# Patient Record
Sex: Female | Born: 1940
Health system: Southern US, Community
[De-identification: ages and names within clinical notes are randomized; demographics above are authoritative.]

## PROBLEM LIST (undated history)

## (undated) DIAGNOSIS — I10 Essential (primary) hypertension: Secondary | ICD-10-CM

## (undated) DIAGNOSIS — D751 Secondary polycythemia: Secondary | ICD-10-CM

## (undated) DIAGNOSIS — C189 Malignant neoplasm of colon, unspecified: Secondary | ICD-10-CM

## (undated) DIAGNOSIS — E7800 Pure hypercholesterolemia, unspecified: Secondary | ICD-10-CM

## (undated) DIAGNOSIS — E041 Nontoxic single thyroid nodule: Secondary | ICD-10-CM

## (undated) DIAGNOSIS — J189 Pneumonia, unspecified organism: Secondary | ICD-10-CM

## (undated) DIAGNOSIS — C760 Malignant neoplasm of head, face and neck: Secondary | ICD-10-CM

## (undated) DIAGNOSIS — D122 Benign neoplasm of ascending colon: Secondary | ICD-10-CM

## (undated) DIAGNOSIS — Z7189 Other specified counseling: Secondary | ICD-10-CM

## (undated) DIAGNOSIS — E785 Hyperlipidemia, unspecified: Secondary | ICD-10-CM

## (undated) DIAGNOSIS — K921 Melena: Secondary | ICD-10-CM

## (undated) DIAGNOSIS — C4442 Squamous cell carcinoma of skin of scalp and neck: Secondary | ICD-10-CM

## (undated) DIAGNOSIS — E042 Nontoxic multinodular goiter: Secondary | ICD-10-CM

## (undated) HISTORY — DX: Melena: K92.1

## (undated) HISTORY — DX: Pure hypercholesterolemia, unspecified: E78.00

## (undated) HISTORY — DX: Hyperlipidemia, unspecified: E78.5

## (undated) HISTORY — DX: Other specified counseling: Z71.89

## (undated) HISTORY — DX: Malignant neoplasm of colon, unspecified: C18.9

## (undated) HISTORY — PX: ABDOMINAL HYSTERECTOMY: SHX81

## (undated) HISTORY — DX: Nontoxic multinodular goiter: E04.2

## (undated) HISTORY — DX: Essential (primary) hypertension: I10

## (undated) HISTORY — DX: Secondary polycythemia: D75.1

## (undated) HISTORY — DX: Nontoxic single thyroid nodule: E04.1

## (undated) HISTORY — DX: Pneumonia, unspecified organism: J18.9

## (undated) HISTORY — DX: Benign neoplasm of ascending colon: D12.2

---

## 2005-03-24 ENCOUNTER — Ambulatory Visit: Payer: Self-pay | Admitting: Unknown Physician Specialty

## 2006-01-13 ENCOUNTER — Ambulatory Visit: Payer: Self-pay | Admitting: Gastroenterology

## 2006-05-12 ENCOUNTER — Ambulatory Visit: Payer: Self-pay | Admitting: Unknown Physician Specialty

## 2007-05-18 ENCOUNTER — Ambulatory Visit: Payer: Self-pay | Admitting: Unknown Physician Specialty

## 2008-01-28 ENCOUNTER — Ambulatory Visit: Payer: Self-pay | Admitting: Ophthalmology

## 2008-02-01 ENCOUNTER — Ambulatory Visit: Payer: Self-pay | Admitting: Unknown Physician Specialty

## 2008-02-16 ENCOUNTER — Ambulatory Visit: Payer: Self-pay | Admitting: Unknown Physician Specialty

## 2008-04-13 ENCOUNTER — Encounter: Admission: RE | Admit: 2008-04-13 | Discharge: 2008-04-13 | Payer: Self-pay | Admitting: Surgery

## 2008-04-13 ENCOUNTER — Encounter (INDEPENDENT_AMBULATORY_CARE_PROVIDER_SITE_OTHER): Payer: Self-pay | Admitting: Interventional Radiology

## 2008-04-13 ENCOUNTER — Other Ambulatory Visit: Admission: RE | Admit: 2008-04-13 | Discharge: 2008-04-13 | Payer: Self-pay | Admitting: Interventional Radiology

## 2008-05-18 ENCOUNTER — Ambulatory Visit: Payer: Self-pay | Admitting: Unknown Physician Specialty

## 2009-06-06 ENCOUNTER — Ambulatory Visit: Payer: Self-pay | Admitting: Unknown Physician Specialty

## 2009-08-18 HISTORY — PX: WRIST FRACTURE SURGERY: SHX121

## 2009-09-05 ENCOUNTER — Ambulatory Visit: Payer: Self-pay | Admitting: Unknown Physician Specialty

## 2010-07-15 ENCOUNTER — Ambulatory Visit: Payer: Self-pay | Admitting: Unknown Physician Specialty

## 2011-04-17 ENCOUNTER — Ambulatory Visit: Payer: Self-pay | Admitting: Gastroenterology

## 2011-07-17 ENCOUNTER — Ambulatory Visit: Payer: Self-pay | Admitting: Unknown Physician Specialty

## 2011-12-15 ENCOUNTER — Telehealth (INDEPENDENT_AMBULATORY_CARE_PROVIDER_SITE_OTHER): Payer: Self-pay | Admitting: Surgery

## 2011-12-22 ENCOUNTER — Other Ambulatory Visit (INDEPENDENT_AMBULATORY_CARE_PROVIDER_SITE_OTHER): Payer: Self-pay

## 2011-12-22 DIAGNOSIS — E041 Nontoxic single thyroid nodule: Secondary | ICD-10-CM

## 2011-12-25 ENCOUNTER — Ambulatory Visit
Admission: RE | Admit: 2011-12-25 | Discharge: 2011-12-25 | Disposition: A | Discharge status: Home / Self Care | Payer: Medicare Other | Source: Ambulatory Visit | Attending: Surgery | Admitting: Surgery

## 2011-12-25 DIAGNOSIS — E041 Nontoxic single thyroid nodule: Secondary | ICD-10-CM

## 2012-01-20 ENCOUNTER — Encounter (INDEPENDENT_AMBULATORY_CARE_PROVIDER_SITE_OTHER): Payer: Self-pay | Admitting: Surgery

## 2012-01-22 ENCOUNTER — Encounter (INDEPENDENT_AMBULATORY_CARE_PROVIDER_SITE_OTHER): Payer: Self-pay | Admitting: Surgery

## 2012-01-22 ENCOUNTER — Ambulatory Visit (INDEPENDENT_AMBULATORY_CARE_PROVIDER_SITE_OTHER): Payer: Medicare Other | Admitting: Surgery

## 2012-01-22 VITALS — BP 124/87 | HR 73 | Temp 97.7°F | Ht 66.0 in | Wt 118.0 lb

## 2012-01-22 DIAGNOSIS — E042 Nontoxic multinodular goiter: Secondary | ICD-10-CM

## 2012-01-22 DIAGNOSIS — E041 Nontoxic single thyroid nodule: Secondary | ICD-10-CM

## 2012-01-22 HISTORY — DX: Nontoxic multinodular goiter: E04.2

## 2012-01-22 NOTE — Progress Notes (Signed)
Visit Diagnoses: 1. Thyroid nodule, uninodular, right     HISTORY: Patient is a 71 year old white female followed for several years with a dominant right thyroid nodule. This has remained relatively stable with only slight interval enlargement. Current ultrasound performed May 2013 shows a 1.6 cm solid inferior right thyroid nodule. Previous fine-needle aspiration biopsy showed benign cytopathology. Patient's thyroid function tests have always been normal. She has never been on thyroid medication. She returns today for followup physical examination and review of her ultrasound results.  PERTINENT REVIEW OF SYSTEMS: Denies tremor. Denies palpitations. Denies dysphagia. Denies dyspnea. Denies mass or discomfort.  EXAM: HEENT: normocephalic; pupils equal and reactive; sclerae clear; dentition good; mucous membranes moist NECK:  Palpable subtle nodule right inferior pole, nontender, mobile with swallowing; no palpable nodule on the left; symmetric on extension; no palpable anterior or posterior cervical lymphadenopathy; no supraclavicular masses; no tenderness CHEST: clear to auscultation bilaterally without rales, rhonchi, or wheezes CARDIAC: regular rate and rhythm without significant murmur; peripheral pulses are full EXT:  non-tender without edema; no deformity NEURO: no gross focal deficits; no sign of tremor   IMPRESSION: Dominant right thyroid nodule, 1.6 cm, clinically stable  PLAN: Patient I reviewed the ultrasound results. We discussed options for management. She agrees with continued observation. We will repeat her ultrasound in one year. If her ultrasound is stable at that time, we may consider going to an examination every other year.  Velora Heckler, MD, FACS General & Endocrine Surgery [facility]

## 2012-07-21 ENCOUNTER — Ambulatory Visit: Payer: Self-pay | Admitting: Unknown Physician Specialty

## 2013-01-03 ENCOUNTER — Telehealth (INDEPENDENT_AMBULATORY_CARE_PROVIDER_SITE_OTHER): Payer: Self-pay | Admitting: General Surgery

## 2013-01-03 NOTE — Telephone Encounter (Signed)
Letter mailed for u/s and June office visit for Dr Dewitt Rota

## 2013-01-06 ENCOUNTER — Telehealth (INDEPENDENT_AMBULATORY_CARE_PROVIDER_SITE_OTHER): Payer: Self-pay | Admitting: General Surgery

## 2013-01-06 NOTE — Telephone Encounter (Signed)
Spoke to patient she is aware of appt at  [address]   01/11/13 2pm

## 2013-01-06 NOTE — Telephone Encounter (Signed)
Patient calling stating she called to set up her thyroid ultrasound and they would not let her set this up. She states she can go anytime in the afternoons. Order printed and given to referral coordinator.

## 2013-01-11 ENCOUNTER — Ambulatory Visit
Admission: RE | Admit: 2013-01-11 | Discharge: 2013-01-11 | Disposition: A | Discharge status: Home / Self Care | Payer: Medicare Other | Source: Ambulatory Visit | Attending: Surgery | Admitting: Surgery

## 2013-01-11 DIAGNOSIS — E041 Nontoxic single thyroid nodule: Secondary | ICD-10-CM

## 2013-01-31 ENCOUNTER — Ambulatory Visit (INDEPENDENT_AMBULATORY_CARE_PROVIDER_SITE_OTHER): Payer: Medicare Other | Admitting: Surgery

## 2013-01-31 ENCOUNTER — Encounter (INDEPENDENT_AMBULATORY_CARE_PROVIDER_SITE_OTHER): Payer: Self-pay | Admitting: Surgery

## 2013-01-31 VITALS — BP 122/72 | HR 76 | Temp 97.0°F | Resp 16 | Ht 65.0 in | Wt 114.0 lb

## 2013-01-31 DIAGNOSIS — E041 Nontoxic single thyroid nodule: Secondary | ICD-10-CM

## 2013-01-31 NOTE — Patient Instructions (Signed)
Thyroid Diseases  Your thyroid is a butterfly-shaped gland in your neck. It is located just above your collarbone. It is one of your endocrine glands, which make hormones. The thyroid helps set your metabolism. Metabolism is how your body gets energy from the foods you eat.   Millions of people have thyroid diseases. Women experience thyroid problems more often than men. In fact, overactive thyroid problems (hyperthyroidism) occur in 1% of all women. If you have a thyroid disease, your body may use energy more slowly or quickly than it should.   Thyroid problems also include an immune disease where your body reacts against your thyroid gland (called thyroiditis). A different problem involves lumps and bumps (called nodules) that develop in the gland. The nodules are usually, but not always, noncancerous.  THE MOST COMMON THYROID PROBLEMS AND CAUSES ARE DISCUSSED BELOW  There are many causes for thyroid problems. Treatment depends upon the exact diagnosis and includes trying to reset your body's metabolism to a normal rate.  Hyperthyroidism  Too much thyroid hormone from an overactive thyroid gland is called hyperthyroidism. In hyperthyroidism, the body's metabolism speeds up. One of the most frequent forms of hyperthyroidism is known as Graves' disease. Graves' disease tends to run in families. Although Graves' is thought to be caused by a problem with the immune system, the exact nature of the genetic problem is unknown.  Hypothyroidism  Too little thyroid hormone from an underactive thyroid gland is called hypothyroidism. In hypothyroidism, the body's metabolism is slowed. Several things can cause this condition. Most causes affect the thyroid gland directly and hurt its ability to make enough hormone.   Rarely, there may be a pituitary gland tumor (located near the base of the brain). The tumor can block the pituitary from producing thyroid-stimulating hormone (TSH). Your body makes TSH to stimulate the thyroid  to work properly. If the pituitary does not make enough TSH, the thyroid fails to make enough hormones needed for good health.  Whether the problem is caused by thyroid conditions or by the pituitary gland, the result is that the thyroid is not making enough hormones. Hypothyroidism causes many physical and mental processes to become sluggish. The body consumes less oxygen and produces less body heat.  Thyroid Nodules  A thyroid nodule is a small swelling or lump in the thyroid gland. They are common. These nodules represent either a growth of thyroid tissue or a fluid-filled cyst. Both form a lump in the thyroid gland. Almost half of all people will have tiny thyroid nodules at some point in their lives. Typically, these are not noticeable until they become large and affect normal thyroid size. Larger nodules that are greater than a half inch across (about 1 centimeter) occur in about 5 percent of people.  Although most nodules are not cancerous, people who have them should seek medical care to rule out cancer. Also, some thyroid nodules may produce too much thyroid hormone or become too large. Large nodules or a large gland can interfere with breathing or swallowing or may cause neck discomfort.  Other problems  Other thyroid problems include cancer and thyroiditis. Thyroiditis is a malfunction of the body's immune system. Normally, the immune system works to defend the body against infection and other problems. When the immune system is not working properly, it may mistakenly attack normal cells, tissues, and organs. Examples of autoimmune diseases are Hashimoto's thyroiditis (which causes low thyroid function) and Graves' disease (which causes excess thyroid function).  SYMPTOMS   Symptoms   vary greatly depending upon the exact type of problem with the thyroid.  Hyperthyroidism-is when your thyroid is too active and makes more thyroid hormone than your body needs. The most common cause is Graves' Disease. Too  much thyroid hormone can cause some or all of the following symptoms:  · Anxiety.  · Irritability.  · Difficulty sleeping.  · Fatigue.  · A rapid or irregular heartbeat.  · A fine tremor of your hands or fingers.  · An increase in perspiration.  · Sensitivity to heat.  · Weight loss, despite normal food intake.  · Brittle hair.  · Enlargement of your thyroid gland (goiter).  · Light menstrual periods.  · Frequent bowel movements.  Graves' disease can specifically cause eye and skin problems. The skin problems involve reddening and swelling of the skin, often on your shins and on the top of your feet. Eye problems can include the following:  · Excess tearing and sensation of grit or sand in either or both eyes.  · Reddened or inflamed eyes.  · Widening of the space between your eyelids.  · Swelling of the lids and tissues around the eyes.  · Light sensitivity.  · Ulcers on the cornea.  · Double vision.  · Limited eye movements.  · Blurred or reduced vision.  Hypothyroidism- is when your thyroid gland is not active enough. This is more common than hyperthyroidism. Symptoms can vary a lot depending of the severity of the hormone deficiency. Symptoms may develop over a long period of time and can include several of the following:  · Fatigue.  · Sluggishness.  · Increased sensitivity to cold.  · Constipation.  · Pale, dry skin.  · A puffy face.  · Hoarse voice.  · High blood cholesterol level.  · Unexplained weight gain.  · Muscle aches, tenderness and stiffness.  · Pain, stiffness or swelling in your joints.  · Muscle weakness.  · Heavier than normal menstrual periods.  · Brittle fingernails and hair.  · Depression.  Thyroid Nodules - most do not cause signs or symptoms. Occasionally, some may become so large that you can feel or even see the swelling at the base of your neck. You may realize a lump or swelling is there when you are shaving or putting on makeup. Men might become aware of a nodule when shirt collars  suddenly feel too tight.  Some nodules produce too much thyroid hormone. This can produce the same symptoms as hyperthyroidism (see above).  Thyroid nodules are seldom cancerous. However, a nodule is more likely to be malignant (cancerous) if it:  · Grows quickly or feels hard.  · Causes you to become hoarse or to have trouble swallowing or breathing.  · Causes enlarged lymph nodes under your jaw or in your neck.  DIAGNOSIS   Because there are so many possible thyroid conditions, your caregiver may ask for a number of tests. They will do this in order to narrow down the exact diagnosis. These tests can include:  · Blood and antibody tests.  · Special thyroid scans using small, safe amounts of radioactive iodine.  · Ultrasound of the thyroid gland (particularly if there is a nodule or lump).  · Biopsy. This is usually done with a special needle. A needle biopsy is a procedure to obtain a sample of cells from the thyroid. The tissue will be tested in a lab and examined under a microscope.  TREATMENT   Treatment depends on the exact diagnosis.    Hyperthyroidism  · Beta-blockers help relieve many of the symptoms.  · Anti-thyroid medications prevent the thyroid from making excess hormones.  · Radioactive iodine treatment can destroy overactive thyroid cells. The iodine can permanently decrease the amount of hormone produced.  · Surgery to remove the thyroid gland.  · Treatments for eye problems that come from Graves' disease also include medications and special eye surgery, if felt to be appropriate.  Hypothyroidism  Thyroid replacement with levothyroxine is the mainstay of treatment. Treatment with thyroid replacement is usually lifelong and will require monitoring and adjustment from time to time.  Thyroid Nodules  · Watchful waiting. If a small nodule causes no symptoms or signs of cancer on biopsy, then no treatment may be chosen at first. Re-exam and re-checking blood tests would be the recommended  follow-up.  · Anti-thyroid medications or radioactive iodine treatment may be recommended if the nodules produce too much thyroid hormone (see Treatment for Hyperthyroidism above).  · Alcohol ablation. Injections of small amounts of ethyl alcohol (ethanol) can cause a non-cancerous nodule to shrink in size.  · Surgery (see Treatment for Hyperthyroidism above).  HOME CARE INSTRUCTIONS   · Take medications as instructed.  · Follow through on recommended testing.  SEEK MEDICAL CARE IF:   · You feel that you are developing symptoms of Hyperthyroidism or Hypothyroidism as described above.  · You develop a new lump/nodule in the neck/thyroid area that you had not noticed before.  · You feel that you are having side effects from medicines prescribed.  · You develop trouble breathing or swallowing.  SEEK IMMEDIATE MEDICAL CARE IF:   · You develop a fever of 102° F (38.9° C) or higher.  · You develop severe sweating.  · You develop palpitations and/or rapid heart beat.  · You develop shortness of breath.  · You develop nausea and vomiting.  · You develop extreme shakiness.  · You develop agitation.  · You develop lightheadedness or have a fainting episode.  Document Released: 06/01/2007 Document Revised: 10/27/2011 Document Reviewed: 06/01/2007  [employer]® Patient Information ©2014 [employer].

## 2013-01-31 NOTE — Progress Notes (Signed)
General Surgery [facility]  Visit Diagnoses: 1. Thyroid nodule, uninodular, right     HISTORY: Patient is a 72 year old female followed in my practice for many years with small thyroid goiter and a right thyroid nodule. This has been followed with sequential ultrasound scanning. Previous fine-needle aspiration had shown benign cytopathology. Thyroid function is followed by her primary care physician. She has not required thyroid hormone supplementation.  Thyroid ultrasound performed 01/11/2013 shows a slightly enlarged thyroid gland with the right lobe measuring 5.2 cm and left lobe measuring 5.6 cm. Dominant nodule in the right inferior pole measures 1.7 cm and is unchanged from prior examination. There is also a 4 mm nodule in the upper pole of the left thyroid lobe without concerning features.  PERTINENT REVIEW OF SYSTEMS: Patient denies any change in self-examination. Rare episodes of dysphagia. No new probable masses.  EXAM: HEENT: normocephalic; pupils equal and reactive; sclerae clear; dentition fair; mucous membranes moist NECK:  Thyroid gland is mildly firm, slightly nodular, without discrete or dominant mass; nodule in right lower pole is soft and difficult to appreciate on physical exam; symmetric on extension; no palpable anterior or posterior cervical lymphadenopathy; no supraclavicular masses; no tenderness CHEST: clear to auscultation bilaterally without rales, rhonchi, or wheezes CARDIAC: regular rate and rhythm without significant murmur; peripheral pulses are full EXT:  non-tender without edema; no deformity NEURO: no gross focal deficits; no sign of tremor   IMPRESSION: #1 small thyroid goiter #2 multiple thyroid nodules, clinically stable  PLAN: Patient has been followed for several years with a stable clinical examination. I would like to repeat her thyroid ultrasound in 2 years. We will make arrangements for this study and for an office visit to  follow that procedure.  Patient will continue to be followed by her primary care physician. They will monitor her TSH level.  Patient will return in 2 years.  Velora Heckler, MD, [facility] Office: [phone]

## 2013-07-22 ENCOUNTER — Ambulatory Visit: Payer: Self-pay | Admitting: Internal Medicine

## 2014-06-13 DIAGNOSIS — E785 Hyperlipidemia, unspecified: Secondary | ICD-10-CM

## 2014-06-13 DIAGNOSIS — I10 Essential (primary) hypertension: Secondary | ICD-10-CM | POA: Insufficient documentation

## 2014-06-13 HISTORY — DX: Hyperlipidemia, unspecified: E78.5

## 2014-06-13 HISTORY — DX: Essential (primary) hypertension: I10

## 2014-07-24 ENCOUNTER — Ambulatory Visit: Payer: Self-pay | Admitting: Internal Medicine

## 2014-11-22 ENCOUNTER — Other Ambulatory Visit: Payer: Self-pay | Admitting: Surgery

## 2014-11-22 DIAGNOSIS — E041 Nontoxic single thyroid nodule: Secondary | ICD-10-CM

## 2014-11-23 ENCOUNTER — Other Ambulatory Visit: Payer: Self-pay | Admitting: Surgery

## 2014-11-23 DIAGNOSIS — E041 Nontoxic single thyroid nodule: Secondary | ICD-10-CM

## 2014-12-14 DIAGNOSIS — E041 Nontoxic single thyroid nodule: Secondary | ICD-10-CM | POA: Insufficient documentation

## 2014-12-26 ENCOUNTER — Inpatient Hospital Stay: Payer: Commercial Managed Care - HMO | Attending: Internal Medicine | Admitting: Internal Medicine

## 2014-12-26 ENCOUNTER — Inpatient Hospital Stay: Payer: Commercial Managed Care - HMO | Admitting: *Deleted

## 2014-12-26 VITALS — BP 165/78 | HR 69 | Temp 96.7°F | Ht 64.0 in | Wt 118.2 lb

## 2014-12-26 DIAGNOSIS — Z79899 Other long term (current) drug therapy: Secondary | ICD-10-CM | POA: Insufficient documentation

## 2014-12-26 DIAGNOSIS — R5383 Other fatigue: Secondary | ICD-10-CM | POA: Insufficient documentation

## 2014-12-26 DIAGNOSIS — M810 Age-related osteoporosis without current pathological fracture: Secondary | ICD-10-CM | POA: Diagnosis not present

## 2014-12-26 DIAGNOSIS — E785 Hyperlipidemia, unspecified: Secondary | ICD-10-CM | POA: Diagnosis not present

## 2014-12-26 DIAGNOSIS — D751 Secondary polycythemia: Secondary | ICD-10-CM

## 2014-12-26 DIAGNOSIS — Z87891 Personal history of nicotine dependence: Secondary | ICD-10-CM | POA: Insufficient documentation

## 2014-12-26 DIAGNOSIS — R531 Weakness: Secondary | ICD-10-CM | POA: Diagnosis not present

## 2014-12-26 DIAGNOSIS — I10 Essential (primary) hypertension: Secondary | ICD-10-CM | POA: Insufficient documentation

## 2014-12-26 DIAGNOSIS — J449 Chronic obstructive pulmonary disease, unspecified: Secondary | ICD-10-CM | POA: Insufficient documentation

## 2014-12-26 HISTORY — DX: Secondary polycythemia: D75.1

## 2014-12-26 LAB — IRON AND TIBC
IRON: 63 ug/dL (ref 28–170)
Saturation Ratios: 20 % (ref 10.4–31.8)
TIBC: 311 ug/dL (ref 250–450)
UIBC: 248 ug/dL

## 2014-12-26 LAB — CBC
HEMATOCRIT: 45.5 % (ref 35.0–47.0)
HEMOGLOBIN: 14.8 g/dL (ref 12.0–16.0)
MCH: 30 pg (ref 26.0–34.0)
MCHC: 32.5 g/dL (ref 32.0–36.0)
MCV: 92.2 fL (ref 80.0–100.0)
PLATELETS: 214 10*3/uL (ref 150–440)
RBC: 4.93 MIL/uL (ref 3.80–5.20)
RDW: 12.4 % (ref 11.5–14.5)
WBC: 9.4 10*3/uL (ref 3.6–11.0)

## 2014-12-26 LAB — FERRITIN: FERRITIN: 41 ng/mL (ref 11–307)

## 2014-12-26 NOTE — Progress Notes (Signed)
Patient is referred here by Dr. Candiss Norse for elevated hematocrit. Patient states that she feels dizzy sometimes, especially if she has been standing for a long time.

## 2014-12-27 ENCOUNTER — Other Ambulatory Visit: Payer: Self-pay

## 2014-12-27 ENCOUNTER — Other Ambulatory Visit: Payer: Self-pay | Admitting: Internal Medicine

## 2014-12-27 ENCOUNTER — Other Ambulatory Visit: Payer: Self-pay | Admitting: *Deleted

## 2014-12-27 ENCOUNTER — Telehealth: Payer: Self-pay

## 2014-12-27 DIAGNOSIS — D751 Secondary polycythemia: Secondary | ICD-10-CM

## 2014-12-27 LAB — MISC LABCORP TEST (SEND OUT): Labcorp test code: 7187

## 2014-12-27 NOTE — Telephone Encounter (Signed)
Called patient and informed her that her HCT was 45.5 and that she does not need a phlebotomy. I also informed her that Dr. Ma Hillock is entering future appointments and the schedulers will schedule and call her with appointments.

## 2014-12-28 LAB — ERYTHROPOIETIN: ERYTHROPOIETIN: 9.6 m[IU]/mL (ref 2.6–18.5)

## 2014-12-29 ENCOUNTER — Encounter: Payer: Self-pay | Admitting: Internal Medicine

## 2014-12-29 NOTE — Progress Notes (Signed)
[place]  [phone] Fax:([phone]) [phone]     ID: Dana Perez OB: 01/14/1941  MR#: [id number]  ZHY#:[id number]  Patient Care Team: Lorenza Evangelist, MD as PCP - General (Unknown Physician Specialty)  CHIEF COMPLAINT/DIAGNOSIS:  Persistent Erythrocytosis  (labs on 12/14/2014 showed hematocrit 45.2%, hemoglobin 15.5, WBC 8800, 56% neutrophils, 38% lymphocytes, 5% monocytes, platelets 247. In February 2016, hemoglobin 15.5, hematocrit 46%. In November 2015, hemoglobin 16.0 and hematocrit 47.6%).  -  Patient referred here for hematology evaluation and management.   HISTORY OF PRESENT ILLNESS: Dana Perez is a 74 year old female with past medical history significant for hypertension, hyperlipidemia, thyroid nodule, elevated hematocrit, COPD (noted on CT chest June 2009, worked in a [employer], former smoker 8 cigarettes per day 10 years quit 7 years ago), osteopenia, drooping eyelid right status post repair, shingles, rotator cuff syndrome left, hysterectomy and nephrectomy, left distal radius fracture status post open reduction 2011 has been definite for persistent elevation of hemoglobin and hematocrit. As stated above, CBCs done since November 2015 had shown elevated hematocrit and persistently elevated hemoglobin and bone normal range. Otherwise WBC and platelet count has remained in the normal range. Patient states that she quit smoking about 10 years ago. She denies any known history of thromboembolic phenomena including deep venous thrombosis, pulmonary embolism, TIA, stroke or heart attack. No fevers or night sweats. No skin rash of pruritus. She does not use home oxygen. Denies any history of sleep apnea. Appetite is good, no unintentional weight loss.  REVIEW OF SYSTEMS:   Review of Systems  Constitutional: Positive for malaise/fatigue.  HENT: Positive for hearing loss. Negative for nosebleeds.   Respiratory: Positive for shortness of breath.     Cardiovascular: Negative.   Gastrointestinal: Negative.   Genitourinary: Negative.   Musculoskeletal: Positive for joint pain.  Skin: Negative.   Neurological: Negative.   Endo/Heme/Allergies: Negative.     Otherwise, a complete review of systems is negatve.  PAST MEDICAL HISTORY: Past Medical History  Diagnosis Date  . Osteoporosis     PAST SURGICAL HISTORY: Past Surgical History  Procedure Laterality Date  . Abdominal hysterectomy    . Wrist fracture surgery  08/2009    badly broken    FAMILY HISTORY Family History  Problem Relation Age of Onset  . Heart disease Mother   . Diabetes Father     Social History - quit smoking 10 years ago (8 cigarettes per day 10 years). Denies alcohol or recreational drug usage. Physically active.     No Known Allergies  Current Outpatient Prescriptions  Medication Sig Dispense Refill  . amLODipine (NORVASC) 2.5 MG tablet Take 2.5 mg by mouth daily.    . Calcium Carbonate-Vitamin D (CALCIUM-VITAMIN D) 500-200 MG-UNIT per tablet Take 2 tablets by mouth daily.    . pravastatin (PRAVACHOL) 10 MG tablet Take 10 mg by mouth daily.     No current facility-administered medications for this visit.    OBJECTIVE: Filed Vitals:   12/26/14 1419  BP: 165/78  Pulse: 69  Temp: 96.7 F (35.9 C)     Body mass index is 20.27 kg/(m^2).      General: Patient is moderately built thin individual, alert and oriented, in no acute distress. Eyes: Pink conjunctiva, anicteric sclera. HEENT: Normocephalic, moist mucous membranes, clear oropharnyx. Lungs: Bilateral diminished breath sounds overall, no crepitations are rhonchi Heart: Regular rate and rhythm. No murmurs. Abdomen: Soft, nontender. No organomegaly noted, normoactive bowel sounds. Musculoskeletal: No pedal edema or cyanosis Neuro:  Grossly nonfocal. Cranial nerves grossly intact. Skin: No rashes or petechiae noted. Psych: Normal affect. Lymphatics: No axillary or inguinal LAD.   LAB  RESULTS: 12/14/2014 - hematocrit 45.2%, hemoglobin 15.5, WBC 8800, 56% neutrophils, 38% lymphocytes, 5% monocytes, platelets 247. In February 2016, hemoglobin 15.5, hematocrit 46%. In November 2015, hemoglobin 16.0 and hematocrit 47.6%)  ASSESSMENT/PLAN:   Persistent Erythrocytosis  (labs on 12/14/2014 showed hematocrit 45.2%, hemoglobin 15.5, WBC 8800, 56% neutrophils, 38% lymphocytes, 5% monocytes, platelets 247. In February 2016, hemoglobin 15.5, hematocrit 46%. In November 2015, hemoglobin 16.0 and hematocrit 47.6%) -  Patient referred here for hematology evaluation and management. Have reviewed records sent by referring physician, also reviewed prior labs done and explained to patient that hemoglobin and hematocrit values are abnormally high. Most likely explanation would be history of chronic obstructive pulmonary disease (COPD), otherwise she has not smoked cigarettes for 10 years now. Plan is to get labs today to evaluate for etiology of erythrocytosis, accordingly will get CBC with differential, iron study, serum erythropoietin level, blood Carboxyhemoglobin level, [id number] mutation with reflex to exon 12 analysis to look for evidence of polycythemia vera. Have explained to patient that high hemoglobin/hematocrit can increase the risk of thromboembolic phenomenon, and we will need to pursue phlebotomy to try and maintain it within the normal range. If hematocrit is 46 or higher, we will pursue phlebotomy 350 mL. We will repeat hematocrit in 1 week and pursue phlebotomy as indicated. We will see her back in 2 weeks with hematocrit and make further plan of management.  In between visits, patient advised to call or come to ER in case of any new symptoms or acute sickness. She is agreeable to this plan.  Leia Alf, MD   12/29/2014 3:42 PM

## 2015-01-02 ENCOUNTER — Inpatient Hospital Stay: Payer: Commercial Managed Care - HMO

## 2015-01-02 ENCOUNTER — Ambulatory Visit: Payer: Commercial Managed Care - HMO | Admitting: Internal Medicine

## 2015-01-02 ENCOUNTER — Ambulatory Visit: Payer: Commercial Managed Care - HMO

## 2015-01-03 ENCOUNTER — Ambulatory Visit
Admission: RE | Admit: 2015-01-03 | Discharge: 2015-01-03 | Disposition: A | Discharge status: Home / Self Care | Payer: Commercial Managed Care - HMO | Source: Ambulatory Visit | Attending: Surgery | Admitting: Surgery

## 2015-01-04 LAB — MISC LABCORP TEST (SEND OUT): LABCORP TEST CODE: 489395

## 2015-01-18 ENCOUNTER — Other Ambulatory Visit: Payer: Self-pay

## 2015-01-19 ENCOUNTER — Inpatient Hospital Stay: Payer: Commercial Managed Care - HMO | Attending: Internal Medicine

## 2015-01-19 ENCOUNTER — Inpatient Hospital Stay: Payer: Commercial Managed Care - HMO

## 2015-01-19 ENCOUNTER — Inpatient Hospital Stay (HOSPITAL_BASED_OUTPATIENT_CLINIC_OR_DEPARTMENT_OTHER): Payer: Commercial Managed Care - HMO | Admitting: Internal Medicine

## 2015-01-19 VITALS — BP 187/70 | HR 62 | Temp 96.6°F | Resp 18 | Ht 64.0 in | Wt 117.3 lb

## 2015-01-19 DIAGNOSIS — M2550 Pain in unspecified joint: Secondary | ICD-10-CM | POA: Diagnosis not present

## 2015-01-19 DIAGNOSIS — R5381 Other malaise: Secondary | ICD-10-CM | POA: Diagnosis not present

## 2015-01-19 DIAGNOSIS — J449 Chronic obstructive pulmonary disease, unspecified: Secondary | ICD-10-CM | POA: Diagnosis not present

## 2015-01-19 DIAGNOSIS — R5383 Other fatigue: Secondary | ICD-10-CM

## 2015-01-19 DIAGNOSIS — D751 Secondary polycythemia: Secondary | ICD-10-CM | POA: Diagnosis present

## 2015-01-19 DIAGNOSIS — Z79899 Other long term (current) drug therapy: Secondary | ICD-10-CM

## 2015-01-19 DIAGNOSIS — Z87891 Personal history of nicotine dependence: Secondary | ICD-10-CM | POA: Insufficient documentation

## 2015-01-19 DIAGNOSIS — M810 Age-related osteoporosis without current pathological fracture: Secondary | ICD-10-CM | POA: Insufficient documentation

## 2015-01-19 DIAGNOSIS — H9190 Unspecified hearing loss, unspecified ear: Secondary | ICD-10-CM | POA: Diagnosis not present

## 2015-01-19 DIAGNOSIS — R0602 Shortness of breath: Secondary | ICD-10-CM

## 2015-01-19 LAB — HEMATOCRIT: HEMATOCRIT: 47.2 % — AB (ref 35.0–47.0)

## 2015-02-06 NOTE — Progress Notes (Signed)
[place]  Telephone:(336) [phone] Fax:(336) [phone]     ID: Dana Perez OB: 1941-08-02  MR#: [id number]  BHA#:[id number]  Patient Care Team: Lorenza Evangelist, MD as PCP - General (Unknown Physician Specialty)  CHIEF COMPLAINT/DIAGNOSIS:  Persistent Erythrocytosis likely secondary to h/o chronic obstructive pulmonary disease (COPD), otherwise quit smoking ~ 10 years ago.  Workup on 12/26/14 -  Hb 14.8, Hct 45.5%, WBC 9.4, plts 214, serum erythropoietin level is 9.6, blood Carboxyhemoglobin level 3.5%, [id number] mutation and Exon 12 analysis both negative. (prior labs on 12/14/14 showed hematocrit 45.2%, hemoglobin 15.5, WBC 8800, 56% neutrophils, 38% lymphocytes, 5% monocytes, platelets 247. In February 2016, hemoglobin 15.5, hematocrit 46%. In November 2015, hemoglobin 16.0 and hematocrit 47.6%).  HISTORY OF PRESENT ILLNESS: Patient returns for hematology f/u.  States that she is doing well. She denies any known history of thromboembolic phenomena including deep venous thrombosis, pulmonary embolism, TIA, stroke or heart attack. No fevers or night sweats. No skin rash of pruritus. She does not use home oxygen. Denies any history of sleep apnea. Appetite is good.  REVIEW OF SYSTEMS:   Review of Systems  Constitutional: Positive for malaise/fatigue.  HENT: Positive for hearing loss. Negative for nosebleeds.   Respiratory: Positive for shortness of breath.   Cardiovascular: Negative.   Gastrointestinal: Negative.   Genitourinary: Negative.   Musculoskeletal: Positive for joint pain.  Skin: Negative.   Neurological: Negative.   Endo/Heme/Allergies: Negative.    PAST MEDICAL HISTORY: Past Medical History  Diagnosis Date  . Osteoporosis     PAST SURGICAL HISTORY: Past Surgical History  Procedure Laterality Date  . Abdominal hysterectomy    . Wrist fracture surgery  08/2009    badly broken    FAMILY HISTORY Family History  Problem Relation Age of Onset    . Heart disease Mother   . Diabetes Father     Social History - quit smoking 10 years ago (8 cigarettes per day 10 years). Denies alcohol or recreational drug usage. Physically active.     No Known Allergies  Current Outpatient Prescriptions  Medication Sig Dispense Refill  . amLODipine (NORVASC) 2.5 MG tablet Take 2.5 mg by mouth daily.    . Calcium Carbonate-Vitamin D (CALCIUM-VITAMIN D) 500-200 MG-UNIT per tablet Take 2 tablets by mouth daily.    . pravastatin (PRAVACHOL) 10 MG tablet Take 10 mg by mouth daily.     No current facility-administered medications for this visit.    PHYSICAL EXAM: Filed Vitals:   01/19/15 1146  BP:   Pulse:   Temp:   Resp: 18     Body mass index is 20.12 kg/(m^2).      General: Patient is alert and oriented, in no acute distress. HEENT: EOMs intact. No facial flushing. Lungs: Bilateral diminished breath sounds overall, no crepitations are rhonchi Heart: S1S2, regular rate and rhythm.   Abdomen: Soft, nontender.  Ext: No pedal edema or cyanosis  LAB RESULTS:  12/14/2014 - hematocrit 45.2%, hemoglobin 15.5, WBC 8800, 56% neutrophils, 38% lymphocytes, 5% monocytes, platelets 247. In February 2016, hemoglobin 15.5, hematocrit 46%. In November 2015, hemoglobin 16.0 and hematocrit 47.6%) Today - Hct 47.2%.  ASSESSMENT/PLAN:   Persistent Erythrocytosis likely secondary to h/o chronic obstructive pulmonary disease (COPD), otherwise quit smoking ~ 10 years ago. Workup on 12/26/14 - Hb 14.8, Hct 45.5%, WBC 9.4, plts 214, serum erythropoietin level is 9.6, blood Carboxyhemoglobin level 3.5%, [id number] mutation and Exon 12 analysis both negative -  reviewed recent workup done and explained  to patient in detail. Most likely explanation would be history of chronic obstructive pulmonary disease (COPD), otherwise she has not smoked cigarettes for 10 years now. COHb mildly elevated and therefore advised to avoid any cigarette smoke exposure including  second-had smoking, and to look for other possible sources of CO exposure, she is agreeable. Hct is 47.2% today, will pursue phlebotomy 350 mL to try and maintain it within the normal range. Monitor Hct q 3 weekly and pursue phlebotomy 350 mL if it is 46 or higher. Will see her back in 24 weeks with CBC and make further plan of management.  In between visits, patient advised to call or come to ER in case of any new symptoms or acute sickness. She is agreeable to this plan.  Leia Alf, MD   02/06/2015 8:29 PM

## 2015-02-08 ENCOUNTER — Other Ambulatory Visit: Payer: Self-pay

## 2015-02-08 DIAGNOSIS — D751 Secondary polycythemia: Secondary | ICD-10-CM

## 2015-02-09 ENCOUNTER — Inpatient Hospital Stay: Payer: Commercial Managed Care - HMO

## 2015-02-09 VITALS — BP 186/70 | HR 74 | Resp 20

## 2015-02-09 DIAGNOSIS — D751 Secondary polycythemia: Secondary | ICD-10-CM

## 2015-02-09 LAB — HEMATOCRIT: HCT: 46.9 % (ref 35.0–47.0)

## 2015-02-09 MED ORDER — SODIUM CHLORIDE 0.9 % IJ SOLN
10.0000 mL | INTRAMUSCULAR | Status: AC | PRN
  Filled 2015-02-09: qty 10

## 2015-02-09 MED ORDER — HEPARIN SOD (PORK) LOCK FLUSH 100 UNIT/ML IV SOLN: 500.0000 [IU] | INTRAVENOUS | Status: AC | PRN

## 2015-02-09 MED ORDER — HEPARIN SOD (PORK) LOCK FLUSH 100 UNIT/ML IV SOLN: 250.0000 [IU] | INTRAVENOUS | Status: AC | PRN

## 2015-02-09 MED ORDER — ALTEPLASE 2 MG IJ SOLR: 2.0000 mg | Freq: Once | INTRAMUSCULAR | Status: AC | PRN

## 2015-02-09 MED ORDER — ANTICOAGULANT SODIUM CITRATE 4% (200MG/5ML) IV SOLN: 5.0000 mL | Status: DC | PRN

## 2015-03-02 ENCOUNTER — Inpatient Hospital Stay: Payer: Commercial Managed Care - HMO

## 2015-03-02 ENCOUNTER — Other Ambulatory Visit: Payer: Self-pay

## 2015-03-02 ENCOUNTER — Inpatient Hospital Stay: Payer: Commercial Managed Care - HMO | Attending: Internal Medicine

## 2015-03-02 DIAGNOSIS — D751 Secondary polycythemia: Secondary | ICD-10-CM

## 2015-03-02 LAB — HEMATOCRIT: HCT: 43.6 % (ref 35.0–47.0)

## 2015-03-15 DIAGNOSIS — E7800 Pure hypercholesterolemia, unspecified: Secondary | ICD-10-CM | POA: Insufficient documentation

## 2015-03-15 HISTORY — DX: Pure hypercholesterolemia, unspecified: E78.00

## 2015-03-23 ENCOUNTER — Inpatient Hospital Stay: Payer: Commercial Managed Care - HMO | Attending: Internal Medicine

## 2015-03-23 ENCOUNTER — Inpatient Hospital Stay: Payer: Commercial Managed Care - HMO

## 2015-03-23 ENCOUNTER — Other Ambulatory Visit: Payer: Self-pay

## 2015-03-23 DIAGNOSIS — Z87891 Personal history of nicotine dependence: Secondary | ICD-10-CM | POA: Insufficient documentation

## 2015-03-23 DIAGNOSIS — J449 Chronic obstructive pulmonary disease, unspecified: Secondary | ICD-10-CM | POA: Diagnosis not present

## 2015-03-23 DIAGNOSIS — D751 Secondary polycythemia: Secondary | ICD-10-CM

## 2015-03-23 LAB — HEMATOCRIT: HCT: 44.4 % (ref 35.0–47.0)

## 2015-04-13 ENCOUNTER — Inpatient Hospital Stay: Payer: Commercial Managed Care - HMO

## 2015-04-13 ENCOUNTER — Other Ambulatory Visit: Payer: Self-pay

## 2015-04-13 DIAGNOSIS — D751 Secondary polycythemia: Secondary | ICD-10-CM

## 2015-04-13 LAB — HEMATOCRIT: HEMATOCRIT: 44 % (ref 35.0–47.0)

## 2015-05-04 ENCOUNTER — Inpatient Hospital Stay: Payer: Commercial Managed Care - HMO

## 2015-05-11 ENCOUNTER — Inpatient Hospital Stay: Payer: Commercial Managed Care - HMO

## 2015-05-11 ENCOUNTER — Inpatient Hospital Stay: Payer: Commercial Managed Care - HMO | Attending: Internal Medicine

## 2015-05-11 DIAGNOSIS — J449 Chronic obstructive pulmonary disease, unspecified: Secondary | ICD-10-CM | POA: Insufficient documentation

## 2015-05-11 DIAGNOSIS — Z87891 Personal history of nicotine dependence: Secondary | ICD-10-CM | POA: Diagnosis not present

## 2015-05-11 DIAGNOSIS — D751 Secondary polycythemia: Secondary | ICD-10-CM | POA: Diagnosis not present

## 2015-05-11 LAB — HEMATOCRIT: HCT: 44.6 % (ref 35.0–47.0)

## 2015-05-25 ENCOUNTER — Inpatient Hospital Stay: Payer: Commercial Managed Care - HMO

## 2015-06-01 ENCOUNTER — Inpatient Hospital Stay: Payer: Commercial Managed Care - HMO | Attending: Family Medicine

## 2015-06-01 ENCOUNTER — Inpatient Hospital Stay: Payer: Commercial Managed Care - HMO

## 2015-06-01 DIAGNOSIS — J449 Chronic obstructive pulmonary disease, unspecified: Secondary | ICD-10-CM | POA: Diagnosis not present

## 2015-06-01 DIAGNOSIS — D751 Secondary polycythemia: Secondary | ICD-10-CM | POA: Diagnosis present

## 2015-06-01 DIAGNOSIS — Z87891 Personal history of nicotine dependence: Secondary | ICD-10-CM | POA: Insufficient documentation

## 2015-06-01 LAB — HEMATOCRIT: HCT: 47.1 % — ABNORMAL HIGH (ref 35.0–47.0)

## 2015-06-15 ENCOUNTER — Inpatient Hospital Stay: Payer: Commercial Managed Care - HMO

## 2015-06-22 ENCOUNTER — Inpatient Hospital Stay: Payer: Commercial Managed Care - HMO | Attending: Internal Medicine

## 2015-06-22 ENCOUNTER — Inpatient Hospital Stay: Payer: Commercial Managed Care - HMO

## 2015-06-22 DIAGNOSIS — D751 Secondary polycythemia: Secondary | ICD-10-CM

## 2015-06-22 DIAGNOSIS — E785 Hyperlipidemia, unspecified: Secondary | ICD-10-CM | POA: Diagnosis not present

## 2015-06-22 DIAGNOSIS — J449 Chronic obstructive pulmonary disease, unspecified: Secondary | ICD-10-CM | POA: Insufficient documentation

## 2015-06-22 DIAGNOSIS — I10 Essential (primary) hypertension: Secondary | ICD-10-CM | POA: Diagnosis not present

## 2015-06-22 DIAGNOSIS — M810 Age-related osteoporosis without current pathological fracture: Secondary | ICD-10-CM | POA: Insufficient documentation

## 2015-06-22 DIAGNOSIS — Z79899 Other long term (current) drug therapy: Secondary | ICD-10-CM | POA: Diagnosis not present

## 2015-06-22 LAB — HEMATOCRIT: HCT: 42 % (ref 35.0–47.0)

## 2015-07-06 ENCOUNTER — Ambulatory Visit: Payer: Commercial Managed Care - HMO | Admitting: Internal Medicine

## 2015-07-06 ENCOUNTER — Other Ambulatory Visit: Payer: Commercial Managed Care - HMO

## 2015-07-13 ENCOUNTER — Inpatient Hospital Stay: Payer: Commercial Managed Care - HMO

## 2015-07-16 ENCOUNTER — Ambulatory Visit: Payer: Commercial Managed Care - HMO | Admitting: Internal Medicine

## 2015-07-16 ENCOUNTER — Other Ambulatory Visit: Payer: Commercial Managed Care - HMO

## 2015-07-16 ENCOUNTER — Ambulatory Visit: Payer: Commercial Managed Care - HMO

## 2015-07-17 ENCOUNTER — Other Ambulatory Visit: Payer: Self-pay | Admitting: Internal Medicine

## 2015-07-17 DIAGNOSIS — Z1239 Encounter for other screening for malignant neoplasm of breast: Secondary | ICD-10-CM

## 2015-07-17 DIAGNOSIS — D751 Secondary polycythemia: Secondary | ICD-10-CM | POA: Insufficient documentation

## 2015-07-18 ENCOUNTER — Inpatient Hospital Stay: Payer: Commercial Managed Care - HMO

## 2015-07-18 ENCOUNTER — Encounter: Payer: Self-pay | Admitting: Oncology

## 2015-07-18 ENCOUNTER — Inpatient Hospital Stay (HOSPITAL_BASED_OUTPATIENT_CLINIC_OR_DEPARTMENT_OTHER): Payer: Commercial Managed Care - HMO | Admitting: Oncology

## 2015-07-18 VITALS — BP 140/79 | HR 90 | Temp 97.7°F | Wt 122.4 lb

## 2015-07-18 DIAGNOSIS — J449 Chronic obstructive pulmonary disease, unspecified: Secondary | ICD-10-CM | POA: Diagnosis not present

## 2015-07-18 DIAGNOSIS — D751 Secondary polycythemia: Secondary | ICD-10-CM

## 2015-07-18 DIAGNOSIS — I10 Essential (primary) hypertension: Secondary | ICD-10-CM | POA: Diagnosis not present

## 2015-07-18 DIAGNOSIS — M810 Age-related osteoporosis without current pathological fracture: Secondary | ICD-10-CM

## 2015-07-18 DIAGNOSIS — Z79899 Other long term (current) drug therapy: Secondary | ICD-10-CM

## 2015-07-18 DIAGNOSIS — E785 Hyperlipidemia, unspecified: Secondary | ICD-10-CM | POA: Diagnosis not present

## 2015-07-18 LAB — HEMATOCRIT: HEMATOCRIT: 42.7 % (ref 35.0–47.0)

## 2015-07-26 ENCOUNTER — Other Ambulatory Visit: Payer: Self-pay | Admitting: Internal Medicine

## 2015-07-26 ENCOUNTER — Ambulatory Visit
Admission: RE | Admit: 2015-07-26 | Discharge: 2015-07-26 | Disposition: A | Discharge status: Home / Self Care | Payer: Commercial Managed Care - HMO | Source: Ambulatory Visit | Attending: Internal Medicine | Admitting: Internal Medicine

## 2015-07-26 DIAGNOSIS — Z1231 Encounter for screening mammogram for malignant neoplasm of breast: Secondary | ICD-10-CM | POA: Insufficient documentation

## 2015-07-26 DIAGNOSIS — Z1239 Encounter for other screening for malignant neoplasm of breast: Secondary | ICD-10-CM

## 2015-07-27 NOTE — Progress Notes (Signed)
[place]  Telephone:(336) [phone] Fax:(336) [phone]  ID: Dana Perez OB: March 11, 1941  MR#: [id number]  [id number]  Patient Care Team: Glendon Axe, MD as PCP - General (Internal Medicine)  CHIEF COMPLAINT:  Chief Complaint  Patient presents with  . erythrocytosis    INTERVAL HISTORY:  And consideration of additional phlebotomy. Returns to clinic today for repeat laboratory work, further evaluation, and consideration of additional phlebotomy. She currently feels well and is asymptomatic. She has no neurologic complaints. She does not complain of weakness or fatigue. She has no chest pain or shortness of breath. She denies any nausea, vomiting, constipation, or diarrhea. She has no urinary complaints. Patient feels at her baseline and offers no specific complaints today.  REVIEW OF SYSTEMS:   Review of Systems  Constitutional: Negative.  Negative for malaise/fatigue.  Respiratory: Negative.  Negative for shortness of breath.   Cardiovascular: Negative.  Negative for chest pain.  Gastrointestinal: Negative.   Musculoskeletal: Negative.   Neurological: Negative.  Negative for weakness.    As per HPI. Otherwise, a complete review of systems is negatve.  PAST MEDICAL HISTORY: Past Medical History  Diagnosis Date  . Osteoporosis   . Hypertension   . Hyperlipidemia   . Hypercholesteremia   . Thyroid nodule     PAST SURGICAL HISTORY: Past Surgical History  Procedure Laterality Date  . Abdominal hysterectomy    . Wrist fracture surgery  08/2009    badly broken    FAMILY HISTORY Family History  Problem Relation Age of Onset  . Heart disease Mother   . Diabetes Father        ADVANCED DIRECTIVES:    HEALTH MAINTENANCE: Social History  Substance Use Topics  . Smoking status: Never Smoker   . Smokeless tobacco: Never Used  . Alcohol Use: No     Colonoscopy:  PAP:  Bone density:  Lipid panel:  Allergies  Allergen Reactions  .  Other Other (See Comments) and Rash    TDAP    Current Outpatient Prescriptions  Medication Sig Dispense Refill  . amLODipine (NORVASC) 2.5 MG tablet Take 2.5 mg by mouth daily.    . Calcium Carbonate-Vitamin D (CALCIUM-VITAMIN D) 500-200 MG-UNIT per tablet Take 2 tablets by mouth daily.    . pravastatin (PRAVACHOL) 10 MG tablet Take 10 mg by mouth daily.     No current facility-administered medications for this visit.   Facility-Administered Medications Ordered in Other Visits  Medication Dose Route Frequency Provider Last Rate Last Dose  . heparin lock flush 100 unit/mL  250 Units Intracatheter PRN Leia Alf, MD      . heparin lock flush 100 unit/mL  500 Units Intracatheter PRN Leia Alf, MD      . sodium chloride 0.9 % injection 10 mL  10 mL Intracatheter PRN Leia Alf, MD      . sodium chloride 0.9 % injection 10 mL  10 mL Intracatheter PRN Leia Alf, MD      . sodium chloride 0.9 % injection 10 mL  10 mL Intracatheter PRN Leia Alf, MD      . sodium chloride 0.9 % injection 10 mL  10 mL Intracatheter PRN Leia Alf, MD        OBJECTIVE: Filed Vitals:   07/18/15 1408  BP: 140/79  Pulse: 90  Temp: 97.7 F (36.5 C)     Body mass index is 20.99 kg/(m^2).    ECOG FS:0 - Asymptomatic  General: Well-developed, well-nourished, no acute distress. Eyes: Pink  conjunctiva, anicteric sclera. Lungs: Clear to auscultation bilaterally. Heart: Regular rate and rhythm. No rubs, murmurs, or gallops. Abdomen: Soft, nontender, nondistended. No organomegaly noted, normoactive bowel sounds. Musculoskeletal: No edema, cyanosis, or clubbing. Neuro: Alert, answering all questions appropriately. Cranial nerves grossly intact. Skin: No rashes or petechiae noted. Psych: Normal affect.   LAB RESULTS:  No results found for: NA, K, CL, CO2, GLUCOSE, BUN, CREATININE, CALCIUM, PROT, ALBUMIN, AST, ALT, ALKPHOS, BILITOT, GFRNONAA, GFRAA  Lab Results  Component Value Date     WBC 9.4 12/26/2014   HGB 14.8 12/26/2014   HCT 42.7 07/18/2015   MCV 92.2 12/26/2014   PLT 214 12/26/2014     STUDIES: Mm Screening Breast Tomo Bilateral  07/26/2015  CLINICAL DATA:  Screening. EXAM: DIGITAL SCREENING BILATERAL MAMMOGRAM WITH 3D TOMO WITH CAD COMPARISON:  Previous exam(s). ACR Breast Density Category b: There are scattered areas of fibroglandular density. FINDINGS: There are no findings suspicious for malignancy. Images were processed with CAD. IMPRESSION: No mammographic evidence of malignancy. A result letter of this screening mammogram will be mailed directly to the patient. RECOMMENDATION: Screening mammogram in one year. (Code:SM-B-01Y) BI-RADS CATEGORY  1: Negative. Electronically Signed   By: Altamese Cabal M.D.   On: 07/26/2015 16:26    ASSESSMENT: Secondary polycythemia, likely due to COPD.  PLAN:    1. Polycythemia: Previously, patient's entire workup including JAK-2 mutation and exon 12 analysis, carboxyhemoglobin level, and erythropoietin level was either negative or within normal limits. Patient's goal hematocrit has been set at 46.0. She is well under her goal at 42.7 today. She does not require phlebotomy. Return to clinic in 3 months for laboratory work and then in 6 months for laboratory work and further evaluation. If patient's hematocrit continues to be within normal limits, she possibly could be discharged from clinic.  2. Hypertension: Blood pressure mildly elevated today. Continue current medications as prescribed.   Patient expressed understanding and was in agreement with this plan. She also understands that She can call clinic at any time with any questions, concerns, or complaints.     [name], MD   07/27/2015 3:34 PM

## 2015-10-09 DIAGNOSIS — I10 Essential (primary) hypertension: Secondary | ICD-10-CM | POA: Diagnosis not present

## 2015-10-09 DIAGNOSIS — E7800 Pure hypercholesterolemia, unspecified: Secondary | ICD-10-CM | POA: Diagnosis not present

## 2015-10-16 ENCOUNTER — Inpatient Hospital Stay: Payer: PPO | Attending: Oncology

## 2015-10-16 ENCOUNTER — Inpatient Hospital Stay: Payer: PPO

## 2015-10-16 DIAGNOSIS — J449 Chronic obstructive pulmonary disease, unspecified: Secondary | ICD-10-CM | POA: Diagnosis not present

## 2015-10-16 DIAGNOSIS — D751 Secondary polycythemia: Secondary | ICD-10-CM | POA: Diagnosis not present

## 2015-10-16 DIAGNOSIS — M8580 Other specified disorders of bone density and structure, unspecified site: Secondary | ICD-10-CM | POA: Diagnosis not present

## 2015-10-16 DIAGNOSIS — I10 Essential (primary) hypertension: Secondary | ICD-10-CM | POA: Diagnosis not present

## 2015-10-16 DIAGNOSIS — E7800 Pure hypercholesterolemia, unspecified: Secondary | ICD-10-CM | POA: Diagnosis not present

## 2015-10-16 LAB — CBC WITH DIFFERENTIAL/PLATELET
BASOS ABS: 0 10*3/uL (ref 0–0.1)
BASOS PCT: 1 %
EOS PCT: 1 %
Eosinophils Absolute: 0 10*3/uL (ref 0–0.7)
HEMATOCRIT: 43.6 % (ref 35.0–47.0)
Hemoglobin: 14.9 g/dL (ref 12.0–16.0)
Lymphocytes Relative: 32 %
Lymphs Abs: 2.7 10*3/uL (ref 1.0–3.6)
MCH: 30.4 pg (ref 26.0–34.0)
MCHC: 34.1 g/dL (ref 32.0–36.0)
MCV: 89 fL (ref 80.0–100.0)
MONO ABS: 0.5 10*3/uL (ref 0.2–0.9)
Monocytes Relative: 6 %
NEUTROS ABS: 5.2 10*3/uL (ref 1.4–6.5)
Neutrophils Relative %: 60 %
PLATELETS: 218 10*3/uL (ref 150–440)
RBC: 4.89 MIL/uL (ref 3.80–5.20)
RDW: 12.7 % (ref 11.5–14.5)
WBC: 8.5 10*3/uL (ref 3.6–11.0)

## 2015-10-16 NOTE — Progress Notes (Signed)
Patients Hct was 43.6.  Does not meet criteria for treamtnet

## 2015-10-30 DIAGNOSIS — M8580 Other specified disorders of bone density and structure, unspecified site: Secondary | ICD-10-CM | POA: Diagnosis not present

## 2016-01-16 DIAGNOSIS — F17200 Nicotine dependence, unspecified, uncomplicated: Secondary | ICD-10-CM | POA: Diagnosis not present

## 2016-01-16 DIAGNOSIS — Z23 Encounter for immunization: Secondary | ICD-10-CM | POA: Diagnosis not present

## 2016-01-16 DIAGNOSIS — Z0000 Encounter for general adult medical examination without abnormal findings: Secondary | ICD-10-CM | POA: Diagnosis not present

## 2016-01-17 ENCOUNTER — Inpatient Hospital Stay (HOSPITAL_BASED_OUTPATIENT_CLINIC_OR_DEPARTMENT_OTHER): Payer: PPO | Admitting: Oncology

## 2016-01-17 ENCOUNTER — Inpatient Hospital Stay: Payer: PPO | Attending: Oncology

## 2016-01-17 ENCOUNTER — Inpatient Hospital Stay: Payer: PPO

## 2016-01-17 VITALS — BP 154/74 | HR 74 | Temp 95.2°F | Resp 18 | Ht 64.0 in | Wt 120.2 lb

## 2016-01-17 DIAGNOSIS — J449 Chronic obstructive pulmonary disease, unspecified: Secondary | ICD-10-CM

## 2016-01-17 DIAGNOSIS — E785 Hyperlipidemia, unspecified: Secondary | ICD-10-CM

## 2016-01-17 DIAGNOSIS — I10 Essential (primary) hypertension: Secondary | ICD-10-CM | POA: Diagnosis not present

## 2016-01-17 DIAGNOSIS — D751 Secondary polycythemia: Secondary | ICD-10-CM

## 2016-01-17 DIAGNOSIS — Z79899 Other long term (current) drug therapy: Secondary | ICD-10-CM | POA: Diagnosis not present

## 2016-01-17 DIAGNOSIS — M810 Age-related osteoporosis without current pathological fracture: Secondary | ICD-10-CM | POA: Insufficient documentation

## 2016-01-17 LAB — CBC WITH DIFFERENTIAL/PLATELET
Basophils Absolute: 0.1 10*3/uL (ref 0–0.1)
Basophils Relative: 1 %
EOS PCT: 1 %
Eosinophils Absolute: 0.1 10*3/uL (ref 0–0.7)
HEMATOCRIT: 44.5 % (ref 35.0–47.0)
Hemoglobin: 15.3 g/dL (ref 12.0–16.0)
LYMPHS ABS: 3 10*3/uL (ref 1.0–3.6)
LYMPHS PCT: 30 %
MCH: 30.8 pg (ref 26.0–34.0)
MCHC: 34.3 g/dL (ref 32.0–36.0)
MCV: 89.6 fL (ref 80.0–100.0)
MONOS PCT: 5 %
Monocytes Absolute: 0.5 10*3/uL (ref 0.2–0.9)
Neutro Abs: 6.4 10*3/uL (ref 1.4–6.5)
Neutrophils Relative %: 63 %
Platelets: 204 10*3/uL (ref 150–440)
RBC: 4.97 MIL/uL (ref 3.80–5.20)
RDW: 12.2 % (ref 11.5–14.5)
WBC: 10.1 10*3/uL (ref 3.6–11.0)

## 2016-01-17 NOTE — Progress Notes (Signed)
No changes per pt last visit  No concerns noted

## 2016-01-17 NOTE — Progress Notes (Signed)
[place]  Telephone:(336) [phone] Fax:(336) [phone]  ID: Dana Perez OB: 10/22/1940  MR#: [id number]  [id number]  Patient Care Team: Glendon Axe, MD as PCP - General (Internal Medicine)  CHIEF COMPLAINT:  Chief Complaint  Patient presents with  . Follow-up    Polycythemia     INTERVAL HISTORY: Patient returns to clinic today for repeat laboratory work, further evaluation, and consideration of additional phlebotomy. She currently feels well and is asymptomatic. She has no neurologic complaints. She does not complain of weakness or fatigue. She has no chest pain or shortness of breath. She denies any nausea, vomiting, constipation, or diarrhea. She has no urinary complaints. Patient feels at her baseline and offers no specific complaints today.  REVIEW OF SYSTEMS:   Review of Systems  Constitutional: Negative.  Negative for malaise/fatigue.  Respiratory: Negative.  Negative for shortness of breath.   Cardiovascular: Negative.  Negative for chest pain.  Gastrointestinal: Negative.   Musculoskeletal: Negative.   Neurological: Negative.  Negative for weakness.    As per HPI. Otherwise, a complete review of systems is negatve.  PAST MEDICAL HISTORY: Past Medical History  Diagnosis Date  . Osteoporosis   . Hypertension   . Hyperlipidemia   . Hypercholesteremia   . Thyroid nodule     PAST SURGICAL HISTORY: Past Surgical History  Procedure Laterality Date  . Abdominal hysterectomy    . Wrist fracture surgery  08/2009    badly broken    FAMILY HISTORY Family History  Problem Relation Age of Onset  . Heart disease Mother   . Diabetes Father        ADVANCED DIRECTIVES:    HEALTH MAINTENANCE: Social History  Substance Use Topics  . Smoking status: Never Smoker   . Smokeless tobacco: Never Used  . Alcohol Use: No     Allergies  Allergen Reactions  . Other Other (See Comments) and Rash    TDAP    Current Outpatient  Prescriptions  Medication Sig Dispense Refill  . amLODipine (NORVASC) 2.5 MG tablet Take 2.5 mg by mouth daily.    . Calcium Carbonate-Vitamin D (CALCIUM-VITAMIN D) 500-200 MG-UNIT per tablet Take 2 tablets by mouth daily.    . pravastatin (PRAVACHOL) 10 MG tablet Take 10 mg by mouth daily.     No current facility-administered medications for this visit.   Facility-Administered Medications Ordered in Other Visits  Medication Dose Route Frequency Provider Last Rate Last Dose  . heparin lock flush 100 unit/mL  250 Units Intracatheter PRN Leia Alf, MD      . heparin lock flush 100 unit/mL  500 Units Intracatheter PRN Leia Alf, MD      . sodium chloride 0.9 % injection 10 mL  10 mL Intracatheter PRN Leia Alf, MD      . sodium chloride 0.9 % injection 10 mL  10 mL Intracatheter PRN Leia Alf, MD      . sodium chloride 0.9 % injection 10 mL  10 mL Intracatheter PRN Leia Alf, MD      . sodium chloride 0.9 % injection 10 mL  10 mL Intracatheter PRN Leia Alf, MD        OBJECTIVE: Filed Vitals:   01/17/16 1446  BP: 154/74  Pulse: 74  Temp: 95.2 F (35.1 C)  Resp: 18     Body mass index is 20.61 kg/(m^2).    ECOG FS:0 - Asymptomatic  General: Well-developed, well-nourished, no acute distress. Eyes: Pink conjunctiva, anicteric sclera. Lungs: Clear to auscultation  bilaterally. Heart: Regular rate and rhythm. No rubs, murmurs, or gallops. Abdomen: Soft, nontender, nondistended. No organomegaly noted, normoactive bowel sounds. Musculoskeletal: No edema, cyanosis, or clubbing. Neuro: Alert, answering all questions appropriately. Cranial nerves grossly intact. Skin: No rashes or petechiae noted. Psych: Normal affect.   LAB RESULTS:  No results found for: NA, K, CL, CO2, GLUCOSE, BUN, CREATININE, CALCIUM, PROT, ALBUMIN, AST, ALT, ALKPHOS, BILITOT, GFRNONAA, GFRAA  Lab Results  Component Value Date   WBC 10.1 01/17/2016   NEUTROABS 6.4 01/17/2016   HGB  15.3 01/17/2016   HCT 44.5 01/17/2016   MCV 89.6 01/17/2016   PLT 204 01/17/2016     STUDIES: No results found.  ASSESSMENT: Secondary polycythemia, likely due to COPD.  PLAN:    1. Polycythemia: Previously, patient's entire workup including JAK-2 mutation and exon 12 analysis, carboxyhemoglobin level, and erythropoietin level was either negative or within normal limits. Patient's goal hematocrit has been set at 46.0. She is well under her goal at 44.5 today. She does not require phlebotomy. Return to clinic in 3 months for laboratory work and then in 6 months for laboratory work and further evaluation. If patient's hematocrit continues to be within normal limits, she possibly could be discharged from clinic.  2. Hypertension: Blood pressure mildly elevated today. Continue current medications as prescribed.   Patient expressed understanding and was in agreement with this plan. She also understands that She can call clinic at any time with any questions, concerns, or complaints.     Mayra Reel, NP   01/17/2016 3:11 PM  Patient was seen and evaluated independently and I agree with the assessment and plan as dictated above.  [name], MD 01/21/2016 11:32 PM

## 2016-04-17 DIAGNOSIS — E7800 Pure hypercholesterolemia, unspecified: Secondary | ICD-10-CM | POA: Diagnosis not present

## 2016-04-17 DIAGNOSIS — I10 Essential (primary) hypertension: Secondary | ICD-10-CM | POA: Diagnosis not present

## 2016-04-18 ENCOUNTER — Other Ambulatory Visit: Payer: Self-pay

## 2016-04-22 ENCOUNTER — Inpatient Hospital Stay: Payer: PPO | Attending: Oncology

## 2016-04-22 ENCOUNTER — Other Ambulatory Visit: Payer: Self-pay

## 2016-04-22 DIAGNOSIS — J449 Chronic obstructive pulmonary disease, unspecified: Secondary | ICD-10-CM | POA: Insufficient documentation

## 2016-04-22 DIAGNOSIS — D751 Secondary polycythemia: Secondary | ICD-10-CM

## 2016-04-22 LAB — CBC WITH DIFFERENTIAL/PLATELET
BASOS ABS: 0 10*3/uL (ref 0–0.1)
BASOS PCT: 0 %
EOS ABS: 0 10*3/uL (ref 0–0.7)
Eosinophils Relative: 0 %
HEMATOCRIT: 46.8 % (ref 35.0–47.0)
Hemoglobin: 16.2 g/dL — ABNORMAL HIGH (ref 12.0–16.0)
Lymphocytes Relative: 30 %
Lymphs Abs: 2.9 10*3/uL (ref 1.0–3.6)
MCH: 31 pg (ref 26.0–34.0)
MCHC: 34.6 g/dL (ref 32.0–36.0)
MCV: 89.7 fL (ref 80.0–100.0)
MONO ABS: 0.4 10*3/uL (ref 0.2–0.9)
Monocytes Relative: 4 %
NEUTROS ABS: 6.4 10*3/uL (ref 1.4–6.5)
NEUTROS PCT: 66 %
Platelets: 261 10*3/uL (ref 150–440)
RBC: 5.22 MIL/uL — ABNORMAL HIGH (ref 3.80–5.20)
RDW: 12.9 % (ref 11.5–14.5)
WBC: 9.7 10*3/uL (ref 3.6–11.0)

## 2016-07-21 ENCOUNTER — Inpatient Hospital Stay: Payer: PPO | Attending: Oncology

## 2016-07-21 ENCOUNTER — Inpatient Hospital Stay (HOSPITAL_BASED_OUTPATIENT_CLINIC_OR_DEPARTMENT_OTHER): Payer: PPO | Admitting: Oncology

## 2016-07-21 ENCOUNTER — Ambulatory Visit: Payer: Self-pay | Admitting: Oncology

## 2016-07-21 ENCOUNTER — Other Ambulatory Visit: Payer: Self-pay | Admitting: *Deleted

## 2016-07-21 ENCOUNTER — Other Ambulatory Visit: Payer: Self-pay

## 2016-07-21 ENCOUNTER — Inpatient Hospital Stay: Payer: PPO

## 2016-07-21 VITALS — BP 146/69 | HR 71 | Temp 98.3°F | Resp 18 | Wt 118.4 lb

## 2016-07-21 DIAGNOSIS — D751 Secondary polycythemia: Secondary | ICD-10-CM | POA: Diagnosis not present

## 2016-07-21 DIAGNOSIS — J449 Chronic obstructive pulmonary disease, unspecified: Secondary | ICD-10-CM | POA: Diagnosis not present

## 2016-07-21 DIAGNOSIS — Z79899 Other long term (current) drug therapy: Secondary | ICD-10-CM | POA: Diagnosis not present

## 2016-07-21 DIAGNOSIS — I10 Essential (primary) hypertension: Secondary | ICD-10-CM | POA: Diagnosis not present

## 2016-07-21 DIAGNOSIS — D45 Polycythemia vera: Secondary | ICD-10-CM

## 2016-07-21 DIAGNOSIS — E7800 Pure hypercholesterolemia, unspecified: Secondary | ICD-10-CM | POA: Insufficient documentation

## 2016-07-21 DIAGNOSIS — R05 Cough: Secondary | ICD-10-CM

## 2016-07-21 LAB — CBC WITH DIFFERENTIAL/PLATELET
BASOS ABS: 0 10*3/uL (ref 0–0.1)
BASOS PCT: 0 %
EOS ABS: 0 10*3/uL (ref 0–0.7)
Eosinophils Relative: 0 %
HEMATOCRIT: 43.5 % (ref 35.0–47.0)
HEMOGLOBIN: 14.6 g/dL (ref 12.0–16.0)
Lymphocytes Relative: 26 %
Lymphs Abs: 3.1 10*3/uL (ref 1.0–3.6)
MCH: 29.5 pg (ref 26.0–34.0)
MCHC: 33.6 g/dL (ref 32.0–36.0)
MCV: 87.9 fL (ref 80.0–100.0)
MONOS PCT: 5 %
Monocytes Absolute: 0.6 10*3/uL (ref 0.2–0.9)
NEUTROS ABS: 8 10*3/uL — AB (ref 1.4–6.5)
NEUTROS PCT: 69 %
Platelets: 278 10*3/uL (ref 150–440)
RBC: 4.95 MIL/uL (ref 3.80–5.20)
RDW: 12.1 % (ref 11.5–14.5)
WBC: 11.9 10*3/uL — AB (ref 3.6–11.0)

## 2016-07-21 NOTE — Progress Notes (Signed)
[place]  Telephone:(336) [phone] Fax:(336) [phone]  ID: Dana Perez OB: 07/17/1941  MR#: [id number]  [id number]  Patient Care Team: Glendon Axe, MD as PCP - General (Internal Medicine)  CHIEF COMPLAINT: Secondary polycythemia.  INTERVAL HISTORY: Patient returns to clinic today for repeat laboratory work, further evaluation, and consideration of additional phlebotomy. She currently feels well and is asymptomatic. She has no neurologic complaints. She does not complain of weakness or fatigue. She has no chest pain or shortness of breath. She denies any nausea, vomiting, constipation, or diarrhea. She has no urinary complaints. Patient states that she is getting over a cold/cough but otherwise feels at her baseline and offers no specific additional complaints today.  REVIEW OF SYSTEMS:   Review of Systems  Constitutional: Negative.  Negative for fever, malaise/fatigue and weight loss.  Respiratory: Positive for cough. Negative for shortness of breath.   Cardiovascular: Negative.  Negative for chest pain and leg swelling.  Gastrointestinal: Negative.  Negative for abdominal pain.  Genitourinary: Negative.   Musculoskeletal: Negative.   Neurological: Negative.  Negative for weakness.  Psychiatric/Behavioral: Negative.  The patient is not nervous/anxious.     As per HPI. Otherwise, a complete review of systems is negative.  PAST MEDICAL HISTORY: Past Medical History:  Diagnosis Date  . Hypercholesteremia   . Hyperlipidemia   . Hypertension   . Osteoporosis   . Thyroid nodule     PAST SURGICAL HISTORY: Past Surgical History:  Procedure Laterality Date  . ABDOMINAL HYSTERECTOMY    . WRIST FRACTURE SURGERY  08/2009   badly broken    FAMILY HISTORY Family History  Problem Relation Age of Onset  . Heart disease Mother   . Diabetes Father        ADVANCED DIRECTIVES:    HEALTH MAINTENANCE: Social History  Substance Use Topics  .  Smoking status: Never Smoker  . Smokeless tobacco: Never Used  . Alcohol use No     Allergies  Allergen Reactions  . Other Other (See Comments) and Rash    TDAP    Current Outpatient Prescriptions  Medication Sig Dispense Refill  . amLODipine (NORVASC) 2.5 MG tablet Take 2.5 mg by mouth daily.    . Calcium Carbonate-Vitamin D (CALCIUM-VITAMIN D) 500-200 MG-UNIT per tablet Take 2 tablets by mouth daily.    . pravastatin (PRAVACHOL) 10 MG tablet Take 10 mg by mouth daily.     No current facility-administered medications for this visit.    Facility-Administered Medications Ordered in Other Visits  Medication Dose Route Frequency Provider Last Rate Last Dose  . heparin lock flush 100 unit/mL  250 Units Intracatheter PRN Dana Alf, MD      . heparin lock flush 100 unit/mL  500 Units Intracatheter PRN Dana Alf, MD      . sodium chloride 0.9 % injection 10 mL  10 mL Intracatheter PRN Dana Alf, MD      . sodium chloride 0.9 % injection 10 mL  10 mL Intracatheter PRN Dana Alf, MD      . sodium chloride 0.9 % injection 10 mL  10 mL Intracatheter PRN Dana Alf, MD      . sodium chloride 0.9 % injection 10 mL  10 mL Intracatheter PRN Dana Alf, MD        OBJECTIVE: Vitals:   07/21/16 1105  BP: (!) 146/69  Pulse: 71  Resp: 18  Temp: 98.3 F (36.8 C)     Body mass index is 20.32 kg/m.  ECOG FS:0 - Asymptomatic  General: Well-developed, well-nourished, no acute distress. Eyes: Pink conjunctiva, anicteric sclera. Lungs: Clear to auscultation bilaterally. Heart: Regular rate and rhythm. No rubs, murmurs, or gallops. Abdomen: Soft, nontender, nondistended. No organomegaly noted, normoactive bowel sounds. Musculoskeletal: No edema, cyanosis, or clubbing. Neuro: Alert, answering all questions appropriately. Cranial nerves grossly intact. Skin: No rashes or petechiae noted. Psych: Normal affect.   LAB RESULTS:  No results found for: NA, K, CL, CO2,  GLUCOSE, BUN, CREATININE, CALCIUM, PROT, ALBUMIN, AST, ALT, ALKPHOS, BILITOT, GFRNONAA, GFRAA  Lab Results  Component Value Date   WBC 11.9 (H) 07/21/2016   NEUTROABS 8.0 (H) 07/21/2016   HGB 14.6 07/21/2016   HCT 43.5 07/21/2016   MCV 87.9 07/21/2016   PLT 278 07/21/2016     STUDIES: No results found.  ASSESSMENT: Secondary polycythemia, likely due to COPD.  PLAN:    1. Secondary polycythemia: Previously, patient's entire workup including JAK-2 mutation and exon 12 analysis, carboxyhemoglobin level, and erythropoietin level was either negative or within normal limits. Patient's goal hematocrit has been set at 46.0. She is well under her goal again at 43.5 today. She does not require phlebotomy today. Discussed with patient about being discharged from clinic and being monitored by PCP. She agreed with plan and no further follow-up has been scheduled.   Patient expressed understanding and was in agreement with this plan. She also understands that She can call clinic at any time with any questions, concerns, or complaints.   Dana Hawking, NP  07/21/2016 12:20PM  Patient was seen and evaluated independently and I agree with the assessment and plan as dictated above.  Please refer patient back if her hemoglobin increases.  [name], MD 07/21/16 9:56 PM

## 2016-07-21 NOTE — Progress Notes (Signed)
Has had productive cough for appx 2 weeks. Afebrile. Complains of sides hurting from frequent coughing episodes.

## 2016-07-24 DIAGNOSIS — I10 Essential (primary) hypertension: Secondary | ICD-10-CM | POA: Diagnosis not present

## 2016-07-24 DIAGNOSIS — J208 Acute bronchitis due to other specified organisms: Secondary | ICD-10-CM | POA: Diagnosis not present

## 2016-07-24 DIAGNOSIS — E7800 Pure hypercholesterolemia, unspecified: Secondary | ICD-10-CM | POA: Diagnosis not present

## 2016-07-24 DIAGNOSIS — B9689 Other specified bacterial agents as the cause of diseases classified elsewhere: Secondary | ICD-10-CM | POA: Diagnosis not present

## 2016-07-24 DIAGNOSIS — M810 Age-related osteoporosis without current pathological fracture: Secondary | ICD-10-CM | POA: Diagnosis not present

## 2016-08-20 ENCOUNTER — Other Ambulatory Visit: Payer: Self-pay | Admitting: Internal Medicine

## 2016-08-20 DIAGNOSIS — Z1231 Encounter for screening mammogram for malignant neoplasm of breast: Secondary | ICD-10-CM

## 2016-09-17 ENCOUNTER — Ambulatory Visit
Admission: RE | Admit: 2016-09-17 | Discharge: 2016-09-17 | Disposition: A | Discharge status: Home / Self Care | Payer: PPO | Source: Ambulatory Visit | Attending: Internal Medicine | Admitting: Internal Medicine

## 2016-09-17 DIAGNOSIS — Z1231 Encounter for screening mammogram for malignant neoplasm of breast: Secondary | ICD-10-CM | POA: Diagnosis not present

## 2016-10-27 DIAGNOSIS — E7800 Pure hypercholesterolemia, unspecified: Secondary | ICD-10-CM | POA: Diagnosis not present

## 2016-10-27 DIAGNOSIS — Z0000 Encounter for general adult medical examination without abnormal findings: Secondary | ICD-10-CM | POA: Diagnosis not present

## 2016-10-27 DIAGNOSIS — Z131 Encounter for screening for diabetes mellitus: Secondary | ICD-10-CM | POA: Diagnosis not present

## 2016-10-27 DIAGNOSIS — I10 Essential (primary) hypertension: Secondary | ICD-10-CM | POA: Diagnosis not present

## 2016-10-27 DIAGNOSIS — M810 Age-related osteoporosis without current pathological fracture: Secondary | ICD-10-CM | POA: Diagnosis not present

## 2017-01-21 DIAGNOSIS — Z131 Encounter for screening for diabetes mellitus: Secondary | ICD-10-CM | POA: Diagnosis not present

## 2017-01-21 DIAGNOSIS — E7800 Pure hypercholesterolemia, unspecified: Secondary | ICD-10-CM | POA: Diagnosis not present

## 2017-01-21 DIAGNOSIS — I10 Essential (primary) hypertension: Secondary | ICD-10-CM | POA: Diagnosis not present

## 2017-01-28 DIAGNOSIS — E7800 Pure hypercholesterolemia, unspecified: Secondary | ICD-10-CM | POA: Diagnosis not present

## 2017-01-28 DIAGNOSIS — D751 Secondary polycythemia: Secondary | ICD-10-CM | POA: Diagnosis not present

## 2017-01-28 DIAGNOSIS — M810 Age-related osteoporosis without current pathological fracture: Secondary | ICD-10-CM | POA: Diagnosis not present

## 2017-01-28 DIAGNOSIS — I10 Essential (primary) hypertension: Secondary | ICD-10-CM | POA: Diagnosis not present

## 2017-04-14 DIAGNOSIS — D751 Secondary polycythemia: Secondary | ICD-10-CM | POA: Diagnosis not present

## 2017-04-14 DIAGNOSIS — I10 Essential (primary) hypertension: Secondary | ICD-10-CM | POA: Diagnosis not present

## 2017-04-21 DIAGNOSIS — I10 Essential (primary) hypertension: Secondary | ICD-10-CM | POA: Diagnosis not present

## 2017-04-21 DIAGNOSIS — N183 Chronic kidney disease, stage 3 (moderate): Secondary | ICD-10-CM | POA: Diagnosis not present

## 2017-04-21 DIAGNOSIS — F17200 Nicotine dependence, unspecified, uncomplicated: Secondary | ICD-10-CM | POA: Diagnosis not present

## 2017-04-21 DIAGNOSIS — R220 Localized swelling, mass and lump, head: Secondary | ICD-10-CM | POA: Diagnosis not present

## 2017-04-21 DIAGNOSIS — R221 Localized swelling, mass and lump, neck: Secondary | ICD-10-CM | POA: Diagnosis not present

## 2017-04-23 ENCOUNTER — Other Ambulatory Visit: Payer: Self-pay | Admitting: Internal Medicine

## 2017-04-23 DIAGNOSIS — R221 Localized swelling, mass and lump, neck: Principal | ICD-10-CM

## 2017-04-23 DIAGNOSIS — R220 Localized swelling, mass and lump, head: Secondary | ICD-10-CM

## 2017-04-26 DIAGNOSIS — N183 Chronic kidney disease, stage 3 unspecified: Secondary | ICD-10-CM | POA: Insufficient documentation

## 2017-04-29 ENCOUNTER — Ambulatory Visit
Admission: RE | Admit: 2017-04-29 | Discharge: 2017-04-29 | Disposition: A | Discharge status: Home / Self Care | Payer: PPO | Source: Ambulatory Visit | Attending: Internal Medicine | Admitting: Internal Medicine

## 2017-04-29 DIAGNOSIS — R220 Localized swelling, mass and lump, head: Secondary | ICD-10-CM | POA: Diagnosis not present

## 2017-04-29 DIAGNOSIS — R221 Localized swelling, mass and lump, neck: Secondary | ICD-10-CM | POA: Diagnosis not present

## 2017-05-04 ENCOUNTER — Other Ambulatory Visit: Payer: Self-pay | Admitting: Internal Medicine

## 2017-05-04 DIAGNOSIS — R221 Localized swelling, mass and lump, neck: Principal | ICD-10-CM

## 2017-05-04 DIAGNOSIS — R220 Localized swelling, mass and lump, head: Secondary | ICD-10-CM

## 2017-05-08 ENCOUNTER — Ambulatory Visit
Admission: RE | Admit: 2017-05-08 | Discharge: 2017-05-08 | Disposition: A | Discharge status: Home / Self Care | Payer: PPO | Source: Ambulatory Visit | Attending: Internal Medicine | Admitting: Internal Medicine

## 2017-05-08 DIAGNOSIS — J439 Emphysema, unspecified: Secondary | ICD-10-CM | POA: Diagnosis not present

## 2017-05-08 DIAGNOSIS — R221 Localized swelling, mass and lump, neck: Secondary | ICD-10-CM | POA: Insufficient documentation

## 2017-05-08 DIAGNOSIS — R220 Localized swelling, mass and lump, head: Secondary | ICD-10-CM | POA: Insufficient documentation

## 2017-05-08 MED ORDER — IOPAMIDOL (ISOVUE-300) INJECTION 61%
75.0000 mL | Freq: Once | INTRAVENOUS | Status: AC | PRN
  Administered 2017-05-08: 75 mL via INTRAVENOUS

## 2017-05-11 DIAGNOSIS — R220 Localized swelling, mass and lump, head: Secondary | ICD-10-CM | POA: Diagnosis not present

## 2017-05-11 DIAGNOSIS — F17200 Nicotine dependence, unspecified, uncomplicated: Secondary | ICD-10-CM | POA: Diagnosis not present

## 2017-05-11 DIAGNOSIS — R221 Localized swelling, mass and lump, neck: Secondary | ICD-10-CM | POA: Diagnosis not present

## 2017-05-20 DIAGNOSIS — D37032 Neoplasm of uncertain behavior of the submandibular salivary glands: Secondary | ICD-10-CM | POA: Diagnosis not present

## 2017-05-25 ENCOUNTER — Other Ambulatory Visit: Payer: Self-pay | Admitting: Otolaryngology

## 2017-05-26 ENCOUNTER — Other Ambulatory Visit: Payer: Self-pay | Admitting: Otolaryngology

## 2017-05-26 DIAGNOSIS — R221 Localized swelling, mass and lump, neck: Secondary | ICD-10-CM

## 2017-06-02 ENCOUNTER — Other Ambulatory Visit: Payer: Self-pay | Admitting: Radiology

## 2017-06-03 ENCOUNTER — Ambulatory Visit
Admission: RE | Admit: 2017-06-03 | Discharge: 2017-06-03 | Disposition: A | Discharge status: Home / Self Care | Payer: PPO | Source: Ambulatory Visit | Attending: Otolaryngology | Admitting: Otolaryngology

## 2017-06-03 DIAGNOSIS — R221 Localized swelling, mass and lump, neck: Secondary | ICD-10-CM | POA: Insufficient documentation

## 2017-06-03 DIAGNOSIS — C7989 Secondary malignant neoplasm of other specified sites: Secondary | ICD-10-CM | POA: Insufficient documentation

## 2017-06-03 DIAGNOSIS — R220 Localized swelling, mass and lump, head: Secondary | ICD-10-CM | POA: Diagnosis present

## 2017-06-03 DIAGNOSIS — C770 Secondary and unspecified malignant neoplasm of lymph nodes of head, face and neck: Secondary | ICD-10-CM | POA: Diagnosis not present

## 2017-06-03 LAB — PROTIME-INR
INR: 0.9
Prothrombin Time: 12.1 seconds (ref 11.4–15.2)

## 2017-06-03 LAB — CBC
HEMATOCRIT: 45.2 % (ref 35.0–47.0)
HEMOGLOBIN: 15.2 g/dL (ref 12.0–16.0)
MCH: 30.3 pg (ref 26.0–34.0)
MCHC: 33.7 g/dL (ref 32.0–36.0)
MCV: 90.1 fL (ref 80.0–100.0)
Platelets: 259 10*3/uL (ref 150–440)
RBC: 5.02 MIL/uL (ref 3.80–5.20)
RDW: 12.4 % (ref 11.5–14.5)
WBC: 10.2 10*3/uL (ref 3.6–11.0)

## 2017-06-03 LAB — APTT: APTT: 28 s (ref 24–36)

## 2017-06-03 MED ORDER — MIDAZOLAM HCL 5 MG/5ML IJ SOLN
INTRAMUSCULAR | Status: AC
  Filled 2017-06-03: qty 5

## 2017-06-03 MED ORDER — SODIUM CHLORIDE 0.9 % IV SOLN
INTRAVENOUS | Status: DC
  Administered 2017-06-03: 11:00:00 via INTRAVENOUS

## 2017-06-03 MED ORDER — MIDAZOLAM HCL 5 MG/5ML IJ SOLN
INTRAMUSCULAR | Status: AC | PRN
  Administered 2017-06-03: 0.5 mg via INTRAVENOUS

## 2017-06-03 MED ORDER — FENTANYL CITRATE (PF) 100 MCG/2ML IJ SOLN
INTRAMUSCULAR | Status: AC
  Filled 2017-06-03: qty 2

## 2017-06-03 MED ORDER — FENTANYL CITRATE (PF) 100 MCG/2ML IJ SOLN
INTRAMUSCULAR | Status: AC | PRN
Start: 2017-06-03 — End: 2017-06-03
  Administered 2017-06-03 (×2): 25 ug via INTRAVENOUS

## 2017-06-03 NOTE — Procedures (Signed)
Interventional Radiology Procedure Note  Procedure: US guided core bx of 2 cm right submandibular mass.   Complications: None  Estimated Blood Loss: None  Recommendations: - DC home  Signed,  Criselda Peaches, MD

## 2017-06-04 ENCOUNTER — Other Ambulatory Visit: Payer: Self-pay | Admitting: Pathology

## 2017-06-04 LAB — SURGICAL PATHOLOGY

## 2017-06-05 ENCOUNTER — Other Ambulatory Visit: Payer: Self-pay | Admitting: Otolaryngology

## 2017-06-05 DIAGNOSIS — C760 Malignant neoplasm of head, face and neck: Secondary | ICD-10-CM

## 2017-06-05 DIAGNOSIS — R490 Dysphonia: Secondary | ICD-10-CM | POA: Diagnosis not present

## 2017-06-05 DIAGNOSIS — C490 Malignant neoplasm of connective and soft tissue of head, face and neck: Secondary | ICD-10-CM | POA: Diagnosis not present

## 2017-06-11 ENCOUNTER — Ambulatory Visit: Payer: PPO

## 2017-06-22 ENCOUNTER — Encounter
Admission: RE | Admit: 2017-06-22 | Discharge: 2017-06-22 | Disposition: A | Discharge status: Home / Self Care | Payer: PPO | Source: Ambulatory Visit | Attending: Otolaryngology | Admitting: Otolaryngology

## 2017-06-22 DIAGNOSIS — C4442 Squamous cell carcinoma of skin of scalp and neck: Secondary | ICD-10-CM | POA: Diagnosis not present

## 2017-06-22 DIAGNOSIS — C760 Malignant neoplasm of head, face and neck: Secondary | ICD-10-CM | POA: Insufficient documentation

## 2017-06-22 LAB — GLUCOSE, CAPILLARY: GLUCOSE-CAPILLARY: 98 mg/dL (ref 65–99)

## 2017-06-22 MED ORDER — FLUDEOXYGLUCOSE F - 18 (FDG) INJECTION
12.0000 | Freq: Once | INTRAVENOUS | Status: AC | PRN
  Administered 2017-06-22: 12.66 via INTRAVENOUS

## 2017-06-24 ENCOUNTER — Encounter: Payer: Self-pay | Admitting: Gastroenterology

## 2017-06-24 ENCOUNTER — Other Ambulatory Visit: Payer: Self-pay

## 2017-06-24 ENCOUNTER — Ambulatory Visit: Payer: PPO | Admitting: Gastroenterology

## 2017-06-24 VITALS — BP 141/67 | Ht 65.0 in | Wt 126.0 lb

## 2017-06-24 DIAGNOSIS — R948 Abnormal results of function studies of other organs and systems: Secondary | ICD-10-CM | POA: Diagnosis not present

## 2017-06-25 ENCOUNTER — Other Ambulatory Visit: Payer: Self-pay

## 2017-06-25 DIAGNOSIS — R948 Abnormal results of function studies of other organs and systems: Secondary | ICD-10-CM

## 2017-06-25 NOTE — Progress Notes (Signed)
Gastroenterology Consultation  Referring Provider:     Carloyn Manner, MD Primary Care Physician:  Glendon Axe, MD Primary Gastroenterologist:  Dr. Allen Norris     Reason for Consultation:     Positive PET scan        HPI:   Dana Perez is a 76 y.o. y/o female referred for consultation & management of positive PET scan by Dr. Glendon Axe, MD.  This patient comes in today with a positive tissue biopsy of squamous cell carcinoma of the neck the patient had a PET scan that showed there to be a enhancement in the ascending colon. It appears that the patient has last been seen by gastrology back in 2007. There is no report of any change in bowel habits nausea vomiting fevers or chills. There is no report of any family history of colon cancer colon polyps. The patient also denies any personal history of colon cancer colon polyps. The patient denies any dysphagia or nausea vomiting fevers or chills.  Past Medical History:  Diagnosis Date  . Hypercholesteremia   . Hyperlipidemia   . Hypertension   . Osteoporosis   . Thyroid nodule     Past Surgical History:  Procedure Laterality Date  . ABDOMINAL HYSTERECTOMY    . WRIST FRACTURE SURGERY  08/2009   badly broken    Prior to Admission medications   Medication Sig Start Date End Date Taking? Authorizing Provider  amLODipine (NORVASC) 2.5 MG tablet Take 2.5 mg by mouth daily.   Yes [provider]  Calcium Carbonate-Vitamin D (CALCIUM-VITAMIN D) 500-200 MG-UNIT per tablet Take 2 tablets by mouth daily.   Yes [provider]  pravastatin (PRAVACHOL) 10 MG tablet Take 10 mg by mouth daily.   Yes [provider]    Family History  Problem Relation Age of Onset  . Heart disease Mother   . Diabetes Father      Social History   Tobacco Use  . Smoking status: Current Every Day Smoker    Packs/day: 0.25    Types: Cigarettes  . Smokeless tobacco: Never Used  . Tobacco comment: occasional smoker    Substance Use Topics  . Alcohol use: No  . Drug use: No    Allergies as of 06/24/2017 - Review Complete 06/24/2017  Allergen Reaction Noted  . Other Other (See Comments) and Rash 07/18/2015    Review of Systems:    All systems reviewed and negative except where noted in HPI.   Physical Exam:  BP (!) 141/67 (BP Location: Right Arm, Patient Position: Sitting, Cuff Size: Normal)   Ht 5\' 5"  (1.651 m)   Wt 126 lb (57.2 kg)   BMI 20.97 kg/m  No LMP recorded. Patient has had a hysterectomy. Psych:  Alert and cooperative. Normal mood and affect. General:   Alert,  Well-developed, well-nourished, pleasant and cooperative in NAD Head:  Normocephalic and atraumatic. Eyes:  Sclera clear, no icterus.   Conjunctiva pink. Ears:  Normal auditory acuity. Nose:  No deformity, discharge, or lesions. Mouth:  No deformity or lesions,oropharynx pink & moist. Neck:  Supple; no masses or thyromegaly. Lungs:  Respirations even and unlabored.  Clear throughout to auscultation.   No wheezes, crackles, or rhonchi. No acute distress. Heart:  Regular rate and rhythm; no murmurs, clicks, rubs, or gallops. Abdomen:  Normal bowel sounds.  No bruits.  Soft, non-tender and non-distended without masses, hepatosplenomegaly or hernias noted.  No guarding or rebound tenderness.  Negative Carnett sign.   Rectal:  Deferred.  Msk:  Symmetrical without gross deformities.  Good, equal movement & strength bilaterally. Pulses:  Normal pulses noted. Extremities:  No clubbing or edema.  No cyanosis. Neurologic:  Alert and oriented x3;  grossly normal neurologically. Skin:  Intact without significant lesions or rashes.  No jaundice. Lymph Nodes:  No significant cervical adenopathy. Psych:  Alert and cooperative. Normal mood and affect.  Imaging Studies: Nm Pet Image Initial (pi) Skull Base To Thigh  Result Date: 06/22/2017 CLINICAL DATA:  Initial treatment strategy for squamous cell carcinoma of the neck. EXAM: NUCLEAR  MEDICINE PET SKULL BASE TO THIGH TECHNIQUE: 12166 mCi F-18 FDG was injected intravenously. Full-ring PET imaging was performed from the skull base to thigh after the radiotracer. CT data was obtained and used for attenuation correction and anatomic localization. FASTING BLOOD GLUCOSE:  Value: 98 mg/dl COMPARISON:  CT neck 05/08/2017 FINDINGS: NECK: Right level 1B nodal mass has enlarged since the prior study. It measures approximately 3.2 x 2.7 cm on image number 36. It previously measured 19 x 16 mm. Lesion is markedly hypermetabolic with SUV max of 37.8. Slightly smaller level 1 B lymph nodes more superiorly adjacent to the lower margin of the parotid gland are again demonstrated. The 7 mm nodule on image number 28 demonstrates mild hypermetabolism with SUV max of 3.5. A discrete mucosal lesion is not identified. Prominent but symmetric tonsillar tissue with mild symmetric hypermetabolism but no discrete lesion. CHEST: No hypermetabolic mediastinal or hilar nodes. No suspicious pulmonary nodules on the CT scan. Advanced emphysematous changes and areas of pulmonary scarring. Advanced atherosclerotic calcifications involving the aorta and branch vessels including three-vessel coronary artery calcifications. Small pericardial effusion is noted. ABDOMEN/PELVIS: No abnormal hypermetabolic activity within the liver, pancreas, adrenal glands, or spleen. No hypermetabolic lymph nodes in the abdomen or pelvis. Advanced aortic and iliac artery calcifications but no focal aneurysm. Focal area of hypermetabolism involving the ascending colon corresponds to a 2 cm soft tissue lesion. SUV max is 7.8. This is worrisome for large polyp or early colon cancer. Recommend colonoscopy. SKELETON: No focal hypermetabolic activity to suggest skeletal metastasis. IMPRESSION: 1. Enlarging right level 1B nodal mass with marked hypermetabolism and consistent with known neoplasm. Small adjacent 7 mm nodule is also hypermetabolic. 2. No  discrete mucosal lesion is identified. 3. Advanced emphysematous changes involving the lungs along with advanced atherosclerotic calcifications involving the thoracic aorta and coronary arteries but no findings suspicious for metastatic disease involving the chest. 4. 2 cm hypermetabolic soft tissue lesion involving the ascending colon as described above. Recommend colonoscopy. Electronically Signed   By: Marijo Sanes M.D.   On: 06/22/2017 13:58   [place] Core Biopsy (lymph Nodes)  Result Date: 06/03/2017 INDICATION: 76 year old female with a right submandibular mass. EXAM: Ultrasound-guided core biopsy MEDICATIONS: None. ANESTHESIA/SEDATION: Moderate (conscious) sedation was employed during this procedure. A total of Versed 0.5 mg and Fentanyl 50 mcg was administered intravenously. Moderate Sedation Time: 17 minutes. The patient's level of consciousness and vital signs were monitored continuously by radiology nursing throughout the procedure under my direct supervision. FLUOROSCOPY TIME:  Fluoroscopy Time: 0 minutes 0 seconds (0 mGy). COMPLICATIONS: None immediate. PROCEDURE: Informed written consent was obtained from the patient after a thorough discussion of the procedural risks, benefits and alternatives. All questions were addressed. A timeout was performed prior to the initiation of the procedure. The right submandibular region was interrogated with ultrasound. There is a heterogeneous 1.9 x 1.4 x 1.9 cm mass within the right submandibular gland. A  suitable skin entry site was selected and marked. The region was sterilely prepped and draped in standard fashion using chlorhexidine skin prep. Local anesthesia was attained by infiltration with 1% lidocaine. A small dermatotomy was made. Under real-time sonographic guidance, multiple 18 gauge core biopsies were obtained using a [employer] Mission automated biopsy device. Real-time ultrasound was used to confirm needle placement within the mass on all needle passes.  Biopsy specimens were placed on a moist Telfa pad and delivered to pathology for further evaluation. There was no complication. The patient tolerated the procedure well. IMPRESSION: Ultrasound-guided core biopsy of right submandibular mass. Electronically Signed   By: Jacqulynn Cadet M.D.   On: 06/03/2017 11:42    Assessment and Plan:   Dana Perez is a 76 y.o. y/o female the patient comes in today with a positive PET CT scan with a enhancement in the ascending colon. The patient has known squamous cell carcinoma. The patient will be set up for a colonoscopy. I have discussed risks & benefits which include, but are not limited to, bleeding, infection, perforation & drug reaction.  The patient agrees with this plan & written consent will be obtained.     Lucilla Lame, MD. [facility]   Note: This dictation was prepared with Dragon dictation along with smaller phrase technology. Any transcriptional errors that result from this process are unintentional.

## 2017-06-25 NOTE — H&P (View-Only) (Signed)
Gastroenterology Consultation  Referring Provider:     Carloyn Manner, MD Primary Care Physician:  Glendon Axe, MD Primary Gastroenterologist:  Dr. Allen Norris     Reason for Consultation:     Positive PET scan        HPI:   Dana Perez is a 76 y.o. y/o Perez referred for consultation & management of positive PET scan by Dr. Glendon Axe, MD.  This patient comes in today with a positive tissue biopsy of squamous cell carcinoma of the neck the patient had a PET scan that showed there to be a enhancement in the ascending colon. It appears that the patient has last been seen by gastrology back in 2007. There is no report of any change in bowel habits nausea vomiting fevers or chills. There is no report of any family history of colon cancer colon polyps. The patient also denies any personal history of colon cancer colon polyps. The patient denies any dysphagia or nausea vomiting fevers or chills.  Past Medical History:  Diagnosis Date  . Hypercholesteremia   . Hyperlipidemia   . Hypertension   . Osteoporosis   . Thyroid nodule     Past Surgical History:  Procedure Laterality Date  . ABDOMINAL HYSTERECTOMY    . WRIST FRACTURE SURGERY  08/2009   badly broken    Prior to Admission medications   Medication Sig Start Date End Date Taking? Authorizing Provider  amLODipine (NORVASC) 2.5 MG tablet Take 2.5 mg by mouth daily.   Yes [provider]  Calcium Carbonate-Vitamin D (CALCIUM-VITAMIN D) 500-200 MG-UNIT per tablet Take 2 tablets by mouth daily.   Yes [provider]  pravastatin (PRAVACHOL) 10 MG tablet Take 10 mg by mouth daily.   Yes [provider]    Family History  Problem Relation Age of Onset  . Heart disease Mother   . Diabetes Father      Social History   Tobacco Use  . Smoking status: Current Every Day Smoker    Packs/day: 0.25    Types: Cigarettes  . Smokeless tobacco: Never Used  . Tobacco comment: occasional smoker    Substance Use Topics  . Alcohol use: No  . Drug use: No    Allergies as of 06/24/2017 - Review Complete 06/24/2017  Allergen Reaction Noted  . Other Other (See Comments) and Rash 07/18/2015    Review of Systems:    All systems reviewed and negative except where noted in HPI.   Physical Exam:  BP (!) 141/67 (BP Location: Right Arm, Patient Position: Sitting, Cuff Size: Normal)   Ht 5\' 5"  (1.651 m)   Wt 126 lb (57.2 kg)   BMI 20.97 kg/m  No LMP recorded. Patient has had a hysterectomy. Psych:  Alert and cooperative. Normal mood and affect. General:   Alert,  Well-developed, well-nourished, pleasant and cooperative in NAD Head:  Normocephalic and atraumatic. Eyes:  Sclera clear, no icterus.   Conjunctiva pink. Ears:  Normal auditory acuity. Nose:  No deformity, discharge, or lesions. Mouth:  No deformity or lesions,oropharynx pink & moist. Neck:  Supple; no masses or thyromegaly. Lungs:  Respirations even and unlabored.  Clear throughout to auscultation.   No wheezes, crackles, or rhonchi. No acute distress. Heart:  Regular rate and rhythm; no murmurs, clicks, rubs, or gallops. Abdomen:  Normal bowel sounds.  No bruits.  Soft, non-tender and non-distended without masses, hepatosplenomegaly or hernias noted.  No guarding or rebound tenderness.  Negative Carnett sign.   Rectal:  Deferred.  Msk:  Symmetrical without gross deformities.  Good, equal movement & strength bilaterally. Pulses:  Normal pulses noted. Extremities:  No clubbing or edema.  No cyanosis. Neurologic:  Alert and oriented x3;  grossly normal neurologically. Skin:  Intact without significant lesions or rashes.  No jaundice. Lymph Nodes:  No significant cervical adenopathy. Psych:  Alert and cooperative. Normal mood and affect.  Imaging Studies: Nm Pet Image Initial (pi) Skull Base To Thigh  Result Date: 06/22/2017 CLINICAL DATA:  Initial treatment strategy for squamous cell carcinoma of the neck. EXAM: NUCLEAR  MEDICINE PET SKULL BASE TO THIGH TECHNIQUE: 12166 mCi F-18 FDG was injected intravenously. Full-ring PET imaging was performed from the skull base to thigh after the radiotracer. CT data was obtained and used for attenuation correction and anatomic localization. FASTING BLOOD GLUCOSE:  Value: 98 mg/dl COMPARISON:  CT neck 05/08/2017 FINDINGS: NECK: Right level 1B nodal mass has enlarged since the prior study. It measures approximately 3.2 x 2.7 cm on image number 36. It previously measured 19 x 16 mm. Lesion is markedly hypermetabolic with SUV max of 38.1. Slightly smaller level 1 B lymph nodes more superiorly adjacent to the lower margin of the parotid gland are again demonstrated. The 7 mm nodule on image number 28 demonstrates mild hypermetabolism with SUV max of 3.5. A discrete mucosal lesion is not identified. Prominent but symmetric tonsillar tissue with mild symmetric hypermetabolism but no discrete lesion. CHEST: No hypermetabolic mediastinal or hilar nodes. No suspicious pulmonary nodules on the CT scan. Advanced emphysematous changes and areas of pulmonary scarring. Advanced atherosclerotic calcifications involving the aorta and branch vessels including three-vessel coronary artery calcifications. Small pericardial effusion is noted. ABDOMEN/PELVIS: No abnormal hypermetabolic activity within the liver, pancreas, adrenal glands, or spleen. No hypermetabolic lymph nodes in the abdomen or pelvis. Advanced aortic and iliac artery calcifications but no focal aneurysm. Focal area of hypermetabolism involving the ascending colon corresponds to a 2 cm soft tissue lesion. SUV max is 7.8. This is worrisome for large polyp or early colon cancer. Recommend colonoscopy. SKELETON: No focal hypermetabolic activity to suggest skeletal metastasis. IMPRESSION: 1. Enlarging right level 1B nodal mass with marked hypermetabolism and consistent with known neoplasm. Small adjacent 7 mm nodule is also hypermetabolic. 2. No  discrete mucosal lesion is identified. 3. Advanced emphysematous changes involving the lungs along with advanced atherosclerotic calcifications involving the thoracic aorta and coronary arteries but no findings suspicious for metastatic disease involving the chest. 4. 2 cm hypermetabolic soft tissue lesion involving the ascending colon as described above. Recommend colonoscopy. Electronically Signed   By: Marijo Sanes M.D.   On: 06/22/2017 13:58   [place] Core Biopsy (lymph Nodes)  Result Date: 06/03/2017 INDICATION: 76 year old Perez with a right submandibular mass. EXAM: Ultrasound-guided core biopsy MEDICATIONS: None. ANESTHESIA/SEDATION: Moderate (conscious) sedation was employed during this procedure. A total of Versed 0.5 mg and Fentanyl 50 mcg was administered intravenously. Moderate Sedation Time: 17 minutes. The patient's level of consciousness and vital signs were monitored continuously by radiology nursing throughout the procedure under my direct supervision. FLUOROSCOPY TIME:  Fluoroscopy Time: 0 minutes 0 seconds (0 mGy). COMPLICATIONS: None immediate. PROCEDURE: Informed written consent was obtained from the patient after a thorough discussion of the procedural risks, benefits and alternatives. All questions were addressed. A timeout was performed prior to the initiation of the procedure. The right submandibular region was interrogated with ultrasound. There is a heterogeneous 1.9 x 1.4 x 1.9 cm mass within the right submandibular gland. A  suitable skin entry site was selected and marked. The region was sterilely prepped and draped in standard fashion using chlorhexidine skin prep. Local anesthesia was attained by infiltration with 1% lidocaine. A small dermatotomy was made. Under real-time sonographic guidance, multiple 18 gauge core biopsies were obtained using a [employer] automated biopsy device. Real-time ultrasound was used to confirm needle placement within the mass on all needle passes.  Biopsy specimens were placed on a moist Telfa pad and delivered to pathology for further evaluation. There was no complication. The patient tolerated the procedure well. IMPRESSION: Ultrasound-guided core biopsy of right submandibular mass. Electronically Signed   By: Jacqulynn Cadet M.D.   On: 06/03/2017 11:42    Assessment and Plan:   Dana Perez the patient comes in today with a positive PET CT scan with a enhancement in the ascending colon. The patient has known squamous cell carcinoma. The patient will be set up for a colonoscopy. I have discussed risks & benefits which include, but are not limited to, bleeding, infection, perforation & drug reaction.  The patient agrees with this plan & written consent will be obtained.     Lucilla Lame, MD. [facility]   Note: This dictation was prepared with Dragon dictation along with smaller phrase technology. Any transcriptional errors that result from this process are unintentional.

## 2017-06-29 DIAGNOSIS — C490 Malignant neoplasm of connective and soft tissue of head, face and neck: Secondary | ICD-10-CM | POA: Diagnosis not present

## 2017-06-30 NOTE — Discharge Instructions (Signed)
General Anesthesia, Adult, Care After °These instructions provide you with information about caring for yourself after your procedure. Your health care provider may also give you more specific instructions. Your treatment has been planned according to current medical practices, but problems sometimes occur. Call your health care provider if you have any problems or questions after your procedure. °What can I expect after the procedure? °After the procedure, it is common to have: °· Vomiting. °· A sore throat. °· Mental slowness. ° °It is common to feel: °· Nauseous. °· Cold or shivery. °· Sleepy. °· Tired. °· Sore or achy, even in parts of your body where you did not have surgery. ° °Follow these instructions at home: °For at least 24 hours after the procedure: °· Do not: °? Participate in activities where you could fall or become injured. °? Drive. °? Use heavy machinery. °? Drink alcohol. °? Take sleeping pills or medicines that cause drowsiness. °? Make important decisions or sign legal documents. °? Take care of children on your own. °· Rest. °Eating and drinking °· If you vomit, drink water, juice, or soup when you can drink without vomiting. °· Drink enough fluid to keep your urine clear or pale yellow. °· Make sure you have little or no nausea before eating solid foods. °· Follow the diet recommended by your health care provider. °General instructions °· Have a responsible adult stay with you until you are awake and alert. °· Return to your normal activities as told by your health care provider. Ask your health care provider what activities are safe for you. °· Take over-the-counter and prescription medicines only as told by your health care provider. °· If you smoke, do not smoke without supervision. °· Keep all follow-up visits as told by your health care provider. This is important. °Contact a health care provider if: °· You continue to have nausea or vomiting at home, and medicines are not helpful. °· You  cannot drink fluids or start eating again. °· You cannot urinate after 8-12 hours. °· You develop a skin rash. °· You have fever. °· You have increasing redness at the site of your procedure. °Get help right away if: °· You have difficulty breathing. °· You have chest pain. °· You have unexpected bleeding. °· You feel that you are having a life-threatening or urgent problem. °This information is not intended to replace advice given to you by your health care provider. Make sure you discuss any questions you have with your health care provider. °Document Released: 11/10/2000 Document Revised: 01/07/2016 Document Reviewed: 07/19/2015 °[employer] Interactive Patient Education © 2018 [employer] ° °

## 2017-07-01 ENCOUNTER — Inpatient Hospital Stay: Payer: PPO | Attending: Oncology | Admitting: Oncology

## 2017-07-01 ENCOUNTER — Other Ambulatory Visit: Payer: Self-pay

## 2017-07-01 ENCOUNTER — Encounter: Payer: Self-pay | Admitting: Oncology

## 2017-07-01 VITALS — BP 126/77 | HR 97 | Temp 98.3°F | Ht 64.0 in | Wt 124.9 lb

## 2017-07-01 DIAGNOSIS — E079 Disorder of thyroid, unspecified: Secondary | ICD-10-CM

## 2017-07-01 DIAGNOSIS — F17210 Nicotine dependence, cigarettes, uncomplicated: Secondary | ICD-10-CM | POA: Insufficient documentation

## 2017-07-01 DIAGNOSIS — M810 Age-related osteoporosis without current pathological fracture: Secondary | ICD-10-CM | POA: Insufficient documentation

## 2017-07-01 DIAGNOSIS — K639 Disease of intestine, unspecified: Secondary | ICD-10-CM | POA: Diagnosis not present

## 2017-07-01 DIAGNOSIS — I10 Essential (primary) hypertension: Secondary | ICD-10-CM | POA: Diagnosis not present

## 2017-07-01 DIAGNOSIS — Z79899 Other long term (current) drug therapy: Secondary | ICD-10-CM | POA: Diagnosis not present

## 2017-07-01 DIAGNOSIS — I313 Pericardial effusion (noninflammatory): Secondary | ICD-10-CM

## 2017-07-01 DIAGNOSIS — E785 Hyperlipidemia, unspecified: Secondary | ICD-10-CM | POA: Insufficient documentation

## 2017-07-01 DIAGNOSIS — C189 Malignant neoplasm of colon, unspecified: Secondary | ICD-10-CM | POA: Diagnosis not present

## 2017-07-01 DIAGNOSIS — I700 Atherosclerosis of aorta: Secondary | ICD-10-CM

## 2017-07-01 DIAGNOSIS — Z5111 Encounter for antineoplastic chemotherapy: Secondary | ICD-10-CM | POA: Insufficient documentation

## 2017-07-01 DIAGNOSIS — C760 Malignant neoplasm of head, face and neck: Secondary | ICD-10-CM | POA: Insufficient documentation

## 2017-07-01 MED ORDER — PROCHLORPERAZINE MALEATE 10 MG PO TABS: 10.0000 mg | ORAL_TABLET | Freq: Four times a day (QID) | ORAL | 2 refills | Status: DC | PRN

## 2017-07-01 MED ORDER — ONDANSETRON HCL 8 MG PO TABS: 8.0000 mg | ORAL_TABLET | Freq: Two times a day (BID) | ORAL | 2 refills | Status: DC | PRN

## 2017-07-01 NOTE — Progress Notes (Signed)
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  Patient noted an enlarging lymph node on her right neck.  Subsequent biopsy and PET scan revealed metastatic squamous cell carcinoma.  She currently feels well and is asymptomatic. She has no neurologic complaints. She does not complain of weakness or fatigue. She has no chest pain or shortness of breath. She denies any nausea, vomiting, constipation, or diarrhea. She has no urinary complaints. Patient offers no specific complaints today.  REVIEW OF SYSTEMS:   Review of Systems  Constitutional: Negative.  Negative for fever, malaise/fatigue and weight loss.  HENT: Negative.  Negative for sore throat.   Respiratory: Negative.  Negative for cough and shortness of breath.   Cardiovascular: Negative.  Negative for chest pain and leg swelling.  Gastrointestinal: Negative.  Negative for abdominal pain.  Genitourinary: Negative.   Musculoskeletal: Negative.   Skin: Negative for rash.  Neurological: Negative.  Negative for weakness.  Psychiatric/Behavioral: Negative.  The patient is not nervous/anxious.     As per HPI. Otherwise, a complete review of systems is negative.  PAST MEDICAL HISTORY: Past Medical History:  Diagnosis Date  . Hypercholesteremia   . Hyperlipidemia   . Hypertension   . Osteoporosis   . Thyroid nodule     PAST SURGICAL HISTORY: Past Surgical History:  Procedure Laterality Date  . ABDOMINAL HYSTERECTOMY    . WRIST FRACTURE SURGERY  08/2009   badly broken    FAMILY HISTORY Family History  Problem Relation Age of Onset  .  Heart disease Mother   . Diabetes Father        ADVANCED DIRECTIVES:    HEALTH MAINTENANCE: Social History   Tobacco Use  . Smoking status: Current Every Day Smoker    Packs/day: 0.25    Types: Cigarettes  . Smokeless tobacco: Never Used  . Tobacco comment: occasional smoker  Substance Use Topics  . Alcohol use: No  . Drug use: No     Allergies  Allergen Reactions  . Other Other (See Comments) and Rash    TDAP    Current Outpatient Medications  Medication Sig Dispense Refill  . amLODipine (NORVASC) 2.5 MG tablet Take 2.5 mg by mouth daily.    . Calcium Carbonate-Vitamin D (CALCIUM-VITAMIN D) 500-200 MG-UNIT per tablet Take 2 tablets by mouth daily.    . pravastatin (PRAVACHOL) 10 MG tablet Take 10 mg by mouth daily.    . ondansetron (ZOFRAN) 8 MG tablet Take 1 tablet (8 mg total) 2 (two) times daily as needed by mouth. 30 tablet 2  . prochlorperazine (COMPAZINE) 10 MG tablet Take 1 tablet (10 mg total) every 6 (six) hours as needed by mouth (Nausea or vomiting). 60 tablet 2   No current facility-administered medications for this visit.    Facility-Administered Medications Ordered in Other Visits  Medication Dose Route Frequency Provider Last Rate Last Dose  . heparin lock flush 100 unit/mL  250 Units Intracatheter PRN Leia Alf, MD      . heparin lock flush 100 unit/mL  500 Units Intracatheter PRN Leia Alf, MD      . sodium chloride 0.9 % injection 10 mL  10 mL  Intracatheter PRN Leia Alf, MD      . sodium chloride 0.9 % injection 10 mL  10 mL Intracatheter PRN Leia Alf, MD      . sodium chloride 0.9 % injection 10 mL  10 mL Intracatheter PRN Ma Hillock, Sandeep, MD      . sodium chloride 0.9 % injection 10 mL  10 mL Intracatheter PRN Leia Alf, MD        OBJECTIVE: Vitals:   07/01/17 1041  BP: 126/77  Pulse: 97  Temp: 98.3 F (36.8 C)     Body mass index is 21.44 kg/m.    ECOG FS:0 - Asymptomatic  General: Well-developed,  well-nourished, no acute distress. Eyes: Pink conjunctiva, anicteric sclera. HEENT: Easily palpable lymph node in right cervical chain.   Lungs: Clear to auscultation bilaterally. Heart: Regular rate and rhythm. No rubs, murmurs, or gallops. Abdomen: Soft, nontender, nondistended. No organomegaly noted, normoactive bowel sounds. Musculoskeletal: No edema, cyanosis, or clubbing. Neuro: Alert, answering all questions appropriately. Cranial nerves grossly intact. Skin: No rashes or petechiae noted. Psych: Normal affect.   LAB RESULTS:  No results found for: NA, K, CL, CO2, GLUCOSE, BUN, CREATININE, CALCIUM, PROT, ALBUMIN, AST, ALT, ALKPHOS, BILITOT, GFRNONAA, GFRAA  Lab Results  Component Value Date   WBC 10.2 06/03/2017   NEUTROABS 8.0 (H) 07/21/2016   HGB 15.2 06/03/2017   HCT 45.2 06/03/2017   MCV 90.1 06/03/2017   PLT 259 06/03/2017     STUDIES: Nm Pet Image Initial (pi) Skull Base To Thigh  Result Date: 06/22/2017 CLINICAL DATA:  Initial treatment strategy for squamous cell carcinoma of the neck. EXAM: NUCLEAR MEDICINE PET SKULL BASE TO THIGH TECHNIQUE: 12166 mCi F-18 FDG was injected intravenously. Full-ring PET imaging was performed from the skull base to thigh after the radiotracer. CT data was obtained and used for attenuation correction and anatomic localization. FASTING BLOOD GLUCOSE:  Value: 98 mg/dl COMPARISON:  CT neck 05/08/2017 FINDINGS: NECK: Right level 1B nodal mass has enlarged since the prior study. It measures approximately 3.2 x 2.7 cm on image number 36. It previously measured 19 x 16 mm. Lesion is markedly hypermetabolic with SUV max of 46.2. Slightly smaller level 1 B lymph nodes more superiorly adjacent to the lower margin of the parotid gland are again demonstrated. The 7 mm nodule on image number 28 demonstrates mild hypermetabolism with SUV max of 3.5. A discrete mucosal lesion is not identified. Prominent but symmetric tonsillar tissue with mild symmetric  hypermetabolism but no discrete lesion. CHEST: No hypermetabolic mediastinal or hilar nodes. No suspicious pulmonary nodules on the CT scan. Advanced emphysematous changes and areas of pulmonary scarring. Advanced atherosclerotic calcifications involving the aorta and branch vessels including three-vessel coronary artery calcifications. Small pericardial effusion is noted. ABDOMEN/PELVIS: No abnormal hypermetabolic activity within the liver, pancreas, adrenal glands, or spleen. No hypermetabolic lymph nodes in the abdomen or pelvis. Advanced aortic and iliac artery calcifications but no focal aneurysm. Focal area of hypermetabolism involving the ascending colon corresponds to a 2 cm soft tissue lesion. SUV max is 7.8. This is worrisome for large polyp or early colon cancer. Recommend colonoscopy. SKELETON: No focal hypermetabolic activity to suggest skeletal metastasis. IMPRESSION: 1. Enlarging right level 1B nodal mass with marked hypermetabolism and consistent with known neoplasm. Small adjacent 7 mm nodule is also hypermetabolic. 2. No discrete mucosal lesion is identified. 3. Advanced emphysematous changes involving the lungs along with advanced atherosclerotic calcifications involving the thoracic aorta and coronary arteries but no findings suspicious for  metastatic disease involving the chest. 4. 2 cm hypermetabolic soft tissue lesion involving the ascending colon as described above. Recommend colonoscopy. Electronically Signed   By: Marijo Sanes M.D.   On: 06/22/2017 13:58   [place] Core Biopsy (lymph Nodes)  Result Date: 06/03/2017 INDICATION: 76 year old female with a right submandibular mass. EXAM: Ultrasound-guided core biopsy MEDICATIONS: None. ANESTHESIA/SEDATION: Moderate (conscious) sedation was employed during this procedure. A total of Versed 0.5 mg and Fentanyl 50 mcg was administered intravenously. Moderate Sedation Time: 17 minutes. The patient's level of consciousness and vital signs were  monitored continuously by radiology nursing throughout the procedure under my direct supervision. FLUOROSCOPY TIME:  Fluoroscopy Time: 0 minutes 0 seconds (0 mGy). COMPLICATIONS: None immediate. PROCEDURE: Informed written consent was obtained from the patient after a thorough discussion of the procedural risks, benefits and alternatives. All questions were addressed. A timeout was performed prior to the initiation of the procedure. The right submandibular region was interrogated with ultrasound. There is a heterogeneous 1.9 x 1.4 x 1.9 cm mass within the right submandibular gland. A suitable skin entry site was selected and marked. The region was sterilely prepped and draped in standard fashion using chlorhexidine skin prep. Local anesthesia was attained by infiltration with 1% lidocaine. A small dermatotomy was made. Under real-time sonographic guidance, multiple 18 gauge core biopsies were obtained using a [employer] automated biopsy device. Real-time ultrasound was used to confirm needle placement within the mass on all needle passes. Biopsy specimens were placed on a moist Telfa pad and delivered to pathology for further evaluation. There was no complication. The patient tolerated the procedure well. IMPRESSION: Ultrasound-guided core biopsy of right submandibular mass. Electronically Signed   By: Jacqulynn Cadet M.D.   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  Patient was also discussed with ENT as well as at cancer conference.  She will benefit from concurrent chemotherapy using weekly cisplatin along with daily XRT.  Patient was given a referral to radiation oncology today.  She would like to delay treatment until after the Thanksgiving holiday, therefore she will return to clinic on July 14, 2017 to initiate cycle 1 of weekly  cisplatin.  Plan to do 5-8 cycles and then reimage 3 months after the completion of treatment with PET scan. 2.  PET positive colon lesion: Patient has colonoscopy scheduled for tomorrow.  Approximately 60 minutes was spent in discussion of which greater than 50% was consultation.   Patient was seen and evaluated independently and I agree with the assessment and plan as dictated above.  Please refer patient back if her hemoglobin increases.  [name], MD 07/01/17 3:20 PM

## 2017-07-01 NOTE — Progress Notes (Signed)
START ON PATHWAY REGIMEN - Head and Neck     Administer weekly:     Cisplatin   **Always confirm dose/schedule in your pharmacy ordering system**    Patient Characteristics: Occult Primary, HPV Negative/Unknown, Unresectable Disease Classification: Occult Primary HPV Status: Awaiting Test Results Current Disease Status: No Distant Metastases and No Recurrent Disease AJCC T Category: T0 AJCC N Category: cN2 AJCC M Category: M0 AJCC Stage Grouping: IVA Intent of Therapy: Curative Intent, Discussed with Patient

## 2017-07-01 NOTE — Progress Notes (Signed)
Patient here for follow up. She states that her jaw has been throbbing intermittently.She states her tumor has grown since her last visit.

## 2017-07-02 ENCOUNTER — Encounter
Admission: RE | Disposition: A | Discharge status: Home / Self Care | Payer: Self-pay | Source: Ambulatory Visit | Attending: Gastroenterology

## 2017-07-02 ENCOUNTER — Ambulatory Visit: Payer: PPO | Admitting: Student in an Organized Health Care Education/Training Program

## 2017-07-02 ENCOUNTER — Ambulatory Visit
Admission: RE | Admit: 2017-07-02 | Discharge: 2017-07-02 | Disposition: A | Discharge status: Home / Self Care | Payer: PPO | Source: Ambulatory Visit | Attending: Gastroenterology | Admitting: Gastroenterology

## 2017-07-02 DIAGNOSIS — I700 Atherosclerosis of aorta: Secondary | ICD-10-CM | POA: Diagnosis not present

## 2017-07-02 DIAGNOSIS — Z79899 Other long term (current) drug therapy: Secondary | ICD-10-CM | POA: Insufficient documentation

## 2017-07-02 DIAGNOSIS — F17210 Nicotine dependence, cigarettes, uncomplicated: Secondary | ICD-10-CM | POA: Diagnosis not present

## 2017-07-02 DIAGNOSIS — D122 Benign neoplasm of ascending colon: Secondary | ICD-10-CM | POA: Diagnosis not present

## 2017-07-02 DIAGNOSIS — K635 Polyp of colon: Secondary | ICD-10-CM | POA: Diagnosis not present

## 2017-07-02 DIAGNOSIS — C182 Malignant neoplasm of ascending colon: Secondary | ICD-10-CM | POA: Diagnosis not present

## 2017-07-02 DIAGNOSIS — R948 Abnormal results of function studies of other organs and systems: Secondary | ICD-10-CM | POA: Diagnosis not present

## 2017-07-02 DIAGNOSIS — I313 Pericardial effusion (noninflammatory): Secondary | ICD-10-CM | POA: Insufficient documentation

## 2017-07-02 DIAGNOSIS — I10 Essential (primary) hypertension: Secondary | ICD-10-CM | POA: Insufficient documentation

## 2017-07-02 DIAGNOSIS — E7800 Pure hypercholesterolemia, unspecified: Secondary | ICD-10-CM | POA: Diagnosis not present

## 2017-07-02 DIAGNOSIS — C760 Malignant neoplasm of head, face and neck: Secondary | ICD-10-CM | POA: Diagnosis not present

## 2017-07-02 DIAGNOSIS — C189 Malignant neoplasm of colon, unspecified: Secondary | ICD-10-CM

## 2017-07-02 DIAGNOSIS — R933 Abnormal findings on diagnostic imaging of other parts of digestive tract: Secondary | ICD-10-CM | POA: Diagnosis not present

## 2017-07-02 HISTORY — PX: POLYPECTOMY: SHX5525

## 2017-07-02 HISTORY — PX: COLONOSCOPY WITH PROPOFOL: SHX5780

## 2017-07-02 SURGERY — COLONOSCOPY WITH PROPOFOL
Anesthesia: General

## 2017-07-02 MED ORDER — PROPOFOL 10 MG/ML IV BOLUS
INTRAVENOUS | Status: DC | PRN
  Administered 2017-07-02 (×2): 20 mg via INTRAVENOUS
  Administered 2017-07-02: 70 mg via INTRAVENOUS
  Administered 2017-07-02: 30 mg via INTRAVENOUS
  Administered 2017-07-02 (×2): 20 mg via INTRAVENOUS
  Administered 2017-07-02: 30 mg via INTRAVENOUS
  Administered 2017-07-02: 20 mg via INTRAVENOUS
  Administered 2017-07-02: 30 mg via INTRAVENOUS
  Administered 2017-07-02: 10 mg via INTRAVENOUS
  Administered 2017-07-02: 20 mg via INTRAVENOUS
  Administered 2017-07-02: 30 mg via INTRAVENOUS
  Administered 2017-07-02 (×5): 20 mg via INTRAVENOUS

## 2017-07-02 MED ORDER — SODIUM CHLORIDE 0.9 % IJ SOLN
INTRAMUSCULAR | Status: DC | PRN
  Administered 2017-07-02: 27 mL

## 2017-07-02 MED ORDER — STERILE WATER FOR IRRIGATION IR SOLN
Status: DC | PRN
  Administered 2017-07-02: 08:00:00

## 2017-07-02 MED ORDER — LIDOCAINE HCL (CARDIAC) 20 MG/ML IV SOLN
INTRAVENOUS | Status: DC | PRN
  Administered 2017-07-02: 50 mg via INTRAVENOUS

## 2017-07-02 MED ORDER — LACTATED RINGERS IV SOLN
INTRAVENOUS | Status: DC | PRN
  Administered 2017-07-02: 08:00:00 via INTRAVENOUS

## 2017-07-02 SURGICAL SUPPLY — 24 items
CANISTER SUCT 1200ML W/VALVE (MISCELLANEOUS) ×3 IMPLANT
CLIP HMST 235XBRD CATH ROT (MISCELLANEOUS) ×3 IMPLANT
CLIP RESOLUTION 360 11X235 (MISCELLANEOUS) ×6
FCP ESCP3.2XJMB 240X2.8X (MISCELLANEOUS)
FORCEPS BIOP RAD 4 LRG CAP 4 (CUTTING FORCEPS) IMPLANT
FORCEPS BIOP RJ4 240 W/NDL (MISCELLANEOUS)
FORCEPS ESCP3.2XJMB 240X2.8X (MISCELLANEOUS) IMPLANT
GOWN CVR UNV OPN BCK APRN NK (MISCELLANEOUS) ×2 IMPLANT
GOWN ISOL THUMB LOOP REG UNIV (MISCELLANEOUS) ×4
INJECTOR VARIJECT VIN23 (MISCELLANEOUS) ×1 IMPLANT
KIT DEFENDO VALVE AND CONN (KITS) IMPLANT
KIT ENDO PROCEDURE OLY (KITS) ×3 IMPLANT
MARKER SPOT ENDO TATTOO 5ML (MISCELLANEOUS) IMPLANT
PAD GROUND ADULT SPLIT (MISCELLANEOUS) ×3 IMPLANT
PROBE APC STR FIRE (PROBE) IMPLANT
RETRIEVER NET ROTH 2.5X230 LF (MISCELLANEOUS) ×3 IMPLANT
SNARE SHORT THROW 13M SML OVAL (MISCELLANEOUS) ×3 IMPLANT
SNARE SHORT THROW 30M LRG OVAL (MISCELLANEOUS) IMPLANT
SNARE SNG USE RND 15MM (INSTRUMENTS) IMPLANT
SNARE SPIRAL (MISCELLANEOUS) ×3 IMPLANT
SPOT EX ENDOSCOPIC TATTOO (MISCELLANEOUS)
TRAP ETRAP POLY (MISCELLANEOUS) ×3 IMPLANT
VARIJECT INJECTOR VIN23 (MISCELLANEOUS) ×3
WATER STERILE IRR 250ML POUR (IV SOLUTION) ×3 IMPLANT

## 2017-07-02 NOTE — Anesthesia Postprocedure Evaluation (Signed)
Anesthesia Post Note  Patient: Ashiya Kinkead [facility]  Procedure(s) Performed: COLONOSCOPY WITH PROPOFOL (N/A ) POLYPECTOMY (N/A )  Patient location during evaluation: PACU Anesthesia Type: General Level of consciousness: awake Pain management: pain level controlled Vital Signs Assessment: post-procedure vital signs reviewed and stable Respiratory status: spontaneous breathing Cardiovascular status: blood pressure returned to baseline Postop Assessment: no headache Anesthetic complications: no    Lavonna Monarch

## 2017-07-02 NOTE — Interval H&P Note (Signed)
History and Physical Interval Note:  07/02/2017 7:15 AM  Dana Perez  has presented today for surgery, with the diagnosis of Abnormal PET scan of the colon R94.8  The various methods of treatment have been discussed with the patient and family. After consideration of risks, benefits and other options for treatment, the patient has consented to  Procedure(s): COLONOSCOPY WITH PROPOFOL (N/A) as a surgical intervention .  The patient's history has been reviewed, patient examined, no change in status, stable for surgery.  I have reviewed the patient's chart and labs.  Questions were answered to the patient's satisfaction.     Cheyna Retana [employer]

## 2017-07-02 NOTE — Anesthesia Procedure Notes (Signed)
Performed by: Yicel Shannon, CRNA Pre-anesthesia Checklist: Patient identified, Emergency Drugs available, Suction available, Timeout performed and Patient being monitored Patient Re-evaluated:Patient Re-evaluated prior to induction Oxygen Delivery Method: Nasal cannula Placement Confirmation: positive ETCO2       

## 2017-07-02 NOTE — Anesthesia Preprocedure Evaluation (Addendum)
Anesthesia Evaluation  Patient identified by MRN, date of birth, ID band Patient awake  General Assessment Comment:HEENT malignancy  Reviewed: Allergy & Precautions, NPO status , Patient's Chart, lab work & pertinent test results, reviewed documented beta blocker date and time   Airway Mallampati: III  TM Distance: >3 FB Neck ROM: Full  Mouth opening: Limited Mouth Opening  Dental no notable dental hx.    Pulmonary Current Smoker,    Pulmonary exam normal breath sounds clear to auscultation       Cardiovascular hypertension, Normal cardiovascular exam Rhythm:Regular Rate:Normal     Neuro/Psych negative neurological ROS  negative psych ROS   GI/Hepatic Neg liver ROS, Colon lesion on PET   Endo/Other  negative endocrine ROS  Renal/GU negative Renal ROS  negative genitourinary   Musculoskeletal negative musculoskeletal ROS (+)   Abdominal Normal abdominal exam  (+)  Abdomen: soft.    Peds  Hematology negative hematology ROS (+)   Anesthesia Other Findings   Reproductive/Obstetrics                            Anesthesia Physical Anesthesia Plan  ASA: III  Anesthesia Plan: General   Post-op Pain Management:    Induction: Intravenous  PONV Risk Score and Plan:   Airway Management Planned: Nasal Cannula and Natural Airway  Additional Equipment: None  Intra-op Plan:   Post-operative Plan:   Informed Consent: I have reviewed the patients History and Physical, chart, labs and discussed the procedure including the risks, benefits and alternatives for the proposed anesthesia with the patient or authorized representative who has indicated his/her understanding and acceptance.     Plan Discussed with: CRNA, Anesthesiologist and Surgeon  Anesthesia Plan Comments:         Anesthesia Quick Evaluation

## 2017-07-02 NOTE — Op Note (Signed)
[facility] Gastroenterology Patient Name: Dana Perez Procedure Date: 07/02/2017 7:57 AM MRN: [id number] Account #: [id number] Date of Birth: 1941/03/16 Admit Type: Outpatient Age: 76 Room: [facility] OR ROOM 01 Gender: Female Note Status: Finalized Procedure:            Colonoscopy Indications:          Abnormal PET scan of the GI tract Providers:            Lucilla Lame MD, MD Referring MD:         Glendon Axe (Referring MD) Medicines:            Propofol per Anesthesia Complications:        No immediate complications. Procedure:            Pre-Anesthesia Assessment:                       - Prior to the procedure, a History and Physical was                        performed, and patient medications and allergies were                        reviewed. The patient's tolerance of previous                        anesthesia was also reviewed. The risks and benefits of                        the procedure and the sedation options and risks were                        discussed with the patient. All questions were                        answered, and informed consent was obtained. Prior                        Anticoagulants: The patient has taken no previous                        anticoagulant or antiplatelet agents. ASA Grade                        Assessment: II - A patient with mild systemic disease.                        After reviewing the risks and benefits, the patient was                        deemed in satisfactory condition to undergo the                        procedure.                       After obtaining informed consent, the colonoscope was                        passed under direct vision. Throughout the procedure,  the patient's blood pressure, pulse, and oxygen                        saturations were monitored continuously. The Olympus                        PCF H180AL Colonoscope (S#: [id number]) was introduced         through the anus and advanced to the the cecum,                        identified by appendiceal orifice and ileocecal valve.                        The colonoscopy was performed without difficulty. The                        patient tolerated the procedure well. The quality of                        the bowel preparation was excellent. Findings:      The perianal and digital rectal examinations were normal.      A 20 mm polyp was found in the ascending colon. The polyp was sessile.       Area was successfully injected with 20 mL saline for a lift polypectomy.       The polyp was removed with a hot snare. Resection and retrieval were       complete. To prevent bleeding post-intervention, three hemostatic clips       were successfully placed (MR conditional). There was no bleeding at the       end of the procedure. Impression:           - One 20 mm polyp in the ascending colon, removed with                        a hot snare. Resected and retrieved. Injected. Clips                        (MR conditional) were placed. Recommendation:       - Await pathology results.                       - Repeat colonoscopy in 3 months if lesion not cancer.                        If cancer then will need surgical resection. Procedure Code(s):    --- Professional ---                       [phone], Colonoscopy, flexible; with removal of tumor(s),                        polyp(s), or other lesion(s) by snare technique                       [id number], Colonoscopy, flexible; with directed submucosal                        injection(s), any substance Diagnosis Code(s):    --- Professional ---  R93.3, Abnormal findings on diagnostic imaging of other                        parts of digestive tract                       D12.2, Benign neoplasm of ascending colon CPT copyright 2016 American Medical Association. All rights reserved. The codes documented in this report are preliminary and upon  coder review may  be revised to meet current compliance requirements. Lucilla Lame MD, MD 07/02/2017 8:40:22 AM This report has been signed electronically. Number of Addenda: 0 Note Initiated On: 07/02/2017 7:57 AM Scope Withdrawal Time: 0 hours 28 minutes 34 seconds  Total Procedure Duration: 0 hours 32 minutes 57 seconds       [facility]

## 2017-07-02 NOTE — Transfer of Care (Signed)
Immediate Anesthesia Transfer of Care Note  Patient: Dana Perez [facility]  Procedure(s) Performed: COLONOSCOPY WITH PROPOFOL (N/A ) POLYPECTOMY (N/A )  Patient Location: PACU  Anesthesia Type: General  Level of Consciousness: awake, alert  and patient cooperative  Airway and Oxygen Therapy: Patient Spontanous Breathing and Patient connected to supplemental oxygen  Post-op Assessment: Post-op Vital signs reviewed, Patient's Cardiovascular Status Stable, Respiratory Function Stable, Patent Airway and No signs of Nausea or vomiting  Post-op Vital Signs: Reviewed and stable  Complications: No apparent anesthesia complications

## 2017-07-03 ENCOUNTER — Encounter: Payer: Self-pay | Admitting: Gastroenterology

## 2017-07-03 NOTE — Patient Instructions (Signed)
Cisplatin injection  What is this medicine?  CISPLATIN (SIS pla tin) is a chemotherapy drug. It targets fast dividing cells, like cancer cells, and causes these cells to die. This medicine is used to treat many types of cancer like bladder, ovarian, and testicular cancers.  This medicine may be used for other purposes; ask your health care provider or pharmacist if you have questions.  COMMON BRAND NAME(S): Platinol, Platinol -AQ  What should I tell my health care provider before I take this medicine?  They need to know if you have any of these conditions:  -blood disorders  -hearing problems  -kidney disease  -recent or ongoing radiation therapy  -an unusual or allergic reaction to cisplatin, carboplatin, other chemotherapy, other medicines, foods, dyes, or preservatives  -pregnant or trying to get pregnant  -breast-feeding  How should I use this medicine?  This drug is given as an infusion into a vein. It is administered in a hospital or clinic by a specially trained health care professional.  Talk to your pediatrician regarding the use of this medicine in children. Special care may be needed.  Overdosage: If you think you have taken too much of this medicine contact a poison control center or emergency room at once.  NOTE: This medicine is only for you. Do not share this medicine with others.  What if I miss a dose?  It is important not to miss a dose. Call your doctor or health care professional if you are unable to keep an appointment.  What may interact with this medicine?  -dofetilide  -foscarnet  -medicines for seizures  -medicines to increase blood counts like filgrastim, pegfilgrastim, sargramostim  -probenecid  -pyridoxine used with altretamine  -rituximab  -some antibiotics like amikacin, gentamicin, neomycin, polymyxin B, streptomycin, tobramycin  -sulfinpyrazone  -vaccines  -zalcitabine  Talk to your doctor or health care professional before taking any of these  medicines:  -acetaminophen  -aspirin  -ibuprofen  -ketoprofen  -naproxen  This list may not describe all possible interactions. Give your health care provider a list of all the medicines, herbs, non-prescription drugs, or dietary supplements you use. Also tell them if you smoke, drink alcohol, or use illegal drugs. Some items may interact with your medicine.  What should I watch for while using this medicine?  Your condition will be monitored carefully while you are receiving this medicine. You will need important blood work done while you are taking this medicine.  This drug may make you feel generally unwell. This is not uncommon, as chemotherapy can affect healthy cells as well as cancer cells. Report any side effects. Continue your course of treatment even though you feel ill unless your doctor tells you to stop.  In some cases, you may be given additional medicines to help with side effects. Follow all directions for their use.  Call your doctor or health care professional for advice if you get a fever, chills or sore throat, or other symptoms of a cold or flu. Do not treat yourself. This drug decreases your body's ability to fight infections. Try to avoid being around people who are sick.  This medicine may increase your risk to bruise or bleed. Call your doctor or health care professional if you notice any unusual bleeding.  Be careful brushing and flossing your teeth or using a toothpick because you may get an infection or bleed more easily. If you have any dental work done, tell your dentist you are receiving this medicine.  Avoid taking products   that contain aspirin, acetaminophen, ibuprofen, naproxen, or ketoprofen unless instructed by your doctor. These medicines may hide a fever.  Do not become pregnant while taking this medicine. Women should inform their doctor if they wish to become pregnant or think they might be pregnant. There is a potential for serious side effects to an unborn child. Talk to  your health care professional or pharmacist for more information. Do not breast-feed an infant while taking this medicine.  Drink fluids as directed while you are taking this medicine. This will help protect your kidneys.  Call your doctor or health care professional if you get diarrhea. Do not treat yourself.  What side effects may I notice from receiving this medicine?  Side effects that you should report to your doctor or health care professional as soon as possible:  -allergic reactions like skin rash, itching or hives, swelling of the face, lips, or tongue  -signs of infection - fever or chills, cough, sore throat, pain or difficulty passing urine  -signs of decreased platelets or bleeding - bruising, pinpoint red spots on the skin, black, tarry stools, nosebleeds  -signs of decreased red blood cells - unusually weak or tired, fainting spells, lightheadedness  -breathing problems  -changes in hearing  -gout pain  -low blood counts - This drug may decrease the number of white blood cells, red blood cells and platelets. You may be at increased risk for infections and bleeding.  -nausea and vomiting  -pain, swelling, redness or irritation at the injection site  -pain, tingling, numbness in the hands or feet  -problems with balance, movement  -trouble passing urine or change in the amount of urine  Side effects that usually do not require medical attention (report to your doctor or health care professional if they continue or are bothersome):  -changes in vision  -loss of appetite  -metallic taste in the mouth or changes in taste  This list may not describe all possible side effects. Call your doctor for medical advice about side effects. You may report side effects to FDA at 1-800-FDA-1088.  Where should I keep my medicine?  This drug is given in a hospital or clinic and will not be stored at home.  NOTE: This sheet is a summary. It may not cover all possible information. If you have questions about this medicine,  talk to your doctor, pharmacist, or health care provider.   2018 Elsevier/Gold Standard (2007-11-09 14:40:54)

## 2017-07-06 ENCOUNTER — Ambulatory Visit: Payer: Self-pay | Admitting: Oncology

## 2017-07-06 ENCOUNTER — Encounter: Payer: Self-pay | Admitting: Radiation Oncology

## 2017-07-06 ENCOUNTER — Other Ambulatory Visit: Payer: Self-pay | Admitting: Pathology

## 2017-07-06 ENCOUNTER — Other Ambulatory Visit: Payer: Self-pay

## 2017-07-06 ENCOUNTER — Ambulatory Visit
Admission: RE | Admit: 2017-07-06 | Discharge: 2017-07-06 | Disposition: A | Discharge status: Home / Self Care | Payer: PPO | Source: Ambulatory Visit | Attending: Radiation Oncology | Admitting: Radiation Oncology

## 2017-07-06 VITALS — BP 156/79 | HR 91 | Temp 98.7°F | Resp 18 | Wt 124.4 lb

## 2017-07-06 DIAGNOSIS — F17210 Nicotine dependence, cigarettes, uncomplicated: Secondary | ICD-10-CM | POA: Insufficient documentation

## 2017-07-06 DIAGNOSIS — Z79899 Other long term (current) drug therapy: Secondary | ICD-10-CM | POA: Insufficient documentation

## 2017-07-06 DIAGNOSIS — E785 Hyperlipidemia, unspecified: Secondary | ICD-10-CM | POA: Insufficient documentation

## 2017-07-06 DIAGNOSIS — M810 Age-related osteoporosis without current pathological fracture: Secondary | ICD-10-CM | POA: Insufficient documentation

## 2017-07-06 DIAGNOSIS — C760 Malignant neoplasm of head, face and neck: Secondary | ICD-10-CM

## 2017-07-06 DIAGNOSIS — E7800 Pure hypercholesterolemia, unspecified: Secondary | ICD-10-CM | POA: Insufficient documentation

## 2017-07-06 DIAGNOSIS — E041 Nontoxic single thyroid nodule: Secondary | ICD-10-CM | POA: Diagnosis not present

## 2017-07-06 DIAGNOSIS — I10 Essential (primary) hypertension: Secondary | ICD-10-CM | POA: Insufficient documentation

## 2017-07-06 DIAGNOSIS — Z510 Encounter for antineoplastic radiation therapy: Secondary | ICD-10-CM | POA: Diagnosis not present

## 2017-07-06 DIAGNOSIS — G893 Neoplasm related pain (acute) (chronic): Secondary | ICD-10-CM | POA: Diagnosis not present

## 2017-07-06 LAB — SURGICAL PATHOLOGY

## 2017-07-06 NOTE — Consult Note (Signed)
NEW PATIENT EVALUATION  Name: Dana Perez  MRN: [id number]  Date:   07/06/2017     DOB: 08/17/1941   This 76 y.o. female patient presents to the clinic for initial evaluation of squamous cell carcinoma of unknown primary presenting as right submandibular mass.  REFERRING PHYSICIAN: Glendon Axe, MD  CHIEF COMPLAINT:  Chief Complaint  Patient presents with  . Cancer    Pt is here for initial consultation of H&N cancer.      DIAGNOSIS: The encounter diagnosis was Squamous cell carcinoma of head and neck ([place] [place]).   PREVIOUS INVESTIGATIONS:  Pathology report reviewed PET CT scan reviewed Medical notes reviewed  HPI: Patient is a 76 year old female who presented over the past several months with an increasing mass in her right submandibular region. She eventually saw ENT who performed the biopsy which was positive for keratinizing squamous cell carcinoma. PET CT scan demonstrated hypermetabolic activity in right level Ib nodal metastasis consistent with known malignancy. There is also an adjacent 7 mm nodule also hypermetabolic. Interestingly there was some mild hypermetabolic activity in the tonsillar region although no specific mucosal abnormality in the base of tongue nasopharynx or tonsil region was demonstrated. Case was presented at our weekly tumor conference and decision has been made to go ahead with chemoradiation ending curative fashion. Patient is seen today and doing fairly well she states the mass is tender and causes occasional intermittent pain. She is swallowing well.  PLANNED TREATMENT REGIMEN: Concurrent chemoradiation  PAST MEDICAL HISTORY:  has a past medical history of Hypercholesteremia, Hyperlipidemia, Hypertension, Osteoporosis, and Thyroid nodule.    PAST SURGICAL HISTORY:  Past Surgical History:  Procedure Laterality Date  . ABDOMINAL HYSTERECTOMY    . COLONOSCOPY WITH PROPOFOL N/A 07/02/2017   Performed by Lucilla Lame, MD at [place]  .  POLYPECTOMY N/A 07/02/2017   Performed by Lucilla Lame, MD at [place]  . WRIST FRACTURE SURGERY  08/2009   badly broken    FAMILY HISTORY: family history includes Diabetes in her father; Heart disease in her mother.  SOCIAL HISTORY:  reports that she has been smoking cigarettes.  She has been smoking about 0.25 packs per day. she has never used smokeless tobacco. She reports that she does not drink alcohol or use drugs.  ALLERGIES: Other  MEDICATIONS:  Current Outpatient Medications  Medication Sig Dispense Refill  . amLODipine (NORVASC) 2.5 MG tablet Take 2.5 mg by mouth daily.    . Calcium Carbonate-Vitamin D (CALCIUM-VITAMIN D) 500-200 MG-UNIT per tablet Take 2 tablets by mouth daily.    . ondansetron (ZOFRAN) 8 MG tablet Take 1 tablet (8 mg total) 2 (two) times daily as needed by mouth. 30 tablet 2  . pravastatin (PRAVACHOL) 10 MG tablet Take 10 mg by mouth daily.    . prochlorperazine (COMPAZINE) 10 MG tablet Take 1 tablet (10 mg total) every 6 (six) hours as needed by mouth (Nausea or vomiting). 60 tablet 2   No current facility-administered medications for this encounter.    Facility-Administered Medications Ordered in Other Encounters  Medication Dose Route Frequency Provider Last Rate Last Dose  . heparin lock flush 100 unit/mL  250 Units Intracatheter PRN Leia Alf, MD      . heparin lock flush 100 unit/mL  500 Units Intracatheter PRN Leia Alf, MD      . sodium chloride 0.9 % injection 10 mL  10 mL Intracatheter PRN Leia Alf, MD      . sodium chloride 0.9 %  injection 10 mL  10 mL Intracatheter PRN Leia Alf, MD      . sodium chloride 0.9 % injection 10 mL  10 mL Intracatheter PRN Ma Hillock, Sandeep, MD      . sodium chloride 0.9 % injection 10 mL  10 mL Intracatheter PRN Leia Alf, MD        ECOG PERFORMANCE STATUS:  1 - Symptomatic but completely ambulatory  REVIEW OF SYSTEMS:  Patient denies any weight loss, fatigue, weakness,  fever, chills or night sweats. Patient denies any loss of vision, blurred vision. Patient denies any ringing  of the ears or hearing loss. No irregular heartbeat. Patient denies heart murmur or history of fainting. Patient denies any chest pain or pain radiating to her upper extremities. Patient denies any shortness of breath, difficulty breathing at night, cough or hemoptysis. Patient denies any swelling in the lower legs. Patient denies any nausea vomiting, vomiting of blood, or coffee ground material in the vomitus. Patient denies any stomach pain. Patient states has had normal bowel movements no significant constipation or diarrhea. Patient denies any dysuria, hematuria or significant nocturia. Patient denies any problems walking, swelling in the joints or loss of balance. Patient denies any skin changes, loss of hair or loss of weight. Patient denies any excessive worrying or anxiety or significant depression. Patient denies any problems with insomnia. Patient denies excessive thirst, polyuria, polydipsia. Patient denies any swollen glands, patient denies easy bruising or easy bleeding. Patient denies any recent infections, allergies or URI. Patient "s visual fields have not changed significantly in recent time.    PHYSICAL EXAM: BP (!) 156/79   Pulse 91   Temp 98.7 F (37.1 C)   Resp 18   Wt 124 lb 7.2 oz (56.4 kg)   BMI 21.36 kg/m  Oral cavity is clear no oral mucosal lesions are identified tonsillar region base of tongue within normal limits indirect mirror examination shows upper airway clear vallecula and base of tongue within normal limits. Neck shows a fixed mass in the right submandibular region consistent with known primary. No other evidence of cervical supraclavicular adenopathy is identified. Well-developed well-nourished patient in NAD. HEENT reveals PERLA, EOMI, discs not visualized.  Oral cavity is clear. No oral mucosal lesions are identified. Neck is clear without evidence of  cervical or supraclavicular adenopathy. Lungs are clear to A&P. Cardiac examination is essentially unremarkable with regular rate and rhythm without murmur rub or thrill. Abdomen is benign with no organomegaly or masses noted. Motor sensory and DTR levels are equal and symmetric in the upper and lower extremities. Cranial nerves II through XII are grossly intact. Proprioception is intact. No peripheral adenopathy or edema is identified. No motor or sensory levels are noted. Crude visual fields are within normal range.  LABORATORY DATA: Pathology reports reviewed    RADIOLOGY RESULTS: PET CT scan is reviewed   IMPRESSION: Squamous cell carcinoma the right cervical chain of unknown primary head and neck cancer  PLAN: At this time like to go ahead with concurrent chemoradiation. Would plan on delivering 7000 cGy to the area of the cervical lesion. I would also use dose painting I MRT technique to cover base of tongue and tonsil region as well as other lymph nodes in her head and neck region. I would choose I MRT to spare critical structures such as a salivary glands spinal cord. Risks and benefits of treatment including increased xerostomia alteration of taste alteration of blood counts skin reaction fatigue possible oral mucositis all were discussed  in detail with the patient.There will be extra effort by both professional staff as well as technical staff to coordinate and manage concurrent chemoradiation and ensuing side effects during her treatments. I personally set up and ordered CT simulation after the holiday weekend. I will coordinate chemotherapy with medical oncology. Patient may need salvage surgery of the right neck should be residual disease after completion of concurrent chemoradiation. All of this was explained in detail to the patient and her husband. They both seem to comprehend my treatment plan well.  I would like to take this opportunity to thank you for allowing me to participate in the  care of your patient.Armstead Peaks., MD

## 2017-07-07 ENCOUNTER — Inpatient Hospital Stay: Payer: PPO

## 2017-07-08 ENCOUNTER — Telehealth: Payer: Self-pay

## 2017-07-08 NOTE — Telephone Encounter (Signed)
Dr. Allen Norris to discuss with Dr. Grayland Ormond about results. Pt has appts on Monday 26th and Tuesday 27th.

## 2017-07-08 NOTE — Telephone Encounter (Signed)
-----   Message from Lucilla Lame, MD sent at 07/07/2017  9:10 AM EST ----- Please have the patient come in for a follow up asap. The biopsies were cancerous.

## 2017-07-12 NOTE — Progress Notes (Signed)
Colon  Telephone:([phone]) [phone] Fax:(336) [phone]  ID: Dana Perez OB: [birth year]  MR#: [id number]  FXT#:[id number]  Patient Care Team: Glendon Axe, MD as PCP - General (Internal Medicine)  CHIEF COMPLAINT: Stage IVa head and neck squamous cell carcinoma, adenocarcinoma of the colon.  INTERVAL HISTORY: Patient returns to clinic today to initiate cycle 1 of weekly cisplatin as well as discussion of her colonoscopy results. She currently feels well and is asymptomatic. She has no neurologic complaints. She does not complain of weakness or fatigue.  She denies any dysphagia.  She has no chest pain or shortness of breath. She denies any nausea, vomiting, constipation, or diarrhea.  She has no melena or hematochezia.  She has no urinary complaints. Patient offers no specific complaints today.  REVIEW OF SYSTEMS:   Review of Systems  Constitutional: Negative.  Negative for fever, malaise/fatigue and weight loss.  HENT: Negative.  Negative for sore throat.   Respiratory: Negative.  Negative for cough and shortness of breath.   Cardiovascular: Negative.  Negative for chest pain and leg swelling.  Gastrointestinal: Negative.  Negative for abdominal pain.  Genitourinary: Negative.   Musculoskeletal: Negative.   Skin: Negative for rash.  Neurological: Negative.  Negative for weakness.  Psychiatric/Behavioral: Negative.  The patient is not nervous/anxious.     As per HPI. Otherwise, a complete review of systems is negative.  PAST MEDICAL HISTORY: Past Medical History:  Diagnosis Date  . Hypercholesteremia   . Hyperlipidemia   . Hypertension   . Osteoporosis   . Thyroid nodule     PAST SURGICAL HISTORY: Past Surgical History:  Procedure Laterality Date  . ABDOMINAL HYSTERECTOMY    . COLONOSCOPY WITH PROPOFOL N/A 07/02/2017   Procedure: COLONOSCOPY WITH PROPOFOL;  Surgeon: Lucilla Lame, MD;  Location: [facility];  Service: Endoscopy;   Laterality: N/A;  . POLYPECTOMY N/A 07/02/2017   Procedure: POLYPECTOMY;  Surgeon: Lucilla Lame, MD;  Location: [facility];  Service: Endoscopy;  Laterality: N/A;  . WRIST FRACTURE SURGERY  08/2009   badly broken    FAMILY HISTORY Family History  Problem Relation Age of Onset  . Heart disease Mother   . Diabetes Father        ADVANCED DIRECTIVES:    HEALTH MAINTENANCE: Social History   Tobacco Use  . Smoking status: Current Every Day Smoker    Packs/day: 0.25    Types: Cigarettes  . Smokeless tobacco: Never Used  . Tobacco comment: occasional smoker  Substance Use Topics  . Alcohol use: No  . Drug use: No     Allergies  Allergen Reactions  . Other Other (See Comments) and Rash    TDAP    Current Outpatient Medications  Medication Sig Dispense Refill  . amLODipine (NORVASC) 2.5 MG tablet Take 2.5 mg by mouth daily.    . Calcium Carbonate-Vitamin D (CALCIUM-VITAMIN D) 500-200 MG-UNIT per tablet Take 2 tablets by mouth daily.    . pravastatin (PRAVACHOL) 10 MG tablet Take 10 mg by mouth daily.    . ondansetron (ZOFRAN) 8 MG tablet Take 1 tablet (8 mg total) 2 (two) times daily as needed by mouth. (Patient not taking: Reported on 07/14/2017) 30 tablet 2  . prochlorperazine (COMPAZINE) 10 MG tablet Take 1 tablet (10 mg total) every 6 (six) hours as needed by mouth (Nausea or vomiting). (Patient not taking: Reported on 07/14/2017) 60 tablet 2   No current facility-administered medications for this visit.    Facility-Administered Medications Ordered in  Other Visits  Medication Dose Route Frequency Provider Last Rate Last Dose  . 0.9 %  sodium chloride infusion   Intravenous Once [name], MD      . CISplatin (PLATINOL) 64 mg in sodium chloride 0.9 % 250 mL chemo infusion  40 mg/m2 (Treatment Plan Recorded) Intravenous Once [name], MD      . dextrose 5 % and 0.45% NaCl 1,000 mL with potassium chloride 20 mEq, magnesium sulfate 12 mEq  infusion   Intravenous Once [name], MD      . fosaprepitant (EMEND) 150 mg, dexamethasone (DECADRON) 12 mg in sodium chloride 0.9 % 145 mL IVPB   Intravenous Once [name], MD      . heparin lock flush 100 unit/mL  250 Units Intracatheter PRN Leia Alf, MD      . heparin lock flush 100 unit/mL  500 Units Intracatheter PRN Leia Alf, MD      . palonosetron (ALOXI) injection 0.25 mg  0.25 mg Intravenous Once [name], MD      . sodium chloride 0.9 % injection 10 mL  10 mL Intracatheter PRN Leia Alf, MD      . sodium chloride 0.9 % injection 10 mL  10 mL Intracatheter PRN Leia Alf, MD      . sodium chloride 0.9 % injection 10 mL  10 mL Intracatheter PRN Ma Hillock, Sandeep, MD      . sodium chloride 0.9 % injection 10 mL  10 mL Intracatheter PRN Leia Alf, MD        OBJECTIVE: Vitals:   07/14/17 1049  BP: (!) 148/70  Pulse: 77  Resp: 18  Temp: 97.8 F (36.6 C)     Body mass index is 21.28 kg/m.    ECOG FS:0 - Asymptomatic  General: Well-developed, well-nourished, no acute distress. Eyes: Pink conjunctiva, anicteric sclera. HEENT: Easily palpable lymph node in right cervical chain.   Lungs: Clear to auscultation bilaterally. Heart: Regular rate and rhythm. No rubs, murmurs, or gallops. Abdomen: Soft, nontender, nondistended. No organomegaly noted, normoactive bowel sounds. Musculoskeletal: No edema, cyanosis, or clubbing. Neuro: Alert, answering all questions appropriately. Cranial nerves grossly intact. Skin: No rashes or petechiae noted. Psych: Normal affect.   LAB RESULTS:  Lab Results  Component Value Date   NA 138 07/14/2017   K 4.0 07/14/2017   CL 104 07/14/2017   CO2 26 07/14/2017   GLUCOSE 95 07/14/2017   BUN 13 07/14/2017   CREATININE 0.92 07/14/2017   CALCIUM 8.8 (L) 07/14/2017   GFRNONAA 60 (L) 07/14/2017   GFRAA >60 07/14/2017    Lab Results  Component Value Date   WBC 9.8 07/14/2017    NEUTROABS 7.1 (H) 07/14/2017   HGB 15.6 07/14/2017   HCT 46.4 07/14/2017   MCV 90.3 07/14/2017   PLT 294 07/14/2017     STUDIES: Nm Pet Image Initial (pi) Skull Base To Thigh  Result Date: 06/22/2017 CLINICAL DATA:  Initial treatment strategy for squamous cell carcinoma of the neck. EXAM: NUCLEAR MEDICINE PET SKULL BASE TO THIGH TECHNIQUE: 12166 mCi F-18 FDG was injected intravenously. Full-ring PET imaging was performed from the skull base to thigh after the radiotracer. CT data was obtained and used for attenuation correction and anatomic localization. FASTING BLOOD GLUCOSE:  Value: 98 mg/dl COMPARISON:  CT neck 05/08/2017 FINDINGS: NECK: Right level 1B nodal mass has enlarged since the prior study. It measures approximately 3.2 x 2.7 cm on image number 36. It previously measured 19  x 16 mm. Lesion is markedly hypermetabolic with SUV max of 96.7. Slightly smaller level 1 B lymph nodes more superiorly adjacent to the lower margin of the parotid gland are again demonstrated. The 7 mm nodule on image number 28 demonstrates mild hypermetabolism with SUV max of 3.5. A discrete mucosal lesion is not identified. Prominent but symmetric tonsillar tissue with mild symmetric hypermetabolism but no discrete lesion. CHEST: No hypermetabolic mediastinal or hilar nodes. No suspicious pulmonary nodules on the CT scan. Advanced emphysematous changes and areas of pulmonary scarring. Advanced atherosclerotic calcifications involving the aorta and branch vessels including three-vessel coronary artery calcifications. Small pericardial effusion is noted. ABDOMEN/PELVIS: No abnormal hypermetabolic activity within the liver, pancreas, adrenal glands, or spleen. No hypermetabolic lymph nodes in the abdomen or pelvis. Advanced aortic and iliac artery calcifications but no focal aneurysm. Focal area of hypermetabolism involving the ascending colon corresponds to a 2 cm soft tissue lesion. SUV max is 7.8. This is worrisome for  large polyp or early colon cancer. Recommend colonoscopy. SKELETON: No focal hypermetabolic activity to suggest skeletal metastasis. IMPRESSION: 1. Enlarging right level 1B nodal mass with marked hypermetabolism and consistent with known neoplasm. Small adjacent 7 mm nodule is also hypermetabolic. 2. No discrete mucosal lesion is identified. 3. Advanced emphysematous changes involving the lungs along with advanced atherosclerotic calcifications involving the thoracic aorta and coronary arteries but no findings suspicious for metastatic disease involving the chest. 4. 2 cm hypermetabolic soft tissue lesion involving the ascending colon as described above. Recommend colonoscopy. Electronically Signed   By: Marijo Sanes M.D.   On: 06/22/2017 13:58    ASSESSMENT: Stage IVa head and neck squamous cell carcinoma, adenocarcinoma of the colon  PLAN:    1. 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  Patient was also discussed with ENT as well as at cancer conference.  She will benefit from concurrent chemotherapy using weekly cisplatin along with daily XRT.  Patient will initiate her XRT next week.  Proceed with cycle 1 of weekly cisplatin.  Plan to do 5-8 cycles and then reimage 3 months after the completion of treatment with PET scan.  Return to clinic in 1 week for further evaluation and consideration of cycle 2. 2.  Adenocarcinoma of the colon: Patient has requested to delay surgery or treatment until after her head and neck treatments are complete.  Referral was given to surgery for further evaluation.  CEA pending at time of dictation.  Approximately 30 minutes was spent in discussion of which greater than 50% was consultation.   Patient was seen and evaluated independently and I agree with the assessment and plan as dictated above.  Please refer patient back if her hemoglobin increases.  [name], MD 07/14/17 11:12 AM

## 2017-07-13 ENCOUNTER — Ambulatory Visit
Admission: RE | Admit: 2017-07-13 | Discharge: 2017-07-13 | Disposition: A | Discharge status: Home / Self Care | Payer: PPO | Source: Ambulatory Visit | Attending: Radiation Oncology | Admitting: Radiation Oncology

## 2017-07-13 DIAGNOSIS — Z510 Encounter for antineoplastic radiation therapy: Secondary | ICD-10-CM | POA: Diagnosis not present

## 2017-07-13 DIAGNOSIS — C760 Malignant neoplasm of head, face and neck: Secondary | ICD-10-CM | POA: Diagnosis not present

## 2017-07-13 DIAGNOSIS — F17210 Nicotine dependence, cigarettes, uncomplicated: Secondary | ICD-10-CM | POA: Diagnosis not present

## 2017-07-14 ENCOUNTER — Inpatient Hospital Stay: Payer: PPO | Admitting: Oncology

## 2017-07-14 ENCOUNTER — Inpatient Hospital Stay: Payer: PPO

## 2017-07-14 ENCOUNTER — Other Ambulatory Visit: Payer: Self-pay | Admitting: *Deleted

## 2017-07-14 ENCOUNTER — Other Ambulatory Visit: Payer: Self-pay

## 2017-07-14 VITALS — BP 148/70 | HR 77 | Temp 97.8°F | Resp 18 | Wt 124.0 lb

## 2017-07-14 DIAGNOSIS — F17210 Nicotine dependence, cigarettes, uncomplicated: Secondary | ICD-10-CM

## 2017-07-14 DIAGNOSIS — E785 Hyperlipidemia, unspecified: Secondary | ICD-10-CM | POA: Diagnosis not present

## 2017-07-14 DIAGNOSIS — C189 Malignant neoplasm of colon, unspecified: Secondary | ICD-10-CM | POA: Diagnosis not present

## 2017-07-14 DIAGNOSIS — E079 Disorder of thyroid, unspecified: Secondary | ICD-10-CM

## 2017-07-14 DIAGNOSIS — Z7189 Other specified counseling: Secondary | ICD-10-CM

## 2017-07-14 DIAGNOSIS — Z79899 Other long term (current) drug therapy: Secondary | ICD-10-CM | POA: Diagnosis not present

## 2017-07-14 DIAGNOSIS — M810 Age-related osteoporosis without current pathological fracture: Secondary | ICD-10-CM | POA: Diagnosis not present

## 2017-07-14 DIAGNOSIS — C760 Malignant neoplasm of head, face and neck: Secondary | ICD-10-CM

## 2017-07-14 DIAGNOSIS — I313 Pericardial effusion (noninflammatory): Secondary | ICD-10-CM | POA: Diagnosis not present

## 2017-07-14 DIAGNOSIS — I10 Essential (primary) hypertension: Secondary | ICD-10-CM

## 2017-07-14 DIAGNOSIS — Z5111 Encounter for antineoplastic chemotherapy: Secondary | ICD-10-CM | POA: Diagnosis not present

## 2017-07-14 DIAGNOSIS — I700 Atherosclerosis of aorta: Secondary | ICD-10-CM | POA: Diagnosis not present

## 2017-07-14 DIAGNOSIS — C4442 Squamous cell carcinoma of skin of scalp and neck: Secondary | ICD-10-CM

## 2017-07-14 HISTORY — DX: Other specified counseling: Z71.89

## 2017-07-14 LAB — CBC WITH DIFFERENTIAL/PLATELET
Basophils Absolute: 0.1 10*3/uL (ref 0–0.1)
Basophils Relative: 1 %
Eosinophils Absolute: 0.1 10*3/uL (ref 0–0.7)
Eosinophils Relative: 1 %
HCT: 46.4 % (ref 35.0–47.0)
HEMOGLOBIN: 15.6 g/dL (ref 12.0–16.0)
LYMPHS ABS: 2.2 10*3/uL (ref 1.0–3.6)
LYMPHS PCT: 22 %
MCH: 30.3 pg (ref 26.0–34.0)
MCHC: 33.6 g/dL (ref 32.0–36.0)
MCV: 90.3 fL (ref 80.0–100.0)
MONOS PCT: 4 %
Monocytes Absolute: 0.4 10*3/uL (ref 0.2–0.9)
NEUTROS PCT: 72 %
Neutro Abs: 7.1 10*3/uL — ABNORMAL HIGH (ref 1.4–6.5)
Platelets: 294 10*3/uL (ref 150–440)
RBC: 5.13 MIL/uL (ref 3.80–5.20)
RDW: 12.5 % (ref 11.5–14.5)
WBC: 9.8 10*3/uL (ref 3.6–11.0)

## 2017-07-14 LAB — BASIC METABOLIC PANEL
Anion gap: 8 (ref 5–15)
BUN: 13 mg/dL (ref 6–20)
CHLORIDE: 104 mmol/L (ref 101–111)
CO2: 26 mmol/L (ref 22–32)
Calcium: 8.8 mg/dL — ABNORMAL LOW (ref 8.9–10.3)
Creatinine, Ser: 0.92 mg/dL (ref 0.44–1.00)
GFR calc non Af Amer: 60 mL/min — ABNORMAL LOW (ref >60)
Glucose, Bld: 95 mg/dL (ref 65–99)
POTASSIUM: 4 mmol/L (ref 3.5–5.1)
SODIUM: 138 mmol/L (ref 135–145)

## 2017-07-14 MED ORDER — SODIUM CHLORIDE 0.9 % IV SOLN
Freq: Once | INTRAVENOUS | Status: AC
  Administered 2017-07-14: 12:00:00 via INTRAVENOUS
  Filled 2017-07-14: qty 1000

## 2017-07-14 MED ORDER — SODIUM CHLORIDE 0.9 % IV SOLN
Freq: Once | INTRAVENOUS | Status: AC
  Administered 2017-07-14: 14:00:00 via INTRAVENOUS
  Filled 2017-07-14: qty 5

## 2017-07-14 MED ORDER — PALONOSETRON HCL INJECTION 0.25 MG/5ML
0.2500 mg | Freq: Once | INTRAVENOUS | Status: AC
  Administered 2017-07-14: 0.25 mg via INTRAVENOUS
  Filled 2017-07-14: qty 5

## 2017-07-14 MED ORDER — SODIUM CHLORIDE 0.9 % IV SOLN
40.0000 mg/m2 | Freq: Once | INTRAVENOUS | Status: AC
  Administered 2017-07-14: 64 mg via INTRAVENOUS
  Filled 2017-07-14: qty 64

## 2017-07-14 MED ORDER — POTASSIUM CHLORIDE 2 MEQ/ML IV SOLN
Freq: Once | INTRAVENOUS | Status: AC
  Administered 2017-07-14: 12:00:00 via INTRAVENOUS
  Filled 2017-07-14: qty 1000

## 2017-07-14 NOTE — Progress Notes (Signed)
Pt in today for follow up and first chemotherapy treatment. States went to chemo class.  Denies any concerns or difficulties at this time.

## 2017-07-19 NOTE — Progress Notes (Signed)
[place]  [phone] Fax:(336) [phone]  ID: Dana Perez OB: Jul 12, 1941  MR#: [id number]  YIR#:[id number]  Patient Care Team: Glendon Axe, MD as PCP - General (Internal Medicine)  CHIEF COMPLAINT: Stage IVa head and neck squamous cell carcinoma, adenocarcinoma of the colon.  INTERVAL HISTORY: Patient returns to clinic today to initiate cycle 2 of weekly cisplatin.  Patient initiated her daily XRT today.  She tolerated her first treatment well without significant side effects.  She currently feels well and is asymptomatic. She has no neurologic complaints. She does not complain of weakness or fatigue.  She denies any dysphagia.  She has no chest pain or shortness of breath. She denies any nausea, vomiting, constipation, or diarrhea.  She has no melena or hematochezia.  She has no urinary complaints. Patient offers no specific complaints today.  REVIEW OF SYSTEMS:   Review of Systems  Constitutional: Negative.  Negative for fever, malaise/fatigue and weight loss.  HENT: Negative.  Negative for sore throat.   Respiratory: Negative.  Negative for cough and shortness of breath.   Cardiovascular: Negative.  Negative for chest pain and leg swelling.  Gastrointestinal: Negative.  Negative for abdominal pain.  Genitourinary: Negative.   Musculoskeletal: Negative.   Skin: Negative for rash.  Neurological: Negative.  Negative for weakness.  Psychiatric/Behavioral: Negative.  The patient is not nervous/anxious.     As per HPI. Otherwise, a complete review of systems is negative.  PAST MEDICAL HISTORY: Past Medical History:  Diagnosis Date  . Hypercholesteremia   . Hyperlipidemia   . Hypertension   . Osteoporosis   . Thyroid nodule     PAST SURGICAL HISTORY: Past Surgical History:  Procedure Laterality Date  . ABDOMINAL HYSTERECTOMY    . COLONOSCOPY WITH PROPOFOL N/A 07/02/2017   Procedure: COLONOSCOPY WITH PROPOFOL;  Surgeon: Lucilla Lame,  MD;  Location: [facility];  Service: Endoscopy;  Laterality: N/A;  . POLYPECTOMY N/A 07/02/2017   Procedure: POLYPECTOMY;  Surgeon: Lucilla Lame, MD;  Location: [facility];  Service: Endoscopy;  Laterality: N/A;  . WRIST FRACTURE SURGERY  08/2009   badly broken    FAMILY HISTORY Family History  Problem Relation Age of Onset  . Heart disease Mother   . Diabetes Father        ADVANCED DIRECTIVES:    HEALTH MAINTENANCE: Social History   Tobacco Use  . Smoking status: Current Every Day Smoker    Packs/day: 0.25    Types: Cigarettes  . Smokeless tobacco: Never Used  . Tobacco comment: occasional smoker  Substance Use Topics  . Alcohol use: No  . Drug use: No     Allergies  Allergen Reactions  . Other Other (See Comments) and Rash    TDAP    Current Outpatient Medications  Medication Sig Dispense Refill  . amLODipine (NORVASC) 2.5 MG tablet Take 2.5 mg by mouth daily.    . Calcium Carbonate-Vitamin D (CALCIUM-VITAMIN D) 500-200 MG-UNIT per tablet Take 2 tablets by mouth daily.    . pravastatin (PRAVACHOL) 10 MG tablet Take 10 mg by mouth daily.    . ondansetron (ZOFRAN) 8 MG tablet Take 1 tablet (8 mg total) 2 (two) times daily as needed by mouth. (Patient not taking: Reported on 07/14/2017) 30 tablet 2  . prochlorperazine (COMPAZINE) 10 MG tablet Take 1 tablet (10 mg total) every 6 (six) hours as needed by mouth (Nausea or vomiting). (Patient not taking: Reported on 07/14/2017) 60 tablet 2   No current facility-administered  medications for this visit.    Facility-Administered Medications Ordered in Other Visits  Medication Dose Route Frequency Provider Last Rate Last Dose  . CISplatin (PLATINOL) 64 mg in sodium chloride 0.9 % 250 mL chemo infusion  40 mg/m2 (Treatment Plan Recorded) Intravenous Once [name], MD      . dextrose 5 % and 0.45% NaCl 1,000 mL with potassium chloride 20 mEq, magnesium sulfate 12 mEq infusion   Intravenous Once  [name], MD   Stopped at 07/21/17 1240  . fosaprepitant (EMEND) 150 mg, dexamethasone (DECADRON) 12 mg in sodium chloride 0.9 % 145 mL IVPB   Intravenous Once [name], MD      . heparin lock flush 100 unit/mL  250 Units Intracatheter PRN Leia Alf, MD      . heparin lock flush 100 unit/mL  500 Units Intracatheter PRN Leia Alf, MD      . palonosetron (ALOXI) injection 0.25 mg  0.25 mg Intravenous Once [name], MD      . sodium chloride 0.9 % injection 10 mL  10 mL Intracatheter PRN Leia Alf, MD      . sodium chloride 0.9 % injection 10 mL  10 mL Intracatheter PRN Leia Alf, MD      . sodium chloride 0.9 % injection 10 mL  10 mL Intracatheter PRN Ma Hillock, Sandeep, MD      . sodium chloride 0.9 % injection 10 mL  10 mL Intracatheter PRN Leia Alf, MD        OBJECTIVE: Vitals:   07/21/17 0918  BP: (!) 143/78  Pulse: 80  Resp: 18  Temp: 98 F (36.7 C)     Body mass index is 24.2 kg/m.    ECOG FS:0 - Asymptomatic  General: Well-developed, well-nourished, no acute distress. Eyes: Pink conjunctiva, anicteric sclera. HEENT: Easily palpable lymph node in right cervical chain.   Lungs: Clear to auscultation bilaterally. Heart: Regular rate and rhythm. No rubs, murmurs, or gallops. Abdomen: Soft, nontender, nondistended. No organomegaly noted, normoactive bowel sounds. Musculoskeletal: No edema, cyanosis, or clubbing. Neuro: Alert, answering all questions appropriately. Cranial nerves grossly intact. Skin: No rashes or petechiae noted. Psych: Normal affect.   LAB RESULTS:  Lab Results  Component Value Date   NA 140 07/21/2017   K 3.8 07/21/2017   CL 105 07/21/2017   CO2 26 07/21/2017   GLUCOSE 101 (H) 07/21/2017   BUN 23 (H) 07/21/2017   CREATININE 0.97 07/21/2017   CALCIUM 9.0 07/21/2017   GFRNONAA 56 (L) 07/21/2017   GFRAA >60 07/21/2017    Lab Results  Component Value Date   WBC 11.2 (H) 07/21/2017    NEUTROABS 7.6 (H) 07/21/2017   HGB 15.4 07/21/2017   HCT 45.3 07/21/2017   MCV 89.8 07/21/2017   PLT 277 07/21/2017     STUDIES: Nm Pet Image Initial (pi) Skull Base To Thigh  Result Date: 06/22/2017 CLINICAL DATA:  Initial treatment strategy for squamous cell carcinoma of the neck. EXAM: NUCLEAR MEDICINE PET SKULL BASE TO THIGH TECHNIQUE: 12166 mCi F-18 FDG was injected intravenously. Full-ring PET imaging was performed from the skull base to thigh after the radiotracer. CT data was obtained and used for attenuation correction and anatomic localization. FASTING BLOOD GLUCOSE:  Value: 98 mg/dl COMPARISON:  CT neck 05/08/2017 FINDINGS: NECK: Right level 1B nodal mass has enlarged since the prior study. It measures approximately 3.2 x 2.7 cm on image number 36. It previously measured 19 x 16 mm. Lesion is  markedly hypermetabolic with SUV max of 66.5. Slightly smaller level 1 B lymph nodes more superiorly adjacent to the lower margin of the parotid gland are again demonstrated. The 7 mm nodule on image number 28 demonstrates mild hypermetabolism with SUV max of 3.5. A discrete mucosal lesion is not identified. Prominent but symmetric tonsillar tissue with mild symmetric hypermetabolism but no discrete lesion. CHEST: No hypermetabolic mediastinal or hilar nodes. No suspicious pulmonary nodules on the CT scan. Advanced emphysematous changes and areas of pulmonary scarring. Advanced atherosclerotic calcifications involving the aorta and branch vessels including three-vessel coronary artery calcifications. Small pericardial effusion is noted. ABDOMEN/PELVIS: No abnormal hypermetabolic activity within the liver, pancreas, adrenal glands, or spleen. No hypermetabolic lymph nodes in the abdomen or pelvis. Advanced aortic and iliac artery calcifications but no focal aneurysm. Focal area of hypermetabolism involving the ascending colon corresponds to a 2 cm soft tissue lesion. SUV max is 7.8. This is worrisome for  large polyp or early colon cancer. Recommend colonoscopy. SKELETON: No focal hypermetabolic activity to suggest skeletal metastasis. IMPRESSION: 1. Enlarging right level 1B nodal mass with marked hypermetabolism and consistent with known neoplasm. Small adjacent 7 mm nodule is also hypermetabolic. 2. No discrete mucosal lesion is identified. 3. Advanced emphysematous changes involving the lungs along with advanced atherosclerotic calcifications involving the thoracic aorta and coronary arteries but no findings suspicious for metastatic disease involving the chest. 4. 2 cm hypermetabolic soft tissue lesion involving the ascending colon as described above. Recommend colonoscopy. Electronically Signed   By: Marijo Sanes M.D.   On: 06/22/2017 13:58    ASSESSMENT: Stage IVa head and neck squamous cell carcinoma, adenocarcinoma of the colon  PLAN:    1. 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  Patient was also discussed with ENT as well as at cancer conference.  She will benefit from concurrent chemotherapy using weekly cisplatin along with daily XRT.  Patient initiated daily XRT today.  Proceed with cycle 2 of weekly cisplatin.  Plan to do 5-8 cycles and then reimage 3 months after the completion of treatment with PET scan.  Return to clinic in 1 week for further evaluation and consideration of cycle 3. 2.  Adenocarcinoma of the colon: Patient has requested to delay surgery or treatment until after her head and neck treatments are complete.  She reports her surgical appointment is on July 27, 2017.  CEA pending at time of dictation.  Approximately 30 minutes was spent in discussion of which greater than 50% was consultation.   Patient was seen and evaluated independently and I agree with the assessment and plan as dictated above.  Please refer patient back if her hemoglobin increases.  [name], MD 07/21/17 11:03 AM

## 2017-07-20 ENCOUNTER — Ambulatory Visit
Admission: RE | Admit: 2017-07-20 | Discharge: 2017-07-20 | Disposition: A | Discharge status: Home / Self Care | Payer: PPO | Source: Ambulatory Visit | Attending: Radiation Oncology | Admitting: Radiation Oncology

## 2017-07-20 DIAGNOSIS — F17210 Nicotine dependence, cigarettes, uncomplicated: Secondary | ICD-10-CM | POA: Diagnosis not present

## 2017-07-20 DIAGNOSIS — C760 Malignant neoplasm of head, face and neck: Secondary | ICD-10-CM | POA: Diagnosis not present

## 2017-07-20 DIAGNOSIS — Z510 Encounter for antineoplastic radiation therapy: Secondary | ICD-10-CM | POA: Diagnosis not present

## 2017-07-21 ENCOUNTER — Inpatient Hospital Stay: Payer: PPO

## 2017-07-21 ENCOUNTER — Inpatient Hospital Stay: Payer: PPO | Attending: Oncology | Admitting: Oncology

## 2017-07-21 ENCOUNTER — Ambulatory Visit
Admission: RE | Admit: 2017-07-21 | Discharge: 2017-07-21 | Disposition: A | Discharge status: Home / Self Care | Payer: PPO | Source: Ambulatory Visit | Attending: Radiation Oncology | Admitting: Radiation Oncology

## 2017-07-21 VITALS — BP 143/78 | HR 80 | Temp 98.0°F | Resp 18 | Wt 141.0 lb

## 2017-07-21 DIAGNOSIS — R112 Nausea with vomiting, unspecified: Secondary | ICD-10-CM | POA: Diagnosis not present

## 2017-07-21 DIAGNOSIS — E860 Dehydration: Secondary | ICD-10-CM | POA: Insufficient documentation

## 2017-07-21 DIAGNOSIS — R1310 Dysphagia, unspecified: Secondary | ICD-10-CM | POA: Diagnosis not present

## 2017-07-21 DIAGNOSIS — Z90710 Acquired absence of both cervix and uterus: Secondary | ICD-10-CM | POA: Insufficient documentation

## 2017-07-21 DIAGNOSIS — R5381 Other malaise: Secondary | ICD-10-CM | POA: Insufficient documentation

## 2017-07-21 DIAGNOSIS — R634 Abnormal weight loss: Secondary | ICD-10-CM | POA: Diagnosis not present

## 2017-07-21 DIAGNOSIS — C760 Malignant neoplasm of head, face and neck: Secondary | ICD-10-CM

## 2017-07-21 DIAGNOSIS — R531 Weakness: Secondary | ICD-10-CM | POA: Diagnosis not present

## 2017-07-21 DIAGNOSIS — E7800 Pure hypercholesterolemia, unspecified: Secondary | ICD-10-CM | POA: Diagnosis not present

## 2017-07-21 DIAGNOSIS — R197 Diarrhea, unspecified: Secondary | ICD-10-CM | POA: Insufficient documentation

## 2017-07-21 DIAGNOSIS — Z5111 Encounter for antineoplastic chemotherapy: Secondary | ICD-10-CM | POA: Insufficient documentation

## 2017-07-21 DIAGNOSIS — R630 Anorexia: Secondary | ICD-10-CM | POA: Insufficient documentation

## 2017-07-21 DIAGNOSIS — K5900 Constipation, unspecified: Secondary | ICD-10-CM | POA: Insufficient documentation

## 2017-07-21 DIAGNOSIS — C189 Malignant neoplasm of colon, unspecified: Secondary | ICD-10-CM | POA: Insufficient documentation

## 2017-07-21 DIAGNOSIS — Z510 Encounter for antineoplastic radiation therapy: Secondary | ICD-10-CM | POA: Diagnosis not present

## 2017-07-21 DIAGNOSIS — R5383 Other fatigue: Secondary | ICD-10-CM | POA: Diagnosis not present

## 2017-07-21 DIAGNOSIS — M810 Age-related osteoporosis without current pathological fracture: Secondary | ICD-10-CM | POA: Diagnosis not present

## 2017-07-21 DIAGNOSIS — I10 Essential (primary) hypertension: Secondary | ICD-10-CM | POA: Diagnosis not present

## 2017-07-21 DIAGNOSIS — F17210 Nicotine dependence, cigarettes, uncomplicated: Secondary | ICD-10-CM | POA: Diagnosis not present

## 2017-07-21 DIAGNOSIS — Z79899 Other long term (current) drug therapy: Secondary | ICD-10-CM | POA: Diagnosis not present

## 2017-07-21 DIAGNOSIS — I700 Atherosclerosis of aorta: Secondary | ICD-10-CM | POA: Insufficient documentation

## 2017-07-21 LAB — BASIC METABOLIC PANEL
ANION GAP: 9 (ref 5–15)
BUN: 23 mg/dL — AB (ref 6–20)
CHLORIDE: 105 mmol/L (ref 101–111)
CO2: 26 mmol/L (ref 22–32)
Calcium: 9 mg/dL (ref 8.9–10.3)
Creatinine, Ser: 0.97 mg/dL (ref 0.44–1.00)
GFR calc Af Amer: >60 mL/min (ref >60)
GFR calc non Af Amer: 56 mL/min — ABNORMAL LOW (ref >60)
Glucose, Bld: 101 mg/dL — ABNORMAL HIGH (ref 65–99)
POTASSIUM: 3.8 mmol/L (ref 3.5–5.1)
Sodium: 140 mmol/L (ref 135–145)

## 2017-07-21 LAB — CBC WITH DIFFERENTIAL/PLATELET
BASOS ABS: 0.1 10*3/uL (ref 0–0.1)
Basophils Relative: 1 %
Eosinophils Absolute: 0 10*3/uL (ref 0–0.7)
Eosinophils Relative: 0 %
HEMATOCRIT: 45.3 % (ref 35.0–47.0)
Hemoglobin: 15.4 g/dL (ref 12.0–16.0)
LYMPHS ABS: 2.9 10*3/uL (ref 1.0–3.6)
LYMPHS PCT: 26 %
MCH: 30.5 pg (ref 26.0–34.0)
MCHC: 34 g/dL (ref 32.0–36.0)
MCV: 89.8 fL (ref 80.0–100.0)
MONO ABS: 0.6 10*3/uL (ref 0.2–0.9)
MONOS PCT: 6 %
NEUTROS ABS: 7.6 10*3/uL — AB (ref 1.4–6.5)
Neutrophils Relative %: 67 %
Platelets: 277 10*3/uL (ref 150–440)
RBC: 5.04 MIL/uL (ref 3.80–5.20)
RDW: 12.1 % (ref 11.5–14.5)
WBC: 11.2 10*3/uL — ABNORMAL HIGH (ref 3.6–11.0)

## 2017-07-21 MED ORDER — PALONOSETRON HCL INJECTION 0.25 MG/5ML
0.2500 mg | Freq: Once | INTRAVENOUS | Status: AC
  Administered 2017-07-21: 0.25 mg via INTRAVENOUS
  Filled 2017-07-21: qty 5

## 2017-07-21 MED ORDER — SODIUM CHLORIDE 0.9 % IV SOLN
40.0000 mg/m2 | Freq: Once | INTRAVENOUS | Status: AC
  Administered 2017-07-21: 64 mg via INTRAVENOUS
  Filled 2017-07-21: qty 64

## 2017-07-21 MED ORDER — SODIUM CHLORIDE 0.9 % IV SOLN
Freq: Once | INTRAVENOUS | Status: AC
  Administered 2017-07-21: 10:00:00 via INTRAVENOUS
  Filled 2017-07-21: qty 1000

## 2017-07-21 MED ORDER — SODIUM CHLORIDE 0.9 % IV SOLN
Freq: Once | INTRAVENOUS | Status: AC
  Administered 2017-07-21: 13:00:00 via INTRAVENOUS
  Filled 2017-07-21: qty 5

## 2017-07-21 MED ORDER — DEXTROSE-NACL 5-0.45 % IV SOLN
Freq: Once | INTRAVENOUS | Status: AC
  Administered 2017-07-21: 11:00:00 via INTRAVENOUS
  Filled 2017-07-21: qty 1000

## 2017-07-22 ENCOUNTER — Ambulatory Visit
Admission: RE | Admit: 2017-07-22 | Discharge: 2017-07-22 | Disposition: A | Discharge status: Home / Self Care | Payer: PPO | Source: Ambulatory Visit | Attending: Radiation Oncology | Admitting: Radiation Oncology

## 2017-07-22 DIAGNOSIS — Z510 Encounter for antineoplastic radiation therapy: Secondary | ICD-10-CM | POA: Diagnosis not present

## 2017-07-22 DIAGNOSIS — C760 Malignant neoplasm of head, face and neck: Secondary | ICD-10-CM | POA: Diagnosis not present

## 2017-07-22 DIAGNOSIS — F17210 Nicotine dependence, cigarettes, uncomplicated: Secondary | ICD-10-CM | POA: Diagnosis not present

## 2017-07-22 LAB — CEA: CEA1: 3.4 ng/mL (ref 0.0–4.7)

## 2017-07-23 ENCOUNTER — Ambulatory Visit
Admission: RE | Admit: 2017-07-23 | Discharge: 2017-07-23 | Disposition: A | Discharge status: Home / Self Care | Payer: PPO | Source: Ambulatory Visit | Attending: Radiation Oncology | Admitting: Radiation Oncology

## 2017-07-23 DIAGNOSIS — C760 Malignant neoplasm of head, face and neck: Secondary | ICD-10-CM | POA: Diagnosis not present

## 2017-07-23 DIAGNOSIS — F17210 Nicotine dependence, cigarettes, uncomplicated: Secondary | ICD-10-CM | POA: Diagnosis not present

## 2017-07-23 DIAGNOSIS — Z510 Encounter for antineoplastic radiation therapy: Secondary | ICD-10-CM | POA: Diagnosis not present

## 2017-07-24 ENCOUNTER — Ambulatory Visit
Admission: RE | Admit: 2017-07-24 | Discharge: 2017-07-24 | Disposition: A | Discharge status: Home / Self Care | Payer: PPO | Source: Ambulatory Visit | Attending: Radiation Oncology | Admitting: Radiation Oncology

## 2017-07-24 ENCOUNTER — Other Ambulatory Visit: Payer: Self-pay | Admitting: *Deleted

## 2017-07-24 DIAGNOSIS — F17210 Nicotine dependence, cigarettes, uncomplicated: Secondary | ICD-10-CM | POA: Diagnosis not present

## 2017-07-24 DIAGNOSIS — C760 Malignant neoplasm of head, face and neck: Secondary | ICD-10-CM | POA: Diagnosis not present

## 2017-07-24 DIAGNOSIS — Z510 Encounter for antineoplastic radiation therapy: Secondary | ICD-10-CM | POA: Diagnosis not present

## 2017-07-27 ENCOUNTER — Ambulatory Visit: Payer: PPO

## 2017-07-28 ENCOUNTER — Inpatient Hospital Stay: Payer: PPO

## 2017-07-28 ENCOUNTER — Inpatient Hospital Stay (HOSPITAL_BASED_OUTPATIENT_CLINIC_OR_DEPARTMENT_OTHER): Payer: PPO | Admitting: Oncology

## 2017-07-28 ENCOUNTER — Ambulatory Visit
Admission: RE | Admit: 2017-07-28 | Discharge: 2017-07-28 | Disposition: A | Discharge status: Home / Self Care | Payer: PPO | Source: Ambulatory Visit | Attending: Radiation Oncology | Admitting: Radiation Oncology

## 2017-07-28 VITALS — BP 116/80 | HR 84 | Temp 97.6°F | Resp 18 | Wt 115.7 lb

## 2017-07-28 DIAGNOSIS — I700 Atherosclerosis of aorta: Secondary | ICD-10-CM

## 2017-07-28 DIAGNOSIS — Z79899 Other long term (current) drug therapy: Secondary | ICD-10-CM | POA: Diagnosis not present

## 2017-07-28 DIAGNOSIS — C760 Malignant neoplasm of head, face and neck: Secondary | ICD-10-CM | POA: Diagnosis not present

## 2017-07-28 DIAGNOSIS — I10 Essential (primary) hypertension: Secondary | ICD-10-CM | POA: Diagnosis not present

## 2017-07-28 DIAGNOSIS — R112 Nausea with vomiting, unspecified: Secondary | ICD-10-CM | POA: Diagnosis not present

## 2017-07-28 DIAGNOSIS — E7800 Pure hypercholesterolemia, unspecified: Secondary | ICD-10-CM

## 2017-07-28 DIAGNOSIS — F17210 Nicotine dependence, cigarettes, uncomplicated: Secondary | ICD-10-CM | POA: Diagnosis not present

## 2017-07-28 DIAGNOSIS — R1310 Dysphagia, unspecified: Secondary | ICD-10-CM

## 2017-07-28 DIAGNOSIS — Z510 Encounter for antineoplastic radiation therapy: Secondary | ICD-10-CM | POA: Diagnosis not present

## 2017-07-28 DIAGNOSIS — R634 Abnormal weight loss: Secondary | ICD-10-CM | POA: Diagnosis not present

## 2017-07-28 DIAGNOSIS — C4442 Squamous cell carcinoma of skin of scalp and neck: Secondary | ICD-10-CM

## 2017-07-28 DIAGNOSIS — Z5111 Encounter for antineoplastic chemotherapy: Secondary | ICD-10-CM | POA: Diagnosis not present

## 2017-07-28 DIAGNOSIS — C189 Malignant neoplasm of colon, unspecified: Secondary | ICD-10-CM

## 2017-07-28 LAB — CBC WITH DIFFERENTIAL/PLATELET
Basophils Absolute: 0 10*3/uL (ref 0–0.1)
Basophils Relative: 0 %
EOS ABS: 0 10*3/uL (ref 0–0.7)
EOS PCT: 0 %
HCT: 43.3 % (ref 35.0–47.0)
Hemoglobin: 14.5 g/dL (ref 12.0–16.0)
LYMPHS ABS: 1.7 10*3/uL (ref 1.0–3.6)
Lymphocytes Relative: 17 %
MCH: 30.2 pg (ref 26.0–34.0)
MCHC: 33.5 g/dL (ref 32.0–36.0)
MCV: 90.2 fL (ref 80.0–100.0)
MONO ABS: 0.6 10*3/uL (ref 0.2–0.9)
Monocytes Relative: 6 %
NEUTROS ABS: 7.8 10*3/uL — AB (ref 1.4–6.5)
Neutrophils Relative %: 77 %
PLATELETS: 263 10*3/uL (ref 150–440)
RBC: 4.8 MIL/uL (ref 3.80–5.20)
RDW: 12.2 % (ref 11.5–14.5)
WBC: 10.1 10*3/uL (ref 3.6–11.0)

## 2017-07-28 LAB — BASIC METABOLIC PANEL
ANION GAP: 11 (ref 5–15)
BUN: 23 mg/dL — AB (ref 6–20)
CHLORIDE: 104 mmol/L (ref 101–111)
CO2: 25 mmol/L (ref 22–32)
Calcium: 8.8 mg/dL — ABNORMAL LOW (ref 8.9–10.3)
Creatinine, Ser: 1.12 mg/dL — ABNORMAL HIGH (ref 0.44–1.00)
GFR calc Af Amer: 55 mL/min — ABNORMAL LOW (ref >60)
GFR, EST NON AFRICAN AMERICAN: 47 mL/min — AB (ref >60)
GLUCOSE: 111 mg/dL — AB (ref 65–99)
POTASSIUM: 3.4 mmol/L — AB (ref 3.5–5.1)
Sodium: 140 mmol/L (ref 135–145)

## 2017-07-28 MED ORDER — POTASSIUM CHLORIDE 2 MEQ/ML IV SOLN
Freq: Once | INTRAVENOUS | Status: AC
  Administered 2017-07-28: 13:00:00 via INTRAVENOUS
  Filled 2017-07-28: qty 1000

## 2017-07-28 MED ORDER — SODIUM CHLORIDE 0.9 % IV SOLN
40.0000 mg/m2 | Freq: Once | INTRAVENOUS | Status: AC
  Administered 2017-07-28: 64 mg via INTRAVENOUS
  Filled 2017-07-28: qty 64

## 2017-07-28 MED ORDER — SODIUM CHLORIDE 0.9 % IV SOLN
Freq: Once | INTRAVENOUS | Status: AC
  Administered 2017-07-28: 15:00:00 via INTRAVENOUS
  Filled 2017-07-28: qty 5

## 2017-07-28 MED ORDER — SODIUM CHLORIDE 0.9 % IV SOLN
Freq: Once | INTRAVENOUS | Status: AC
  Administered 2017-07-28: 12:00:00 via INTRAVENOUS
  Filled 2017-07-28: qty 1000

## 2017-07-28 MED ORDER — PALONOSETRON HCL INJECTION 0.25 MG/5ML
0.2500 mg | Freq: Once | INTRAVENOUS | Status: AC
  Administered 2017-07-28: 0.25 mg via INTRAVENOUS
  Filled 2017-07-28: qty 5

## 2017-07-28 NOTE — Progress Notes (Signed)
[place]  Telephone:(336) [phone] Fax:(336) [phone]  ID: Dana Perez OB: March 14, 1941  MR#: [id number]  PXT#:[id number]  Patient Care Team: Glendon Axe, MD as PCP - General (Internal Medicine)  CHIEF COMPLAINT: Stage IVa head and neck squamous cell carcinoma, adenocarcinoma of the colon.  INTERVAL HISTORY: Patient returns to clinic today to initiate cycle 3 of weekly cisplatin.  She is having increased nausea, with a poor appetite and weight loss.  She has some mild dysphagia but otherwise tolerating her XRT. She has no neurologic complaints. She does not complain of weakness or fatigue. She has no chest pain or shortness of breath. She denies any constipation or diarrhea.  She has no melena or hematochezia.  She has no urinary complaints. Patient offers no further specific complaints today.  REVIEW OF SYSTEMS:   Review of Systems  Constitutional: Positive for weight loss. Negative for fever and malaise/fatigue.  HENT: Negative.  Negative for sore throat.   Respiratory: Negative.  Negative for cough and shortness of breath.   Cardiovascular: Negative.  Negative for chest pain and leg swelling.  Gastrointestinal: Positive for nausea and vomiting. Negative for abdominal pain.  Genitourinary: Negative.   Musculoskeletal: Negative.   Skin: Negative.  Negative for rash.  Neurological: Negative.  Negative for weakness.  Psychiatric/Behavioral: Negative.  The patient is not nervous/anxious.     As per HPI. Otherwise, a complete review of systems is negative.  PAST MEDICAL HISTORY: Past Medical History:  Diagnosis Date  . Adenocarcinoma of colon ([place])   . Benign neoplasm of ascending colon   . Essential (primary) hypertension 06/13/2014  . Essential hypertension 06/13/2014  . Goals of care, counseling/discussion 07/14/2017  . HLD (hyperlipidemia) 06/13/2014  . Hypercholesteremia   . Hyperlipidemia   . Hyperlipidemia, unspecified 06/13/2014  .  Hypertension   . Multiple thyroid nodules 01/22/2012  . Osteoporosis   . Polycythemia, secondary 12/26/2014  . Pure hypercholesterolemia 03/15/2015  . Thyroid nodule     PAST SURGICAL HISTORY: Past Surgical History:  Procedure Laterality Date  . ABDOMINAL HYSTERECTOMY    . COLONOSCOPY WITH PROPOFOL N/A 07/02/2017   Procedure: COLONOSCOPY WITH PROPOFOL;  Surgeon: Lucilla Lame, MD;  Location: [facility];  Service: Endoscopy;  Laterality: N/A;  . POLYPECTOMY N/A 07/02/2017   Procedure: POLYPECTOMY;  Surgeon: Lucilla Lame, MD;  Location: [facility];  Service: Endoscopy;  Laterality: N/A;  . WRIST FRACTURE SURGERY  08/2009   badly broken    FAMILY HISTORY Family History  Problem Relation Age of Onset  . Heart disease Mother   . Diabetes Father        ADVANCED DIRECTIVES:    HEALTH MAINTENANCE: Social History   Tobacco Use  . Smoking status: Current Every Day Smoker    Packs/day: 0.25    Types: Cigarettes  . Smokeless tobacco: Never Used  . Tobacco comment: occasional smoker  Substance Use Topics  . Alcohol use: No  . Drug use: No     Allergies  Allergen Reactions  . Other Other (See Comments) and Rash    TDAP    Current Outpatient Medications  Medication Sig Dispense Refill  . amLODipine (NORVASC) 2.5 MG tablet Take 2.5 mg by mouth daily.    . Calcium Carbonate-Vitamin D (CALCIUM-VITAMIN D) 500-200 MG-UNIT per tablet Take 2 tablets by mouth daily.    . pravastatin (PRAVACHOL) 10 MG tablet Take 10 mg by mouth daily.    . ondansetron (ZOFRAN) 8 MG tablet Take 1 tablet (8 mg total)  2 (two) times daily as needed by mouth. (Patient not taking: Reported on 07/14/2017) 30 tablet 2  . prochlorperazine (COMPAZINE) 10 MG tablet Take 1 tablet (10 mg total) every 6 (six) hours as needed by mouth (Nausea or vomiting). (Patient not taking: Reported on 07/14/2017) 60 tablet 2   No current facility-administered medications for this visit.    Facility-Administered  Medications Ordered in Other Visits  Medication Dose Route Frequency Provider Last Rate Last Dose  . heparin lock flush 100 unit/mL  250 Units Intracatheter PRN Leia Alf, MD      . heparin lock flush 100 unit/mL  500 Units Intracatheter PRN Leia Alf, MD      . sodium chloride 0.9 % injection 10 mL  10 mL Intracatheter PRN Leia Alf, MD      . sodium chloride 0.9 % injection 10 mL  10 mL Intracatheter PRN Leia Alf, MD      . sodium chloride 0.9 % injection 10 mL  10 mL Intracatheter PRN Ma Hillock, Sandeep, MD      . sodium chloride 0.9 % injection 10 mL  10 mL Intracatheter PRN Leia Alf, MD        OBJECTIVE: Vitals:   07/28/17 1134  BP: 116/80  Pulse: 84  Resp: 18  Temp: 97.6 F (36.4 C)     Body mass index is 19.86 kg/m.    ECOG FS:0 - Asymptomatic  General: Well-developed, well-nourished, no acute distress. Eyes: Pink conjunctiva, anicteric sclera. HEENT: Easily palpable lymph node in right cervical chain, slightly decreased in size.   Lungs: Clear to auscultation bilaterally. Heart: Regular rate and rhythm. No rubs, murmurs, or gallops. Abdomen: Soft, nontender, nondistended. No organomegaly noted, normoactive bowel sounds. Musculoskeletal: No edema, cyanosis, or clubbing. Neuro: Alert, answering all questions appropriately. Cranial nerves grossly intact. Skin: No rashes or petechiae noted. Psych: Normal affect.   LAB RESULTS:  Lab Results  Component Value Date   NA 140 07/21/2017   K 3.8 07/21/2017   CL 105 07/21/2017   CO2 26 07/21/2017   GLUCOSE 101 (H) 07/21/2017   BUN 23 (H) 07/21/2017   CREATININE 0.97 07/21/2017   CALCIUM 9.0 07/21/2017   GFRNONAA 56 (L) 07/21/2017   GFRAA >60 07/21/2017    Lab Results  Component Value Date   WBC 10.1 07/28/2017   NEUTROABS 7.8 (H) 07/28/2017   HGB 14.5 07/28/2017   HCT 43.3 07/28/2017   MCV 90.2 07/28/2017   PLT 263 07/28/2017     STUDIES: No results found.  ASSESSMENT: Stage IVa  head and neck squamous cell carcinoma, adenocarcinoma of the colon  PLAN:    1. 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  Patient was also discussed with ENT as well as at cancer conference.  She will benefit from concurrent chemotherapy using weekly cisplatin along with daily XRT.  To need daily XRT.  Proceed with cycle 3 of weekly cisplatin.  Plan to do 5-8 cycles and then reimage 3 months after the completion of treatment with PET scan.  Return to clinic in 1 week for further evaluation and consideration of cycle 4. 2.  Adenocarcinoma of the colon: Patient has requested to delay surgery or treatment until after her head and neck treatments are complete.  She reports her surgical appointment is on July 29, 2017.  CEA is within normal limits. 3.  Nausea and vomiting: Continue Compazine and Zofran as prescribed. 4.  Weight loss: Patient was given a  referral to dietary.   Patient was seen and evaluated independently and I agree with the assessment and plan as dictated above.  Please refer patient back if her hemoglobin increases.  [name], MD 07/28/17 11:42 AM

## 2017-07-29 ENCOUNTER — Ambulatory Visit
Admission: RE | Admit: 2017-07-29 | Discharge: 2017-07-29 | Disposition: A | Discharge status: Home / Self Care | Payer: PPO | Source: Ambulatory Visit | Attending: Radiation Oncology | Admitting: Radiation Oncology

## 2017-07-29 ENCOUNTER — Encounter: Payer: Self-pay | Admitting: Surgery

## 2017-07-29 ENCOUNTER — Other Ambulatory Visit
Admission: RE | Admit: 2017-07-29 | Discharge: 2017-07-29 | Disposition: A | Discharge status: Home / Self Care | Payer: PPO | Source: Ambulatory Visit | Attending: Surgery | Admitting: Surgery

## 2017-07-29 ENCOUNTER — Ambulatory Visit (INDEPENDENT_AMBULATORY_CARE_PROVIDER_SITE_OTHER): Payer: PPO | Admitting: Surgery

## 2017-07-29 ENCOUNTER — Telehealth: Payer: Self-pay

## 2017-07-29 VITALS — BP 135/73 | HR 82 | Temp 97.5°F | Ht 64.0 in | Wt 120.4 lb

## 2017-07-29 DIAGNOSIS — C182 Malignant neoplasm of ascending colon: Secondary | ICD-10-CM

## 2017-07-29 DIAGNOSIS — F17210 Nicotine dependence, cigarettes, uncomplicated: Secondary | ICD-10-CM | POA: Diagnosis not present

## 2017-07-29 DIAGNOSIS — C760 Malignant neoplasm of head, face and neck: Secondary | ICD-10-CM | POA: Diagnosis not present

## 2017-07-29 DIAGNOSIS — R1084 Generalized abdominal pain: Secondary | ICD-10-CM

## 2017-07-29 DIAGNOSIS — Z510 Encounter for antineoplastic radiation therapy: Secondary | ICD-10-CM | POA: Diagnosis not present

## 2017-07-29 LAB — CBC WITH DIFFERENTIAL/PLATELET
BASOS PCT: 0 %
Basophils Absolute: 0 10*3/uL (ref 0–0.1)
Eosinophils Absolute: 0 10*3/uL (ref 0–0.7)
Eosinophils Relative: 0 %
HEMATOCRIT: 41.5 % (ref 35.0–47.0)
Hemoglobin: 13.8 g/dL (ref 12.0–16.0)
LYMPHS PCT: 7 %
Lymphs Abs: 0.9 10*3/uL — ABNORMAL LOW (ref 1.0–3.6)
MCH: 29.7 pg (ref 26.0–34.0)
MCHC: 33.2 g/dL (ref 32.0–36.0)
MCV: 89.3 fL (ref 80.0–100.0)
MONO ABS: 0.7 10*3/uL (ref 0.2–0.9)
MONOS PCT: 5 %
NEUTROS ABS: 11.4 10*3/uL — AB (ref 1.4–6.5)
Neutrophils Relative %: 88 %
Platelets: 280 10*3/uL (ref 150–440)
RBC: 4.64 MIL/uL (ref 3.80–5.20)
RDW: 11.9 % (ref 11.5–14.5)
WBC: 13 10*3/uL — ABNORMAL HIGH (ref 3.6–11.0)

## 2017-07-29 LAB — COMPREHENSIVE METABOLIC PANEL
ALBUMIN: 4.2 g/dL (ref 3.5–5.0)
ALK PHOS: 89 U/L (ref 38–126)
ALT: 19 U/L (ref 14–54)
ANION GAP: 10 (ref 5–15)
AST: 25 U/L (ref 15–41)
BILIRUBIN TOTAL: 0.6 mg/dL (ref 0.3–1.2)
BUN: 25 mg/dL — ABNORMAL HIGH (ref 6–20)
CALCIUM: 8.9 mg/dL (ref 8.9–10.3)
CO2: 22 mmol/L (ref 22–32)
Chloride: 107 mmol/L (ref 101–111)
Creatinine, Ser: 0.97 mg/dL (ref 0.44–1.00)
GFR calc Af Amer: >60 mL/min (ref >60)
GFR, EST NON AFRICAN AMERICAN: 56 mL/min — AB (ref >60)
GLUCOSE: 116 mg/dL — AB (ref 65–99)
Potassium: 3.7 mmol/L (ref 3.5–5.1)
Sodium: 139 mmol/L (ref 135–145)
Total Protein: 7.4 g/dL (ref 6.5–8.1)

## 2017-07-29 LAB — PROTIME-INR
INR: 0.87
PROTHROMBIN TIME: 11.7 s (ref 11.4–15.2)

## 2017-07-29 LAB — APTT: aPTT: <24 seconds — ABNORMAL LOW (ref 24–36)

## 2017-07-29 NOTE — Patient Instructions (Signed)
We would like for you to stop by the lab today. We will call you with the results.  Please see your follow up appointment listed below.

## 2017-07-29 NOTE — Telephone Encounter (Signed)
Patient notified of labs ok per Dr.Pabon.  Patient reminded of follow up appointment as well 09/03/17 with Dr.Pabon.

## 2017-07-30 ENCOUNTER — Ambulatory Visit
Admission: RE | Admit: 2017-07-30 | Discharge: 2017-07-30 | Disposition: A | Discharge status: Home / Self Care | Payer: PPO | Source: Ambulatory Visit | Attending: Radiation Oncology | Admitting: Radiation Oncology

## 2017-07-30 DIAGNOSIS — C760 Malignant neoplasm of head, face and neck: Secondary | ICD-10-CM | POA: Diagnosis not present

## 2017-07-30 DIAGNOSIS — F17210 Nicotine dependence, cigarettes, uncomplicated: Secondary | ICD-10-CM | POA: Diagnosis not present

## 2017-07-30 DIAGNOSIS — Z510 Encounter for antineoplastic radiation therapy: Secondary | ICD-10-CM | POA: Diagnosis not present

## 2017-07-31 ENCOUNTER — Ambulatory Visit
Admission: RE | Admit: 2017-07-31 | Discharge: 2017-07-31 | Disposition: A | Discharge status: Home / Self Care | Payer: PPO | Source: Ambulatory Visit | Attending: Radiation Oncology | Admitting: Radiation Oncology

## 2017-07-31 ENCOUNTER — Encounter: Payer: Self-pay | Admitting: Oncology

## 2017-07-31 ENCOUNTER — Inpatient Hospital Stay: Payer: PPO

## 2017-07-31 ENCOUNTER — Other Ambulatory Visit: Payer: Self-pay | Admitting: *Deleted

## 2017-07-31 ENCOUNTER — Inpatient Hospital Stay (HOSPITAL_BASED_OUTPATIENT_CLINIC_OR_DEPARTMENT_OTHER): Payer: PPO | Admitting: Oncology

## 2017-07-31 VITALS — BP 147/75 | HR 71 | Temp 96.6°F | Resp 20 | Wt 116.0 lb

## 2017-07-31 DIAGNOSIS — E860 Dehydration: Secondary | ICD-10-CM | POA: Diagnosis not present

## 2017-07-31 DIAGNOSIS — R531 Weakness: Secondary | ICD-10-CM

## 2017-07-31 DIAGNOSIS — C760 Malignant neoplasm of head, face and neck: Secondary | ICD-10-CM | POA: Diagnosis not present

## 2017-07-31 DIAGNOSIS — I10 Essential (primary) hypertension: Secondary | ICD-10-CM

## 2017-07-31 DIAGNOSIS — Z79899 Other long term (current) drug therapy: Secondary | ICD-10-CM

## 2017-07-31 DIAGNOSIS — R197 Diarrhea, unspecified: Secondary | ICD-10-CM

## 2017-07-31 DIAGNOSIS — E7800 Pure hypercholesterolemia, unspecified: Secondary | ICD-10-CM

## 2017-07-31 DIAGNOSIS — F17210 Nicotine dependence, cigarettes, uncomplicated: Secondary | ICD-10-CM | POA: Diagnosis not present

## 2017-07-31 DIAGNOSIS — R634 Abnormal weight loss: Secondary | ICD-10-CM

## 2017-07-31 DIAGNOSIS — Z5111 Encounter for antineoplastic chemotherapy: Secondary | ICD-10-CM | POA: Diagnosis not present

## 2017-07-31 DIAGNOSIS — R5381 Other malaise: Secondary | ICD-10-CM

## 2017-07-31 DIAGNOSIS — R112 Nausea with vomiting, unspecified: Secondary | ICD-10-CM | POA: Diagnosis not present

## 2017-07-31 DIAGNOSIS — I700 Atherosclerosis of aorta: Secondary | ICD-10-CM

## 2017-07-31 DIAGNOSIS — Z510 Encounter for antineoplastic radiation therapy: Secondary | ICD-10-CM | POA: Diagnosis not present

## 2017-07-31 DIAGNOSIS — R5383 Other fatigue: Secondary | ICD-10-CM

## 2017-07-31 DIAGNOSIS — C189 Malignant neoplasm of colon, unspecified: Secondary | ICD-10-CM | POA: Diagnosis not present

## 2017-07-31 DIAGNOSIS — R1310 Dysphagia, unspecified: Secondary | ICD-10-CM

## 2017-07-31 LAB — BASIC METABOLIC PANEL
Anion gap: 9 (ref 5–15)
BUN: 22 mg/dL — AB (ref 6–20)
CHLORIDE: 103 mmol/L (ref 101–111)
CO2: 25 mmol/L (ref 22–32)
CREATININE: 0.99 mg/dL (ref 0.44–1.00)
Calcium: 8.7 mg/dL — ABNORMAL LOW (ref 8.9–10.3)
GFR calc Af Amer: >60 mL/min (ref >60)
GFR calc non Af Amer: 55 mL/min — ABNORMAL LOW (ref >60)
Glucose, Bld: 108 mg/dL — ABNORMAL HIGH (ref 65–99)
Potassium: 3.7 mmol/L (ref 3.5–5.1)
SODIUM: 137 mmol/L (ref 135–145)

## 2017-07-31 LAB — CBC WITH DIFFERENTIAL/PLATELET
BASOS ABS: 0 10*3/uL (ref 0–0.1)
BASOS PCT: 0 %
Eosinophils Absolute: 0 10*3/uL (ref 0–0.7)
Eosinophils Relative: 0 %
HEMATOCRIT: 41.2 % (ref 35.0–47.0)
HEMOGLOBIN: 13.9 g/dL (ref 12.0–16.0)
LYMPHS ABS: 1.2 10*3/uL (ref 1.0–3.6)
Lymphocytes Relative: 15 %
MCH: 30.3 pg (ref 26.0–34.0)
MCHC: 33.9 g/dL (ref 32.0–36.0)
MCV: 89.3 fL (ref 80.0–100.0)
MONOS PCT: 5 %
Monocytes Absolute: 0.4 10*3/uL (ref 0.2–0.9)
Neutro Abs: 6.5 10*3/uL (ref 1.4–6.5)
Neutrophils Relative %: 80 %
Platelets: 242 10*3/uL (ref 150–440)
RBC: 4.61 MIL/uL (ref 3.80–5.20)
RDW: 12.2 % (ref 11.5–14.5)
WBC: 8.2 10*3/uL (ref 3.6–11.0)

## 2017-07-31 MED ORDER — DEXAMETHASONE 4 MG PO TABS: 4.0000 mg | ORAL_TABLET | Freq: Every day | ORAL | 0 refills | Status: DC

## 2017-07-31 MED ORDER — SODIUM CHLORIDE 0.9 % IV SOLN
INTRAVENOUS | Status: DC
  Administered 2017-07-31: 10:00:00 via INTRAVENOUS
  Filled 2017-07-31 (×2): qty 1000

## 2017-07-31 MED ORDER — SUCRALFATE 1 G PO TABS: 1.0000 g | ORAL_TABLET | Freq: Three times a day (TID) | ORAL | 3 refills | Status: DC

## 2017-07-31 MED ORDER — SODIUM CHLORIDE 0.9 % IV SOLN
Freq: Once | INTRAVENOUS | Status: AC
  Administered 2017-07-31: 11:00:00 via INTRAVENOUS
  Filled 2017-07-31: qty 4

## 2017-07-31 NOTE — Progress Notes (Signed)
Patient ID: Dana Perez, female   DOB: 01/21/1941, 76 y.o.   MRN: [id number]  HPI Dana Perez is a 76 y.o. female seen in consultation at the request of Dr. Grayland Ormond for a recently diagnosed adenocarcinoma within a 20 mm polyp on the ascending colon. She has been getting chemo and radiation rx for met SCC neck. Recent PET showed neck mass as well as 2cm hypermetabolic lesion involving the right colon.No liver mets.  Normal CEA. Last white count was 13,000 with a normal hemoglobin and normal platelet count. INR was normal as well as creatinine. Albumin is 4.2 She feels week from her chemo and nauseated. Taking PO and no dysphagia. Colonoscopy personally reviewed polyp was lifting and clips placed. Path reviewed as well. Findings d/w pt in detail  HPI  Past Medical History:  Diagnosis Date  . Adenocarcinoma of colon ([place])   . Benign neoplasm of ascending colon   . Essential (primary) hypertension 06/13/2014  . Essential hypertension 06/13/2014  . Goals of care, counseling/discussion 07/14/2017  . HLD (hyperlipidemia) 06/13/2014  . Hypercholesteremia   . Hyperlipidemia   . Hyperlipidemia, unspecified 06/13/2014  . Hypertension   . Multiple thyroid nodules 01/22/2012  . Osteoporosis   . Polycythemia, secondary 12/26/2014  . Pure hypercholesterolemia 03/15/2015  . Thyroid nodule     Past Surgical History:  Procedure Laterality Date  . ABDOMINAL HYSTERECTOMY    . COLONOSCOPY WITH PROPOFOL N/A 07/02/2017   Procedure: COLONOSCOPY WITH PROPOFOL;  Surgeon: Lucilla Lame, MD;  Location: [facility];  Service: Endoscopy;  Laterality: N/A;  . POLYPECTOMY N/A 07/02/2017   Procedure: POLYPECTOMY;  Surgeon: Lucilla Lame, MD;  Location: [facility];  Service: Endoscopy;  Laterality: N/A;  . WRIST FRACTURE SURGERY  08/2009   badly broken    Family History  Problem Relation Age of Onset  . Heart disease Mother   . Diabetes Father     Social History Social History    Tobacco Use  . Smoking status: Current Every Day Smoker    Packs/day: 0.25    Types: Cigarettes  . Smokeless tobacco: Never Used  . Tobacco comment: occasional smoker  Substance Use Topics  . Alcohol use: No  . Drug use: No    Allergies  Allergen Reactions  . Other Other (See Comments) and Rash    TDAP    Current Outpatient Medications  Medication Sig Dispense Refill  . amLODipine (NORVASC) 2.5 MG tablet Take 2.5 mg by mouth daily.    . Calcium Carbonate-Vitamin D (CALCIUM-VITAMIN D) 500-200 MG-UNIT per tablet Take 2 tablets by mouth daily.    . ondansetron (ZOFRAN) 8 MG tablet Take 1 tablet (8 mg total) 2 (two) times daily as needed by mouth. 30 tablet 2  . pravastatin (PRAVACHOL) 10 MG tablet Take 10 mg by mouth daily.    . prochlorperazine (COMPAZINE) 10 MG tablet Take 1 tablet (10 mg total) every 6 (six) hours as needed by mouth (Nausea or vomiting). 60 tablet 2  . dexamethasone (DECADRON) 4 MG tablet Take 1 tablet (4 mg total) by mouth daily. 30 tablet 0  . sucralfate (CARAFATE) 1 g tablet Take 1 tablet (1 g total) by mouth 3 (three) times daily. Dissolve in 2-3 tbsp warm water, swish and swallow. 90 tablet 3   No current facility-administered medications for this visit.    Facility-Administered Medications Ordered in Other Visits  Medication Dose Route Frequency Provider Last Rate Last Dose  . 0.9 %  sodium chloride infusion  Intravenous Continuous Jacquelin Hawking, NP   Stopped at 07/31/17 1138  . heparin lock flush 100 unit/mL  250 Units Intracatheter PRN Leia Alf, MD      . heparin lock flush 100 unit/mL  500 Units Intracatheter PRN Leia Alf, MD      . sodium chloride 0.9 % injection 10 mL  10 mL Intracatheter PRN Leia Alf, MD      . sodium chloride 0.9 % injection 10 mL  10 mL Intracatheter PRN Leia Alf, MD      . sodium chloride 0.9 % injection 10 mL  10 mL Intracatheter PRN Leia Alf, MD      . sodium chloride 0.9 % injection 10  mL  10 mL Intracatheter PRN Leia Alf, MD         Review of Systems Full ROS  was asked and was negative except for the information on the HPI  Physical Exam Blood pressure 135/73, pulse 82, temperature (!) 97.5 F (36.4 C), temperature source Oral, height 5' 4"  (1.626 m), weight 54.6 kg (120 lb 6.4 oz). CONSTITUTIONAL: Debilitated female in NAD EYES: Pupils are equal, round, and reactive to light, Sclera are non-icteric. EARS, NOSE, MOUTH AND THROAT: The oropharynx is clear. The oral mucosa is pink and moist. Hearing is intact to voice. LYMPH NODES:  Right hard submandibular mass , fixed and non tender RESPIRATORY:  Lungs are clear. There is normal respiratory effort, with equal breath sounds bilaterally, and without pathologic use of accessory muscles. CARDIOVASCULAR: Heart is regular without murmurs, gallops, or rubs. GI: The abdomen is  soft, nontender, and nondistended. There are no palpable masses. There is no hepatosplenomegaly. There are normal bowel sounds in all quadrants. GU: Rectal deferred.   MUSCULOSKELETAL: Normal muscle strength and tone. No cyanosis or edema.   SKIN: Turgor is good and there are no pathologic skin lesions or ulcers. NEUROLOGIC: Motor and sensation is grossly normal. Cranial nerves are grossly intact. PSYCH:  Oriented to person, place and time. Affect is normal.  Data Reviewed I have personally reviewed the patient's imaging, laboratory findings and medical records.    Assessment/Plan  76 year old female with stage IV squamous cell or carcinoma of the neck now with chemotherapy and radiation therapy and newly diagnosed right adenocarcinoma. Patient currently wishes to wait at least for 3-4 weeks before a proposed colectomy. She was to recover from the chemoradiation. In the meantime we will obtain some repeat labs including repeat CBC, CMP and INR. Situation discussed with the patient in detail about my recomendation for a right hemicolectomy given  her pathology.She understands but wishes to wait until next month. I will see her back next month and hopefully we can schedule her colectomy at that time.  A copy of the report will be sent to the referring provider      Caroleen Hamman, MD FACS General Surgeon 07/31/2017, 7:28 PM

## 2017-07-31 NOTE — Progress Notes (Signed)
Patient here as an acute add on for nausea, vomiting and dehydration. She states that she has been sick since starting her chemo treatments. She is also very weak.

## 2017-07-31 NOTE — Progress Notes (Signed)
Symptom Management Consult note [facility]  Telephone:([phone] Fax:([phone]  Patient Care Team: Glendon Axe, MD as PCP - General (Internal Medicine)   Name of the patient: Dana Perez  [id number]  1940/10/19   Date of visit: 07/31/17  Diagnosis- Stage IVa head and neck squamous cell carcinoma, adenocarcinoma of the colon.  Chief complaint/ Reason for visit- Dehydration  Heme/Onc history: Stage IVa head and neck squamous cell carcinoma: Pathology and PET scan results reviewed independently.  Patient was also discussed with ENT as well as at cancer conference.  She will benefit from concurrent chemotherapy using weekly cisplatin along with daily XRT.  To need daily XRT.  Proceed with cycle 3 of weekly cisplatin.  Plan to do 5-8 cycles and then reimage 3 months after the completion of treatment with PET scan.  Return to clinic in 1 week for further evaluation and consideration of cycle 4.  Interval history- Patient was last seen by Dr. Grayland Ormond on 07/28/17 to initiate cycle 3 of weekly cisplatin. She continued to have nausea with a poor appetite and weight loss. She complained of mild dysphagia but was otherwise tolerating her radiation. She denied any neurological complaints or weakness or fatigue.  She was scheduled to see Dr. Grayland Ormond for weekly cisplatin on 08/04/2017 and and continue daily radiation but today she was advised to be seen in symptom management because she appeared very weak while receiving radiation.  Patient presents today for significant weakness, nausea and vomiting. Her husband states he tries to get her to eat foods and drink fluids but she is extremely nauseated. She "dry heaves" all day long. She is taking medications as prescribed for nausea but they do not seem to help for long. She denies any fevers or abdominal pain. She has occasional diarrhea but denies blood in stool. Husband has noticed that she appears he'll and she  is unable to move around due to weakness and fatigue.  ECOG FS:1 - Symptomatic but completely ambulatory  Review of systems- Review of Systems  Constitutional: Positive for malaise/fatigue and weight loss. Negative for chills and fever.  HENT: Negative.   Eyes: Negative.   Respiratory: Negative.  Negative for sputum production and shortness of breath.   Cardiovascular: Negative.   Gastrointestinal: Positive for diarrhea, nausea and vomiting. Negative for abdominal pain.  Genitourinary: Negative.   Musculoskeletal: Negative.   Skin: Negative.   Neurological: Positive for weakness.  Endo/Heme/Allergies: Negative.   Psychiatric/Behavioral: Negative.      Current treatment- S/P Cycle 3 Weekly Cisplatin  Allergies  Allergen Reactions  . Other Other (See Comments) and Rash    TDAP     Past Medical History:  Diagnosis Date  . Adenocarcinoma of colon ([place])   . Benign neoplasm of ascending colon   . Essential (primary) hypertension 06/13/2014  . Essential hypertension 06/13/2014  . Goals of care, counseling/discussion 07/14/2017  . HLD (hyperlipidemia) 06/13/2014  . Hypercholesteremia   . Hyperlipidemia   . Hyperlipidemia, unspecified 06/13/2014  . Hypertension   . Multiple thyroid nodules 01/22/2012  . Osteoporosis   . Polycythemia, secondary 12/26/2014  . Pure hypercholesterolemia 03/15/2015  . Thyroid nodule      Past Surgical History:  Procedure Laterality Date  . ABDOMINAL HYSTERECTOMY    . COLONOSCOPY WITH PROPOFOL N/A 07/02/2017   Procedure: COLONOSCOPY WITH PROPOFOL;  Surgeon: Lucilla Lame, MD;  Location: [facility];  Service: Endoscopy;  Laterality: N/A;  . POLYPECTOMY N/A 07/02/2017   Procedure: POLYPECTOMY;  Surgeon: Lucilla Lame, MD;  Location: [place];  Service: Endoscopy;  Laterality: N/A;  . WRIST FRACTURE SURGERY  08/2009   badly broken    Social History   Socioeconomic History  . Marital status: Married    Spouse name: Not on file   . Number of children: Not on file  . Years of education: Not on file  . Highest education level: Not on file  Social Needs  . Financial resource strain: Not on file  . Food insecurity - worry: Not on file  . Food insecurity - inability: Not on file  . Transportation needs - medical: Not on file  . Transportation needs - non-medical: Not on file  Occupational History  . Not on file  Tobacco Use  . Smoking status: Current Every Day Smoker    Packs/day: 0.25    Types: Cigarettes  . Smokeless tobacco: Never Used  . Tobacco comment: occasional smoker  Substance and Sexual Activity  . Alcohol use: No  . Drug use: No  . Sexual activity: Not on file  Other Topics Concern  . Not on file  Social History Narrative  . Not on file    Family History  Problem Relation Age of Onset  . Heart disease Mother   . Diabetes Father      Current Outpatient Medications:  .  amLODipine (NORVASC) 2.5 MG tablet, Take 2.5 mg by mouth daily., Disp: , Rfl:  .  Calcium Carbonate-Vitamin D (CALCIUM-VITAMIN D) 500-200 MG-UNIT per tablet, Take 2 tablets by mouth daily., Disp: , Rfl:  .  dexamethasone (DECADRON) 4 MG tablet, Take 1 tablet (4 mg total) by mouth daily., Disp: 30 tablet, Rfl: 0 .  ondansetron (ZOFRAN) 8 MG tablet, Take 1 tablet (8 mg total) 2 (two) times daily as needed by mouth., Disp: 30 tablet, Rfl: 2 .  pravastatin (PRAVACHOL) 10 MG tablet, Take 10 mg by mouth daily., Disp: , Rfl:  .  prochlorperazine (COMPAZINE) 10 MG tablet, Take 1 tablet (10 mg total) every 6 (six) hours as needed by mouth (Nausea or vomiting)., Disp: 60 tablet, Rfl: 2 .  sucralfate (CARAFATE) 1 g tablet, Take 1 tablet (1 g total) by mouth 3 (three) times daily. Dissolve in 2-3 tbsp warm water, swish and swallow., Disp: 90 tablet, Rfl: 3 No current facility-administered medications for this visit.   Facility-Administered Medications Ordered in Other Visits:  .  heparin lock flush 100 unit/mL, 250 Units, Intracatheter,  PRN, Ma Hillock, Sandeep, MD .  heparin lock flush 100 unit/mL, 500 Units, Intracatheter, PRN, Ma Hillock, Sandeep, MD .  sodium chloride 0.9 % injection 10 mL, 10 mL, Intracatheter, PRN, Ma Hillock, Sandeep, MD .  sodium chloride 0.9 % injection 10 mL, 10 mL, Intracatheter, PRN, Ma Hillock, Sandeep, MD .  sodium chloride 0.9 % injection 10 mL, 10 mL, Intracatheter, PRN, Ma Hillock, Sandeep, MD .  sodium chloride 0.9 % injection 10 mL, 10 mL, Intracatheter, PRN, Leia Alf, MD  Physical exam:  Vitals:   07/31/17 0943  BP: (!) 147/75  Pulse: 71  Resp: 20  Temp: (!) 96.6 F (35.9 C)  TempSrc: Tympanic  Weight: 116 lb (52.6 kg)   Physical Exam  Constitutional: She is oriented to person, place, and time and well-developed, well-nourished, and in no distress. She appears dehydrated.  HENT:  Head: Normocephalic and atraumatic.  Mouth/Throat: Mucous membranes are dry.  Eyes: Pupils are equal, round, and reactive to light.  Neck: Normal range of motion. Neck supple.  Cardiovascular: Normal rate and regular  rhythm.  Pulmonary/Chest: Effort normal and breath sounds normal.  Abdominal: Soft. Normal appearance and bowel sounds are normal.  Musculoskeletal: Normal range of motion.  Neurological: She is alert and oriented to person, place, and time.  Skin: Skin is warm, dry and intact.     CMP Latest Ref Rng & Units 07/31/2017  Glucose 65 - 99 mg/dL 108(H)  BUN 6 - 20 mg/dL 22(H)  Creatinine 0.44 - 1.00 mg/dL 0.99  Sodium 135 - 145 mmol/L 137  Potassium 3.5 - 5.1 mmol/L 3.7  Chloride 101 - 111 mmol/L 103  CO2 22 - 32 mmol/L 25  Calcium 8.9 - 10.3 mg/dL 8.7(L)  Total Protein 6.5 - 8.1 g/dL -  Total Bilirubin 0.3 - 1.2 mg/dL -  Alkaline Phos 38 - 126 U/L -  AST 15 - 41 U/L -  ALT 14 - 54 U/L -   CBC Latest Ref Rng & Units 07/31/2017  WBC 3.6 - 11.0 K/uL 8.2  Hemoglobin 12.0 - 16.0 g/dL 13.9  Hematocrit 35.0 - 47.0 % 41.2  Platelets 150 - 440 K/uL 242    No images are attached to the  encounter.  No results found.   Assessment and plan- Patient is a 76 y.o. female who presents with nausea, vomiting and weakness. On initial assessment patient is pale, dry mucous membranes and extremely weak sitting in a wheelchair. Patient's weight is down 4 pounds. Labs actually looked okay. Her kidney function is slightly elevated. Skin turgor is diminished. Electrolytes are okay. Her pressure slightly elevated. She is afebrile. She denies pain just fatigue.   1. STAT Labs. (CBC, METC) 2. Dehydration/Dry mucous membranes/poor appetite:1 liter NaCl in symptom management. 3. Nausea: 8 mg IV Zofran. Continue Antiemetics as prescribed.  4. Fatigue/Appetite: 10 mg IV Decadron. Dr. Donella Stade called patient and 4 mg Decadron tablets to help with nausea/vomiting, appetite stimulate  and fatigue.  5. Dysphagia: Dr. Donella Stade called her and 1 g tablet Carafate. Has dietary referral.  6. Patient is to return to clinic on Monday for daily radiation and then on Tuesday for radiation, labs, possible treatment and to see Dr. Grayland Ormond. She'll be reevaluated then..   Visit Diagnosis 1. Dehydration     Patient expressed understanding and was in agreement with this plan. She also understands that She can call clinic at any time with any questions, concerns, or complaints.   Greater than 50% was spent in counseling and coordination of care with this patient including but not limited to discussion of the relevant topics above (See A&P) including, but not limited to diagnosis and management of acute and chronic medical conditions.    Faythe Casa, AGNP-C [facility]- [phone] Pager- [phone] 08/03/2017 3:05 PM

## 2017-08-01 NOTE — Progress Notes (Signed)
[facility]  Telephone:([phone] [phone]  ID: Dana Perez OB: Feb 24, 1941  MR#: [id number]  IWP#:[id number]  Patient Care Team: Glendon Axe, MD as PCP - General (Internal Medicine)  CHIEF COMPLAINT: Stage IVa head and neck squamous cell carcinoma, adenocarcinoma of the colon.  INTERVAL HISTORY: Patient returns to clinic today to initiate cycle 4 of weekly cisplatin.  She had a poor appetite over the past week with increased nausea dry heaving.  She also has increased constipation. She has some mild dysphagia but otherwise tolerating her XRT. She has no neurologic complaints. She increased weakness and fatigue. She has no chest pain or shortness of breath. She denies any constipation or diarrhea.  She has no melena or hematochezia.  She has no urinary complaints. Patient offers no further specific complaints today.  REVIEW OF SYSTEMS:   Review of Systems  Constitutional: Positive for malaise/fatigue and weight loss. Negative for fever.  HENT: Negative.  Negative for sore throat.   Respiratory: Negative.  Negative for cough and shortness of breath.   Cardiovascular: Negative.  Negative for chest pain and leg swelling.  Gastrointestinal: Positive for constipation, nausea and vomiting. Negative for abdominal pain.  Genitourinary: Negative.   Musculoskeletal: Negative.   Skin: Negative.  Negative for rash.  Neurological: Positive for weakness.  Psychiatric/Behavioral: Negative.  The patient is not nervous/anxious.     As per HPI. Otherwise, a complete review of systems is negative.  PAST MEDICAL HISTORY: Past Medical History:  Diagnosis Date  . Adenocarcinoma of colon ([place])   . Benign neoplasm of ascending colon   . Essential (primary) hypertension 06/13/2014  . Essential hypertension 06/13/2014  . Goals of care, counseling/discussion 07/14/2017  . HLD (hyperlipidemia) 06/13/2014  . Hypercholesteremia   . Hyperlipidemia   . Hyperlipidemia,  unspecified 06/13/2014  . Hypertension   . Multiple thyroid nodules 01/22/2012  . Osteoporosis   . Polycythemia, secondary 12/26/2014  . Pure hypercholesterolemia 03/15/2015  . Thyroid nodule     PAST SURGICAL HISTORY: Past Surgical History:  Procedure Laterality Date  . ABDOMINAL HYSTERECTOMY    . COLONOSCOPY WITH PROPOFOL N/A 07/02/2017   Procedure: COLONOSCOPY WITH PROPOFOL;  Surgeon: Lucilla Lame, MD;  Location: [facility];  Service: Endoscopy;  Laterality: N/A;  . POLYPECTOMY N/A 07/02/2017   Procedure: POLYPECTOMY;  Surgeon: Lucilla Lame, MD;  Location: [facility];  Service: Endoscopy;  Laterality: N/A;  . WRIST FRACTURE SURGERY  08/2009   badly broken    FAMILY HISTORY Family History  Problem Relation Age of Onset  . Heart disease Mother   . Diabetes Father        ADVANCED DIRECTIVES:    HEALTH MAINTENANCE: Social History   Tobacco Use  . Smoking status: Current Every Day Smoker    Packs/day: 0.25    Types: Cigarettes  . Smokeless tobacco: Never Used  . Tobacco comment: occasional smoker  Substance Use Topics  . Alcohol use: No  . Drug use: No     Allergies  Allergen Reactions  . Other Other (See Comments) and Rash    TDAP    Current Outpatient Medications  Medication Sig Dispense Refill  . amLODipine (NORVASC) 2.5 MG tablet Take 2.5 mg by mouth daily.    . Calcium Carbonate-Vitamin D (CALCIUM-VITAMIN D) 500-200 MG-UNIT per tablet Take 2 tablets by mouth daily.    Marland Kitchen dexamethasone (DECADRON) 4 MG tablet Take 1 tablet (4 mg total) by mouth daily. 30 tablet 0  . ondansetron (ZOFRAN) 8 MG  tablet Take 1 tablet (8 mg total) 2 (two) times daily as needed by mouth. 30 tablet 2  . pravastatin (PRAVACHOL) 10 MG tablet Take 10 mg by mouth daily.    . prochlorperazine (COMPAZINE) 10 MG tablet Take 1 tablet (10 mg total) every 6 (six) hours as needed by mouth (Nausea or vomiting). 60 tablet 2  . sucralfate (CARAFATE) 1 g tablet Take 1 tablet (1 g  total) by mouth 3 (three) times daily. Dissolve in 2-3 tbsp warm water, swish and swallow. 90 tablet 3   No current facility-administered medications for this visit.    Facility-Administered Medications Ordered in Other Visits  Medication Dose Route Frequency Provider Last Rate Last Dose  . heparin lock flush 100 unit/mL  250 Units Intracatheter PRN Leia Alf, MD      . heparin lock flush 100 unit/mL  500 Units Intracatheter PRN Leia Alf, MD      . sodium chloride 0.9 % injection 10 mL  10 mL Intracatheter PRN Leia Alf, MD      . sodium chloride 0.9 % injection 10 mL  10 mL Intracatheter PRN Leia Alf, MD      . sodium chloride 0.9 % injection 10 mL  10 mL Intracatheter PRN Ma Hillock, Sandeep, MD      . sodium chloride 0.9 % injection 10 mL  10 mL Intracatheter PRN Leia Alf, MD        OBJECTIVE: Vitals:   08/04/17 0927  BP: 104/67  Pulse: 71  Resp: 18  Temp: (!) 96.8 F (36 C)     Body mass index is 20.37 kg/m.    ECOG FS:0 - Asymptomatic  General: Well-developed, well-nourished, no acute distress. Eyes: Pink conjunctiva, anicteric sclera. HEENT: Easily palpable lymph node in right cervical chain, slightly decreased in size.   Lungs: Clear to auscultation bilaterally. Heart: Regular rate and rhythm. No rubs, murmurs, or gallops. Abdomen: Soft, nontender, nondistended. No organomegaly noted, normoactive bowel sounds. Musculoskeletal: No edema, cyanosis, or clubbing. Neuro: Alert, answering all questions appropriately. Cranial nerves grossly intact. Skin: No rashes or petechiae noted. Psych: Normal affect.   LAB RESULTS:  Lab Results  Component Value Date   NA 138 08/04/2017   K 3.3 (L) 08/04/2017   CL 103 08/04/2017   CO2 27 08/04/2017   GLUCOSE 107 (H) 08/04/2017   BUN 25 (H) 08/04/2017   CREATININE 1.04 (H) 08/04/2017   CALCIUM 8.8 (L) 08/04/2017   PROT 6.5 08/04/2017   ALBUMIN 3.7 08/04/2017   AST 22 08/04/2017   ALT 19 08/04/2017    ALKPHOS 75 08/04/2017   BILITOT 0.3 08/04/2017   GFRNONAA 52 (L) 08/04/2017   GFRAA 60 (L) 08/04/2017    Lab Results  Component Value Date   WBC 10.3 08/04/2017   NEUTROABS 8.5 (H) 08/04/2017   HGB 13.7 08/04/2017   HCT 40.8 08/04/2017   MCV 90.0 08/04/2017   PLT 223 08/04/2017     STUDIES: No results found.  ASSESSMENT: Stage IVa head and neck squamous cell carcinoma, adenocarcinoma of the colon  PLAN:    1. 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  Patient was also discussed with ENT as well as at cancer conference.  She will benefit from concurrent chemotherapy using weekly cisplatin along with daily XRT.  Continue daily XRT.  Delay cycle 4 of weekly cisplatin secondary to poor appetite and decreased performance status.  Patient will instead receive 1 L of IV fluids.  Plan to do 5-8 cycles and then reimage 3 months after the completion of treatment with PET scan.  Return to clinic on Monday, August 10, 2017 further evaluation and reconsideration of cycle 4. 2.  Adenocarcinoma of the colon: Appreciate surgical input.  Patient has requested to delay surgery or treatment until after her head and neck treatments are complete.  CEA is within normal limits. 3.  Nausea and vomiting: Continue Compazine and Zofran as prescribed.  IV fluids along with IV Zofran and Decadron today. 4.  Weight loss: Patient was given a referral to dietary.  Approximately 30 minutes was spent in discussion of which greater than 50% was consultation.   [name], MD 08/07/17 7:05 AM

## 2017-08-03 ENCOUNTER — Inpatient Hospital Stay: Payer: PPO

## 2017-08-03 ENCOUNTER — Ambulatory Visit
Admission: RE | Admit: 2017-08-03 | Discharge: 2017-08-03 | Disposition: A | Discharge status: Home / Self Care | Payer: PPO | Source: Ambulatory Visit | Attending: Radiation Oncology | Admitting: Radiation Oncology

## 2017-08-03 DIAGNOSIS — F17210 Nicotine dependence, cigarettes, uncomplicated: Secondary | ICD-10-CM | POA: Diagnosis not present

## 2017-08-03 DIAGNOSIS — Z510 Encounter for antineoplastic radiation therapy: Secondary | ICD-10-CM | POA: Diagnosis not present

## 2017-08-03 DIAGNOSIS — C760 Malignant neoplasm of head, face and neck: Secondary | ICD-10-CM | POA: Diagnosis not present

## 2017-08-03 NOTE — Progress Notes (Signed)
Nutrition Assessment   Reason for Assessment:   Head and neck cancer  ASSESSMENT:  76 year old female with stage IV head and neck squamous cell (lymph node on right neck) carcinoma and adenocarcinoma of colon.  Past medical history of HTN, HLD, polycythemia.    Met with patient and husband in clinic this am.  Patient reports appetite is fair.  Has been eating cornflakes, stew (vegetables and meat), jello, progresso chicken and dumpling soups, toast, danish, chipped beef with gravy.  Has not tried oral nutrition supplements yet but has some.  Reports nausea and has been taking medication.  No other nutrition impact symptoms reported today  Medications: Calcium-Vit D, decadron, zofran, compazine, carafate  Labs: reviewed  Anthropometrics:   Height: 64 inches Weight: 116 lb on 12/14 UBW: 122-124 lb (noted 124 lb on 07/06/17) BMI: 19  6% weight loss in the last month, significant 1% weight loss in the last year (Noted weight of 118 lb on 07/21/2016)   Estimated Energy Needs  Kcals: 1500-1800 calories/d Protein: 75-90 g protein Fluid: 1.8 L/d  NUTRITION DIAGNOSIS: Inadequate oral intake related to cancer and cancer related treatment as evidenced by nausea, vomiting, weight loss, decreased intake   MALNUTRITION DIAGNOSIS: continue to monitor   INTERVENTION:   Discussed oral nutrition supplements and difference between supplements.  Patient given samples of supplements today to try. Encouraged 1-2 per day.  Discussed ways to increase calories and protein in supplements. Discussed foods high in calories and protein and ways to increase calories in current meal pattern.  Handout given on "Soft Moist Protein Foods." Contact information given to patient    MONITORING, EVALUATION, GOAL: weight trends, intake   NEXT VISIT: January 3rd after radiation  Joli B. Allen, RD, LDN Registered Dietitian [phone] (pager)       

## 2017-08-04 ENCOUNTER — Ambulatory Visit
Admission: RE | Admit: 2017-08-04 | Discharge: 2017-08-04 | Disposition: A | Discharge status: Home / Self Care | Payer: PPO | Source: Ambulatory Visit | Attending: Radiation Oncology | Admitting: Radiation Oncology

## 2017-08-04 ENCOUNTER — Inpatient Hospital Stay (HOSPITAL_BASED_OUTPATIENT_CLINIC_OR_DEPARTMENT_OTHER): Payer: PPO | Admitting: Oncology

## 2017-08-04 ENCOUNTER — Inpatient Hospital Stay: Payer: PPO

## 2017-08-04 VITALS — BP 104/67 | HR 71 | Temp 96.8°F | Resp 18 | Wt 118.7 lb

## 2017-08-04 VITALS — BP 145/73 | HR 71 | Resp 20

## 2017-08-04 DIAGNOSIS — F17210 Nicotine dependence, cigarettes, uncomplicated: Secondary | ICD-10-CM | POA: Diagnosis not present

## 2017-08-04 DIAGNOSIS — R531 Weakness: Secondary | ICD-10-CM

## 2017-08-04 DIAGNOSIS — Z510 Encounter for antineoplastic radiation therapy: Secondary | ICD-10-CM | POA: Diagnosis not present

## 2017-08-04 DIAGNOSIS — R630 Anorexia: Secondary | ICD-10-CM

## 2017-08-04 DIAGNOSIS — K5900 Constipation, unspecified: Secondary | ICD-10-CM

## 2017-08-04 DIAGNOSIS — E860 Dehydration: Secondary | ICD-10-CM

## 2017-08-04 DIAGNOSIS — R5383 Other fatigue: Secondary | ICD-10-CM

## 2017-08-04 DIAGNOSIS — C189 Malignant neoplasm of colon, unspecified: Secondary | ICD-10-CM | POA: Diagnosis not present

## 2017-08-04 DIAGNOSIS — E7800 Pure hypercholesterolemia, unspecified: Secondary | ICD-10-CM | POA: Diagnosis not present

## 2017-08-04 DIAGNOSIS — M810 Age-related osteoporosis without current pathological fracture: Secondary | ICD-10-CM

## 2017-08-04 DIAGNOSIS — R5381 Other malaise: Secondary | ICD-10-CM | POA: Diagnosis not present

## 2017-08-04 DIAGNOSIS — Z5111 Encounter for antineoplastic chemotherapy: Secondary | ICD-10-CM | POA: Diagnosis not present

## 2017-08-04 DIAGNOSIS — Z79899 Other long term (current) drug therapy: Secondary | ICD-10-CM

## 2017-08-04 DIAGNOSIS — C760 Malignant neoplasm of head, face and neck: Secondary | ICD-10-CM | POA: Diagnosis not present

## 2017-08-04 DIAGNOSIS — I10 Essential (primary) hypertension: Secondary | ICD-10-CM | POA: Diagnosis not present

## 2017-08-04 DIAGNOSIS — Z90710 Acquired absence of both cervix and uterus: Secondary | ICD-10-CM

## 2017-08-04 DIAGNOSIS — R1310 Dysphagia, unspecified: Secondary | ICD-10-CM | POA: Diagnosis not present

## 2017-08-04 DIAGNOSIS — C4442 Squamous cell carcinoma of skin of scalp and neck: Secondary | ICD-10-CM

## 2017-08-04 DIAGNOSIS — I700 Atherosclerosis of aorta: Secondary | ICD-10-CM

## 2017-08-04 DIAGNOSIS — R112 Nausea with vomiting, unspecified: Secondary | ICD-10-CM

## 2017-08-04 LAB — CBC WITH DIFFERENTIAL/PLATELET
Basophils Absolute: 0 10*3/uL (ref 0–0.1)
Basophils Relative: 0 %
Eosinophils Absolute: 0 10*3/uL (ref 0–0.7)
Eosinophils Relative: 0 %
HEMATOCRIT: 40.8 % (ref 35.0–47.0)
Hemoglobin: 13.7 g/dL (ref 12.0–16.0)
LYMPHS ABS: 1 10*3/uL (ref 1.0–3.6)
LYMPHS PCT: 10 %
MCH: 30.2 pg (ref 26.0–34.0)
MCHC: 33.5 g/dL (ref 32.0–36.0)
MCV: 90 fL (ref 80.0–100.0)
MONOS PCT: 7 %
Monocytes Absolute: 0.7 10*3/uL (ref 0.2–0.9)
NEUTROS ABS: 8.5 10*3/uL — AB (ref 1.4–6.5)
NEUTROS PCT: 83 %
Platelets: 223 10*3/uL (ref 150–440)
RBC: 4.53 MIL/uL (ref 3.80–5.20)
RDW: 12.2 % (ref 11.5–14.5)
WBC: 10.3 10*3/uL (ref 3.6–11.0)

## 2017-08-04 LAB — COMPREHENSIVE METABOLIC PANEL
ALT: 19 U/L (ref 14–54)
ANION GAP: 8 (ref 5–15)
AST: 22 U/L (ref 15–41)
Albumin: 3.7 g/dL (ref 3.5–5.0)
Alkaline Phosphatase: 75 U/L (ref 38–126)
BILIRUBIN TOTAL: 0.3 mg/dL (ref 0.3–1.2)
BUN: 25 mg/dL — ABNORMAL HIGH (ref 6–20)
CHLORIDE: 103 mmol/L (ref 101–111)
CO2: 27 mmol/L (ref 22–32)
Calcium: 8.8 mg/dL — ABNORMAL LOW (ref 8.9–10.3)
Creatinine, Ser: 1.04 mg/dL — ABNORMAL HIGH (ref 0.44–1.00)
GFR, EST AFRICAN AMERICAN: 60 mL/min — AB (ref >60)
GFR, EST NON AFRICAN AMERICAN: 52 mL/min — AB (ref >60)
GLUCOSE: 107 mg/dL — AB (ref 65–99)
Potassium: 3.3 mmol/L — ABNORMAL LOW (ref 3.5–5.1)
Sodium: 138 mmol/L (ref 135–145)
Total Protein: 6.5 g/dL (ref 6.5–8.1)

## 2017-08-04 MED ORDER — SODIUM CHLORIDE 0.9 % IV SOLN
Freq: Once | INTRAVENOUS | Status: AC
  Administered 2017-08-04: 10:00:00 via INTRAVENOUS
  Filled 2017-08-04: qty 1000

## 2017-08-04 MED ORDER — ONDANSETRON HCL 4 MG/2ML IJ SOLN: 8.0000 mg | Freq: Once | INTRAMUSCULAR | Status: DC

## 2017-08-04 MED ORDER — SODIUM CHLORIDE 0.9 % IV SOLN: Freq: Once | INTRAVENOUS | Status: DC

## 2017-08-04 MED ORDER — DEXAMETHASONE SODIUM PHOSPHATE 10 MG/ML IJ SOLN: 10.0000 mg | Freq: Once | INTRAMUSCULAR | Status: DC

## 2017-08-04 MED ORDER — DEXAMETHASONE SODIUM PHOSPHATE 10 MG/ML IJ SOLN
10.0000 mg | Freq: Once | INTRAMUSCULAR | Status: AC
  Administered 2017-08-04: 10 mg via INTRAVENOUS
  Filled 2017-08-04: qty 1

## 2017-08-04 MED ORDER — ONDANSETRON HCL 4 MG/2ML IJ SOLN
8.0000 mg | Freq: Once | INTRAMUSCULAR | Status: AC
  Administered 2017-08-04: 8 mg via INTRAVENOUS
  Filled 2017-08-04: qty 4

## 2017-08-04 NOTE — Progress Notes (Signed)
Per Dr. Grayland Ormond, no treatment today for patient.  Pt to get IV zofran and decadron as well as 1 liter NS IV over 1 hour.  Pt tolerated infusion well and left the chemo area stable and ambulatory with her family.

## 2017-08-04 NOTE — Progress Notes (Signed)
Patient presents for follow up, she mentions she has had some nausea and dry heaving, she has 50% appetite, and she also has constipation issues only having one bowl movement  a week.

## 2017-08-05 ENCOUNTER — Ambulatory Visit
Admission: RE | Admit: 2017-08-05 | Discharge: 2017-08-05 | Disposition: A | Discharge status: Home / Self Care | Payer: PPO | Source: Ambulatory Visit | Attending: Radiation Oncology | Admitting: Radiation Oncology

## 2017-08-05 DIAGNOSIS — F17210 Nicotine dependence, cigarettes, uncomplicated: Secondary | ICD-10-CM | POA: Diagnosis not present

## 2017-08-05 DIAGNOSIS — Z510 Encounter for antineoplastic radiation therapy: Secondary | ICD-10-CM | POA: Diagnosis not present

## 2017-08-05 DIAGNOSIS — C760 Malignant neoplasm of head, face and neck: Secondary | ICD-10-CM | POA: Diagnosis not present

## 2017-08-06 ENCOUNTER — Ambulatory Visit
Admission: RE | Admit: 2017-08-06 | Discharge: 2017-08-06 | Disposition: A | Discharge status: Home / Self Care | Payer: PPO | Source: Ambulatory Visit | Attending: Radiation Oncology | Admitting: Radiation Oncology

## 2017-08-06 DIAGNOSIS — Z510 Encounter for antineoplastic radiation therapy: Secondary | ICD-10-CM | POA: Diagnosis not present

## 2017-08-06 DIAGNOSIS — C760 Malignant neoplasm of head, face and neck: Secondary | ICD-10-CM | POA: Diagnosis not present

## 2017-08-06 DIAGNOSIS — F17210 Nicotine dependence, cigarettes, uncomplicated: Secondary | ICD-10-CM | POA: Diagnosis not present

## 2017-08-07 ENCOUNTER — Inpatient Hospital Stay
Admission: EM | Admit: 2017-08-07 | Discharge: 2017-08-10 | DRG: 493 | Disposition: A | Discharge status: Skilled Nursing Facility | Payer: PPO | Attending: Internal Medicine | Admitting: Internal Medicine

## 2017-08-07 ENCOUNTER — Other Ambulatory Visit: Payer: Self-pay

## 2017-08-07 ENCOUNTER — Ambulatory Visit
Admission: RE | Admit: 2017-08-07 | Discharge: 2017-08-07 | Disposition: A | Discharge status: Home / Self Care | Payer: PPO | Source: Ambulatory Visit | Attending: Radiation Oncology | Admitting: Radiation Oncology

## 2017-08-07 ENCOUNTER — Emergency Department: Payer: PPO

## 2017-08-07 DIAGNOSIS — F17210 Nicotine dependence, cigarettes, uncomplicated: Secondary | ICD-10-CM | POA: Diagnosis not present

## 2017-08-07 DIAGNOSIS — S93401A Sprain of unspecified ligament of right ankle, initial encounter: Secondary | ICD-10-CM | POA: Diagnosis present

## 2017-08-07 DIAGNOSIS — E7800 Pure hypercholesterolemia, unspecified: Secondary | ICD-10-CM | POA: Diagnosis not present

## 2017-08-07 DIAGNOSIS — Z716 Tobacco abuse counseling: Secondary | ICD-10-CM

## 2017-08-07 DIAGNOSIS — Z23 Encounter for immunization: Secondary | ICD-10-CM

## 2017-08-07 DIAGNOSIS — C189 Malignant neoplasm of colon, unspecified: Secondary | ICD-10-CM | POA: Diagnosis not present

## 2017-08-07 DIAGNOSIS — S8262XA Displaced fracture of lateral malleolus of left fibula, initial encounter for closed fracture: Secondary | ICD-10-CM | POA: Diagnosis not present

## 2017-08-07 DIAGNOSIS — S82852A Displaced trimalleolar fracture of left lower leg, initial encounter for closed fracture: Principal | ICD-10-CM | POA: Diagnosis present

## 2017-08-07 DIAGNOSIS — Z90710 Acquired absence of both cervix and uterus: Secondary | ICD-10-CM

## 2017-08-07 DIAGNOSIS — T148XXA Other injury of unspecified body region, initial encounter: Secondary | ICD-10-CM

## 2017-08-07 DIAGNOSIS — S99911A Unspecified injury of right ankle, initial encounter: Secondary | ICD-10-CM | POA: Diagnosis not present

## 2017-08-07 DIAGNOSIS — S82892A Other fracture of left lower leg, initial encounter for closed fracture: Secondary | ICD-10-CM | POA: Diagnosis present

## 2017-08-07 DIAGNOSIS — I10 Essential (primary) hypertension: Secondary | ICD-10-CM | POA: Diagnosis not present

## 2017-08-07 DIAGNOSIS — C760 Malignant neoplasm of head, face and neck: Secondary | ICD-10-CM | POA: Diagnosis not present

## 2017-08-07 DIAGNOSIS — Z5111 Encounter for antineoplastic chemotherapy: Secondary | ICD-10-CM | POA: Diagnosis not present

## 2017-08-07 DIAGNOSIS — S82891D Other fracture of right lower leg, subsequent encounter for closed fracture with routine healing: Secondary | ICD-10-CM | POA: Diagnosis not present

## 2017-08-07 DIAGNOSIS — M810 Age-related osteoporosis without current pathological fracture: Secondary | ICD-10-CM | POA: Diagnosis present

## 2017-08-07 DIAGNOSIS — E785 Hyperlipidemia, unspecified: Secondary | ICD-10-CM | POA: Diagnosis not present

## 2017-08-07 DIAGNOSIS — Z923 Personal history of irradiation: Secondary | ICD-10-CM | POA: Diagnosis not present

## 2017-08-07 DIAGNOSIS — C7970 Secondary malignant neoplasm of unspecified adrenal gland: Secondary | ICD-10-CM | POA: Diagnosis not present

## 2017-08-07 DIAGNOSIS — M6281 Muscle weakness (generalized): Secondary | ICD-10-CM | POA: Diagnosis not present

## 2017-08-07 DIAGNOSIS — S92151A Displaced avulsion fracture (chip fracture) of right talus, initial encounter for closed fracture: Secondary | ICD-10-CM | POA: Diagnosis not present

## 2017-08-07 DIAGNOSIS — Z6820 Body mass index (BMI) 20.0-20.9, adult: Secondary | ICD-10-CM

## 2017-08-07 DIAGNOSIS — Z510 Encounter for antineoplastic radiation therapy: Secondary | ICD-10-CM | POA: Diagnosis not present

## 2017-08-07 DIAGNOSIS — S82899A Other fracture of unspecified lower leg, initial encounter for closed fracture: Secondary | ICD-10-CM | POA: Diagnosis not present

## 2017-08-07 DIAGNOSIS — Z8249 Family history of ischemic heart disease and other diseases of the circulatory system: Secondary | ICD-10-CM

## 2017-08-07 DIAGNOSIS — E860 Dehydration: Secondary | ICD-10-CM | POA: Diagnosis present

## 2017-08-07 DIAGNOSIS — E440 Moderate protein-calorie malnutrition: Secondary | ICD-10-CM | POA: Diagnosis present

## 2017-08-07 DIAGNOSIS — E042 Nontoxic multinodular goiter: Secondary | ICD-10-CM | POA: Diagnosis present

## 2017-08-07 DIAGNOSIS — R531 Weakness: Secondary | ICD-10-CM | POA: Diagnosis not present

## 2017-08-07 DIAGNOSIS — S8251XA Displaced fracture of medial malleolus of right tibia, initial encounter for closed fracture: Secondary | ICD-10-CM | POA: Diagnosis not present

## 2017-08-07 DIAGNOSIS — S82891A Other fracture of right lower leg, initial encounter for closed fracture: Secondary | ICD-10-CM

## 2017-08-07 DIAGNOSIS — Z7401 Bed confinement status: Secondary | ICD-10-CM | POA: Diagnosis not present

## 2017-08-07 DIAGNOSIS — D751 Secondary polycythemia: Secondary | ICD-10-CM | POA: Diagnosis present

## 2017-08-07 DIAGNOSIS — Z9221 Personal history of antineoplastic chemotherapy: Secondary | ICD-10-CM | POA: Diagnosis not present

## 2017-08-07 DIAGNOSIS — Z79899 Other long term (current) drug therapy: Secondary | ICD-10-CM

## 2017-08-07 DIAGNOSIS — S82892D Other fracture of left lower leg, subsequent encounter for closed fracture with routine healing: Secondary | ICD-10-CM | POA: Diagnosis not present

## 2017-08-07 LAB — URINALYSIS, COMPLETE (UACMP) WITH MICROSCOPIC
BILIRUBIN URINE: NEGATIVE
Bacteria, UA: NONE SEEN
GLUCOSE, UA: NEGATIVE mg/dL
HGB URINE DIPSTICK: NEGATIVE
KETONES UR: NEGATIVE mg/dL
NITRITE: NEGATIVE
PH: 5 (ref 5.0–8.0)
PROTEIN: NEGATIVE mg/dL
Specific Gravity, Urine: 1.014 (ref 1.005–1.030)

## 2017-08-07 LAB — COMPREHENSIVE METABOLIC PANEL
ALBUMIN: 3.5 g/dL (ref 3.5–5.0)
ALK PHOS: 70 U/L (ref 38–126)
ALT: 19 U/L (ref 14–54)
ANION GAP: 8 (ref 5–15)
AST: 22 U/L (ref 15–41)
BUN: 18 mg/dL (ref 6–20)
CALCIUM: 8.5 mg/dL — AB (ref 8.9–10.3)
CO2: 26 mmol/L (ref 22–32)
Chloride: 103 mmol/L (ref 101–111)
Creatinine, Ser: 1.04 mg/dL — ABNORMAL HIGH (ref 0.44–1.00)
GFR calc non Af Amer: 52 mL/min — ABNORMAL LOW (ref >60)
GFR, EST AFRICAN AMERICAN: 60 mL/min — AB (ref >60)
Glucose, Bld: 96 mg/dL (ref 65–99)
POTASSIUM: 3.5 mmol/L (ref 3.5–5.1)
SODIUM: 137 mmol/L (ref 135–145)
TOTAL PROTEIN: 5.9 g/dL — AB (ref 6.5–8.1)
Total Bilirubin: 0.4 mg/dL (ref 0.3–1.2)

## 2017-08-07 LAB — CBC
HCT: 38.3 % (ref 35.0–47.0)
HEMOGLOBIN: 12.8 g/dL (ref 12.0–16.0)
MCH: 30.4 pg (ref 26.0–34.0)
MCHC: 33.4 g/dL (ref 32.0–36.0)
MCV: 90.9 fL (ref 80.0–100.0)
Platelets: 176 10*3/uL (ref 150–440)
RBC: 4.21 MIL/uL (ref 3.80–5.20)
RDW: 12.3 % (ref 11.5–14.5)
WBC: 10.9 10*3/uL (ref 3.6–11.0)

## 2017-08-07 LAB — PROTIME-INR
INR: 0.92
PROTHROMBIN TIME: 12.3 s (ref 11.4–15.2)

## 2017-08-07 LAB — APTT

## 2017-08-07 LAB — SURGICAL PCR SCREEN
MRSA, PCR: NEGATIVE
Staphylococcus aureus: NEGATIVE

## 2017-08-07 LAB — TROPONIN I: Troponin I: 0.04 ng/mL (ref <0.03)

## 2017-08-07 MED ORDER — MIDAZOLAM HCL 2 MG/2ML IJ SOLN
2.0000 mg | Freq: Once | INTRAMUSCULAR | Status: AC
  Administered 2017-08-07: 2 mg via INTRAVENOUS

## 2017-08-07 MED ORDER — SODIUM CHLORIDE 0.9 % IV SOLN
1000.0000 mL | Freq: Once | INTRAVENOUS | Status: AC
  Administered 2017-08-07: 1000 mL via INTRAVENOUS

## 2017-08-07 MED ORDER — MORPHINE SULFATE (PF) 4 MG/ML IV SOLN
4.0000 mg | Freq: Once | INTRAVENOUS | Status: AC
  Administered 2017-08-07: 4 mg via INTRAVENOUS

## 2017-08-07 MED ORDER — SENNOSIDES-DOCUSATE SODIUM 8.6-50 MG PO TABS: 1.0000 | ORAL_TABLET | Freq: Every evening | ORAL | Status: DC | PRN

## 2017-08-07 MED ORDER — MORPHINE SULFATE (PF) 4 MG/ML IV SOLN
INTRAVENOUS | Status: AC
  Administered 2017-08-07: 4 mg via INTRAVENOUS
  Filled 2017-08-07: qty 1

## 2017-08-07 MED ORDER — MORPHINE SULFATE (PF) 2 MG/ML IV SOLN
2.0000 mg | INTRAVENOUS | Status: DC | PRN
  Administered 2017-08-07 – 2017-08-08 (×2): 2 mg via INTRAVENOUS
  Filled 2017-08-07 (×2): qty 1

## 2017-08-07 MED ORDER — ONDANSETRON HCL 4 MG/2ML IJ SOLN
4.0000 mg | Freq: Once | INTRAMUSCULAR | Status: AC
  Administered 2017-08-07: 4 mg via INTRAVENOUS

## 2017-08-07 MED ORDER — ACETAMINOPHEN 325 MG PO TABS: 650.0000 mg | ORAL_TABLET | Freq: Four times a day (QID) | ORAL | Status: DC | PRN

## 2017-08-07 MED ORDER — ONDANSETRON HCL 4 MG/2ML IJ SOLN
INTRAMUSCULAR | Status: AC
  Administered 2017-08-07: 4 mg via INTRAVENOUS
  Filled 2017-08-07: qty 2

## 2017-08-07 MED ORDER — MIDAZOLAM HCL 5 MG/5ML IJ SOLN
INTRAMUSCULAR | Status: AC
  Administered 2017-08-07: 2 mg via INTRAVENOUS
  Filled 2017-08-07: qty 5

## 2017-08-07 MED ORDER — BISACODYL 5 MG PO TBEC: 5.0000 mg | DELAYED_RELEASE_TABLET | Freq: Every day | ORAL | Status: DC | PRN

## 2017-08-07 MED ORDER — DEXAMETHASONE 4 MG PO TABS
4.0000 mg | ORAL_TABLET | Freq: Every day | ORAL | Status: DC
  Administered 2017-08-09 – 2017-08-10 (×2): 4 mg via ORAL
  Filled 2017-08-07 (×3): qty 1

## 2017-08-07 MED ORDER — AMLODIPINE BESYLATE 10 MG PO TABS
10.0000 mg | ORAL_TABLET | Freq: Every day | ORAL | Status: DC
  Administered 2017-08-07 – 2017-08-10 (×2): 10 mg via ORAL
  Filled 2017-08-07 (×2): qty 1

## 2017-08-07 MED ORDER — PRAVASTATIN SODIUM 20 MG PO TABS
10.0000 mg | ORAL_TABLET | Freq: Every day | ORAL | Status: DC
  Administered 2017-08-09 – 2017-08-10 (×2): 10 mg via ORAL
  Filled 2017-08-07 (×3): qty 1

## 2017-08-07 MED ORDER — ACETAMINOPHEN 650 MG RE SUPP: 650.0000 mg | Freq: Four times a day (QID) | RECTAL | Status: DC | PRN

## 2017-08-07 MED ORDER — ONDANSETRON HCL 4 MG PO TABS: 4.0000 mg | ORAL_TABLET | Freq: Four times a day (QID) | ORAL | Status: DC | PRN

## 2017-08-07 MED ORDER — INFLUENZA VAC SPLIT HIGH-DOSE 0.5 ML IM SUSY
0.5000 mL | PREFILLED_SYRINGE | INTRAMUSCULAR | Status: AC
  Administered 2017-08-10: 0.5 mL via INTRAMUSCULAR
  Filled 2017-08-07: qty 0.5

## 2017-08-07 MED ORDER — PROCHLORPERAZINE MALEATE 10 MG PO TABS
10.0000 mg | ORAL_TABLET | Freq: Four times a day (QID) | ORAL | Status: DC | PRN
  Filled 2017-08-07: qty 1

## 2017-08-07 MED ORDER — ONDANSETRON HCL 4 MG/2ML IJ SOLN
4.0000 mg | Freq: Four times a day (QID) | INTRAMUSCULAR | Status: DC | PRN
  Administered 2017-08-07: 4 mg via INTRAVENOUS
  Filled 2017-08-07: qty 2

## 2017-08-07 MED ORDER — SUCRALFATE 1 G PO TABS
1.0000 g | ORAL_TABLET | Freq: Two times a day (BID) | ORAL | Status: DC
  Administered 2017-08-07 – 2017-08-10 (×5): 1 g via ORAL
  Filled 2017-08-07 (×5): qty 1

## 2017-08-07 MED ORDER — ALBUTEROL SULFATE (2.5 MG/3ML) 0.083% IN NEBU: 2.5000 mg | INHALATION_SOLUTION | RESPIRATORY_TRACT | Status: DC | PRN

## 2017-08-07 MED ORDER — SODIUM CHLORIDE 0.9 % IV SOLN
INTRAVENOUS | Status: DC
  Administered 2017-08-07: 18:00:00 via INTRAVENOUS

## 2017-08-07 MED ORDER — DEXTROSE 5 % IV SOLN
2.0000 g | INTRAVENOUS | Status: AC
  Administered 2017-08-08: 2 g via INTRAVENOUS
  Filled 2017-08-07: qty 2000

## 2017-08-07 MED ORDER — CEFAZOLIN SODIUM-DEXTROSE 2-4 GM/100ML-% IV SOLN
2.0000 g | INTRAVENOUS | Status: DC
  Filled 2017-08-07: qty 100

## 2017-08-07 NOTE — ED Provider Notes (Signed)
[facility] Emergency Department Provider Note   ____________________________________________    I have reviewed the triage vital signs and the nursing notes.   HISTORY  Chief Complaint Weakness     HPI Dana Perez is a 76 y.o. female who presents with complaints of weakness and fall.  Patient has a history of stage IV head and neck cancer for which she received radiation this morning.  When she got home she felt quite weak and apparently fell out of the car injuring both ankles.  She denies head injury.  No neck pain.  No chest pain or abdominal pain.  No fevers or chills.  Has not taken anything.  Complains of more severe pain in the left ankle and swelling.   Past Medical History:  Diagnosis Date  . Adenocarcinoma of colon ([place])   . Benign neoplasm of ascending colon   . Essential (primary) hypertension 06/13/2014  . Essential hypertension 06/13/2014  . Goals of care, counseling/discussion 07/14/2017  . HLD (hyperlipidemia) 06/13/2014  . Hypercholesteremia   . Hyperlipidemia   . Hyperlipidemia, unspecified 06/13/2014  . Hypertension   . Multiple thyroid nodules 01/22/2012  . Osteoporosis   . Polycythemia, secondary 12/26/2014  . Pure hypercholesterolemia 03/15/2015  . Thyroid nodule     Patient Active Problem List   Diagnosis Date Noted  . Closed left ankle fracture 08/07/2017  . Goals of care, counseling/discussion 07/14/2017  . Adenocarcinoma of colon ([place])   . Benign neoplasm of ascending colon   . 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 Erythrocytosis 07/17/2015  . Pure hypercholesterolemia 03/15/2015  . Polycythemia, secondary 12/26/2014  . HLD (hyperlipidemia) 06/13/2014  . Essential (primary) hypertension 06/13/2014  . Hyperlipidemia, unspecified 06/13/2014  . Essential hypertension 06/13/2014  . Multiple thyroid nodules 01/22/2012    Past Surgical History:  Procedure Laterality Date  . ABDOMINAL  HYSTERECTOMY    . COLONOSCOPY WITH PROPOFOL N/A 07/02/2017   Procedure: COLONOSCOPY WITH PROPOFOL;  Surgeon: Lucilla Lame, MD;  Location: [place];  Service: Endoscopy;  Laterality: N/A;  . POLYPECTOMY N/A 07/02/2017   Procedure: POLYPECTOMY;  Surgeon: Lucilla Lame, MD;  Location: [place];  Service: Endoscopy;  Laterality: N/A;  . WRIST FRACTURE SURGERY  08/2009   badly broken    Prior to Admission medications   Medication Sig Start Date End Date Taking? Authorizing Provider  amLODipine (NORVASC) 10 MG tablet Take 1 tablet by mouth daily. 07/29/17  Yes [provider]  dexamethasone (DECADRON) 4 MG tablet Take 1 tablet (4 mg total) by mouth daily. 07/31/17  Yes Chrystal, Eulas Post, MD  ondansetron (ZOFRAN) 8 MG tablet Take 1 tablet (8 mg total) 2 (two) times daily as needed by mouth. 07/01/17  Yes Finnegan, Kathlene November, MD  pravastatin (PRAVACHOL) 10 MG tablet Take 10 mg by mouth daily.   Yes [provider]  sucralfate (CARAFATE) 1 g tablet Take 1 tablet (1 g total) by mouth 3 (three) times daily. Dissolve in 2-3 tbsp warm water, swish and swallow. Patient taking differently: Take 1 g by mouth 2 (two) times daily. Dissolve in 2-3 tbsp warm water, swish and swallow. 07/31/17  Yes Chrystal, Eulas Post, MD  prochlorperazine (COMPAZINE) 10 MG tablet Take 1 tablet (10 mg total) every 6 (six) hours as needed by mouth (Nausea or vomiting). 07/01/17   [name], MD     Allergies Other  Family History  Problem Relation Age of Onset  . Heart disease Mother   .  Diabetes Father     Social History Social History   Tobacco Use  . Smoking status: Current Every Day Smoker    Packs/day: 0.25    Types: Cigarettes  . Smokeless tobacco: Never Used  . Tobacco comment: occasional smoker  Substance Use Topics  . Alcohol use: No  . Drug use: No    Review of Systems  Constitutional: No fever/chills Eyes: No visual changes.  ENT: No neck  pain Cardiovascular: Denies chest pain. Respiratory: No cough or shortness of breath Gastrointestinal: No abdominal pain.  No nausea, no vomiting.   Genitourinary: Negative for dysuria. Musculoskeletal: Ankle pain as above Skin: Negative for laceration.  Positive for swelling Neurological: Negative for headaches   ____________________________________________   PHYSICAL EXAM:  VITAL SIGNS: ED Triage Vitals  Enc Vitals Group     BP 08/07/17 1051 (!) 115/53     Pulse Rate 08/07/17 1051 85     Resp 08/07/17 1051 20     Temp 08/07/17 1051 98.3 F (36.8 C)     Temp Source 08/07/17 1051 Oral     SpO2 08/07/17 1051 100 %     Weight 08/07/17 1051 53.5 kg (118 lb)     Height 08/07/17 1051 1.626 m (5\' 4" )     Head Circumference --      Peak Flow --      Pain Score 08/07/17 1421 Asleep     Pain Loc --      Pain Edu? --      Excl. in [place]? --     Constitutional: Alert and oriented. No acute distress.  Eyes: Conjunctivae are normal.   Nose: No congestion/rhinnorhea. Mouth/Throat: Mucous membranes are moist.    Cardiovascular: Normal rate, regular rhythm. Grossly normal heart sounds.  Good peripheral circulation. Respiratory: Normal respiratory effort.  No retractions. Lungs CTAB. Gastrointestinal: Soft and nontender. No distention.  No CVA tenderness. Genitourinary: deferred Musculoskeletal: Significant left ankle lateral swelling, no skin break.  Mild right lateral ankle swelling Neurologic:  Normal speech and language. No gross focal neurologic deficits are appreciated.  Skin:  Skin is warm, dry and intact. No rash noted. Psychiatric: Mood and affect are normal. Speech and behavior are normal.  ____________________________________________   LABS (all labs ordered are listed, but only abnormal results are displayed)  Labs Reviewed  COMPREHENSIVE METABOLIC PANEL - Abnormal; Notable for the following components:      Result Value   Creatinine, Ser 1.04 (*)    Calcium 8.5 (*)     Total Protein 5.9 (*)    GFR calc non Af Amer 52 (*)    GFR calc Af Amer 60 (*)    All other components within normal limits  TROPONIN I - Abnormal; Notable for the following components:   Troponin I 0.04 (*)    All other components within normal limits  APTT - Abnormal; Notable for the following components:   aPTT <24 (*)    All other components within normal limits  CBC  PROTIME-INR  URINALYSIS, COMPLETE (UACMP) WITH MICROSCOPIC   ____________________________________________  EKG  None ____________________________________________  RADIOLOGY  Left ankle fracture dislocation Right ankle mild avulsion fracture ____________________________________________   PROCEDURES  Procedure(s) performed: No  Procedures   Critical Care performed: No ____________________________________________   INITIAL IMPRESSION / ASSESSMENT AND PLAN / ED COURSE  Pertinent labs & imaging results that were available during my care of the patient were reviewed by me and considered in my medical decision making (see chart for details).  Patient presented after fall.  Lab work is overall reassuring, IV fluids were given for presumed dehydration.  Discussed with Dr. Marry Guan regarding the left ankle he came to the ED and reduced it, splinted.  Discussed with oncology and admitted to the hospitalist service    ____________________________________________   FINAL CLINICAL IMPRESSION(S) / ED DIAGNOSES  Final diagnoses:  Closed fracture of both ankles, initial encounter  Weakness  Dehydration        Note:  This document was prepared using Dragon voice recognition software and may include unintentional dictation errors.    Lavonia Drafts, MD 08/07/17 [phone]

## 2017-08-07 NOTE — ED Triage Notes (Signed)
Pt came to ED via pov. Had radiation therapy this morning, c/o weakness post radiation. [place] getting out of car, swelling and pain to left ankle.

## 2017-08-07 NOTE — Clinical Social Work Placement (Signed)
   CLINICAL SOCIAL WORK PLACEMENT  NOTE  Date:  08/07/2017  Patient Details  Name: Dana Perez MRN: [id number] Date of Birth: 08/11/1941  Clinical Social Work is seeking post-discharge placement for this patient at the [facility] level of care (*CSW will initial, date and re-position this form in  chart as items are completed):  Yes   Patient/family provided with [facility] Work Department's list of facilities offering this level of care within the geographic area requested by the patient (or if unable, by the patient's family).  Yes   Patient/family informed of their freedom to choose among providers that offer the needed level of care, that participate in Medicare, Medicaid or managed care program needed by the patient, have an available bed and are willing to accept the patient.  Yes   Patient/family informed of [facility]'s ownership interest in [facility] and [facility] as well as of the fact that they are under no obligation to receive care at these facilities.  PASRR submitted to EDS on 08/07/17     PASRR number received on 08/07/17     Existing PASRR number confirmed on       FL2 transmitted to all facilities in geographic area requested by pt/family on 08/07/17     FL2 transmitted to all facilities within larger geographic area on       Patient informed that his/her managed care company has contracts with or will negotiate with certain facilities, including the following:            Patient/family informed of bed offers received.  Patient chooses bed at       Physician recommends and patient chooses bed at      Patient to be transferred to   on  .  Patient to be transferred to facility by       Patient family notified on   of transfer.  Name of family member notified:        PHYSICIAN       Additional Comment:    _______________________________________________ Eddy Termine, Veronia Beets, LCSW 08/07/2017, 4:44 PM

## 2017-08-07 NOTE — Consult Note (Signed)
ORTHOPAEDIC CONSULTATION  PATIENT NAME: Dana Perez DOB: August 27, 1940  MRN: [id number]  REQUESTING PHYSICIAN: Demetrios Loll, MD  Chief Complaint: Bilateral ankle pain  HPI: CHRISHELLE ZITO is a 76 y.o. female who received radiation treatment for head and neck cancer earlier this morning.  When the patient arrived home she is extremely weak and apparently fell while getting out of the car, sustaining an injury to both ankles.  She states that the right ankle pain was mild with the left ankle pain was severe with an obvious deformity noted.  She was unable to stand or bear weight due to the ankle pain.  She denied any loss of consciousness.  She denied any other injuries.  The patient is currently receiving radiation therapy to the head neck as well as chemotherapy for treatment of adenocarcinoma of the colon.  Past Medical History:  Diagnosis Date  . Adenocarcinoma of colon ([place])   . Benign neoplasm of ascending colon   . Essential hypertension 06/13/2014  . Goals of care, counseling/discussion 07/14/2017  . HLD (hyperlipidemia) 06/13/2014  . Hypercholesteremia   . Hyperlipidemia   . Multiple thyroid nodules 01/22/2012  . Osteoporosis   . Polycythemia, secondary 12/26/2014  . Pure hypercholesterolemia 03/15/2015  . Thyroid nodule    Past Surgical History:  Procedure Laterality Date  . ABDOMINAL HYSTERECTOMY    . COLONOSCOPY WITH PROPOFOL N/A 07/02/2017   Procedure: COLONOSCOPY WITH PROPOFOL;  Surgeon: Lucilla Lame, MD;  Location: [place];  Service: Endoscopy;  Laterality: N/A;  . POLYPECTOMY N/A 07/02/2017   Procedure: POLYPECTOMY;  Surgeon: Lucilla Lame, MD;  Location: [place];  Service: Endoscopy;  Laterality: N/A;  . WRIST FRACTURE SURGERY  08/2009   badly broken   Social History   Socioeconomic History  . Marital status: Married    Spouse name: None  . Number of children: None  . Years of education: None  . Highest education level: None  Social  Needs  . Financial resource strain: None  . Food insecurity - worry: None  . Food insecurity - inability: None  . Transportation needs - medical: None  . Transportation needs - non-medical: None  Occupational History  . None  Tobacco Use  . Smoking status: Current Every Day Smoker    Packs/day: 0.25    Types: Cigarettes  . Smokeless tobacco: Never Used  . Tobacco comment: occasional smoker  Substance and Sexual Activity  . Alcohol use: No  . Drug use: No  . Sexual activity: None  Other Topics Concern  . None  Social History Narrative  . None   Family History  Problem Relation Age of Onset  . Heart disease Mother   . Diabetes Father    Allergies  Allergen Reactions  . Other Other (See Comments) and Rash    TDAP   Prior to Admission medications   Medication Sig Start Date End Date Taking? Authorizing Provider  amLODipine (NORVASC) 10 MG tablet Take 1 tablet by mouth daily. 07/29/17  Yes [provider]  dexamethasone (DECADRON) 4 MG tablet Take 1 tablet (4 mg total) by mouth daily. 07/31/17  Yes Chrystal, Eulas Post, MD  ondansetron (ZOFRAN) 8 MG tablet Take 1 tablet (8 mg total) 2 (two) times daily as needed by mouth. 07/01/17  Yes Finnegan, Kathlene November, MD  pravastatin (PRAVACHOL) 10 MG tablet Take 10 mg by mouth daily.   Yes [provider]  sucralfate (CARAFATE) 1 g tablet Take 1 tablet (1 g total) by mouth 3 (three)  times daily. Dissolve in 2-3 tbsp warm water, swish and swallow. Patient taking differently: Take 1 g by mouth 2 (two) times daily. Dissolve in 2-3 tbsp warm water, swish and swallow. 07/31/17  Yes Chrystal, Eulas Post, MD  prochlorperazine (COMPAZINE) 10 MG tablet Take 1 tablet (10 mg total) every 6 (six) hours as needed by mouth (Nausea or vomiting). 07/01/17   [name], MD   Dg Ankle 2 Views Left  Result Date: 08/07/2017 CLINICAL DATA:  Post reduction trimalleolar left ankle fracture-dislocation. EXAM: LEFT ANKLE - 2 VIEW 2:17 p.m.:  COMPARISON:  Left ankle x-rays earlier today at 11:04 a.m. FINDINGS: Anatomic alignment of the ankle mortise post reduction. Significant improvement in alignment of the trimalleolar fractures post reduction. IMPRESSION: 1. Anatomic alignment of the ankle joint post reduction. 2. Significant improvement in alignment of the trimalleolar fractures post reduction. Electronically Signed   By: Evangeline Dakin M.D.   On: 08/07/2017 14:35   Dg Ankle Complete Left  Result Date: 08/07/2017 CLINICAL DATA:  Left ankle pain and swelling after fall today. EXAM: LEFT ANKLE COMPLETE - 3+ VIEW COMPARISON:  None. FINDINGS: There is severe posterior dislocation of talus relative to distal tibia, with severely displaced fractures involving the distal fibula and posterior malleolus. No definite soft tissue abnormality is noted. IMPRESSION: Severe posterior dislocation of talus relative to distal tibia, with severely displaced fractures of the distal fibula and posterior malleolus. Electronically Signed   By: Marijo Conception, M.D.   On: 08/07/2017 11:38   Dg Ankle Complete Right  Result Date: 08/07/2017 CLINICAL DATA:  76 year old who fell while getting out of her car earlier today after receiving radiation therapy for head neck cancer (squamous cell carcinoma), sustaining a right ankle injury. Pain and swelling. Initial encounter. EXAM: RIGHT ANKLE - COMPLETE 3+ VIEW COMPARISON:  None. FINDINGS: Avulsion fracture fragment between the lateral malleolus and the lateral talus, which I believe arises from the talus. Small avulsion fracture involving the medial malleolus. No fractures elsewhere. Ankle mortise intact with well-preserved joint space. Osseous demineralization. Joint effusion/hemarthrosis. Diffuse soft tissue swelling, worst laterally. IMPRESSION: 1. Avulsion fracture involving the lateral aspect of the talus at the insertion of the talofibular ligament. 2. Avulsion fracture involving the medial malleolus. 3. Ankle  mortise intact.  No evidence of subluxation. 4. Osseous demineralization. Electronically Signed   By: Evangeline Dakin M.D.   On: 08/07/2017 11:39    Positive ROS: All other systems have been reviewed and were otherwise negative with the exception of those mentioned in the HPI and as above.  Physical Exam: General: Well developed, well nourished female seen in no acute distress. HEENT: Atraumatic and normocephalic. Sclera are clear. Extraocular motion is intact. Oropharynx is clear with moist mucosa. Neck: Supple, nontender, good range of motion. No JVD or carotid bruits. Lungs: Clear to auscultation bilaterally. Cardiovascular: Regular rate and rhythm with normal S1 and S2. No murmurs. No gallops or rubs. Pedal pulses are palpable bilaterally. Homans test is negative bilaterally. No significant pretibial or ankle edema. Abdomen: Soft, nontender, and nondistended. Bowel sounds are present. Skin: No lesions in the area of chief complaint Neurologic: Awake, alert, and oriented. Sensory function is grossly intact. Motor strength is felt to be 5 over 5 bilaterally. No clonus or tremor. Good motor coordination. Lymphatic: No axillary or cervical lymphadenopathy  MUSCULOSKELETAL:  Right ankle: The skin is intact.  Minimal swelling is appreciated.  Mild tenderness is noted to palpation along the distal aspect of the medial and lateral malleoli.  Ankle drawer test is negative.  No gross instability is appreciated.  Good capillary refill is noted. Left ankle: Obvious deformity to the left ankle with moderate swelling.  The foot is externally rotated.  Good capillary refill is noted.  There is significant tenderness to palpation about the ankle joint with some crepitance appreciated laterally.  Marked guarding was noted with any attempted range of motion of the ankle.  Assessment: Avulsion fractures to the right ankle Left trimalleolar ankle fracture with posterior dislocation  Plan: The findings were  discussed in detail with the patient and her husband.  I recommended closed reduction of the ankle in the Emergency Department and placement of this splint with plans for subsequent open reduction and internal fixation.  The patient and her husband expressed understanding of these recommendations and were in agreement.  IV Versed was administered as per Dr. Corky Downs to allow for some relaxation.  With the knee flexed, axial traction was applied to the foot and ankle and gentle internal rotation was performed.  Marked improvement of the clinical alignment was noted.  Postoperative radiographs of the ankle demonstrated reduction of the posterior dislocation.  The findings were discussed in detail with the patient. The usual perioperative course was discussed. The risks and benefits of surgical intervention were reviewed. The patient expressed understanding of the risks and benefits and agreed with plans for surgical intervention. The surgical site was signed as per the "right site surgery" protocol.   James P. Holley Bouche M.D.

## 2017-08-07 NOTE — H&P (Signed)
[facility] NAME: Dana Perez    MR#:  [id number]  DATE OF BIRTH:  13-Apr-1941  DATE OF ADMISSION:  08/07/2017  PRIMARY CARE PHYSICIAN: Glendon Axe, MD   REQUESTING/REFERRING PHYSICIAN: Lavonia Drafts, MD  CHIEF COMPLAINT:   Chief Complaint  Patient presents with  . Weakness   Weakness and fall today. HISTORY OF PRESENT ILLNESS:  Dana Perez  is a 76 y.o. female with a known history of stage IV head and neck cancer receiving radiation this morning.  When he got home he feels very weak and fell out of the car injuring both ankles.  She denies any headache or dizziness, no loss of consciousness or incontinence.  The patient complains of bilateral foot pain.  X-rays show bilateral ankle fractures.  Dr. Marry Guan got reduction and splint for the patient in the ED.  He plans to do left ankle surgery tomorrow.  PAST MEDICAL HISTORY:   Past Medical History:  Diagnosis Date  . Adenocarcinoma of colon ([place])   . Benign neoplasm of ascending colon   . Essential hypertension 06/13/2014  . Goals of care, counseling/discussion 07/14/2017  . HLD (hyperlipidemia) 06/13/2014  . Hypercholesteremia   . Hyperlipidemia   . Multiple thyroid nodules 01/22/2012  . Osteoporosis   . Polycythemia, secondary 12/26/2014  . Pure hypercholesterolemia 03/15/2015  . Thyroid nodule     PAST SURGICAL HISTORY:   Past Surgical History:  Procedure Laterality Date  . ABDOMINAL HYSTERECTOMY    . COLONOSCOPY WITH PROPOFOL N/A 07/02/2017   Procedure: COLONOSCOPY WITH PROPOFOL;  Surgeon: Lucilla Lame, MD;  Location: [facility];  Service: Endoscopy;  Laterality: N/A;  . POLYPECTOMY N/A 07/02/2017   Procedure: POLYPECTOMY;  Surgeon: Lucilla Lame, MD;  Location: [facility];  Service: Endoscopy;  Laterality: N/A;  . WRIST FRACTURE SURGERY  08/2009   badly broken    SOCIAL HISTORY:   Social History   Tobacco Use  . Smoking status:  Current Every Day Smoker    Packs/day: 0.25    Types: Cigarettes  . Smokeless tobacco: Never Used  . Tobacco comment: occasional smoker  Substance Use Topics  . Alcohol use: No    FAMILY HISTORY:   Family History  Problem Relation Age of Onset  . Heart disease Mother   . Diabetes Father     DRUG ALLERGIES:   Allergies  Allergen Reactions  . Other Other (See Comments) and Rash    TDAP    REVIEW OF SYSTEMS:   Review of Systems  Constitutional: Positive for malaise/fatigue. Negative for chills and fever.  HENT: Negative for sore throat.   Eyes: Negative for blurred vision and double vision.  Respiratory: Negative for cough, hemoptysis, shortness of breath, wheezing and stridor.   Cardiovascular: Negative for chest pain, palpitations, orthopnea and leg swelling.  Gastrointestinal: Negative for abdominal pain, blood in stool, diarrhea, melena, nausea and vomiting.  Genitourinary: Negative for dysuria, flank pain and hematuria.  Musculoskeletal: Positive for falls and joint pain. Negative for back pain.  Skin: Negative for rash.  Neurological: Positive for weakness. Negative for dizziness, sensory change, focal weakness, seizures, loss of consciousness and headaches.  Endo/Heme/Allergies: Negative for polydipsia.  Psychiatric/Behavioral: Negative for depression. The patient is not nervous/anxious.     MEDICATIONS AT HOME:   Prior to Admission medications   Medication Sig Start Date End Date Taking? Authorizing Provider  amLODipine (NORVASC) 10 MG tablet Take 1 tablet by mouth daily. 07/29/17  Yes  [provider]  dexamethasone (DECADRON) 4 MG tablet Take 1 tablet (4 mg total) by mouth daily. 07/31/17  Yes Chrystal, Eulas Post, MD  ondansetron (ZOFRAN) 8 MG tablet Take 1 tablet (8 mg total) 2 (two) times daily as needed by mouth. 07/01/17  Yes Finnegan, Kathlene November, MD  pravastatin (PRAVACHOL) 10 MG tablet Take 10 mg by mouth daily.   Yes [provider]    sucralfate (CARAFATE) 1 g tablet Take 1 tablet (1 g total) by mouth 3 (three) times daily. Dissolve in 2-3 tbsp warm water, swish and swallow. Patient taking differently: Take 1 g by mouth 2 (two) times daily. Dissolve in 2-3 tbsp warm water, swish and swallow. 07/31/17  Yes Chrystal, Eulas Post, MD  prochlorperazine (COMPAZINE) 10 MG tablet Take 1 tablet (10 mg total) every 6 (six) hours as needed by mouth (Nausea or vomiting). 07/01/17   [name], MD      VITAL SIGNS:  Blood pressure 132/77, pulse (!) 48, temperature 98.3 F (36.8 C), temperature source Oral, resp. rate 15, height 5\' 4"  (1.626 m), weight 118 lb (53.5 kg), SpO2 96 %.  PHYSICAL EXAMINATION:  Physical Exam  GENERAL:  76 y.o.-year-old patient lying in the bed with no acute distress.  EYES: Pupils equal, round, reactive to light and accommodation. No scleral icterus. Extraocular muscles intact.  HEENT: Head atraumatic, normocephalic. Oropharynx and nasopharynx clear.  NECK:  Supple, no jugular venous distention. No thyroid enlargement, no tenderness.  LUNGS: Normal breath sounds bilaterally, no wheezing, rales,rhonchi or crepitation. No use of accessory muscles of respiration.  CARDIOVASCULAR: S1, S2 normal. No murmurs, rubs, or gallops.  ABDOMEN: Soft, nontender, nondistended. Bowel sounds present. No organomegaly or mass.  EXTREMITIES: No pedal edema, cyanosis, or clubbing.  Bilateral foot in splints and dressing. NEUROLOGIC: Cranial nerves II through XII are intact. Muscle strength 5/5 in upper extremities. Sensation intact. Gait not checked.  PSYCHIATRIC: The patient is alert and oriented x 3.  SKIN: No obvious rash, lesion, or ulcer.   LABORATORY PANEL:   CBC Recent Labs  Lab 08/07/17 1214  WBC 10.9  HGB 12.8  HCT 38.3  PLT 176   ------------------------------------------------------------------------------------------------------------------  Chemistries  Recent Labs  Lab 08/07/17 1214  NA 137  K  3.5  CL 103  CO2 26  GLUCOSE 96  BUN 18  CREATININE 1.04*  CALCIUM 8.5*  AST 22  ALT 19  ALKPHOS 70  BILITOT 0.4   ------------------------------------------------------------------------------------------------------------------  Cardiac Enzymes Recent Labs  Lab 08/07/17 1214  TROPONINI 0.04*   ------------------------------------------------------------------------------------------------------------------  RADIOLOGY:  Dg Ankle 2 Views Left  Result Date: 08/07/2017 CLINICAL DATA:  Post reduction trimalleolar left ankle fracture-dislocation. EXAM: LEFT ANKLE - 2 VIEW 2:17 p.m.: COMPARISON:  Left ankle x-rays earlier today at 11:04 a.m. FINDINGS: Anatomic alignment of the ankle mortise post reduction. Significant improvement in alignment of the trimalleolar fractures post reduction. IMPRESSION: 1. Anatomic alignment of the ankle joint post reduction. 2. Significant improvement in alignment of the trimalleolar fractures post reduction. Electronically Signed   By: Evangeline Dakin M.D.   On: 08/07/2017 14:35   Dg Ankle Complete Left  Result Date: 08/07/2017 CLINICAL DATA:  Left ankle pain and swelling after fall today. EXAM: LEFT ANKLE COMPLETE - 3+ VIEW COMPARISON:  None. FINDINGS: There is severe posterior dislocation of talus relative to distal tibia, with severely displaced fractures involving the distal fibula and posterior malleolus. No definite soft tissue abnormality is noted. IMPRESSION: Severe posterior dislocation of talus relative to distal tibia, with  severely displaced fractures of the distal fibula and posterior malleolus. Electronically Signed   By: Marijo Conception, M.D.   On: 08/07/2017 11:38   Dg Ankle Complete Right  Result Date: 08/07/2017 CLINICAL DATA:  76 year old who fell while getting out of her car earlier today after receiving radiation therapy for head neck cancer (squamous cell carcinoma), sustaining a right ankle injury. Pain and swelling. Initial  encounter. EXAM: RIGHT ANKLE - COMPLETE 3+ VIEW COMPARISON:  None. FINDINGS: Avulsion fracture fragment between the lateral malleolus and the lateral talus, which I believe arises from the talus. Small avulsion fracture involving the medial malleolus. No fractures elsewhere. Ankle mortise intact with well-preserved joint space. Osseous demineralization. Joint effusion/hemarthrosis. Diffuse soft tissue swelling, worst laterally. IMPRESSION: 1. Avulsion fracture involving the lateral aspect of the talus at the insertion of the talofibular ligament. 2. Avulsion fracture involving the medial malleolus. 3. Ankle mortise intact.  No evidence of subluxation. 4. Osseous demineralization. Electronically Signed   By: Evangeline Dakin M.D.   On: 08/07/2017 11:39      IMPRESSION AND PLAN:   Bilateral ankle fractures. The patient will be admitted to medical floor. N.p.o. after midnight for left ankle surgery per Dr. Marry Guan. DVT prophylaxis with SCD, no anticoagulation due to surgery. PT, OT evaluation after surgery.  Hypertension.  Continue home Norvasc.  Stage IV head and neck cancer.  Continue dexamethasone.  Follow-up Dr. Grayland Ormond as outpatient.  Tobacco abuse.  Smoking cessation was counseled for 3-4 minutes.  Discussed with Dr. Marry Guan. All the records are reviewed and case discussed with ED provider. Management plans discussed with the patient, her husband and they are in agreement.  CODE STATUS: Full code  TOTAL TIME TAKING CARE OF THIS PATIENT: 57 minutes.    Demetrios Loll M.D on 08/07/2017 at 3:47 PM  Between 7am to 6pm - Pager - [phone]  After 6pm go to [email or web address] - Proofreader  [facility]  Office  [phone]  CC: Primary care physician; Glendon Axe, MD   Note: This dictation was prepared with Dragon dictation along with smaller phrase technology. Any transcriptional errors that result from this process are unin

## 2017-08-07 NOTE — NC FL2 (Signed)
[place] LEVEL OF CARE SCREENING TOOL     IDENTIFICATION  Patient Name: Dana Perez Birthdate: Jan 02, 1941 Sex: female Admission Date (Current Location): 08/07/2017  [place] and [place] Number:  Engineering geologist and Address:  [facility], [address] [place], [place] [address]      Provider Number: [id number]  Attending Physician Name and Address:  Demetrios Loll, MD  Relative Name and Phone Number:       Current Level of Care: Hospital Recommended Level of Care: [place] Prior Approval Number:    Date Approved/Denied:   PASRR Number: [id number])  Discharge Plan: SNF    Current Diagnoses: Patient Active Problem List   Diagnosis Date Noted  . Closed left ankle fracture 08/07/2017  . Goals of care, counseling/discussion 07/14/2017  . Adenocarcinoma of colon ([place])   . Benign neoplasm of ascending colon   . Squamous cell carcinoma of head and neck ([place]) 07/01/2017  . Erythrocytosis 07/17/2015  . Pure hypercholesterolemia 03/15/2015  . Polycythemia, secondary 12/26/2014  . HLD (hyperlipidemia) 06/13/2014  . Essential (primary) hypertension 06/13/2014  . Hyperlipidemia, unspecified 06/13/2014  . Essential hypertension 06/13/2014  . Multiple thyroid nodules 01/22/2012    Orientation RESPIRATION BLADDER Height & Weight     Self, Time, Situation, Place  Normal Continent Weight: 118 lb (53.5 kg) Height:  5\' 4"  (162.6 cm)  BEHAVIORAL SYMPTOMS/MOOD NEUROLOGICAL BOWEL NUTRITION STATUS      Continent Diet(Regular Diet. )  AMBULATORY STATUS COMMUNICATION OF NEEDS Skin   Extensive Assist Verbally Surgical wounds                       Personal Care Assistance Level of Assistance  Bathing, Feeding, Dressing Bathing Assistance: Limited assistance Feeding assistance: Independent Dressing Assistance: Limited assistance     Functional Limitations Info  Sight, Hearing, Speech Sight Info:  Adequate Hearing Info: Adequate Speech Info: Adequate    SPECIAL CARE FACTORS FREQUENCY  PT (By licensed PT), OT (By licensed OT)     PT Frequency: (5) OT Frequency: (5)            Contractures      Additional Factors Info  Code Status, Allergies Code Status Info: (not on file. ) Allergies Info: (TDAP)           Current Medications (08/07/2017):  This is the current hospital active medication list No current facility-administered medications for this encounter.    Facility-Administered Medications Ordered in Other Encounters  Medication Dose Route Frequency Provider Last Rate Last Dose  . heparin lock flush 100 unit/mL  250 Units Intracatheter PRN Leia Alf, MD      . heparin lock flush 100 unit/mL  500 Units Intracatheter PRN Leia Alf, MD      . sodium chloride 0.9 % injection 10 mL  10 mL Intracatheter PRN Leia Alf, MD      . sodium chloride 0.9 % injection 10 mL  10 mL Intracatheter PRN Ma Hillock, Sandeep, MD      . sodium chloride 0.9 % injection 10 mL  10 mL Intracatheter PRN Ma Hillock, Sandeep, MD      . sodium chloride 0.9 % injection 10 mL  10 mL Intracatheter PRN Leia Alf, MD         Discharge Medications: Please see discharge summary for a list of discharge medications.  Relevant Imaging Results:  Relevant Lab Results:   Additional Information (SSN: [id number])  Aquiles Ruffini, Veronia Beets, LCSW

## 2017-08-07 NOTE — Clinical Social Work Note (Signed)
Clinical Social Work Assessment  Patient Details  Name: Dana Perez MRN: [id number] Date of Birth: 1941/08/09  Date of referral:  08/07/17               Reason for consult:  Facility Placement                Permission sought to share information with:  Chartered certified accountant granted to share information::  Yes, Verbal Permission Granted  Name::      [place]::   [place]   Relationship::     Contact Information:     Housing/Transportation Living arrangements for the past 2 months:  Single Family Home Source of Information:  Spouse Patient Interpreter Needed:  None Criminal Activity/Legal Involvement Pertinent to Current Situation/Hospitalization:  No - Comment as needed Significant Relationships:  Adult Children, Spouse Lives with:  Spouse Do you feel safe going back to the place where you live?  Yes Need for family participation in patient care:  Yes (Comment)  Care giving concerns:  Patient lives in [place] the [place] side with her husband Edkar [phone].    Social Worker assessment / plan:  Holiday representative ([place]) received verbal consult from RN that patient will likely need SNF placement with bilateral ankle fractures. Surgery and PT are pending. CSW met with patient's husband Rusty Aus outside of patient's room to discuss D/C plan. Per husband patient [place] in [place]. Per husband he transports her to radiation daily at [facility] cancer center and chemo every Tuesday. Per husband chemo will be put on hold until patient heals from the ankle fractures. Per husband radiation will start in 2 weeks if patient has the strength to do it. CSW explained SNF process and that Health Team will have to approve SNF. CSW also explained that radiation may be a barrier to SNF because a SNF can't transport patient daily to radiation. Husband verbalized his  and is agreeable to SNF search in [place]. FL2 complete and faxed out. Health Team case manager is aware of above. CSW will continue to follow and assist as needed.   Employment status:  Disabled (Comment on whether or not currently receiving Disability) Insurance information:  Managed Medicare PT Recommendations:  Not assessed at this time Information / Referral to community resources:  [place]  Patient/Family's Response to care:  Patient's husband is agreeable to [facility] in [place].   Patient/Family's Understanding of and Emotional Response to Diagnosis, Current Treatment, and Prognosis:  Patient's husband was very pleasant and thanked CSW for assistance.   Emotional Assessment Appearance:  Appears stated age Attitude/Demeanor/Rapport:    Affect (typically observed):  Pleasant Orientation:  Oriented to Self, Oriented to Place, Fluctuating Orientation (Suspected and/or reported Sundowners) Alcohol / Substance use:  Not Applicable Psych involvement (Current and /or in the community):  No (Comment)  Discharge Needs  Concerns to be addressed:  Discharge Planning Concerns Readmission within the last 30 days:  No Current discharge risk:  Dependent with Mobility Barriers to Discharge:  Continued Medical Work up   [employer], Veronia Beets, LCSW 08/07/2017, 4:45 PM

## 2017-08-08 ENCOUNTER — Encounter
Admission: EM | Disposition: A | Discharge status: Skilled Nursing Facility | Payer: Self-pay | Source: Home / Self Care | Attending: Internal Medicine

## 2017-08-08 ENCOUNTER — Encounter: Payer: Self-pay | Admitting: Anesthesiology

## 2017-08-08 ENCOUNTER — Inpatient Hospital Stay: Payer: PPO | Admitting: Anesthesiology

## 2017-08-08 HISTORY — PX: ORIF ANKLE FRACTURE: SHX5408

## 2017-08-08 LAB — CBC
HEMATOCRIT: 31.8 % — AB (ref 35.0–47.0)
HEMOGLOBIN: 10.9 g/dL — AB (ref 12.0–16.0)
MCH: 30.5 pg (ref 26.0–34.0)
MCHC: 34.2 g/dL (ref 32.0–36.0)
MCV: 89.1 fL (ref 80.0–100.0)
Platelets: 142 10*3/uL — ABNORMAL LOW (ref 150–440)
RBC: 3.57 MIL/uL — ABNORMAL LOW (ref 3.80–5.20)
RDW: 12 % (ref 11.5–14.5)
WBC: 7.5 10*3/uL (ref 3.6–11.0)

## 2017-08-08 SURGERY — OPEN REDUCTION INTERNAL FIXATION (ORIF) ANKLE FRACTURE
Anesthesia: General | Laterality: Left | Wound class: Clean

## 2017-08-08 MED ORDER — BISACODYL 10 MG RE SUPP
10.0000 mg | Freq: Every day | RECTAL | Status: DC | PRN
  Administered 2017-08-10: 10 mg via RECTAL
  Filled 2017-08-08: qty 1

## 2017-08-08 MED ORDER — LACTATED RINGERS IV SOLN
INTRAVENOUS | Status: DC | PRN
  Administered 2017-08-08 (×2): via INTRAVENOUS

## 2017-08-08 MED ORDER — MORPHINE SULFATE (PF) 2 MG/ML IV SOLN: 2.0000 mg | INTRAVENOUS | Status: DC | PRN

## 2017-08-08 MED ORDER — ACETAMINOPHEN 10 MG/ML IV SOLN
1000.0000 mg | Freq: Four times a day (QID) | INTRAVENOUS | Status: DC
  Administered 2017-08-09 (×2): 1000 mg via INTRAVENOUS
  Filled 2017-08-08 (×2): qty 100

## 2017-08-08 MED ORDER — BUPIVACAINE HCL (PF) 0.25 % IJ SOLN
INTRAMUSCULAR | Status: AC
  Filled 2017-08-08: qty 30

## 2017-08-08 MED ORDER — FLEET ENEMA 7-19 GM/118ML RE ENEM: 1.0000 | ENEMA | Freq: Once | RECTAL | Status: DC | PRN

## 2017-08-08 MED ORDER — LIDOCAINE HCL 2 % EX GEL
CUTANEOUS | Status: AC
Start: 2017-08-08 — End: ?
  Filled 2017-08-08: qty 5

## 2017-08-08 MED ORDER — PHENYLEPHRINE HCL 10 MG/ML IJ SOLN
INTRAMUSCULAR | Status: DC | PRN
  Administered 2017-08-08: 200 ug via INTRAVENOUS

## 2017-08-08 MED ORDER — ONDANSETRON HCL 4 MG/2ML IJ SOLN
INTRAMUSCULAR | Status: AC
Start: 2017-08-08 — End: ?
  Filled 2017-08-08: qty 2

## 2017-08-08 MED ORDER — LIDOCAINE HCL (PF) 2 % IJ SOLN
INTRAMUSCULAR | Status: AC
  Filled 2017-08-08: qty 10

## 2017-08-08 MED ORDER — PHENOL 1.4 % MT LIQD
1.0000 | OROMUCOSAL | Status: DC | PRN
Start: 2017-08-08 — End: 2017-08-10
  Filled 2017-08-08: qty 177

## 2017-08-08 MED ORDER — ENOXAPARIN SODIUM 40 MG/0.4ML ~~LOC~~ SOLN
40.0000 mg | SUBCUTANEOUS | Status: DC
  Administered 2017-08-09 – 2017-08-10 (×2): 40 mg via SUBCUTANEOUS
  Filled 2017-08-08 (×2): qty 0.4

## 2017-08-08 MED ORDER — SODIUM CHLORIDE 0.9 % IV SOLN
INTRAVENOUS | Status: DC
  Administered 2017-08-08: 15:00:00 via INTRAVENOUS

## 2017-08-08 MED ORDER — NEOMYCIN-POLYMYXIN B GU 40-200000 IR SOLN
Status: DC | PRN
  Administered 2017-08-08: 2 mL

## 2017-08-08 MED ORDER — SODIUM CHLORIDE 0.9 % IV SOLN
INTRAVENOUS | Status: DC | PRN
  Administered 2017-08-08: 30 ug/min via INTRAVENOUS

## 2017-08-08 MED ORDER — LIDOCAINE HCL (CARDIAC) 20 MG/ML IV SOLN
INTRAVENOUS | Status: DC | PRN
  Administered 2017-08-08: 100 mg via INTRAVENOUS

## 2017-08-08 MED ORDER — MIDAZOLAM HCL 2 MG/2ML IJ SOLN
INTRAMUSCULAR | Status: AC
  Filled 2017-08-08: qty 2

## 2017-08-08 MED ORDER — METOCLOPRAMIDE HCL 10 MG PO TABS: 5.0000 mg | ORAL_TABLET | Freq: Three times a day (TID) | ORAL | Status: DC | PRN

## 2017-08-08 MED ORDER — DEXTROSE 5 % IV SOLN
Freq: Once | INTRAVENOUS | Status: DC
  Filled 2017-08-08: qty 100

## 2017-08-08 MED ORDER — PANTOPRAZOLE SODIUM 40 MG PO TBEC
40.0000 mg | DELAYED_RELEASE_TABLET | Freq: Two times a day (BID) | ORAL | Status: DC
Start: 2017-08-08 — End: 2017-08-10
  Administered 2017-08-08 – 2017-08-10 (×4): 40 mg via ORAL
  Filled 2017-08-08 (×4): qty 1

## 2017-08-08 MED ORDER — FENTANYL CITRATE (PF) 100 MCG/2ML IJ SOLN: 25.0000 ug | INTRAMUSCULAR | Status: DC | PRN

## 2017-08-08 MED ORDER — ONDANSETRON HCL 4 MG/2ML IJ SOLN: 4.0000 mg | Freq: Four times a day (QID) | INTRAMUSCULAR | Status: DC | PRN

## 2017-08-08 MED ORDER — ACETAMINOPHEN 650 MG RE SUPP: 650.0000 mg | Freq: Four times a day (QID) | RECTAL | Status: DC | PRN

## 2017-08-08 MED ORDER — FENTANYL CITRATE (PF) 100 MCG/2ML IJ SOLN
INTRAMUSCULAR | Status: AC
  Filled 2017-08-08: qty 2

## 2017-08-08 MED ORDER — CEFAZOLIN SODIUM-DEXTROSE 2-4 GM/100ML-% IV SOLN: 2.0000 g | Freq: Four times a day (QID) | INTRAVENOUS | Status: DC

## 2017-08-08 MED ORDER — FERROUS SULFATE 325 (65 FE) MG PO TABS
325.0000 mg | ORAL_TABLET | Freq: Two times a day (BID) | ORAL | Status: DC
  Administered 2017-08-09 – 2017-08-10 (×3): 325 mg via ORAL
  Filled 2017-08-08 (×3): qty 1

## 2017-08-08 MED ORDER — METOCLOPRAMIDE HCL 10 MG PO TABS
10.0000 mg | ORAL_TABLET | Freq: Three times a day (TID) | ORAL | Status: DC
  Administered 2017-08-08 – 2017-08-10 (×7): 10 mg via ORAL
  Filled 2017-08-08 (×7): qty 1

## 2017-08-08 MED ORDER — ONDANSETRON HCL 4 MG PO TABS: 4.0000 mg | ORAL_TABLET | Freq: Four times a day (QID) | ORAL | Status: DC | PRN

## 2017-08-08 MED ORDER — FENTANYL CITRATE (PF) 100 MCG/2ML IJ SOLN
INTRAMUSCULAR | Status: DC | PRN
  Administered 2017-08-08: 50 ug via INTRAVENOUS
  Administered 2017-08-08: 25 ug via INTRAVENOUS
  Administered 2017-08-08: 50 ug via INTRAVENOUS
  Administered 2017-08-08: 25 ug via INTRAVENOUS

## 2017-08-08 MED ORDER — ONDANSETRON HCL 4 MG/2ML IJ SOLN: 4.0000 mg | Freq: Once | INTRAMUSCULAR | Status: DC | PRN

## 2017-08-08 MED ORDER — MENTHOL 3 MG MT LOZG
1.0000 | LOZENGE | OROMUCOSAL | Status: DC | PRN
  Filled 2017-08-08: qty 9

## 2017-08-08 MED ORDER — PROPOFOL 10 MG/ML IV BOLUS
INTRAVENOUS | Status: AC
  Filled 2017-08-08: qty 20

## 2017-08-08 MED ORDER — PROMETHAZINE HCL 25 MG/ML IJ SOLN
12.5000 mg | INTRAMUSCULAR | Status: AC
  Administered 2017-08-08: 12.5 mg via INTRAVENOUS
  Filled 2017-08-08: qty 1

## 2017-08-08 MED ORDER — SUCCINYLCHOLINE CHLORIDE 20 MG/ML IJ SOLN
INTRAMUSCULAR | Status: AC
  Filled 2017-08-08: qty 1

## 2017-08-08 MED ORDER — ACETAMINOPHEN 10 MG/ML IV SOLN
INTRAVENOUS | Status: DC | PRN
  Administered 2017-08-08: 1000 mg via INTRAVENOUS

## 2017-08-08 MED ORDER — SUCCINYLCHOLINE CHLORIDE 20 MG/ML IJ SOLN
INTRAMUSCULAR | Status: DC | PRN
  Administered 2017-08-08: 80 mg via INTRAVENOUS

## 2017-08-08 MED ORDER — DEXAMETHASONE SODIUM PHOSPHATE 10 MG/ML IJ SOLN
INTRAMUSCULAR | Status: DC | PRN
  Administered 2017-08-08: 10 mg via INTRAVENOUS

## 2017-08-08 MED ORDER — BUPIVACAINE HCL 0.25 % IJ SOLN
INTRAMUSCULAR | Status: DC | PRN
  Administered 2017-08-08: 10 mL

## 2017-08-08 MED ORDER — ACETAMINOPHEN 10 MG/ML IV SOLN
INTRAVENOUS | Status: AC
  Filled 2017-08-08: qty 100

## 2017-08-08 MED ORDER — METOCLOPRAMIDE HCL 5 MG/ML IJ SOLN
5.0000 mg | Freq: Three times a day (TID) | INTRAMUSCULAR | Status: DC | PRN
  Administered 2017-08-08: 10 mg via INTRAVENOUS
  Filled 2017-08-08: qty 2

## 2017-08-08 MED ORDER — TRAMADOL HCL 50 MG PO TABS: 50.0000 mg | ORAL_TABLET | ORAL | Status: DC | PRN

## 2017-08-08 MED ORDER — SENNOSIDES-DOCUSATE SODIUM 8.6-50 MG PO TABS
1.0000 | ORAL_TABLET | Freq: Two times a day (BID) | ORAL | Status: DC
  Administered 2017-08-08 – 2017-08-10 (×4): 1 via ORAL
  Filled 2017-08-08 (×4): qty 1

## 2017-08-08 MED ORDER — PHENYLEPHRINE HCL 10 MG/ML IJ SOLN
INTRAMUSCULAR | Status: AC
  Filled 2017-08-08: qty 1

## 2017-08-08 MED ORDER — ACETAMINOPHEN 325 MG PO TABS
650.0000 mg | ORAL_TABLET | Freq: Four times a day (QID) | ORAL | Status: DC | PRN
  Administered 2017-08-09: 650 mg via ORAL
  Filled 2017-08-08: qty 2

## 2017-08-08 MED ORDER — PROPOFOL 10 MG/ML IV BOLUS
INTRAVENOUS | Status: DC | PRN
  Administered 2017-08-08: 70 mg via INTRAVENOUS
  Administered 2017-08-08: 50 mg via INTRAVENOUS

## 2017-08-08 MED ORDER — DEXAMETHASONE SODIUM PHOSPHATE 10 MG/ML IJ SOLN
INTRAMUSCULAR | Status: AC
  Filled 2017-08-08: qty 1

## 2017-08-08 MED ORDER — NEOMYCIN-POLYMYXIN B GU 40-200000 IR SOLN
Status: AC
  Filled 2017-08-08: qty 2

## 2017-08-08 MED ORDER — DEXTROSE 5 % IV SOLN
2.0000 g | Freq: Four times a day (QID) | INTRAVENOUS | Status: DC
  Administered 2017-08-08 – 2017-08-09 (×3): 2 g via INTRAVENOUS
  Filled 2017-08-08 (×4): qty 2000

## 2017-08-08 MED ORDER — OXYCODONE HCL 5 MG PO TABS
5.0000 mg | ORAL_TABLET | ORAL | Status: DC | PRN
  Administered 2017-08-09: 5 mg via ORAL
  Filled 2017-08-08: qty 1

## 2017-08-08 MED ORDER — MAGNESIUM HYDROXIDE 400 MG/5ML PO SUSP
30.0000 mL | Freq: Every day | ORAL | Status: DC | PRN
  Administered 2017-08-09: 30 mL via ORAL
  Filled 2017-08-08: qty 30

## 2017-08-08 MED ORDER — ONDANSETRON HCL 4 MG/2ML IJ SOLN
INTRAMUSCULAR | Status: DC | PRN
  Administered 2017-08-08: 4 mg via INTRAVENOUS

## 2017-08-08 SURGICAL SUPPLY — 50 items
BANDAGE ACE 4X5 VEL STRL LF (GAUZE/BANDAGES/DRESSINGS) ×2 IMPLANT
BANDAGE ACE 6X5 VEL STRL LF (GAUZE/BANDAGES/DRESSINGS) ×2 IMPLANT
BIT DRILL 2.5X110 QC LCP DISP (BIT) ×2 IMPLANT
BNDG COHESIVE 4X5 TAN STRL (GAUZE/BANDAGES/DRESSINGS) ×2 IMPLANT
BNDG ESMARK 6X12 TAN STRL LF (GAUZE/BANDAGES/DRESSINGS) ×2 IMPLANT
COOLER POLAR GLACIER W/PUMP (MISCELLANEOUS) ×4 IMPLANT
CUFF TOURN 24 STER (MISCELLANEOUS) ×2 IMPLANT
CUFF TOURN 30 STER DUAL PORT (MISCELLANEOUS) IMPLANT
DRAPE FLUOR MINI C-ARM 54X84 (DRAPES) ×2 IMPLANT
DRSG DERMACEA 8X12 NADH (GAUZE/BANDAGES/DRESSINGS) ×2 IMPLANT
DRSG TELFA 3X8 NADH (GAUZE/BANDAGES/DRESSINGS) ×2 IMPLANT
DURAPREP 26ML APPLICATOR (WOUND CARE) ×2 IMPLANT
ELECT CAUTERY BLADE 6.4 (BLADE) ×2 IMPLANT
ELECT REM PT RETURN 9FT ADLT (ELECTROSURGICAL) ×2
ELECTRODE REM PT RTRN 9FT ADLT (ELECTROSURGICAL) ×1 IMPLANT
GAUZE SPONGE 4X4 12PLY STRL (GAUZE/BANDAGES/DRESSINGS) ×2 IMPLANT
GLOVE BIOGEL M STRL SZ7.5 (GLOVE) ×2 IMPLANT
GLOVE INDICATOR 8.0 STRL GRN (GLOVE) ×2 IMPLANT
GOWN STRL REUS W/ TWL LRG LVL3 (GOWN DISPOSABLE) ×2 IMPLANT
GOWN STRL REUS W/TWL LRG LVL3 (GOWN DISPOSABLE) ×2
GUIDEWIRE THREADED 150MM (WIRE) ×2 IMPLANT
KIT RM TURNOVER STRD PROC AR (KITS) ×2 IMPLANT
LABEL OR SOLS (LABEL) ×2 IMPLANT
PACK EXTREMITY ARMC (MISCELLANEOUS) ×2 IMPLANT
PAD ABD DERMACEA PRESS 5X9 (GAUZE/BANDAGES/DRESSINGS) ×6 IMPLANT
PAD CAST CTTN 4X4 STRL (SOFTGOODS) ×3 IMPLANT
PAD PREP 24X41 OB/GYN DISP (PERSONAL CARE ITEMS) ×2 IMPLANT
PAD WRAPON POLAR ANKLE (MISCELLANEOUS) ×2 IMPLANT
PADDING CAST COTTON 4X4 STRL (SOFTGOODS) ×3
PLATE LCP 3.5 1/3 TUB 6HX69 (Plate) ×2 IMPLANT
SCREW CANC FT/18 4.0 (Screw) ×2 IMPLANT
SCREW CANN S THRD/38 4.0 (Screw) ×2 IMPLANT
SCREW CORTEX 3.5 10MM (Screw) ×1 IMPLANT
SCREW CORTEX 3.5 12MM (Screw) ×1 IMPLANT
SCREW CORTEX 3.5 14MM (Screw) ×2 IMPLANT
SCREW CORTEX 3.5 20MM (Screw) ×1 IMPLANT
SCREW LOCK CORT ST 3.5X10 (Screw) ×1 IMPLANT
SCREW LOCK CORT ST 3.5X12 (Screw) ×1 IMPLANT
SCREW LOCK CORT ST 3.5X14 (Screw) ×2 IMPLANT
SCREW LOCK CORT ST 3.5X20 (Screw) ×1 IMPLANT
SCREW SHORT THREAD 4.0X40 (Screw) ×2 IMPLANT
SLEEVE SCD COMPRESS THIGH MED (MISCELLANEOUS) ×2 IMPLANT
SPLINT CAST 1 STEP 5X30 WHT (MISCELLANEOUS) ×2 IMPLANT
SPONGE LAP 18X18 5 PK (GAUZE/BANDAGES/DRESSINGS) ×2 IMPLANT
STAPLER SKIN PROX 35W (STAPLE) ×2 IMPLANT
STOCKINETTE STRL 6IN 960660 (GAUZE/BANDAGES/DRESSINGS) ×2 IMPLANT
SUT VIC AB 0 CT1 36 (SUTURE) ×4 IMPLANT
SUT VIC AB 2-0 SH 27 (SUTURE) ×1
SUT VIC AB 2-0 SH 27XBRD (SUTURE) ×1 IMPLANT
WRAPON POLAR PAD ANKLE (MISCELLANEOUS) ×4

## 2017-08-08 NOTE — Anesthesia Procedure Notes (Signed)
Procedure Name: Intubation Date/Time: 08/08/2017 10:47 AM Performed by: Clinton Sawyer, CRNA Pre-anesthesia Checklist: Patient identified, Emergency Drugs available, Suction available, Patient being monitored and Timeout performed Patient Re-evaluated:Patient Re-evaluated prior to induction Oxygen Delivery Method: Circle system utilized Preoxygenation: Pre-oxygenation with 100% oxygen Induction Type: IV induction, Rapid sequence and Cricoid Pressure applied Laryngoscope Size: Mac and 3 Grade View: Grade I Tube type: Oral Tube size: 6.5 mm Number of attempts: 1 Airway Equipment and Method: Stylet Placement Confirmation: ETT inserted through vocal cords under direct vision,  positive ETCO2,  CO2 detector and breath sounds checked- equal and bilateral Secured at: 21 cm Tube secured with: Tape Dental Injury: Teeth and Oropharynx as per pre-operative assessment

## 2017-08-08 NOTE — Anesthesia Preprocedure Evaluation (Addendum)
Anesthesia Evaluation  Patient identified by MRN, date of birth, ID band Patient awake  General Assessment Comment:HEENT malignancy  Reviewed: Allergy & Precautions, NPO status , Patient's Chart, lab work & pertinent test results, reviewed documented beta blocker date and time   Airway Mallampati: III  TM Distance: >3 FB Neck ROM: Full  Mouth opening: Limited Mouth Opening  Dental no notable dental hx.    Pulmonary Current Smoker,    Pulmonary exam normal breath sounds clear to auscultation       Cardiovascular hypertension, Normal cardiovascular exam Rhythm:Regular Rate:Normal     Neuro/Psych negative neurological ROS  negative psych ROS   GI/Hepatic Neg liver ROS, Colon lesion on PET   Endo/Other  negative endocrine ROS  Renal/GU negative Renal ROS  negative genitourinary   Musculoskeletal   Abdominal Normal abdominal exam  (+)  Abdomen: soft.    Peds  Hematology polycythemia   Anesthesia Other Findings Past Medical History: No date: Adenocarcinoma of colon ([place]) No date: Benign neoplasm of ascending colon 06/13/2014: Essential hypertension 07/14/2017: Goals of care, counseling/discussion 06/13/2014: HLD (hyperlipidemia) No date: Hypercholesteremia No date: Hyperlipidemia 01/22/2012: Multiple thyroid nodules   No date: Osteoporosis 12/26/2014: Polycythemia, secondary 03/15/2015: Pure hypercholesterolemia No date: Thyroid nodule  Stage 4 head and neck CA  Reproductive/Obstetrics                            Anesthesia Physical  Anesthesia Plan  ASA: III  Anesthesia Plan: General   Post-op Pain Management:    Induction: Intravenous  PONV Risk Score and Plan:   Airway Management Planned: Nasal Cannula and Natural Airway  Additional Equipment: None  Intra-op Plan:   Post-operative Plan:   Informed Consent: I have reviewed the patients History and Physical, chart, labs  and discussed the procedure including the risks, benefits and alternatives for the proposed anesthesia with the patient or authorized representative who has indicated his/her understanding and acceptance.     Plan Discussed with: CRNA, Anesthesiologist and Surgeon  Anesthesia Plan Comments:         Anesthesia Quick Evaluation

## 2017-08-08 NOTE — Progress Notes (Signed)
[facility] NAME: Dana Perez    MR#:  [id number]  DATE OF BIRTH:  [birth year]  SUBJECTIVE:  CHIEF COMPLAINT:   Chief Complaint  Patient presents with  . Weakness   Bilateral ankle pain. REVIEW OF SYSTEMS:  Review of Systems  Constitutional: Negative for chills, fever and malaise/fatigue.  HENT: Negative for sore throat.   Eyes: Negative for blurred vision and double vision.  Respiratory: Negative for cough, hemoptysis, shortness of breath, wheezing and stridor.   Cardiovascular: Negative for chest pain, palpitations, orthopnea and leg swelling.  Gastrointestinal: Negative for abdominal pain, blood in stool, diarrhea, melena, nausea and vomiting.  Genitourinary: Negative for dysuria, flank pain and hematuria.  Musculoskeletal: Positive for joint pain. Negative for back pain.  Skin: Negative for rash.  Neurological: Negative for dizziness, sensory change, focal weakness, seizures, loss of consciousness, weakness and headaches.  Endo/Heme/Allergies: Negative for polydipsia.  Psychiatric/Behavioral: Negative for depression. The patient is not nervous/anxious.     DRUG ALLERGIES:   Allergies  Allergen Reactions  . Other Other (See Comments) and Rash    TDAP   VITALS:  Blood pressure (!) 124/53, pulse 70, temperature 99.2 F (37.3 C), resp. rate 11, height 5\' 4"  (1.626 m), weight 118 lb (53.5 kg), SpO2 99 %. PHYSICAL EXAMINATION:  Physical Exam  Constitutional: She is oriented to person, place, and time and well-developed, well-nourished, and in no distress.  HENT:  Head: Normocephalic.  Mouth/Throat: Oropharynx is clear and moist.  Eyes: Conjunctivae and EOM are normal. Pupils are equal, round, and reactive to light. No scleral icterus.  Neck: Normal range of motion. Neck supple. No JVD present. No tracheal deviation present.  Cardiovascular: Normal rate, regular rhythm and normal heart sounds. Exam reveals no  gallop.  No murmur heard. Pulmonary/Chest: Effort normal and breath sounds normal. No respiratory distress. She has no wheezes. She has no rales.  Abdominal: Soft. Bowel sounds are normal. She exhibits no distension. There is no tenderness. There is no rebound.  Musculoskeletal: She exhibits no edema or tenderness.  Bilateral foot in splints and dressing.  Neurological: She is alert and oriented to person, place, and time. No cranial nerve deficit.  Skin: No rash noted. No erythema.  Psychiatric: Affect normal.   LABORATORY PANEL:  Female CBC Recent Labs  Lab 08/08/17 0357  WBC 7.5  HGB 10.9*  HCT 31.8*  PLT 142*   ------------------------------------------------------------------------------------------------------------------ Chemistries  Recent Labs  Lab 08/07/17 1214  NA 137  K 3.5  CL 103  CO2 26  GLUCOSE 96  BUN 18  CREATININE 1.04*  CALCIUM 8.5*  AST 22  ALT 19  ALKPHOS 70  BILITOT 0.4   RADIOLOGY:  Dg Ankle 2 Views Left  Result Date: 08/07/2017 CLINICAL DATA:  Post reduction trimalleolar left ankle fracture-dislocation. EXAM: LEFT ANKLE - 2 VIEW 2:17 p.m.: COMPARISON:  Left ankle x-rays earlier today at 11:04 a.m. FINDINGS: Anatomic alignment of the ankle mortise post reduction. Significant improvement in alignment of the trimalleolar fractures post reduction. IMPRESSION: 1. Anatomic alignment of the ankle joint post reduction. 2. Significant improvement in alignment of the trimalleolar fractures post reduction. Electronically Signed   By: Evangeline Dakin M.D.   On: 08/07/2017 14:35   ASSESSMENT AND PLAN:    Bilateral ankle fractures. N.p.o. after midnight for left ankle surgery today per Dr. Marry Guan. DVT prophylaxis with SCD, no anticoagulation due to surgery. PT, OT evaluation after surgery.  Hypertension.  Continue home Norvasc.  Stage IV head and neck cancer.  Continue dexamethasone.  Follow-up Dr. Grayland Ormond as outpatient.  Tobacco abuse.  Smoking  cessation was counseled for 3-4 minutes.  Discussed with Dr. Marry Guan. All the records are reviewed and case discussed with Care Management/Social Worker. Management plans discussed with the patient, her hudsband and they are in agreement.  CODE STATUS: Full Code  TOTAL TIME TAKING CARE OF THIS PATIENT: 32 minutes.   More than 50% of the time was spent in counseling/coordination of care: YES  POSSIBLE D/C IN 2 DAYS, DEPENDING ON CLINICAL CONDITION.   Demetrios Loll M.D on 08/08/2017 at 1:24 PM  Between 7am to 6pm - Pager - [phone]  After 6pm go to [email or web address] - [place] [facility]

## 2017-08-08 NOTE — Progress Notes (Signed)
Left foot warm to touch, normal sensation, and pink in Color.

## 2017-08-08 NOTE — Anesthesia Post-op Follow-up Note (Signed)
Anesthesia QCDR form completed.        

## 2017-08-08 NOTE — Op Note (Signed)
OPERATIVE NOTE  DATE OF SURGERY:  08/08/2017  PATIENT NAME:  Dana Perez   DOB: June 09, 1941  MRN: [id number]  PRE-OPERATIVE DIAGNOSIS: Left trimalleolar ankle fracture  POST-OPERATIVE DIAGNOSIS:  Same  PROCEDURE:  Open reduction and internal fixation of the left trimalleolar ankle fracture (medial and lateral)  SURGEON:  Marciano Sequin. M.D.  ANESTHESIA: general  ESTIMATED BLOOD LOSS: Minimal  FLUIDS REPLACED: 900 mL of crystalloid  TOURNIQUET TIME: 68 minutes  DRAINS: None  IMPLANTS UTILIZED: Synthes 6 hole 1/3 tubular plate, 5 - 3.5 mm cortical screws, 1 - 4.0 mm fully threaded cancellous screws, 2 - 4.0 mm cannulated partially threaded cancellous screws  INDICATIONS FOR SURGERY: Dana Perez is a 76 y.o. year old female who sustained a left trimalleolar ankle fracture. After discussion of the risks and benefits of surgical intervention, the patient expressed understanding of the risks benefits and agree with plans for open reduction and internal fixation.   PROCEDURE IN DETAIL: The patient was brought into the operating room and after adequate general anesthesia, a tourniquet was placed on the patient's left thigh.The left foot and ankle were prepped with alcohol and Duraprep and draped in the usual sterile fashion. A "time-out" was performed as per usual protocol. The foot and ankle were exsanguinated using an Esmarch and the tourniquet was inflated to 300 mmHg. A lateral longitudinal incision was made in line with the distal fibula. Dissection was carried down to the fracture site and the site was debrided of hematoma and soft tissue. A provisional reduction of the distal fibular fracture was performed and position visualized using the FluoroScan. A 6 hole one third tubular plate was contoured to fit along the lateral aspect of the distal fibula. The plate was then secured using a combination of five 3.5 mm cortical screws and one 4.0 mm fully threaded cancellous  screws. Good reduction and hardware placement was noted using the FluoroScan. The lateral incision was then closed in layers using first #0 Vicryl followed by #2-0 Vicryl.  Next, attention was directed to the medial malleolus. A medial incision was made and dissection was carried down to the distal aspect of the medial malleolus. Provisional reduction was performed and visualized using the FluoroScan. The reduction was maintained using 2 distally threaded guidewires passed in a retrograde fashion and in parallel position. Good position was noted. Measurements were obtained and two 4.0 mm cannulated partially threaded cancellous screws were advanced over the guidewires. Good position was noted. Guidewires were removed and the wound was irrigated with copious amounts of saline with antibiotic solution and then closed using combination of #0 Vicryl followed #2-0 Vicryl. Good reduction with restoration of the ankle mortise and good position of hardware was noted in multiple planes using FluoroScan. Skin edges were then reapproximated using skin staples. Tourniquet was deflated after total tourniquet time of 68 minutes. A sterile dressing was applied followed by application of a posterior splint.   The patient tolerated the procedure well and was transported to the PACU in stable condition.  [name]., M.D.

## 2017-08-08 NOTE — Progress Notes (Signed)
15 minute call to floor. 

## 2017-08-08 NOTE — Transfer of Care (Signed)
Immediate Anesthesia Transfer of Care Note  Patient: Dana Perez [facility]  Procedure(s) Performed: OPEN REDUCTION INTERNAL FIXATION (ORIF) ANKLE FRACTURE (Left )  Patient Location: PACU  Anesthesia Type:General  Level of Consciousness: sedated  Airway & Oxygen Therapy: Patient Spontanous Breathing and Patient connected to nasal cannula oxygen  Post-op Assessment: Report given to RN and Post -op Vital signs reviewed and stable  Post vital signs: Reviewed and stable  Last Vitals:  Vitals:   08/08/17 1249 08/08/17 1250  BP: 124/61   Pulse:    Resp: 11   Temp: 37.3 C 37.3 C  SpO2:      Last Pain:  Vitals:   08/08/17 0950  TempSrc:   PainSc: Asleep         Complications: No apparent anesthesia complications

## 2017-08-08 NOTE — Anesthesia Postprocedure Evaluation (Signed)
Anesthesia Post Note  Patient: Dana Perez [facility]  Procedure(s) Performed: OPEN REDUCTION INTERNAL FIXATION (ORIF) ANKLE FRACTURE (Left )  Patient location during evaluation: PACU Anesthesia Type: General Level of consciousness: awake and alert and oriented Pain management: pain level controlled Vital Signs Assessment: post-procedure vital signs reviewed and stable Respiratory status: spontaneous breathing Cardiovascular status: blood pressure returned to baseline Anesthetic complications: no     Last Vitals:  Vitals:   08/08/17 1327 08/08/17 1346  BP:  (!) 146/60  Pulse: 81 80  Resp: 15   Temp:  37.3 C  SpO2: 94% 91%    Last Pain:  Vitals:   08/08/17 1346  TempSrc: Oral  PainSc: 0-No pain                 Leone Putman

## 2017-08-09 LAB — CBC
HEMATOCRIT: 33 % — AB (ref 35.0–47.0)
HEMOGLOBIN: 11.1 g/dL — AB (ref 12.0–16.0)
MCH: 30.3 pg (ref 26.0–34.0)
MCHC: 33.7 g/dL (ref 32.0–36.0)
MCV: 89.9 fL (ref 80.0–100.0)
Platelets: 128 10*3/uL — ABNORMAL LOW (ref 150–440)
RBC: 3.67 MIL/uL — AB (ref 3.80–5.20)
RDW: 12.2 % (ref 11.5–14.5)
WBC: 8 10*3/uL (ref 3.6–11.0)

## 2017-08-09 LAB — BASIC METABOLIC PANEL
ANION GAP: 7 (ref 5–15)
BUN: 11 mg/dL (ref 6–20)
CHLORIDE: 103 mmol/L (ref 101–111)
CO2: 24 mmol/L (ref 22–32)
CREATININE: 0.83 mg/dL (ref 0.44–1.00)
Calcium: 7.9 mg/dL — ABNORMAL LOW (ref 8.9–10.3)
GFR calc non Af Amer: >60 mL/min (ref >60)
Glucose, Bld: 108 mg/dL — ABNORMAL HIGH (ref 65–99)
POTASSIUM: 3.6 mmol/L (ref 3.5–5.1)
SODIUM: 134 mmol/L — AB (ref 135–145)

## 2017-08-09 MED ORDER — ADULT MULTIVITAMIN W/MINERALS CH
1.0000 | ORAL_TABLET | Freq: Every day | ORAL | Status: DC
  Administered 2017-08-10: 1 via ORAL
  Filled 2017-08-09: qty 1

## 2017-08-09 MED ORDER — ENSURE ENLIVE PO LIQD
237.0000 mL | Freq: Two times a day (BID) | ORAL | Status: DC
  Administered 2017-08-10: 237 mL via ORAL

## 2017-08-09 NOTE — Progress Notes (Signed)
  Subjective: 1 Day Post-Op Procedure(s) (LRB): OPEN REDUCTION INTERNAL FIXATION (ORIF) ANKLE FRACTURE (Left) Patient reports pain as mild.   Patient is well, and has had no acute complaints or problems PT and care management to assist with discharge planning. Negative for chest pain and shortness of breath Fever: no Gastrointestinal:Negative for nausea and vomiting  Objective: Vital signs in last 24 hours: Temp:  [97.8 F (36.6 C)-99.2 F (37.3 C)] 98.6 F (37 C) (12/23 0810) Pulse Rate:  [44-83] 75 (12/23 0810) Resp:  [11-16] 15 (12/23 0425) BP: (97-149)/(53-110) 97/80 (12/23 0810) SpO2:  [91 %-100 %] 95 % (12/23 0810)  Intake/Output from previous day:  Intake/Output Summary (Last 24 hours) at 08/09/2017 1022 Last data filed at 08/09/2017 0900 Gross per 24 hour  Intake 2485 ml  Output 225 ml  Net 2260 ml    Intake/Output this shift: Total I/O In: 120 [P.O.:120] Out: -   Labs: Recent Labs    08/07/17 1214 08/08/17 0357 08/09/17 0335  HGB 12.8 10.9* 11.1*   Recent Labs    08/08/17 0357 08/09/17 0335  WBC 7.5 8.0  RBC 3.57* 3.67*  HCT 31.8* 33.0*  PLT 142* 128*   Recent Labs    08/07/17 1214 08/09/17 0335  NA 137 134*  K 3.5 3.6  CL 103 103  CO2 26 24  BUN 18 11  CREATININE 1.04* 0.83  GLUCOSE 96 108*  CALCIUM 8.5* 7.9*   Recent Labs    08/07/17 1214  INR 0.92     EXAM General - Patient is Alert, Appropriate and Oriented Extremity - ABD soft Sensation intact distally Intact pulses distally Incision: dressing C/D/I No cellulitis present Dressing/Incision - clean, dry, no drainage to the left leg splint, polar-care unit intact. Motor Function - intact, able to flex and extend toes on command, intact to light touch to the left lower extremity.  Past Medical History:  Diagnosis Date  . Adenocarcinoma of colon ([place])   . Benign neoplasm of ascending colon   . Essential hypertension 06/13/2014  . Goals of care, counseling/discussion  07/14/2017  . HLD (hyperlipidemia) 06/13/2014  . Hypercholesteremia   . Hyperlipidemia   . Multiple thyroid nodules 01/22/2012  . Osteoporosis   . Polycythemia, secondary 12/26/2014  . Pure hypercholesterolemia 03/15/2015  . Thyroid nodule     Assessment/Plan: 1 Day Post-Op Procedure(s) (LRB): OPEN REDUCTION INTERNAL FIXATION (ORIF) ANKLE FRACTURE (Left) Active Problems:   Closed left ankle fracture  Estimated body mass index is 20.25 kg/m as calculated from the following:   Height as of this encounter: 5\' 4"  (1.626 m).   Weight as of this encounter: 53.5 kg (118 lb). Advance diet Up with therapy D/C IV fluids when tolerating po intake.  Labs reviewed. Continue working with therapy. Will likely need rehab following discharge. Upon discharge, follow-up with [place] in 10-14 days for staple removal. Upon discharge can continue with Lovenox 40mg  daily x 14 days for Blood Clot Prevention.  DVT Prophylaxis - Lovenox, TED hose and SCDs Non-weightbearing to the left ankle, WBAT to the right ankle.  Dana Astryd Pearcy, PA-C [facility] Orthopaedic Surgery 08/09/2017, 10:22 AM

## 2017-08-09 NOTE — Progress Notes (Signed)
Initial Nutrition Assessment  DOCUMENTATION CODES:   Non-severe (moderate) malnutrition in context of chronic illness  INTERVENTION:  Provide Ensure Enlive po BID, each supplement provides 350 kcal and 20 grams of protein.  Provide daily MVI.  Encouraged ongoing intake of calorie- and protein-dense foods at meals and snacks. Discussed small, frequent meals in setting of early satiety.  NUTRITION DIAGNOSIS:   Moderate Malnutrition related to chronic illness(Stg IV SCC of head and neck on chemotherapy/XRT, adenocarcinoma of colon) as evidenced by moderate fat depletion, mild muscle depletion.  GOAL:   Patient will meet greater than or equal to 90% of their needs  MONITOR:   PO intake, Supplement acceptance, Labs, Weight trends, I & O's, Skin  REASON FOR ASSESSMENT:   Malnutrition Screening Tool    ASSESSMENT:   76 year old female with PMHx of OP, HLD, stage IVa SCC of head and neck on chemotherapy/XRT, adenocarcinoma of colon (plan is to delay surgery or treatment until after head ane neck treatments are complete) now admitted with bilateral ankle fractures s/p ORIF of left trimalleolar ankle fracture on 12/22.   Met with patient at bedside. She reports she started chemotherapy about 3 weeks ago. The first week she did well, but the last 2 weeks she has had a decreased appetite. She endorses anorexia and early satiety. She is still able to eat 3 smaller meals per day with occasional snacks. She is using some ideas she learned from her meeting with outpatient RD. She has been making soup, chicken salad sandwiches, and Frank'n Beans (beanie weenies). She enjoyed the chocolate supplement RD provided for her to try, but she cannot remember which one that was. She is amenable to drinking Ensure here to help meet calorie/protein needs and promote healing. She denies any N/V, abdominal pain, or difficulty chewing/swallowing. Patient does endorse constipation, but reports her MD is working on  a good bowel regimen for her.  UBW 125-127 lbs. Patient was 124.9 lbs on 07/01/2017. She has lost 6.9 lbs (5.5% body weight) over 1.5 months, which is not significant for time frame.  Medications reviewed and include: Decadron 4 mg daily, ferrous sulfate 325 mg BID, Reglan 10 mg TID and QHS, pantoprazole, senna, Carafate.  Labs reviewed: Sodium 134.  NUTRITION - FOCUSED PHYSICAL EXAM:    Most Recent Value  Orbital Region  Moderate depletion  Upper Arm Region  Moderate depletion  Thoracic and Lumbar Region  Mild depletion  Buccal Region  Moderate depletion  Temple Region  Mild depletion  Clavicle Bone Region  Mild depletion  Clavicle and Acromion Bone Region  Mild depletion  Scapular Bone Region  Mild depletion  Dorsal Hand  Moderate depletion  Patellar Region  Mild depletion  Anterior Thigh Region  Mild depletion  Posterior Calf Region  Mild depletion  Edema (RD Assessment)  None  Hair  Reviewed  Eyes  Reviewed  Mouth  Reviewed  Skin  Reviewed  Nails  Reviewed     Diet Order:  Diet regular Room service appropriate? Yes; Fluid consistency: Thin  EDUCATION NEEDS:   No education needs have been identified at this time  Skin:  Skin Assessment: Skin Integrity Issues: Skin Integrity Issues:: Incisions Incisions: closed incision to left ankle  Last BM:  08/07/2017  Height:   Ht Readings from Last 1 Encounters:  08/07/17 _0  (1.626 m)    Weight:   Wt Readings from Last 1 Encounters:  08/07/17 118 lb (53.5 kg)    Ideal Body Weight:  54.5 kg  BMI:  Body mass index is 20.25 kg/m.  Estimated Nutritional Needs:   Kcal:  1340-1605 (25-30 kcal/kg)  Protein:  65-80 grams (1.2-1.5 grams/kg)  Fluid:  1.3-1.6 L/day (25-30 mL/kg)  Willey Blade, MS, RD, LDN Office: [phone] Pager: [phone] After Hours/Weekend Pager: [phone]

## 2017-08-09 NOTE — Progress Notes (Signed)
        Awaiting medication from pharmacy. Pharmacy rep Lonn Georgia states that it will be a minute before medication can be sent up. When asked a specific time one could not be given.

## 2017-08-09 NOTE — Progress Notes (Signed)
[place] [facility] [place] NAME: Dana Perez    MR#:  [id number]  DATE OF BIRTH:  February 27, 1941  SUBJECTIVE:  CHIEF COMPLAINT:   Chief Complaint  Patient presents with  . Weakness   Better bilateral ankle pain. POD1. REVIEW OF SYSTEMS:  Review of Systems  Constitutional: Negative for chills, fever and malaise/fatigue.  HENT: Negative for sore throat.   Eyes: Negative for blurred vision and double vision.  Respiratory: Negative for cough, hemoptysis, shortness of breath, wheezing and stridor.   Cardiovascular: Negative for chest pain, palpitations, orthopnea and leg swelling.  Gastrointestinal: Negative for abdominal pain, blood in stool, diarrhea, melena, nausea and vomiting.  Genitourinary: Negative for dysuria, flank pain and hematuria.  Musculoskeletal: Positive for joint pain. Negative for back pain.  Skin: Negative for rash.  Neurological: Negative for dizziness, sensory change, focal weakness, seizures, loss of consciousness, weakness and headaches.  Endo/Heme/Allergies: Negative for polydipsia.  Psychiatric/Behavioral: Negative for depression. The patient is not nervous/anxious.     DRUG ALLERGIES:   Allergies  Allergen Reactions  . Other Other (See Comments) and Rash    TDAP   VITALS:  Blood pressure 97/80, pulse 75, temperature 98.6 F (37 C), temperature source Oral, resp. rate 15, height 5\' 4"  (1.626 m), weight 118 lb (53.5 kg), SpO2 95 %. PHYSICAL EXAMINATION:  Physical Exam  Constitutional: She is oriented to person, place, and time and well-developed, well-nourished, and in no distress.  HENT:  Head: Normocephalic.  Mouth/Throat: Oropharynx is clear and moist.  Eyes: Conjunctivae and EOM are normal. Pupils are equal, round, and reactive to light. No scleral icterus.  Neck: Normal range of motion. Neck supple. No JVD present. No tracheal deviation present.  Cardiovascular: Normal rate, regular rhythm and normal  heart sounds. Exam reveals no gallop.  No murmur heard. Pulmonary/Chest: Effort normal and breath sounds normal. No respiratory distress. She has no wheezes. She has no rales.  Abdominal: Soft. Bowel sounds are normal. She exhibits no distension. There is no tenderness. There is no rebound.  Musculoskeletal: She exhibits no edema or tenderness.  Bilateral foot in splints and dressing.  Neurological: She is alert and oriented to person, place, and time. No cranial nerve deficit.  Skin: No rash noted. No erythema.  Psychiatric: Affect normal.   LABORATORY PANEL:  Female CBC Recent Labs  Lab 08/09/17 0335  WBC 8.0  HGB 11.1*  HCT 33.0*  PLT 128*   ------------------------------------------------------------------------------------------------------------------ Chemistries  Recent Labs  Lab 08/07/17 1214 08/09/17 0335  NA 137 134*  K 3.5 3.6  CL 103 103  CO2 26 24  GLUCOSE 96 108*  BUN 18 11  CREATININE 1.04* 0.83  CALCIUM 8.5* 7.9*  AST 22  --   ALT 19  --   ALKPHOS 70  --   BILITOT 0.4  --    RADIOLOGY:  No results found. ASSESSMENT AND PLAN:    Bilateral ankle fractures. OPEN REDUCTION INTERNAL FIXATION (ORIF) ANKLE POD1. DVT prophylaxis with lovenox for 14 days. PT:SNF.  Hypertension.  Continue home Norvasc.  Stage IV head and neck cancer.  Continue dexamethasone.  Follow-up Dr. Grayland Ormond as outpatient.  Tobacco abuse.  Smoking cessation was counseled for 3-4 minutes.  Discussed with Dr. Marry Guan. All the records are reviewed and case discussed with Care Management/Social Worker. Management plans discussed with the patient, her hudsband and they are in agreement.  CODE STATUS: Full Code  TOTAL TIME TAKING CARE OF THIS PATIENT: 32 minutes.  More than 50% of the time was spent in counseling/coordination of care: YES  POSSIBLE D/C IN 1-2 DAYS, DEPENDING ON CLINICAL CONDITION.   Demetrios Loll M.D on 08/09/2017 at 1:45 PM  Between 7am to 6pm - Pager -  [phone]  After 6pm go to [email or web address] - [facility]

## 2017-08-09 NOTE — Evaluation (Signed)
Physical Therapy Evaluation Patient Details Name: Dana Perez MRN: [id number] DOB: 1941-05-16 Today's Date: 08/09/2017   History of Present Illness  Pt is a3 y.o.femalewith a known history of stage IV head and neck cancer receiving radiation 08/07/17. When she got home she felt very weak and fell out of the car injuring both ankles. She denies any headache or dizziness, no loss of consciousness or incontinence. The patient complained of bilateral footpain. X-rays show bilateral ankle fractures.  Pt is now s/p open reduction and internal fixation of the lefttrimalleolarankle fracture (medial and lateral).    Clinical Impression  Pt presents with deficits in strength, transfers, mobility, gait, balance, and activity tolerance.  Pt c/o no pain at rest and only minimal pain to L ankle in dependent position.  No pain reported to R ankle during WB.  Pt required extra time and effort for sup to/from sit bed mobility but no physical assistance.  Pt able to stand up from elevated EOB to RW with min A and extensive cues for sequencing to ensure compliance with LLE NWB status.  Pt tremulous in standing with min A at times to prevent posterior LOB and fatigued quickly requiring to return to sitting.  Transfer to recliner not attempted this session for pt safety.  Pt will benefit from PT services in a SNF setting upon discharge to safely address above deficits for decreased caregiver assistance and eventual return to PLOF.      Follow Up Recommendations SNF    Equipment Recommendations  Rolling walker with 5" wheels;Other (comment)(TBD at next venue of care if discharges to SNF)    Recommendations for Other Services       Precautions / Restrictions Precautions Precautions: Fall Required Braces or Orthoses: Other Brace/Splint Other Brace/Splint: Dr. Marry Guan stated he is looking to find a splint or bace to support the pt's R ankle but pt OK to be WBAT to RLE even though no brace has been  secured yet.  Restrictions Weight Bearing Restrictions: Yes RLE Weight Bearing: Weight bearing as tolerated LLE Weight Bearing: Non weight bearing      Mobility  Bed Mobility Overal bed mobility: Needs Assistance Bed Mobility: Supine to Sit;Sit to Supine     Supine to sit: Supervision Sit to supine: Supervision   General bed mobility comments: Significant increase in time and effort for bed mobility tasks but no physical assistance required.   Transfers Overall transfer level: Needs assistance Equipment used: Rolling walker (2 wheeled) Transfers: Sit to/from Stand Sit to Stand: Min assist         General transfer comment: Mod verbal cues for sequencing to maintain LLE NWB status.  This PT's foot placed under pt's L foot to ensure WB compliance.  Ambulation/Gait             General Gait Details: Unable/unsafe to attempt  Stairs            Wheelchair Mobility    Modified Rankin (Stroke Patients Only)       Balance Overall balance assessment: Needs assistance Sitting-balance support: Feet unsupported;Bilateral upper extremity supported Sitting balance-Leahy Scale: Good     Standing balance support: Bilateral upper extremity supported Standing balance-Leahy Scale: Poor Standing balance comment: Pt tremulous in standing with posterior lean with assistance required at times for stability                             Pertinent Vitals/Pain Pain Assessment: No/denies pain  Home Living Family/patient expects to be discharged to:: Private residence Living Arrangements: Spouse/significant other Available Help at Discharge: Family;Available 24 hours/day Type of Home: House Home Access: Stairs to enter Entrance Stairs-Rails: Right;Left(Can not reach both) Entrance Stairs-Number of Steps: 6 Home Layout: One level Home Equipment: None      Prior Function Level of Independence: Independent         Comments: Pt Ind with amb without AD  limited community distances, Ind with ADLs, no other history of falls     Hand Dominance   Dominant Hand: Right    Extremity/Trunk Assessment   Upper Extremity Assessment Upper Extremity Assessment: Overall WFL for tasks assessed    Lower Extremity Assessment Lower Extremity Assessment: Generalized weakness       Communication   Communication: No difficulties  Cognition Arousal/Alertness: Awake/alert Behavior During Therapy: WFL for tasks assessed/performed Overall Cognitive Status: Within Functional Limits for tasks assessed                                        General Comments      Exercises Total Joint Exercises Quad Sets: Strengthening;Both;10 reps Gluteal Sets: Strengthening;Both;10 reps Heel Slides: AROM;Right;10 reps Hip ABduction/ADduction: AROM;AAROM;Both;10 reps Straight Leg Raises: AROM;AAROM;Both;10 reps Long Arc Quad: AROM;Both;10 reps Knee Flexion: AROM;Both;10 reps   Assessment/Plan    PT Assessment Patient needs continued PT services  PT Problem List Decreased strength;Decreased activity tolerance;Decreased balance;Decreased knowledge of use of DME;Decreased mobility       PT Treatment Interventions DME instruction;Gait training;Functional mobility training;Stair training;Neuromuscular re-education;Balance training;Therapeutic exercise;Therapeutic activities;Patient/family education    PT Goals (Current goals can be found in the Care Plan section)  Acute Rehab PT Goals Patient Stated Goal: "To be able to get out of this bed" PT Goal Formulation: With patient Time For Goal Achievement: 08/22/17 Potential to Achieve Goals: Good    Frequency BID   Barriers to discharge Inaccessible home environment      Co-evaluation               AM-PAC PT "6 Clicks" Daily Activity  Outcome Measure Difficulty turning over in bed (including adjusting bedclothes, sheets and blankets)?: A Little Difficulty moving from lying on back  to sitting on the side of the bed? : A Little Difficulty sitting down on and standing up from a chair with arms (e.g., wheelchair, bedside commode, etc,.)?: Unable Help needed moving to and from a bed to chair (including a wheelchair)?: Total Help needed walking in hospital room?: Total Help needed climbing 3-5 steps with a railing? : Total 6 Click Score: 10    End of Session Equipment Utilized During Treatment: Gait belt Activity Tolerance: Patient tolerated treatment well Patient left: with bed alarm set;with family/visitor present;with call bell/phone within reach;in bed;with SCD's reapplied;Other (comment)(Polar care donned to bilateral ankles) Nurse Communication: Mobility status PT Visit Diagnosis: Muscle weakness (generalized) (M62.81);Other abnormalities of gait and mobility (R26.89)    Time: 4098-1191 PT Time Calculation (min) (ACUTE ONLY): 45 min   Charges:   PT Evaluation $PT Eval Low Complexity: 1 Low PT Treatments $Therapeutic Exercise: 8-22 mins   PT G Codes:   PT G-Codes **NOT FOR INPATIENT CLASS** Functional Assessment Tool Used: AM-PAC 6 Clicks Basic Mobility Functional Limitation: Mobility: Walking and moving around Mobility: Walking and Moving Around Current Status (Y7829): At least 60 percent but less than 80 percent impaired, limited or restricted Mobility: Walking and  Moving Around Goal Status [phone]): At least 1 percent but less than 20 percent impaired, limited or restricted    D. Scott Yuriy Cui PT, DPT 08/09/17, 1:18 PM

## 2017-08-10 ENCOUNTER — Encounter: Payer: Self-pay | Admitting: Orthopedic Surgery

## 2017-08-10 ENCOUNTER — Ambulatory Visit: Payer: PPO

## 2017-08-10 ENCOUNTER — Inpatient Hospital Stay: Payer: PPO

## 2017-08-10 ENCOUNTER — Inpatient Hospital Stay: Payer: PPO | Admitting: Oncology

## 2017-08-10 DIAGNOSIS — S82852D Displaced trimalleolar fracture of left lower leg, subsequent encounter for closed fracture with routine healing: Secondary | ICD-10-CM | POA: Diagnosis not present

## 2017-08-10 DIAGNOSIS — C760 Malignant neoplasm of head, face and neck: Secondary | ICD-10-CM | POA: Diagnosis not present

## 2017-08-10 DIAGNOSIS — R531 Weakness: Secondary | ICD-10-CM | POA: Diagnosis not present

## 2017-08-10 DIAGNOSIS — M25571 Pain in right ankle and joints of right foot: Secondary | ICD-10-CM | POA: Diagnosis not present

## 2017-08-10 DIAGNOSIS — C7970 Secondary malignant neoplasm of unspecified adrenal gland: Secondary | ICD-10-CM | POA: Diagnosis not present

## 2017-08-10 DIAGNOSIS — C189 Malignant neoplasm of colon, unspecified: Secondary | ICD-10-CM | POA: Diagnosis not present

## 2017-08-10 DIAGNOSIS — F17210 Nicotine dependence, cigarettes, uncomplicated: Secondary | ICD-10-CM | POA: Diagnosis not present

## 2017-08-10 DIAGNOSIS — Z79899 Other long term (current) drug therapy: Secondary | ICD-10-CM | POA: Diagnosis not present

## 2017-08-10 DIAGNOSIS — M6281 Muscle weakness (generalized): Secondary | ICD-10-CM | POA: Diagnosis not present

## 2017-08-10 DIAGNOSIS — E440 Moderate protein-calorie malnutrition: Secondary | ICD-10-CM

## 2017-08-10 DIAGNOSIS — E7800 Pure hypercholesterolemia, unspecified: Secondary | ICD-10-CM | POA: Diagnosis not present

## 2017-08-10 DIAGNOSIS — E785 Hyperlipidemia, unspecified: Secondary | ICD-10-CM | POA: Diagnosis not present

## 2017-08-10 DIAGNOSIS — S92154D Nondisplaced avulsion fracture (chip fracture) of right talus, subsequent encounter for fracture with routine healing: Secondary | ICD-10-CM | POA: Diagnosis not present

## 2017-08-10 DIAGNOSIS — R5383 Other fatigue: Secondary | ICD-10-CM | POA: Diagnosis not present

## 2017-08-10 DIAGNOSIS — Z510 Encounter for antineoplastic radiation therapy: Secondary | ICD-10-CM | POA: Diagnosis not present

## 2017-08-10 DIAGNOSIS — S82891D Other fracture of right lower leg, subsequent encounter for closed fracture with routine healing: Secondary | ICD-10-CM | POA: Diagnosis not present

## 2017-08-10 DIAGNOSIS — E041 Nontoxic single thyroid nodule: Secondary | ICD-10-CM | POA: Diagnosis not present

## 2017-08-10 DIAGNOSIS — C182 Malignant neoplasm of ascending colon: Secondary | ICD-10-CM | POA: Diagnosis not present

## 2017-08-10 DIAGNOSIS — M25572 Pain in left ankle and joints of left foot: Secondary | ICD-10-CM | POA: Diagnosis not present

## 2017-08-10 DIAGNOSIS — S82892D Other fracture of left lower leg, subsequent encounter for closed fracture with routine healing: Secondary | ICD-10-CM | POA: Diagnosis not present

## 2017-08-10 DIAGNOSIS — M810 Age-related osteoporosis without current pathological fracture: Secondary | ICD-10-CM | POA: Diagnosis not present

## 2017-08-10 DIAGNOSIS — Z5112 Encounter for antineoplastic immunotherapy: Secondary | ICD-10-CM | POA: Diagnosis not present

## 2017-08-10 DIAGNOSIS — Z7401 Bed confinement status: Secondary | ICD-10-CM | POA: Diagnosis not present

## 2017-08-10 DIAGNOSIS — Z5111 Encounter for antineoplastic chemotherapy: Secondary | ICD-10-CM | POA: Diagnosis not present

## 2017-08-10 DIAGNOSIS — Z23 Encounter for immunization: Secondary | ICD-10-CM | POA: Diagnosis not present

## 2017-08-10 DIAGNOSIS — G893 Neoplasm related pain (acute) (chronic): Secondary | ICD-10-CM | POA: Diagnosis not present

## 2017-08-10 DIAGNOSIS — R110 Nausea: Secondary | ICD-10-CM | POA: Diagnosis not present

## 2017-08-10 DIAGNOSIS — S82899A Other fracture of unspecified lower leg, initial encounter for closed fracture: Secondary | ICD-10-CM | POA: Diagnosis not present

## 2017-08-10 DIAGNOSIS — I10 Essential (primary) hypertension: Secondary | ICD-10-CM | POA: Diagnosis not present

## 2017-08-10 DIAGNOSIS — Z7901 Long term (current) use of anticoagulants: Secondary | ICD-10-CM | POA: Diagnosis not present

## 2017-08-10 DIAGNOSIS — Z79891 Long term (current) use of opiate analgesic: Secondary | ICD-10-CM | POA: Diagnosis not present

## 2017-08-10 MED ORDER — OXYCODONE HCL 5 MG PO TABS: 5.0000 mg | ORAL_TABLET | Freq: Four times a day (QID) | ORAL | 0 refills | Status: DC | PRN

## 2017-08-10 MED ORDER — ENOXAPARIN SODIUM 40 MG/0.4ML ~~LOC~~ SOLN: 40.0000 mg | SUBCUTANEOUS | Status: DC

## 2017-08-10 NOTE — Care Management Important Message (Signed)
Important Message  Patient Details  Name: Dana Perez MRN: [id number] Date of Birth: 09-Sep-1940   Medicare Important Message Given:  Yes    Shelbie Ammons, RN 08/10/2017, 7:59 AM

## 2017-08-10 NOTE — Clinical Social Work Placement (Signed)
   CLINICAL SOCIAL WORK PLACEMENT  NOTE  Date:  08/10/2017  Patient Details  Name: Dana Perez MRN: [id number] Date of Birth: 1941/06/04  Clinical Social Work is seeking post-discharge placement for this patient at the [place] level of care (*CSW will initial, date and re-position this form in  chart as items are completed):  Yes   Patient/family provided with [facility] Work Department's list of facilities offering this level of care within the geographic area requested by the patient (or if unable, by the patient's family).  Yes   Patient/family informed of their freedom to choose among providers that offer the needed level of care, that participate in Medicare, Medicaid or managed care program needed by the patient, have an available bed and are willing to accept the patient.  Yes   Patient/family informed of [facility]'s ownership interest in [facility] and [facility], as well as of the fact that they are under no obligation to receive care at these facilities.  PASRR submitted to EDS on 08/07/17     PASRR number received on 08/07/17     Existing PASRR number confirmed on       FL2 transmitted to all facilities in geographic area requested by pt/family on 08/07/17     FL2 transmitted to all facilities within larger geographic area on       Patient informed that his/her managed care company has contracts with or will negotiate with certain facilities, including the following:        Yes   Patient/family informed of bed offers received.  Patient chooses bed at ([facility] )     Physician recommends and patient chooses bed at      Patient to be transferred to ([facility] ) on 08/10/17.  Patient to be transferred to facility by [facility] )     Patient family notified on 08/10/17 of transfer.  Name of family member notified:  (Patient's husband Rusty Aus is at bedside and aware of D/C today. )     PHYSICIAN       Additional  Comment:    _______________________________________________ Calliope Delangel, Veronia Beets, LCSW 08/10/2017, 1:30 PM

## 2017-08-10 NOTE — Progress Notes (Signed)
Clinical Education officer, museum (CSW) met with patient's husband Rusty Aus and presented SNF bed offers. Husband chose [facility]. Per [facility] liaison patient can come today to room 704. RN will call report to RN Yaakov Guthrie at [phone] and arrange EMS for transport. [facility] authorization has been received, auth # [id number]. CSW sent D/C orders to [facility] via [facility]. Patient and her husband are aware of above. Please reconsult if future social work needs arise. CSW signing off.   [employer], LCSW [phone]

## 2017-08-10 NOTE — Evaluation (Signed)
Occupational Therapy Evaluation Patient Details Name: Dana Perez MRN: [id number] DOB: 08-Apr-1941 Today's Date: 08/10/2017    History of Present Illness Pt is a81 y.o.femalewith a known history of stage IV head and neck cancer receiving radiation 08/07/17. When she got home she felt very weak and fell out of the car injuring both ankles. She denies any headache or dizziness, no loss of consciousness or incontinence. The patient complained of bilateral footpain. X-rays show bilateral ankle fractures.  Pt is now s/p open reduction and internal fixation of the lefttrimalleolarankle fracture (medial and lateral).   Clinical Impression   Patient seen for OT evaluation this date.  Patient s/p surgery to left ankle and now NWB, RLE with airsplint and WBAT.  Patient lives at home with her husband and has 6 steps to enter the home.  She has been going to chemo and radiation for her cancer treatments which is now on hold.  Her husband reports a decline in function and increased weakness in the last few weeks while receiving chemo.  She presents with muscle weakness, pain in BLE, decreased balance, transfers, functional mobility and decreased ability to perform self care and IADL tasks.  She would benefit from skilled OT to maximize safety and independence in daily tasks. She would also benefit from short term rehab prior to returning home    Follow Up Recommendations  SNF    Equipment Recommendations  3 in 1 bedside commode;Tub/shower bench    Recommendations for Other Services       Precautions / Restrictions Precautions Precautions: Fall Required Braces or Orthoses: Other Brace/Splint Other Brace/Splint: airsplint to RLE at all times, WBAT.  LLE NWB Restrictions Weight Bearing Restrictions: Yes RLE Weight Bearing: Weight bearing as tolerated LLE Weight Bearing: Non weight bearing      Mobility Bed Mobility Overal bed mobility: Needs Assistance Bed Mobility: Supine to  Sit;Sit to Supine     Supine to sit: Supervision Sit to supine: Supervision      Transfers Overall transfer level: Needs assistance Equipment used: Rolling walker (2 wheeled) Transfers: Sit to/from Stand Sit to Stand: Min assist         General transfer comment: cues for weight bearing with standing    Balance Overall balance assessment: Needs assistance Sitting-balance support: Feet unsupported;Bilateral upper extremity supported Sitting balance-Leahy Scale: Good     Standing balance support: Bilateral upper extremity supported Standing balance-Leahy Scale: Poor                             ADL either performed or assessed with clinical judgement   ADL Overall ADL's : Needs assistance/impaired Eating/Feeding: Modified independent   Grooming: Modified independent;Sitting   Upper Body Bathing: Set up;Sitting   Lower Body Bathing: Moderate assistance   Upper Body Dressing : Set up;Sitting   Lower Body Dressing: Maximal assistance   Toilet Transfer: Minimal assistance   Toileting- Clothing Manipulation and Hygiene: Minimal assistance       Functional mobility during ADLs: Minimal assistance       Vision Baseline Vision/History: Wears glasses Wears Glasses: Reading only       Perception     Praxis      Pertinent Vitals/Pain Pain Assessment: 0-10 Pain Score: 5  Pain Location: LLE ankle Pain Descriptors / Indicators: Aching Pain Intervention(s): Limited activity within patient's tolerance;Repositioned;Monitored during session     Hand Dominance Right   Extremity/Trunk Assessment Upper Extremity Assessment Upper Extremity Assessment: Generalized  weakness   Lower Extremity Assessment Lower Extremity Assessment: Defer to PT evaluation       Communication Communication Communication: No difficulties   Cognition Arousal/Alertness: Awake/alert Behavior During Therapy: WFL for tasks assessed/performed Overall Cognitive Status:  Within Functional Limits for tasks assessed                                     General Comments       Exercises     Shoulder Instructions      Home Living Family/patient expects to be discharged to:: Private residence Living Arrangements: Spouse/significant other Available Help at Discharge: Family;Available 24 hours/day Type of Home: House Home Access: Stairs to enter [employer] of Steps: 6 Entrance Stairs-Rails: Right;Left Home Layout: One level     Bathroom Shower/Tub: Corporate investment banker: Standard Bathroom Accessibility: Yes   Home Equipment: None          Prior Functioning/Environment Level of Independence: Independent        Comments: Pt Ind with amb without AD limited community distances, Ind with ADLs, no other history of falls        OT Problem List: Decreased strength;Impaired balance (sitting and/or standing);Pain;Decreased range of motion;Decreased activity tolerance;Decreased knowledge of use of DME or AE      OT Treatment/Interventions: Self-care/ADL training;DME and/or AE instruction;Balance training;Therapeutic exercise;Patient/family education    OT Goals(Current goals can be found in the care plan section) Acute Rehab OT Goals Patient Stated Goal: wants to be able to walk again OT Goal Formulation: With patient Time For Goal Achievement: 08/15/17 Potential to Achieve Goals: Good  OT Frequency: Min 1X/week   Barriers to D/C:            Co-evaluation              AM-PAC PT "6 Clicks" Daily Activity     Outcome Measure Help from another person eating meals?: None Help from another person taking care of personal grooming?: A Little Help from another person toileting, which includes using toliet, bedpan, or urinal?: A Lot Help from another person bathing (including washing, rinsing, drying)?: A Lot Help from another person to put on and taking off regular upper body clothing?: A  Little Help from another person to put on and taking off regular lower body clothing?: A Lot 6 Click Score: 16   End of Session Equipment Utilized During Treatment: Gait belt;Rolling walker;Other (comment)  Activity Tolerance: Patient tolerated treatment well Patient left: in chair;with call bell/phone within reach;with chair alarm set;with family/visitor present  OT Visit Diagnosis: Unsteadiness on feet (R26.81);Muscle weakness (generalized) (M62.81);Pain Pain - Right/Left: Left Pain - part of body: Ankle and joints of foot                Time: 9735-3299 OT Time Calculation (min): 25 min Charges:  OT General Charges $OT Visit: 1 Visit OT Evaluation $OT Eval Low Complexity: 1 Low OT Treatments $Self Care/Home Management : 8-22 mins G-Codes: OT G-codes **NOT FOR INPATIENT CLASS** Functional Assessment Tool Used: AM-PAC 6 Clicks Daily Activity Functional Limitation: Self care Self Care Current Status (M4268): At least 40 percent but less than 60 percent impaired, limited or restricted Self Care Goal Status (T4196): At least 20 percent but less than 40 percent impaired, limited or restricted   Bradleigh Sonnen T Alaijah Gibler, OTR/L, CLT   Jhon Mallozzi 08/10/2017, 9:59 AM

## 2017-08-10 NOTE — Progress Notes (Signed)
Report given to Kim at [facility]. EMS called for transfer.

## 2017-08-10 NOTE — Care Management Note (Signed)
Case Management Note  Patient Details  Name: Dana Perez MRN: [id number] Date of Birth: Apr 07, 1941  Subjective/Objective:   Admitted to [facility] with bilateral ankle fractures.                  Action/Plan: Physical therapy evaluation completed., Recommending skilled nursing facility. Clinical Social Worker will take case at this time.  Will assist if needed.   Expected Discharge Date:  08/10/17               Expected Discharge Plan:     In-House Referral:   yes  Discharge planning Services     Post Acute Care Choice:    Choice offered to:     DME Arranged:    DME Agency:     HH Arranged:    HH Agency:     Status of Service:     If discussed at [employer], dates discussed:    Additional Comments:  Shelbie Ammons, RN MSN CCM Care Management [phone] 08/10/2017, 12:47 PM

## 2017-08-10 NOTE — Progress Notes (Signed)
Physical Therapy Treatment Patient Details Name: Dana Perez MRN: [id number] DOB: May 20, 1941 Today's Date: 08/10/2017    History of Present Illness Pt is a26 y.o.femalewith a known history of stage IV head and neck cancer receiving radiation 08/07/17. When she got home she felt very weak and fell out of the car injuring both ankles. She denies any headache or dizziness, no loss of consciousness or incontinence. The patient complained of bilateral footpain. X-rays show bilateral ankle fractures.  Pt is now s/p open reduction and internal fixation of the lefttrimalleolarankle fracture (medial and lateral).    PT Comments    Pt in bed, ready for session but reports incontinence.  Rolling without assist and use of rail for small BM.  Upon sitting edge of bed with use of rail and supervision, she stated she needed the commode.  Transferred to commode with min a x 1 and was able to maintain WB status of RLE WBAT and LLE NWB.  She had a small/medium BM on commode and nurse tech was in to provide care as she requires +2 assist for safety for standing/care.  She was able to stand for an extended amount of time for care.  She then transferred 180 degrees to recliner.  She did need some vc's to maintain proper WB status but overall did well.  She is unable to fully hop on RLE but scoots on her foot to turn.  She does require verbal cues for safety as she will try to sit before fully turned to recliner.  Discussed transfer status, assist level and WB status with nurse tech.   Follow Up Recommendations  SNF     Equipment Recommendations  Rolling walker with 5" wheels;Other (comment)    Recommendations for Other Services       Precautions / Restrictions Precautions Precautions: Fall Required Braces or Orthoses: Other Brace/Splint Other Brace/Splint: airsplint to RLE at all times, WBAT.  LLE NWB Restrictions Weight Bearing Restrictions: Yes RLE Weight Bearing: Weight bearing as  tolerated LLE Weight Bearing: Non weight bearing    Mobility  Bed Mobility Overal bed mobility: Modified Independent Bed Mobility: Supine to Sit;Sit to Supine     Supine to sit: Supervision Sit to supine: Supervision   General bed mobility comments: increased time and use of rails  Transfers Overall transfer level: Needs assistance Equipment used: Rolling walker (2 wheeled) Transfers: Sit to/from Stand Sit to Stand: Min assist         General transfer comment: cues for weight bearing with standing  Ambulation/Gait Ambulation/Gait assistance: Min assist Ambulation Distance (Feet): 3 Feet Assistive device: Rolling walker (2 wheeled) Gait Pattern/deviations: Decreased step length - right   Gait velocity interpretation: <1.8 ft/sec, indicative of risk for recurrent falls General Gait Details: pivot steps on RLE to and from commode/recliner.  Unable to "hop"     Stairs            Wheelchair Mobility    Modified Rankin (Stroke Patients Only)       Balance Overall balance assessment: Needs assistance Sitting-balance support: Feet unsupported;Bilateral upper extremity supported Sitting balance-Leahy Scale: Good     Standing balance support: Bilateral upper extremity supported Standing balance-Leahy Scale: Poor Standing balance comment: requires support for standing and transfers.                            Cognition Arousal/Alertness: Awake/alert Behavior During Therapy: WFL for tasks assessed/performed Overall Cognitive Status: Within Functional Limits  for tasks assessed                                        Exercises Other Exercises Other Exercises: rolling left and rigth for incontinence care without assist Other Exercises: to comode at bedside for BM.  +2 assist for care.  Unsafe to attempt with +1 asssit.  Nurse tech in to help    General Comments        Pertinent Vitals/Pain Pain Assessment: 0-10 Pain Score: 5   Pain Location: LLE ankle Pain Descriptors / Indicators: Aching Pain Intervention(s): Limited activity within patient's tolerance;Monitored during session;Ice applied    Home Living Family/patient expects to be discharged to:: Private residence Living Arrangements: Spouse/significant other Available Help at Discharge: Family;Available 24 hours/day Type of Home: House Home Access: Stairs to enter Entrance Stairs-Rails: Right;Left Home Layout: One level Home Equipment: None      Prior Function Level of Independence: Independent      Comments: Pt Ind with amb without AD limited community distances, Ind with ADLs, no other history of falls   PT Goals (current goals can now be found in the care plan section) Acute Rehab PT Goals Patient Stated Goal: wants to be able to walk again Progress towards PT goals: Progressing toward goals    Frequency    BID      PT Plan Current plan remains appropriate    Co-evaluation              AM-PAC PT "6 Clicks" Daily Activity  Outcome Measure  Difficulty turning over in bed (including adjusting bedclothes, sheets and blankets)?: A Little Difficulty moving from lying on back to sitting on the side of the bed? : A Little Difficulty sitting down on and standing up from a chair with arms (e.g., wheelchair, bedside commode, etc,.)?: Unable Help needed moving to and from a bed to chair (including a wheelchair)?: A Lot Help needed walking in hospital room?: Total Help needed climbing 3-5 steps with a railing? : Total 6 Click Score: 11    End of Session Equipment Utilized During Treatment: Gait belt Activity Tolerance: Patient tolerated treatment well Patient left: in chair;with chair alarm set;with call bell/phone within reach;with family/visitor present Nurse Communication: Mobility status;Weight bearing status;Precautions       Time: 4401-0272 PT Time Calculation (min) (ACUTE ONLY): 34 min  Charges:  $Gait Training: 8-22  mins $Therapeutic Activity: 8-22 mins                    G Codes:       Dana Perez, PTA 08/10/17, 10:31 AM

## 2017-08-10 NOTE — Discharge Summary (Signed)
[place] [facility] [place] NAME: Dana Perez    MR#:  [id number]  DATE OF BIRTH:  01-25-1941  DATE OF ADMISSION:  08/07/2017   ADMITTING PHYSICIAN: Demetrios Loll, MD  DATE OF DISCHARGE: No discharge date for patient encounter.  PRIMARY CARE PHYSICIAN: Glendon Axe, MD   ADMISSION DIAGNOSIS:  Dehydration [E86.0] Fracture [T14.8XXA] Weakness [R53.1] Closed fracture of both ankles, initial encounter [S82.891A, S82.892A] DISCHARGE DIAGNOSIS:  Active Problems:   Closed left ankle fracture   Malnutrition of moderate degree  SECONDARY DIAGNOSIS:   Past Medical History:  Diagnosis Date  . Adenocarcinoma of colon ([place])   . Benign neoplasm of ascending colon   . Essential hypertension 06/13/2014  . Goals of care, counseling/discussion 07/14/2017  . HLD (hyperlipidemia) 06/13/2014  . Hypercholesteremia   . Hyperlipidemia   . Multiple thyroid nodules 01/22/2012  . Osteoporosis   . Polycythemia, secondary 12/26/2014  . Pure hypercholesterolemia 03/15/2015  . Thyroid nodule    HOSPITAL COURSE:  Bilateral ankle fractures. OPEN REDUCTION INTERNAL FIXATION (ORIF) ANKLE POD2. DVT prophylaxis with lovenox for 14 days. PT:SNF.  Hypertension. Continue home Norvasc.  Stage IV head and neck cancer. Continue dexamethasone.Follow-up Dr. Grayland Ormond as outpatient.  Tobacco abuse. Smoking cessation was counseled for 3-4 minutes. DISCHARGE CONDITIONS:  Stable, discharge to SNF today. CONSULTS OBTAINED:  Treatment Team:  Dereck Leep, MD DRUG ALLERGIES:   Allergies  Allergen Reactions  . Other Other (See Comments) and Rash    TDAP   DISCHARGE MEDICATIONS:   Allergies as of 08/10/2017      Reactions   Other Other (See Comments), Rash   TDAP      Medication List    TAKE these medications   amLODipine 10 MG tablet Commonly known as:  NORVASC Take 1 tablet by mouth daily.   dexamethasone 4 MG tablet Commonly known as:   DECADRON Take 1 tablet (4 mg total) by mouth daily.   enoxaparin 40 MG/0.4ML injection Commonly known as:  LOVENOX Inject 0.4 mLs (40 mg total) into the skin daily.   ondansetron 8 MG tablet Commonly known as:  ZOFRAN Take 1 tablet (8 mg total) 2 (two) times daily as needed by mouth.   oxyCODONE 5 MG immediate release tablet Commonly known as:  Oxy IR/ROXICODONE Take 1-2 tablets (5-10 mg total) by mouth every 6 (six) hours as needed for moderate pain or breakthrough pain ((for MODERATE breakthrough pain)).   pravastatin 10 MG tablet Commonly known as:  PRAVACHOL Take 10 mg by mouth daily.   prochlorperazine 10 MG tablet Commonly known as:  COMPAZINE Take 1 tablet (10 mg total) every 6 (six) hours as needed by mouth (Nausea or vomiting).   sucralfate 1 g tablet Commonly known as:  CARAFATE Take 1 tablet (1 g total) by mouth 3 (three) times daily. Dissolve in 2-3 tbsp warm water, swish and swallow. What changed:    when to take this  additional instructions        DISCHARGE INSTRUCTIONS:  See AVS  If you experience worsening of your admission symptoms, develop shortness of breath, life threatening emergency, suicidal or homicidal thoughts you must seek medical attention immediately by calling 911 or calling your MD immediately  if symptoms less severe.  You Must read complete instructions/literature along with all the possible adverse reactions/side effects for all the Medicines you take and that have been prescribed to you. Take any new Medicines after you have completely understood and accpet all the  possible adverse reactions/side effects.   Please note  You were cared for by a hospitalist during your hospital stay. If you have any questions about your discharge medications or the care you received while you were in the hospital after you are discharged, you can call the unit and asked to speak with the hospitalist on call if the hospitalist that took care of you is not  available. Once you are discharged, your primary care physician will handle any further medical issues. Please note that NO REFILLS for any discharge medications will be authorized once you are discharged, as it is imperative that you return to your primary care physician (or establish a relationship with a primary care physician if you do not have one) for your aftercare needs so that they can reassess your need for medications and monitor your lab values.    On the day of Discharge:  VITAL SIGNS:  Blood pressure (!) 151/61, pulse 77, temperature 98.4 F (36.9 C), temperature source Oral, resp. rate 16, height 5\' 4"  (1.626 m), weight 118 lb (53.5 kg), SpO2 97 %. PHYSICAL EXAMINATION:  GENERAL:  76 y.o.-year-old patient lying in the bed with no acute distress.  EYES: Pupils equal, round, reactive to light and accommodation. No scleral icterus. Extraocular muscles intact.  HEENT: Head atraumatic, normocephalic. Oropharynx and nasopharynx clear.  NECK:  Supple, no jugular venous distention. No thyroid enlargement, no tenderness.  LUNGS: Normal breath sounds bilaterally, no wheezing, rales,rhonchi or crepitation. No use of accessory muscles of respiration.  CARDIOVASCULAR: S1, S2 normal. No murmurs, rubs, or gallops.  ABDOMEN: Soft, non-tender, non-distended. Bowel sounds present. No organomegaly or mass.  EXTREMITIES: No pedal edema, cyanosis, or clubbing.  NEUROLOGIC: Cranial nerves II through XII are intact. Muscle strength 3-4/5 in all extremities. Sensation intact. Gait not checked.  PSYCHIATRIC: The patient is alert and oriented x 3.  SKIN: No obvious rash, lesion, or ulcer.  DATA REVIEW:   CBC Recent Labs  Lab 08/09/17 0335  WBC 8.0  HGB 11.1*  HCT 33.0*  PLT 128*    Chemistries  Recent Labs  Lab 08/07/17 1214 08/09/17 0335  NA 137 134*  K 3.5 3.6  CL 103 103  CO2 26 24  GLUCOSE 96 108*  BUN 18 11  CREATININE 1.04* 0.83  CALCIUM 8.5* 7.9*  AST 22  --   ALT 19  --     ALKPHOS 70  --   BILITOT 0.4  --      Microbiology Results  Results for orders placed or performed during the hospital encounter of 08/07/17  Surgical PCR screen     Status: None   Collection Time: 08/07/17  6:49 PM  Result Value Ref Range Status   MRSA, PCR NEGATIVE NEGATIVE Final   Staphylococcus aureus NEGATIVE NEGATIVE Final    Comment: (NOTE) The Xpert SA Assay (FDA approved for NASAL specimens in patients 2 years of age and older), is one component of a comprehensive surveillance program. It is not intended to diagnose infection nor to guide or monitor treatment. Performed at [facility], [address], [place], [place] [address]     RADIOLOGY:  No results found.   Management plans discussed with the patient, family and they are in agreement.  CODE STATUS: Full Code   TOTAL TIME TAKING CARE OF THIS PATIENT: 33 minutes.    Demetrios Loll M.D on 08/10/2017 at 8:54 AM  Between 7am to 6pm - Pager - [phone]  After 6pm go to [email or web address] -  password [email or web address] [facility]  [facility]  Office  [phone]  CC: Primary care physician; Glendon Axe, MD   Note: This dictation was prepared with Dragon dictation along with smaller phrase technology. Any transcriptional errors that result from this process are unintentional.

## 2017-08-10 NOTE — Progress Notes (Signed)
ORTHOPAEDICS PROGRESS NOTE  PATIENT NAME: Dana Perez DOB: Apr 01, 1941  MRN: [id number]  POD # 2: Open reduction and internal fixation of a left trimalleolar ankle fracture  Subjective: The patient is in good spirits today.  Pain is under good control. She was just fitted with an Aircast splint on the right ankle by Staci Righter [employer]).  Objective: Vital signs in last 24 hours: Temp:  [97.7 F (36.5 C)-98.6 F (37 C)] 98.6 F (37 C) (12/24 0401) Pulse Rate:  [65-98] 86 (12/24 0401) Resp:  [18-19] 19 (12/24 0401) BP: (97-159)/(47-80) 159/64 (12/24 0401) SpO2:  [95 %-97 %] 96 % (12/24 0401)  Intake/Output from previous day: 12/23 0701 - 12/24 0700 In: 360 [P.O.:360] Out: -   Recent Labs    08/07/17 1214 08/08/17 0357 08/09/17 0335  WBC 10.9 7.5 8.0  HGB 12.8 10.9* 11.1*  HCT 38.3 31.8* 33.0*  PLT 176 142* 128*  K 3.5  --  3.6  CL 103  --  103  CO2 26  --  24  BUN 18  --  11  CREATININE 1.04*  --  0.83  GLUCOSE 96  --  108*  CALCIUM 8.5*  --  7.9*  INR 0.92  --   --     EXAM General: Pleasant female seen in no apparent discomfort. Right lower extremity: Aircast splint in place.  Good capillary refill.  Minimal swelling. Left lower extremity: Posterior splint in place.  Good capillary refill. Neurologic: Awake, alert, and oriented. Sensory and motor function are grossly intact.   Assessment: ORIF of a left trimalleolar ankle fracture Right ankle sprain  Secondary diagnoses: Adenocarcinoma of the colon Squamous cell carcinoma of the head and neck Hypercholesterolemia Polycythemia Osteoporosis Hypertension  Plan: Continue with physical therapy for transfers.  The patient may be weightbearing as tolerated to the right lower extremity with the Aircast splint in place.  Nonweightbearing to the left lower extremity. Continue with the Polar Care cooling devices bilaterally. Chemotherapy will be deferred for approximately 2 weeks as per Dr.  Grayland Ormond. Radiation therapy to the head and neck as per Dr. Baruch Gouty. Plan is to go Skilled nursing facility after hospital stay. DVT Prophylaxis - Lovenox and SCDs  James P. Holley Bouche M.D.

## 2017-08-10 NOTE — Discharge Instructions (Signed)
°  Instructions for Ankle Fracture   Dana Perez., M.D.     Dept. of [facility]  [address], [place]  [address]   Phone: [phone]   Fax: [phone]   ACTIVITY:   You may use crutches or a walker with NO weight-bearing to the affected leg  WOUND CARE:   Place one to two pillows under the left foot and ankle when sitting or lying.   Continue to use the ice packs or the Polar Care to reduce pain and swelling.  Keep the left splint clean and dry.  MEDICATIONS:  You may resume your regular medications.  Please take the pain medication as prescribed.  Do not take pain medication on an empty stomach.  Do not drive or drink alcoholic beverages when taking pain medications.  CALL THE OFFICE FOR:  Temperature above 101 degrees  Excessive bleeding or drainage on the dressing.  Excessive swelling, coldness, or paleness of the toes.  Persistent nausea and vomiting.  FOLLOW-UP:   You should have an appointment to return to the office in 7-10 days.

## 2017-08-12 ENCOUNTER — Ambulatory Visit: Payer: PPO

## 2017-08-12 DIAGNOSIS — R110 Nausea: Secondary | ICD-10-CM | POA: Diagnosis not present

## 2017-08-12 DIAGNOSIS — Z7901 Long term (current) use of anticoagulants: Secondary | ICD-10-CM | POA: Diagnosis not present

## 2017-08-12 DIAGNOSIS — S82852D Displaced trimalleolar fracture of left lower leg, subsequent encounter for closed fracture with routine healing: Secondary | ICD-10-CM | POA: Diagnosis not present

## 2017-08-12 DIAGNOSIS — C760 Malignant neoplasm of head, face and neck: Secondary | ICD-10-CM | POA: Diagnosis not present

## 2017-08-12 DIAGNOSIS — E785 Hyperlipidemia, unspecified: Secondary | ICD-10-CM | POA: Diagnosis not present

## 2017-08-12 DIAGNOSIS — I10 Essential (primary) hypertension: Secondary | ICD-10-CM | POA: Diagnosis not present

## 2017-08-12 DIAGNOSIS — C182 Malignant neoplasm of ascending colon: Secondary | ICD-10-CM | POA: Diagnosis not present

## 2017-08-12 DIAGNOSIS — S92154D Nondisplaced avulsion fracture (chip fracture) of right talus, subsequent encounter for fracture with routine healing: Secondary | ICD-10-CM | POA: Diagnosis not present

## 2017-08-13 ENCOUNTER — Ambulatory Visit: Payer: PPO

## 2017-08-14 ENCOUNTER — Ambulatory Visit: Payer: PPO

## 2017-08-17 ENCOUNTER — Ambulatory Visit
Admission: RE | Admit: 2017-08-17 | Discharge: 2017-08-17 | Disposition: A | Discharge status: Home / Self Care | Payer: PPO | Source: Ambulatory Visit | Attending: Radiation Oncology | Admitting: Radiation Oncology

## 2017-08-17 DIAGNOSIS — F17210 Nicotine dependence, cigarettes, uncomplicated: Secondary | ICD-10-CM | POA: Diagnosis not present

## 2017-08-17 DIAGNOSIS — I10 Essential (primary) hypertension: Secondary | ICD-10-CM | POA: Diagnosis not present

## 2017-08-17 DIAGNOSIS — E041 Nontoxic single thyroid nodule: Secondary | ICD-10-CM | POA: Diagnosis not present

## 2017-08-17 DIAGNOSIS — E785 Hyperlipidemia, unspecified: Secondary | ICD-10-CM | POA: Diagnosis not present

## 2017-08-17 DIAGNOSIS — G893 Neoplasm related pain (acute) (chronic): Secondary | ICD-10-CM | POA: Diagnosis not present

## 2017-08-17 DIAGNOSIS — Z510 Encounter for antineoplastic radiation therapy: Secondary | ICD-10-CM | POA: Diagnosis not present

## 2017-08-17 DIAGNOSIS — Z79899 Other long term (current) drug therapy: Secondary | ICD-10-CM | POA: Diagnosis not present

## 2017-08-17 DIAGNOSIS — C760 Malignant neoplasm of head, face and neck: Secondary | ICD-10-CM | POA: Diagnosis not present

## 2017-08-17 DIAGNOSIS — E7800 Pure hypercholesterolemia, unspecified: Secondary | ICD-10-CM | POA: Diagnosis not present

## 2017-08-17 DIAGNOSIS — M810 Age-related osteoporosis without current pathological fracture: Secondary | ICD-10-CM | POA: Diagnosis not present

## 2017-08-19 ENCOUNTER — Ambulatory Visit
Admission: RE | Admit: 2017-08-19 | Discharge: 2017-08-19 | Disposition: A | Discharge status: Home / Self Care | Payer: PPO | Source: Ambulatory Visit | Attending: Radiation Oncology | Admitting: Radiation Oncology

## 2017-08-19 DIAGNOSIS — C760 Malignant neoplasm of head, face and neck: Secondary | ICD-10-CM | POA: Diagnosis not present

## 2017-08-19 DIAGNOSIS — Z510 Encounter for antineoplastic radiation therapy: Secondary | ICD-10-CM | POA: Diagnosis not present

## 2017-08-19 DIAGNOSIS — F17210 Nicotine dependence, cigarettes, uncomplicated: Secondary | ICD-10-CM | POA: Diagnosis not present

## 2017-08-20 ENCOUNTER — Inpatient Hospital Stay: Payer: PPO | Attending: Oncology

## 2017-08-20 ENCOUNTER — Ambulatory Visit
Admission: RE | Admit: 2017-08-20 | Discharge: 2017-08-20 | Disposition: A | Discharge status: Home / Self Care | Payer: PPO | Source: Ambulatory Visit | Attending: Radiation Oncology | Admitting: Radiation Oncology

## 2017-08-20 DIAGNOSIS — C760 Malignant neoplasm of head, face and neck: Secondary | ICD-10-CM | POA: Insufficient documentation

## 2017-08-20 DIAGNOSIS — C189 Malignant neoplasm of colon, unspecified: Secondary | ICD-10-CM | POA: Insufficient documentation

## 2017-08-20 DIAGNOSIS — R531 Weakness: Secondary | ICD-10-CM | POA: Insufficient documentation

## 2017-08-20 DIAGNOSIS — R5383 Other fatigue: Secondary | ICD-10-CM | POA: Insufficient documentation

## 2017-08-20 DIAGNOSIS — Z510 Encounter for antineoplastic radiation therapy: Secondary | ICD-10-CM | POA: Diagnosis not present

## 2017-08-20 DIAGNOSIS — F17210 Nicotine dependence, cigarettes, uncomplicated: Secondary | ICD-10-CM | POA: Diagnosis not present

## 2017-08-20 DIAGNOSIS — Z5112 Encounter for antineoplastic immunotherapy: Secondary | ICD-10-CM | POA: Insufficient documentation

## 2017-08-20 NOTE — Progress Notes (Signed)
Nutrition Follow-up:  Patient with stage IV head and neck cancer and adenocarcinoma of colon.    Patient with recent hospitalization due to bilateral ankle fracture s/p ORIF on 12/22.  Patient currently at [facility].  Met with patient and husband today after radiation.  Husband reports patient appetite is better. Ate 1 piece of fish, some slaw, little baked potato and few hushpuppies for supper last night.  Reports she ate eggs and bacon for breakfast this am.  Patient is receiving boost on each meal tray.  Reports that she has not been really drinking it that much.  Has been participating in physical therapy and occupational therapy daily.    Medications: reviewed  Labs: reviewed  Anthropometrics:   Last weight in chart 118 lb on 12/21 (hospital admission).  Husband said they have not weighed her at [facility] that he is aware of  NUTRITION DIAGNOSIS: Inadequate oral intake improving   INTERVENTION:   Encouraged patient to try and drink oral nutrition supplement between meals and/or with taking medication for additional calories and protein.   Encouraged patient to continue to eat good sources of protein at every meal.  Patient verbalized understanding.     MONITORING, EVALUATION, GOAL: weight trends, intake   NEXT VISIT: Jan 14  Prithvi Kooi B. Zenia Resides, [place], [place] Registered Dietitian [phone] (pager)

## 2017-08-21 ENCOUNTER — Ambulatory Visit
Admission: RE | Admit: 2017-08-21 | Discharge: 2017-08-21 | Disposition: A | Discharge status: Home / Self Care | Payer: PPO | Source: Ambulatory Visit | Attending: Radiation Oncology | Admitting: Radiation Oncology

## 2017-08-21 DIAGNOSIS — S82852D Displaced trimalleolar fracture of left lower leg, subsequent encounter for closed fracture with routine healing: Secondary | ICD-10-CM | POA: Diagnosis not present

## 2017-08-21 DIAGNOSIS — Z510 Encounter for antineoplastic radiation therapy: Secondary | ICD-10-CM | POA: Diagnosis not present

## 2017-08-21 DIAGNOSIS — C182 Malignant neoplasm of ascending colon: Secondary | ICD-10-CM | POA: Diagnosis not present

## 2017-08-21 DIAGNOSIS — I10 Essential (primary) hypertension: Secondary | ICD-10-CM | POA: Diagnosis not present

## 2017-08-21 DIAGNOSIS — S92154D Nondisplaced avulsion fracture (chip fracture) of right talus, subsequent encounter for fracture with routine healing: Secondary | ICD-10-CM | POA: Diagnosis not present

## 2017-08-21 DIAGNOSIS — R110 Nausea: Secondary | ICD-10-CM | POA: Diagnosis not present

## 2017-08-21 DIAGNOSIS — E785 Hyperlipidemia, unspecified: Secondary | ICD-10-CM | POA: Diagnosis not present

## 2017-08-21 DIAGNOSIS — F17210 Nicotine dependence, cigarettes, uncomplicated: Secondary | ICD-10-CM | POA: Diagnosis not present

## 2017-08-21 DIAGNOSIS — C760 Malignant neoplasm of head, face and neck: Secondary | ICD-10-CM | POA: Diagnosis not present

## 2017-08-21 DIAGNOSIS — Z79891 Long term (current) use of opiate analgesic: Secondary | ICD-10-CM | POA: Diagnosis not present

## 2017-08-24 ENCOUNTER — Inpatient Hospital Stay: Payer: PPO

## 2017-08-24 ENCOUNTER — Ambulatory Visit: Payer: PPO

## 2017-08-24 ENCOUNTER — Ambulatory Visit: Payer: PPO | Admitting: Oncology

## 2017-08-24 ENCOUNTER — Ambulatory Visit
Admission: RE | Admit: 2017-08-24 | Discharge: 2017-08-24 | Disposition: A | Discharge status: Home / Self Care | Payer: PPO | Source: Ambulatory Visit | Attending: Radiation Oncology | Admitting: Radiation Oncology

## 2017-08-24 DIAGNOSIS — C760 Malignant neoplasm of head, face and neck: Secondary | ICD-10-CM | POA: Diagnosis not present

## 2017-08-24 DIAGNOSIS — F17210 Nicotine dependence, cigarettes, uncomplicated: Secondary | ICD-10-CM | POA: Diagnosis not present

## 2017-08-24 DIAGNOSIS — Z510 Encounter for antineoplastic radiation therapy: Secondary | ICD-10-CM | POA: Diagnosis not present

## 2017-08-25 ENCOUNTER — Ambulatory Visit
Admission: RE | Admit: 2017-08-25 | Discharge: 2017-08-25 | Disposition: A | Discharge status: Home / Self Care | Payer: PPO | Source: Ambulatory Visit | Attending: Radiation Oncology | Admitting: Radiation Oncology

## 2017-08-25 DIAGNOSIS — Z510 Encounter for antineoplastic radiation therapy: Secondary | ICD-10-CM | POA: Diagnosis not present

## 2017-08-25 DIAGNOSIS — C760 Malignant neoplasm of head, face and neck: Secondary | ICD-10-CM | POA: Diagnosis not present

## 2017-08-25 DIAGNOSIS — F17210 Nicotine dependence, cigarettes, uncomplicated: Secondary | ICD-10-CM | POA: Diagnosis not present

## 2017-08-26 ENCOUNTER — Ambulatory Visit
Admission: RE | Admit: 2017-08-26 | Discharge: 2017-08-26 | Disposition: A | Discharge status: Home / Self Care | Payer: PPO | Source: Ambulatory Visit | Attending: Radiation Oncology | Admitting: Radiation Oncology

## 2017-08-26 DIAGNOSIS — F17210 Nicotine dependence, cigarettes, uncomplicated: Secondary | ICD-10-CM | POA: Diagnosis not present

## 2017-08-26 DIAGNOSIS — Z510 Encounter for antineoplastic radiation therapy: Secondary | ICD-10-CM | POA: Diagnosis not present

## 2017-08-26 DIAGNOSIS — C760 Malignant neoplasm of head, face and neck: Secondary | ICD-10-CM | POA: Diagnosis not present

## 2017-08-27 ENCOUNTER — Ambulatory Visit
Admission: RE | Admit: 2017-08-27 | Discharge: 2017-08-27 | Disposition: A | Discharge status: Home / Self Care | Payer: PPO | Source: Ambulatory Visit | Attending: Radiation Oncology | Admitting: Radiation Oncology

## 2017-08-27 DIAGNOSIS — Z510 Encounter for antineoplastic radiation therapy: Secondary | ICD-10-CM | POA: Diagnosis not present

## 2017-08-27 DIAGNOSIS — C760 Malignant neoplasm of head, face and neck: Secondary | ICD-10-CM | POA: Diagnosis not present

## 2017-08-27 DIAGNOSIS — M25571 Pain in right ankle and joints of right foot: Secondary | ICD-10-CM | POA: Diagnosis not present

## 2017-08-27 DIAGNOSIS — F17210 Nicotine dependence, cigarettes, uncomplicated: Secondary | ICD-10-CM | POA: Diagnosis not present

## 2017-08-27 DIAGNOSIS — M25572 Pain in left ankle and joints of left foot: Secondary | ICD-10-CM | POA: Diagnosis not present

## 2017-08-28 ENCOUNTER — Ambulatory Visit
Admission: RE | Admit: 2017-08-28 | Discharge: 2017-08-28 | Disposition: A | Discharge status: Home / Self Care | Payer: PPO | Source: Ambulatory Visit | Attending: Radiation Oncology | Admitting: Radiation Oncology

## 2017-08-28 ENCOUNTER — Other Ambulatory Visit: Payer: Self-pay | Admitting: Oncology

## 2017-08-28 DIAGNOSIS — C760 Malignant neoplasm of head, face and neck: Secondary | ICD-10-CM | POA: Diagnosis not present

## 2017-08-28 DIAGNOSIS — F17210 Nicotine dependence, cigarettes, uncomplicated: Secondary | ICD-10-CM | POA: Diagnosis not present

## 2017-08-28 DIAGNOSIS — Z510 Encounter for antineoplastic radiation therapy: Secondary | ICD-10-CM | POA: Diagnosis not present

## 2017-08-29 ENCOUNTER — Ambulatory Visit: Payer: PPO

## 2017-08-29 DIAGNOSIS — I10 Essential (primary) hypertension: Secondary | ICD-10-CM | POA: Diagnosis not present

## 2017-08-29 DIAGNOSIS — C182 Malignant neoplasm of ascending colon: Secondary | ICD-10-CM | POA: Diagnosis not present

## 2017-08-29 DIAGNOSIS — S92154D Nondisplaced avulsion fracture (chip fracture) of right talus, subsequent encounter for fracture with routine healing: Secondary | ICD-10-CM | POA: Diagnosis not present

## 2017-08-29 DIAGNOSIS — E785 Hyperlipidemia, unspecified: Secondary | ICD-10-CM | POA: Diagnosis not present

## 2017-08-29 DIAGNOSIS — S82852D Displaced trimalleolar fracture of left lower leg, subsequent encounter for closed fracture with routine healing: Secondary | ICD-10-CM | POA: Diagnosis not present

## 2017-08-29 DIAGNOSIS — R110 Nausea: Secondary | ICD-10-CM | POA: Diagnosis not present

## 2017-08-29 DIAGNOSIS — C760 Malignant neoplasm of head, face and neck: Secondary | ICD-10-CM | POA: Diagnosis not present

## 2017-08-30 NOTE — Progress Notes (Signed)
[facility]  [phone] [phone]  ID: Dana Perez OB: 1940/10/18  MR#: [id number]  NTI#:[id number]  Patient Care Team: Glendon Axe, MD as PCP - General (Internal Medicine)  CHIEF COMPLAINT: Stage IVa head and neck squamous cell carcinoma, adenocarcinoma of the colon.  INTERVAL HISTORY: Patient returns to clinic today for further evaluation and discussion of reinitiating treatment.  She recently broke both of her ankles after a fall, requiring major reconstruction of her left ankle approximately 3-4 weeks ago.  She continues to have significant weakness and fatigue.  She is now just learning to re-walk.  She has a poor appetite and has had continued weight loss. She has no neurologic complaints. She has no chest pain or shortness of breath. She denies any constipation or diarrhea.  She has no melena or hematochezia.  She has no urinary complaints. Patient feels generally terrible, but offers no further specific complaints today.  REVIEW OF SYSTEMS:   Review of Systems  Constitutional: Positive for malaise/fatigue and weight loss. Negative for fever.  HENT: Negative.  Negative for sore throat.   Respiratory: Negative.  Negative for cough and shortness of breath.   Cardiovascular: Negative.  Negative for chest pain and leg swelling.  Gastrointestinal: Negative.  Negative for abdominal pain, constipation, nausea and vomiting.  Genitourinary: Negative.   Musculoskeletal: Positive for falls and joint pain.  Skin: Negative.  Negative for rash.  Neurological: Positive for weakness.  Psychiatric/Behavioral: Negative.  The patient is not nervous/anxious.     As per HPI. Otherwise, a complete review of systems is negative.  PAST MEDICAL HISTORY: Past Medical History:  Diagnosis Date  . Adenocarcinoma of colon ([place])   . Benign neoplasm of ascending colon   . Essential hypertension 06/13/2014  . Goals of care, counseling/discussion 07/14/2017    . HLD (hyperlipidemia) 06/13/2014  . Hypercholesteremia   . Hyperlipidemia   . Multiple thyroid nodules 01/22/2012  . Osteoporosis   . Polycythemia, secondary 12/26/2014  . Pure hypercholesterolemia 03/15/2015  . Thyroid nodule     PAST SURGICAL HISTORY: Past Surgical History:  Procedure Laterality Date  . ABDOMINAL HYSTERECTOMY    . COLONOSCOPY WITH PROPOFOL N/A 07/02/2017   Procedure: COLONOSCOPY WITH PROPOFOL;  Surgeon: Lucilla Lame, MD;  Location: [facility];  Service: Endoscopy;  Laterality: N/A;  . ORIF ANKLE FRACTURE Left 08/08/2017   Procedure: OPEN REDUCTION INTERNAL FIXATION (ORIF) ANKLE FRACTURE;  Surgeon: Dereck Leep, MD;  Location: [facility];  Service: Orthopedics;  Laterality: Left;  . POLYPECTOMY N/A 07/02/2017   Procedure: POLYPECTOMY;  Surgeon: Lucilla Lame, MD;  Location: [facility];  Service: Endoscopy;  Laterality: N/A;  . WRIST FRACTURE SURGERY  08/2009   badly broken    FAMILY HISTORY Family History  Problem Relation Age of Onset  . Heart disease Mother   . Diabetes Father        ADVANCED DIRECTIVES:    HEALTH MAINTENANCE: Social History   Tobacco Use  . Smoking status: Current Every Day Smoker    Packs/day: 0.25    Types: Cigarettes  . Smokeless tobacco: Never Used  . Tobacco comment: occasional smoker  Substance Use Topics  . Alcohol use: No  . Drug use: No     Allergies  Allergen Reactions  . Other Other (See Comments) and Rash    TDAP    Current Outpatient Medications  Medication Sig Dispense Refill  . acetaminophen (TYLENOL) 500 MG tablet Take 500 mg by mouth every 6 (six) hours  as needed.    Marland Kitchen amLODipine (NORVASC) 10 MG tablet Take 1 tablet by mouth daily.  3  . dexamethasone (DECADRON) 4 MG tablet Take 1 tablet (4 mg total) by mouth daily. 30 tablet 0  . enoxaparin (LOVENOX) 40 MG/0.4ML injection Inject 0.4 mLs (40 mg total) into the skin daily. 0 Syringe   . ondansetron (ZOFRAN) 8 MG tablet Take 1 tablet (8  mg total) 2 (two) times daily as needed by mouth. 30 tablet 2  . pravastatin (PRAVACHOL) 10 MG tablet Take 10 mg by mouth daily.    . prochlorperazine (COMPAZINE) 10 MG tablet Take 1 tablet (10 mg total) every 6 (six) hours as needed by mouth (Nausea or vomiting). 60 tablet 2  . sucralfate (CARAFATE) 1 g tablet Take 1 tablet (1 g total) by mouth 3 (three) times daily. Dissolve in 2-3 tbsp warm water, swish and swallow. (Patient taking differently: Take 1 g by mouth 2 (two) times daily. Dissolve in 2-3 tbsp warm water, swish and swallow.) 90 tablet 3  . oxyCODONE (OXY IR/ROXICODONE) 5 MG immediate release tablet Take 1-2 tablets (5-10 mg total) by mouth every 6 (six) hours as needed for moderate pain or breakthrough pain ((for MODERATE breakthrough pain)). (Patient not taking: Reported on 08/31/2017) 15 tablet 0   No current facility-administered medications for this visit.    Facility-Administered Medications Ordered in Other Visits  Medication Dose Route Frequency Provider Last Rate Last Dose  . heparin lock flush 100 unit/mL  250 Units Intracatheter PRN Leia Alf, MD      . heparin lock flush 100 unit/mL  500 Units Intracatheter PRN Leia Alf, MD      . sodium chloride 0.9 % injection 10 mL  10 mL Intracatheter PRN Leia Alf, MD      . sodium chloride 0.9 % injection 10 mL  10 mL Intracatheter PRN Ma Hillock, Sandeep, MD      . sodium chloride 0.9 % injection 10 mL  10 mL Intracatheter PRN Ma Hillock, Sandeep, MD      . sodium chloride 0.9 % injection 10 mL  10 mL Intracatheter PRN Leia Alf, MD      -year-old  OBJECTIVE: Vitals:   08/31/17 0856  BP: (!) 91/59  Pulse: 96  Resp: 18  Temp: 97.9 F (36.6 C)     There is no height or weight on file to calculate BMI.    ECOG FS:0 - Asymptomatic  General: Well-developed, well-nourished, no acute distress. Eyes: Pink conjunctiva, anicteric sclera. HEENT: Easily palpable lymph node in right cervical chain, slightly decreased in  size.   Lungs: Clear to auscultation bilaterally. Heart: Regular rate and rhythm. No rubs, murmurs, or gallops. Abdomen: Soft, nontender, nondistended. No organomegaly noted, normoactive bowel sounds. Musculoskeletal: No edema, cyanosis, or clubbing. Neuro: Alert, answering all questions appropriately. Cranial nerves grossly intact. Skin: No rashes or petechiae noted. Psych: Normal affect.   LAB RESULTS:  Lab Results  Component Value Date   NA 135 08/31/2017   K 4.0 08/31/2017   CL 103 08/31/2017   CO2 22 08/31/2017   GLUCOSE 104 (H) 08/31/2017   BUN 19 08/31/2017   CREATININE 1.04 (H) 08/31/2017   CALCIUM 8.7 (L) 08/31/2017   PROT 5.9 (L) 08/07/2017   ALBUMIN 3.5 08/07/2017   AST 22 08/07/2017   ALT 19 08/07/2017   ALKPHOS 70 08/07/2017   BILITOT 0.4 08/07/2017   GFRNONAA 52 (L) 08/31/2017   GFRAA 60 (L) 08/31/2017    Lab Results  Component Value  Date   WBC 5.4 08/31/2017   NEUTROABS 4.5 08/31/2017   HGB 12.5 08/31/2017   HCT 37.2 08/31/2017   MCV 93.7 08/31/2017   PLT 249 08/31/2017     STUDIES: Dg Ankle 2 Views Left  Result Date: 08/07/2017 CLINICAL DATA:  Post reduction trimalleolar left ankle fracture-dislocation. EXAM: LEFT ANKLE - 2 VIEW 2:17 p.m.: COMPARISON:  Left ankle x-rays earlier today at 11:04 a.m. FINDINGS: Anatomic alignment of the ankle mortise post reduction. Significant improvement in alignment of the trimalleolar fractures post reduction. IMPRESSION: 1. Anatomic alignment of the ankle joint post reduction. 2. Significant improvement in alignment of the trimalleolar fractures post reduction. Electronically Signed   By: Evangeline Dakin M.D.   On: 08/07/2017 14:35   Dg Ankle Complete Left  Result Date: 08/07/2017 CLINICAL DATA:  Left ankle pain and swelling after fall today. EXAM: LEFT ANKLE COMPLETE - 3+ VIEW COMPARISON:  None. FINDINGS: There is severe posterior dislocation of talus relative to distal tibia, with severely displaced fractures  involving the distal fibula and posterior malleolus. No definite soft tissue abnormality is noted. IMPRESSION: Severe posterior dislocation of talus relative to distal tibia, with severely displaced fractures of the distal fibula and posterior malleolus. Electronically Signed   By: Marijo Conception, M.D.   On: 08/07/2017 11:38   Dg Ankle Complete Right  Result Date: 08/07/2017 CLINICAL DATA:  77 year old who fell while getting out of her car earlier today after receiving radiation therapy for head neck cancer (squamous cell carcinoma), sustaining a right ankle injury. Pain and swelling. Initial encounter. EXAM: RIGHT ANKLE - COMPLETE 3+ VIEW COMPARISON:  None. FINDINGS: Avulsion fracture fragment between the lateral malleolus and the lateral talus, which I believe arises from the talus. Small avulsion fracture involving the medial malleolus. No fractures elsewhere. Ankle mortise intact with well-preserved joint space. Osseous demineralization. Joint effusion/hemarthrosis. Diffuse soft tissue swelling, worst laterally. IMPRESSION: 1. Avulsion fracture involving the lateral aspect of the talus at the insertion of the talofibular ligament. 2. Avulsion fracture involving the medial malleolus. 3. Ankle mortise intact.  No evidence of subluxation. 4. Osseous demineralization. Electronically Signed   By: Evangeline Dakin M.D.   On: 08/07/2017 11:39    ASSESSMENT: Stage IVa head and neck squamous cell carcinoma, adenocarcinoma of the colon  PLAN:    1. 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  Patient was also discussed with ENT as well as at cancer conference.  She will benefit from concurrent chemotherapy using weekly cisplatin along with daily XRT.  Continue daily XRT.  Given patient's poor performance status, difficulty with cisplatin, and continued need for wound healing from her ankle surgery will discontinue cisplatin at this time and patient  will initiate weekly Erbitux for the remainder of her treatments along with XRT.  Return to clinic in 1 week to initiate cycle 1 of treatment.  Plan to reimage 3 months after the completion of treatment with PET scan.   2.  Adenocarcinoma of the colon: Appreciate surgical input.  Patient has requested to delay surgery or treatment until after her head and neck treatments are complete.  CEA is within normal limits. 3.  Fractured ankles: Continue follow-up and treatment per orthopedics. 4.  Decreased performance status: Discontinue cisplatin as above.   Approximately 30 minutes was spent in discussion of which greater than 50% was consultation.   [name], MD 09/01/17 12:58 PM

## 2017-08-31 ENCOUNTER — Ambulatory Visit
Admission: RE | Admit: 2017-08-31 | Discharge: 2017-08-31 | Disposition: A | Discharge status: Home / Self Care | Payer: PPO | Source: Ambulatory Visit | Attending: Radiation Oncology | Admitting: Radiation Oncology

## 2017-08-31 ENCOUNTER — Inpatient Hospital Stay (HOSPITAL_BASED_OUTPATIENT_CLINIC_OR_DEPARTMENT_OTHER): Payer: PPO | Admitting: Oncology

## 2017-08-31 ENCOUNTER — Inpatient Hospital Stay: Payer: PPO

## 2017-08-31 VITALS — BP 91/59 | HR 96 | Temp 97.9°F | Resp 18

## 2017-08-31 DIAGNOSIS — C4442 Squamous cell carcinoma of skin of scalp and neck: Secondary | ICD-10-CM

## 2017-08-31 DIAGNOSIS — Z5112 Encounter for antineoplastic immunotherapy: Secondary | ICD-10-CM | POA: Diagnosis not present

## 2017-08-31 DIAGNOSIS — R531 Weakness: Secondary | ICD-10-CM | POA: Diagnosis not present

## 2017-08-31 DIAGNOSIS — C189 Malignant neoplasm of colon, unspecified: Secondary | ICD-10-CM | POA: Diagnosis not present

## 2017-08-31 DIAGNOSIS — R5383 Other fatigue: Secondary | ICD-10-CM

## 2017-08-31 DIAGNOSIS — C760 Malignant neoplasm of head, face and neck: Secondary | ICD-10-CM

## 2017-08-31 DIAGNOSIS — Z510 Encounter for antineoplastic radiation therapy: Secondary | ICD-10-CM | POA: Diagnosis not present

## 2017-08-31 DIAGNOSIS — F17210 Nicotine dependence, cigarettes, uncomplicated: Secondary | ICD-10-CM | POA: Diagnosis not present

## 2017-08-31 LAB — CBC WITH DIFFERENTIAL/PLATELET
BASOS ABS: 0 10*3/uL (ref 0–0.1)
BASOS PCT: 0 %
EOS PCT: 0 %
Eosinophils Absolute: 0 10*3/uL (ref 0–0.7)
HCT: 37.2 % (ref 35.0–47.0)
Hemoglobin: 12.5 g/dL (ref 12.0–16.0)
Lymphocytes Relative: 9 %
Lymphs Abs: 0.5 10*3/uL — ABNORMAL LOW (ref 1.0–3.6)
MCH: 31.5 pg (ref 26.0–34.0)
MCHC: 33.6 g/dL (ref 32.0–36.0)
MCV: 93.7 fL (ref 80.0–100.0)
MONO ABS: 0.4 10*3/uL (ref 0.2–0.9)
Monocytes Relative: 8 %
Neutro Abs: 4.5 10*3/uL (ref 1.4–6.5)
Neutrophils Relative %: 83 %
PLATELETS: 249 10*3/uL (ref 150–440)
RBC: 3.97 MIL/uL (ref 3.80–5.20)
RDW: 16.7 % — ABNORMAL HIGH (ref 11.5–14.5)
WBC: 5.4 10*3/uL (ref 3.6–11.0)

## 2017-08-31 LAB — BASIC METABOLIC PANEL
ANION GAP: 10 (ref 5–15)
BUN: 19 mg/dL (ref 6–20)
CALCIUM: 8.7 mg/dL — AB (ref 8.9–10.3)
CO2: 22 mmol/L (ref 22–32)
Chloride: 103 mmol/L (ref 101–111)
Creatinine, Ser: 1.04 mg/dL — ABNORMAL HIGH (ref 0.44–1.00)
GFR calc Af Amer: 60 mL/min — ABNORMAL LOW (ref >60)
GFR, EST NON AFRICAN AMERICAN: 52 mL/min — AB (ref >60)
GLUCOSE: 104 mg/dL — AB (ref 65–99)
Potassium: 4 mmol/L (ref 3.5–5.1)
SODIUM: 135 mmol/L (ref 135–145)

## 2017-08-31 NOTE — Progress Notes (Signed)
Nutrition Follow-up:  Patient with stage IV head and neck cancer and adenocarcinoma of colon.  Patient followed by Dr. Grayland Ormond.  Patient continues to be at [facility] for rehab after bilateral ankle fracture s/p ORIF on 12/22.  Husband reports appetite has been better since not receiving chemotherapy.  Patient reports dry mouth and some times sore throat.  Husband reports has not really been drinking oral nutrition supplement shakes but being provided at [facility].  Does enjoy the ice cream and recently had a chicken gumbo that she enjoyed.   Husband reports that MD is changing chemotherapy to oral pill, not getting treatment today with less side effects.    Medications: reviewed  Labs: reviewed  Anthropometrics:   Husband reports patient was weighed at [facility] and was 119 lb 8 oz recently.    NUTRITION DIAGNOSIS: Inadequate oral intake improving   INTERVENTION:   Discussed strategies to help with dry mouth.  Fact sheet provided.   Encouraged patient to continue to eat high calorie, high protein foods to help with healing, rehab and weight gain.  Patient wants to get back home.    MONITORING, EVALUATION, GOAL: weight trends, intake   NEXT VISIT: Jan 24 after radiation  Dana Perez, [place], [place] Registered Dietitian [phone] (pager)

## 2017-09-01 ENCOUNTER — Ambulatory Visit
Admission: RE | Admit: 2017-09-01 | Discharge: 2017-09-01 | Disposition: A | Discharge status: Home / Self Care | Payer: PPO | Source: Ambulatory Visit | Attending: Radiation Oncology | Admitting: Radiation Oncology

## 2017-09-01 DIAGNOSIS — F17210 Nicotine dependence, cigarettes, uncomplicated: Secondary | ICD-10-CM | POA: Diagnosis not present

## 2017-09-01 DIAGNOSIS — C760 Malignant neoplasm of head, face and neck: Secondary | ICD-10-CM | POA: Diagnosis not present

## 2017-09-01 DIAGNOSIS — Z510 Encounter for antineoplastic radiation therapy: Secondary | ICD-10-CM | POA: Diagnosis not present

## 2017-09-02 ENCOUNTER — Ambulatory Visit
Admission: RE | Admit: 2017-09-02 | Discharge: 2017-09-02 | Disposition: A | Discharge status: Home / Self Care | Payer: PPO | Source: Ambulatory Visit | Attending: Radiation Oncology | Admitting: Radiation Oncology

## 2017-09-02 DIAGNOSIS — C760 Malignant neoplasm of head, face and neck: Secondary | ICD-10-CM | POA: Diagnosis not present

## 2017-09-02 DIAGNOSIS — Z510 Encounter for antineoplastic radiation therapy: Secondary | ICD-10-CM | POA: Diagnosis not present

## 2017-09-02 DIAGNOSIS — F17210 Nicotine dependence, cigarettes, uncomplicated: Secondary | ICD-10-CM | POA: Diagnosis not present

## 2017-09-03 ENCOUNTER — Encounter: Payer: Self-pay | Admitting: Surgery

## 2017-09-03 ENCOUNTER — Ambulatory Visit (INDEPENDENT_AMBULATORY_CARE_PROVIDER_SITE_OTHER): Payer: PPO | Admitting: Surgery

## 2017-09-03 ENCOUNTER — Ambulatory Visit
Admission: RE | Admit: 2017-09-03 | Discharge: 2017-09-03 | Disposition: A | Discharge status: Home / Self Care | Payer: PPO | Source: Ambulatory Visit | Attending: Radiation Oncology | Admitting: Radiation Oncology

## 2017-09-03 VITALS — BP 96/66 | HR 104 | Temp 98.2°F | Ht 64.0 in | Wt 119.8 lb

## 2017-09-03 DIAGNOSIS — C182 Malignant neoplasm of ascending colon: Secondary | ICD-10-CM

## 2017-09-03 DIAGNOSIS — F17210 Nicotine dependence, cigarettes, uncomplicated: Secondary | ICD-10-CM | POA: Diagnosis not present

## 2017-09-03 DIAGNOSIS — Z510 Encounter for antineoplastic radiation therapy: Secondary | ICD-10-CM | POA: Diagnosis not present

## 2017-09-03 DIAGNOSIS — C760 Malignant neoplasm of head, face and neck: Secondary | ICD-10-CM | POA: Diagnosis not present

## 2017-09-03 NOTE — Progress Notes (Signed)
Mrs Dana Perez is a patient with metastatic squamous cell carcinoma to the neck receiving radiation and chemotherapy. She also has concomitant adenocarcinoma of the right side for: That was recently discovered after polypectomy. She has had a rocky course after instillation of chemotherapy and radiation therapy. She recently less than 2 weeks ago suffered a fall causing bilateral ankle fractures that required surgery. She is currently in a wheelchair and doing physical therapy. She still malnourished and debilitated.  She has some dysphagia due to the radiation to the neck  PE : debilitated and malnourished pt in NAD In a wheelchair Abd: soft nt  A/P patient with adenocarcinoma currently undergoing chemotherapy and radiation for metastatic squamous cell carcinoma of the neck. Currently she is very debilitated and malnourished along with new fractures of both ankles incapacitating her at this time. She is not a good surgical candidate at this time I will see her back in 6 weeks and reevaluate the patient. If she has had improvement in her overall status and do think that is reasonable to schedule a right hemicolectomy. Currently she still has ways to go until she is medically optimized. I have spent at least 25 minutes in this encounter with greater than 50% spent in counseling and coordination of her care

## 2017-09-03 NOTE — Patient Instructions (Signed)
We will see you back in office as listed below, however if there is anything that we can do or any questions or concerns that you may have please give our office a call.

## 2017-09-04 ENCOUNTER — Ambulatory Visit: Payer: PPO

## 2017-09-05 ENCOUNTER — Ambulatory Visit: Payer: PPO

## 2017-09-06 NOTE — Progress Notes (Unsigned)
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  INTERVAL HISTORY: Patient returns to clinic today for further evaluation and discussion of reinitiating treatment.  She recently broke both of her ankles after a fall, requiring major reconstruction of her left ankle approximately 3-4 weeks ago.  She continues to have significant weakness and fatigue.  She is now just learning to re-walk.  She has a poor appetite and has had continued weight loss. She has no neurologic complaints. She has no chest pain or shortness of breath. She denies any constipation or diarrhea.  She has no melena or hematochezia.  She has no urinary complaints. Patient feels generally terrible, but offers no further specific complaints today.  REVIEW OF SYSTEMS:   Review of Systems  Constitutional: Positive for malaise/fatigue and weight loss. Negative for fever.  HENT: Negative.  Negative for sore throat.   Respiratory: Negative.  Negative for cough and shortness of breath.   Cardiovascular: Negative.  Negative for chest pain and leg swelling.  Gastrointestinal: Negative.  Negative for abdominal pain, constipation, nausea and vomiting.  Genitourinary: Negative.   Musculoskeletal: Positive for falls and joint pain.  Skin: Negative.  Negative for rash.  Neurological: Positive for weakness.  Psychiatric/Behavioral: Negative.  The patient is not nervous/anxious.     As per HPI. Otherwise, a complete review of systems is negative.  PAST MEDICAL HISTORY: Past Medical History:  Diagnosis Date  . Adenocarcinoma of colon ([place])   . Benign neoplasm of ascending colon   . Essential hypertension 06/13/2014  . Goals of care, counseling/discussion 07/14/2017    . HLD (hyperlipidemia) 06/13/2014  . Hypercholesteremia   . Hyperlipidemia   . Multiple thyroid nodules 01/22/2012  . Osteoporosis   . Polycythemia, secondary 12/26/2014  . Pure hypercholesterolemia 03/15/2015  . Thyroid nodule     PAST SURGICAL HISTORY: Past Surgical History:  Procedure Laterality Date  . ABDOMINAL HYSTERECTOMY    . COLONOSCOPY WITH PROPOFOL N/A 07/02/2017   Procedure: COLONOSCOPY WITH PROPOFOL;  Surgeon: Lucilla Lame, MD;  Location: [facility];  Service: Endoscopy;  Laterality: N/A;  . ORIF ANKLE FRACTURE Left 08/08/2017   Procedure: OPEN REDUCTION INTERNAL FIXATION (ORIF) ANKLE FRACTURE;  Surgeon: Dereck Leep, MD;  Location: [facility];  Service: Orthopedics;  Laterality: Left;  . POLYPECTOMY N/A 07/02/2017   Procedure: POLYPECTOMY;  Surgeon: Lucilla Lame, MD;  Location: [place] [facility];  Service: Endoscopy;  Laterality: N/A;  . WRIST FRACTURE SURGERY  08/2009   badly broken    FAMILY HISTORY Family History  Problem Relation Age of Onset  . Heart disease Mother   . Diabetes Father        ADVANCED DIRECTIVES:    HEALTH MAINTENANCE: Social History   Tobacco Use  . Smoking status: Current Every Day Smoker    Packs/day: 0.25    Types: Cigarettes  . Smokeless tobacco: Never Used  . Tobacco comment: occasional smoker  Substance Use Topics  . Alcohol use: No  . Drug use: No     Allergies  Allergen Reactions  . Other Other (See Comments) and Rash    TDAP    Current Outpatient Medications  Medication Sig Dispense Refill  . acetaminophen (TYLENOL) 500 MG tablet Take 500 mg by mouth every 6 (six) hours  as needed.    Marland Kitchen amLODipine (NORVASC) 10 MG tablet Take 1 tablet by mouth daily.  3  . dexamethasone (DECADRON) 4 MG tablet Take 1 tablet (4 mg total) by mouth daily. 30 tablet 0  . enoxaparin (LOVENOX) 40 MG/0.4ML injection Inject 0.4 mLs (40 mg total) into the skin daily. 0 Syringe   . ondansetron (ZOFRAN) 8 MG tablet TAKE 1 TABLET (8  MG TOTAL) 2 (TWO) TIMES DAILY AS NEEDED BY MOUTH.  2  . oxyCODONE (OXY IR/ROXICODONE) 5 MG immediate release tablet Take 1-2 tablets (5-10 mg total) by mouth every 6 (six) hours as needed for moderate pain or breakthrough pain ((for MODERATE breakthrough pain)). 15 tablet 0  . pravastatin (PRAVACHOL) 10 MG tablet Take 10 mg by mouth daily.    . prochlorperazine (COMPAZINE) 10 MG tablet TAKE 1 TABLET (10 MG TOTAL) EVERY 6 (SIX) HOURS AS NEEDED BY MOUTH (NAUSEA OR VOMITING).  2  . sucralfate (CARAFATE) 1 g tablet Take 1 tablet (1 g total) by mouth 3 (three) times daily. Dissolve in 2-3 tbsp warm water, swish and swallow. (Patient taking differently: Take 1 g by mouth 2 (two) times daily. Dissolve in 2-3 tbsp warm water, swish and swallow.) 90 tablet 3   No current facility-administered medications for this visit.    Facility-Administered Medications Ordered in Other Visits  Medication Dose Route Frequency Provider Last Rate Last Dose  . heparin lock flush 100 unit/mL  250 Units Intracatheter PRN Leia Alf, MD      . heparin lock flush 100 unit/mL  500 Units Intracatheter PRN Leia Alf, MD      . sodium chloride 0.9 % injection 10 mL  10 mL Intracatheter PRN Leia Alf, MD      . sodium chloride 0.9 % injection 10 mL  10 mL Intracatheter PRN Leia Alf, MD      . sodium chloride 0.9 % injection 10 mL  10 mL Intracatheter PRN Ma Hillock, Sandeep, MD      . sodium chloride 0.9 % injection 10 mL  10 mL Intracatheter PRN Leia Alf, MD      -year-old  OBJECTIVE: There were no vitals filed for this visit.   There is no height or weight on file to calculate BMI.    ECOG FS:0 - Asymptomatic  General: Well-developed, well-nourished, no acute distress. Eyes: Pink conjunctiva, anicteric sclera. HEENT: Easily palpable lymph node in right cervical chain, slightly decreased in size.   Lungs: Clear to auscultation bilaterally. Heart: Regular rate and rhythm. No rubs, murmurs, or  gallops. Abdomen: Soft, nontender, nondistended. No organomegaly noted, normoactive bowel sounds. Musculoskeletal: No edema, cyanosis, or clubbing. Neuro: Alert, answering all questions appropriately. Cranial nerves grossly intact. Skin: No rashes or petechiae noted. Psych: Normal affect.   LAB RESULTS:  Lab Results  Component Value Date   NA 135 08/31/2017   K 4.0 08/31/2017   CL 103 08/31/2017   CO2 22 08/31/2017   GLUCOSE 104 (H) 08/31/2017   BUN 19 08/31/2017   CREATININE 1.04 (H) 08/31/2017   CALCIUM 8.7 (L) 08/31/2017   PROT 5.9 (L) 08/07/2017   ALBUMIN 3.5 08/07/2017   AST 22 08/07/2017   ALT 19 08/07/2017   ALKPHOS 70 08/07/2017   BILITOT 0.4 08/07/2017   GFRNONAA 52 (L) 08/31/2017   GFRAA 60 (L) 08/31/2017    Lab Results  Component Value Date   WBC 5.4 08/31/2017   NEUTROABS 4.5 08/31/2017   HGB 12.5 08/31/2017   HCT 37.2 08/31/2017  MCV 93.7 08/31/2017   PLT 249 08/31/2017     STUDIES: No results found.  ASSESSMENT: Stage IVa head and neck squamous cell carcinoma, adenocarcinoma of the colon  PLAN:    1. 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  Patient was also discussed with ENT as well as at cancer conference.  She will benefit from concurrent chemotherapy using weekly cisplatin along with daily XRT.  Continue daily XRT.  Given patient's poor performance status, difficulty with cisplatin, and continued need for wound healing from her ankle surgery will discontinue cisplatin at this time and patient will initiate weekly Erbitux for the remainder of her treatments along with XRT.  Return to clinic in 1 week to initiate cycle 1 of treatment.  Plan to reimage 3 months after the completion of treatment with PET scan.   2.  Adenocarcinoma of the colon: Appreciate surgical input.  Patient has requested to delay surgery or treatment until after her head and neck treatments are complete.  CEA is  within normal limits. 3.  Fractured ankles: Continue follow-up and treatment per orthopedics. 4.  Decreased performance status: Discontinue cisplatin as above.   Approximately 30 minutes was spent in discussion of which greater than 50% was consultation.   [name], MD 09/06/17 11:27 PM

## 2017-09-07 ENCOUNTER — Ambulatory Visit: Payer: PPO

## 2017-09-07 ENCOUNTER — Inpatient Hospital Stay: Payer: PPO | Admitting: Oncology

## 2017-09-07 ENCOUNTER — Other Ambulatory Visit: Payer: Self-pay | Admitting: Oncology

## 2017-09-07 ENCOUNTER — Inpatient Hospital Stay: Payer: PPO

## 2017-09-07 DIAGNOSIS — C760 Malignant neoplasm of head, face and neck: Secondary | ICD-10-CM

## 2017-09-08 ENCOUNTER — Ambulatory Visit: Payer: PPO

## 2017-09-08 ENCOUNTER — Ambulatory Visit
Admission: RE | Admit: 2017-09-08 | Discharge: 2017-09-08 | Disposition: A | Discharge status: Home / Self Care | Payer: PPO | Source: Ambulatory Visit | Attending: Radiation Oncology | Admitting: Radiation Oncology

## 2017-09-08 ENCOUNTER — Inpatient Hospital Stay: Payer: PPO | Admitting: Oncology

## 2017-09-08 ENCOUNTER — Inpatient Hospital Stay: Payer: PPO

## 2017-09-08 VITALS — BP 100/62 | HR 75 | Temp 96.4°F | Resp 18

## 2017-09-08 DIAGNOSIS — C760 Malignant neoplasm of head, face and neck: Secondary | ICD-10-CM | POA: Diagnosis not present

## 2017-09-08 DIAGNOSIS — R5383 Other fatigue: Secondary | ICD-10-CM | POA: Diagnosis not present

## 2017-09-08 DIAGNOSIS — F17210 Nicotine dependence, cigarettes, uncomplicated: Secondary | ICD-10-CM | POA: Diagnosis not present

## 2017-09-08 DIAGNOSIS — Z510 Encounter for antineoplastic radiation therapy: Secondary | ICD-10-CM | POA: Diagnosis not present

## 2017-09-08 DIAGNOSIS — R531 Weakness: Secondary | ICD-10-CM

## 2017-09-08 DIAGNOSIS — C189 Malignant neoplasm of colon, unspecified: Secondary | ICD-10-CM

## 2017-09-08 DIAGNOSIS — Z5112 Encounter for antineoplastic immunotherapy: Secondary | ICD-10-CM | POA: Diagnosis not present

## 2017-09-08 LAB — CBC WITH DIFFERENTIAL/PLATELET
Basophils Absolute: 0 10*3/uL (ref 0–0.1)
Basophils Relative: 0 %
Eosinophils Absolute: 0 10*3/uL (ref 0–0.7)
Eosinophils Relative: 0 %
HEMATOCRIT: 36.9 % (ref 35.0–47.0)
HEMOGLOBIN: 12.4 g/dL (ref 12.0–16.0)
LYMPHS ABS: 0.6 10*3/uL — AB (ref 1.0–3.6)
LYMPHS PCT: 7 %
MCH: 31.2 pg (ref 26.0–34.0)
MCHC: 33.7 g/dL (ref 32.0–36.0)
MCV: 92.6 fL (ref 80.0–100.0)
Monocytes Absolute: 0.4 10*3/uL (ref 0.2–0.9)
Monocytes Relative: 4 %
NEUTROS PCT: 89 %
Neutro Abs: 7.8 10*3/uL — ABNORMAL HIGH (ref 1.4–6.5)
Platelets: 255 10*3/uL (ref 150–440)
RBC: 3.98 MIL/uL (ref 3.80–5.20)
RDW: 16.8 % — ABNORMAL HIGH (ref 11.5–14.5)
WBC: 8.8 10*3/uL (ref 3.6–11.0)

## 2017-09-08 LAB — BASIC METABOLIC PANEL
Anion gap: 10 (ref 5–15)
BUN: 17 mg/dL (ref 6–20)
CHLORIDE: 99 mmol/L — AB (ref 101–111)
CO2: 23 mmol/L (ref 22–32)
Calcium: 8.3 mg/dL — ABNORMAL LOW (ref 8.9–10.3)
Creatinine, Ser: 0.88 mg/dL (ref 0.44–1.00)
GFR calc Af Amer: >60 mL/min (ref >60)
GFR calc non Af Amer: >60 mL/min (ref >60)
GLUCOSE: 106 mg/dL — AB (ref 65–99)
Potassium: 4.4 mmol/L (ref 3.5–5.1)
Sodium: 132 mmol/L — ABNORMAL LOW (ref 135–145)

## 2017-09-08 LAB — MAGNESIUM: Magnesium: 1.8 mg/dL (ref 1.7–2.4)

## 2017-09-08 MED ORDER — CETUXIMAB CHEMO IV INJECTION 200 MG/100ML
400.0000 mg/m2 | Freq: Once | INTRAVENOUS | Status: AC
  Administered 2017-09-08: 600 mg via INTRAVENOUS
  Filled 2017-09-08: qty 300

## 2017-09-08 MED ORDER — SODIUM CHLORIDE 0.9 % IV SOLN
Freq: Once | INTRAVENOUS | Status: AC
  Administered 2017-09-08: 11:00:00 via INTRAVENOUS
  Filled 2017-09-08: qty 1000

## 2017-09-08 MED ORDER — DIPHENHYDRAMINE HCL 50 MG/ML IJ SOLN
25.0000 mg | Freq: Once | INTRAMUSCULAR | Status: AC
  Administered 2017-09-08: 25 mg via INTRAVENOUS
  Filled 2017-09-08: qty 1

## 2017-09-08 NOTE — Progress Notes (Signed)
[place]  Telephone:(336) [phone] Fax:(336) [phone]  ID: Dana Perez OB: 10/19/1940  MR#: [id number]  TOI#:[id number]  Patient Care Team: Glendon Axe, MD as PCP - General (Internal Medicine)  CHIEF COMPLAINT: Stage IVa head and neck squamous cell carcinoma, adenocarcinoma of the colon.  INTERVAL HISTORY: Patient returns to clinic today for further evaluation and consideration of cycle 1 of Erbitux.  She continues to improve on a weekly basis after requiring major reconstruction of her left ankle approximately 3-4 weeks ago.  She continues to have significant weakness and fatigue but this is improved as well. She has a poor appetite and has had continued weight loss. She has no neurologic complaints. She has no chest pain or shortness of breath. She denies any constipation or diarrhea.  She has no melena or hematochezia.  She has no urinary complaints. Patient offers no further specific complaints today.  REVIEW OF SYSTEMS:   Review of Systems  Constitutional: Positive for malaise/fatigue and weight loss. Negative for fever.  HENT: Negative.  Negative for sore throat.   Respiratory: Negative.  Negative for cough and shortness of breath.   Cardiovascular: Negative.  Negative for chest pain and leg swelling.  Gastrointestinal: Negative.  Negative for abdominal pain, constipation, nausea and vomiting.  Genitourinary: Negative.   Musculoskeletal: Positive for falls and joint pain.  Skin: Negative.  Negative for rash.  Neurological: Positive for weakness.  Psychiatric/Behavioral: Negative.  The patient is not nervous/anxious.     As per HPI. Otherwise, a complete review of systems is negative.  PAST MEDICAL HISTORY: Past Medical History:  Diagnosis Date  . Adenocarcinoma of colon ([place])   . Benign neoplasm of ascending colon   . Essential hypertension 06/13/2014  . Goals of care, counseling/discussion 07/14/2017  . HLD (hyperlipidemia) 06/13/2014  .  Hypercholesteremia   . Hyperlipidemia   . Multiple thyroid nodules 01/22/2012  . Osteoporosis   . Polycythemia, secondary 12/26/2014  . Pure hypercholesterolemia 03/15/2015  . Thyroid nodule     PAST SURGICAL HISTORY: Past Surgical History:  Procedure Laterality Date  . ABDOMINAL HYSTERECTOMY    . COLONOSCOPY WITH PROPOFOL N/A 07/02/2017   Procedure: COLONOSCOPY WITH PROPOFOL;  Surgeon: Lucilla Lame, MD;  Location: [place];  Service: Endoscopy;  Laterality: N/A;  . ORIF ANKLE FRACTURE Left 08/08/2017   Procedure: OPEN REDUCTION INTERNAL FIXATION (ORIF) ANKLE FRACTURE;  Surgeon: Dereck Leep, MD;  Location: [facility];  Service: Orthopedics;  Laterality: Left;  . POLYPECTOMY N/A 07/02/2017   Procedure: POLYPECTOMY;  Surgeon: Lucilla Lame, MD;  Location: [facility];  Service: Endoscopy;  Laterality: N/A;  . WRIST FRACTURE SURGERY  08/2009   badly broken    FAMILY HISTORY Family History  Problem Relation Age of Onset  . Heart disease Mother   . Diabetes Father        ADVANCED DIRECTIVES:    HEALTH MAINTENANCE: Social History   Tobacco Use  . Smoking status: Current Every Day Smoker    Packs/day: 0.25    Types: Cigarettes  . Smokeless tobacco: Never Used  . Tobacco comment: occasional smoker  Substance Use Topics  . Alcohol use: No  . Drug use: No     Allergies  Allergen Reactions  . Other Other (See Comments) and Rash    TDAP    Current Outpatient Medications  Medication Sig Dispense Refill  . acetaminophen (TYLENOL) 500 MG tablet Take 500 mg by mouth every 6 (six) hours as needed.    Marland Kitchen amLODipine (  NORVASC) 10 MG tablet Take 1 tablet by mouth daily.  3  . dexamethasone (DECADRON) 4 MG tablet Take 1 tablet (4 mg total) by mouth daily. 30 tablet 0  . enoxaparin (LOVENOX) 40 MG/0.4ML injection Inject 0.4 mLs (40 mg total) into the skin daily. 0 Syringe   . ondansetron (ZOFRAN) 8 MG tablet TAKE 1 TABLET (8 MG TOTAL) 2 (TWO) TIMES DAILY AS NEEDED  BY MOUTH.  2  . oxyCODONE (OXY IR/ROXICODONE) 5 MG immediate release tablet Take 1-2 tablets (5-10 mg total) by mouth every 6 (six) hours as needed for moderate pain or breakthrough pain ((for MODERATE breakthrough pain)). 15 tablet 0  . pravastatin (PRAVACHOL) 10 MG tablet Take 10 mg by mouth daily.    . prochlorperazine (COMPAZINE) 10 MG tablet TAKE 1 TABLET (10 MG TOTAL) EVERY 6 (SIX) HOURS AS NEEDED BY MOUTH (NAUSEA OR VOMITING).  2  . sucralfate (CARAFATE) 1 g tablet Take 1 tablet (1 g total) by mouth 3 (three) times daily. Dissolve in 2-3 tbsp warm water, swish and swallow. (Patient taking differently: Take 1 g by mouth 2 (two) times daily. Dissolve in 2-3 tbsp warm water, swish and swallow.) 90 tablet 3   No current facility-administered medications for this visit.    Facility-Administered Medications Ordered in Other Visits  Medication Dose Route Frequency Provider Last Rate Last Dose  . 0.9 %  sodium chloride infusion   Intravenous Once [name], MD      . cetuximab (ERBITUX) chemo infusion 600 mg  400 mg/m2 (Order-Specific) Intravenous Once [name], MD      . diphenhydrAMINE (BENADRYL) injection 25 mg  25 mg Intravenous Once [name], MD      . heparin lock flush 100 unit/mL  250 Units Intracatheter PRN Leia Alf, MD      . heparin lock flush 100 unit/mL  500 Units Intracatheter PRN Leia Alf, MD      . sodium chloride 0.9 % injection 10 mL  10 mL Intracatheter PRN Leia Alf, MD      . sodium chloride 0.9 % injection 10 mL  10 mL Intracatheter PRN Leia Alf, MD      . sodium chloride 0.9 % injection 10 mL  10 mL Intracatheter PRN Ma Hillock, Sandeep, MD      . sodium chloride 0.9 % injection 10 mL  10 mL Intracatheter PRN Leia Alf, MD      -year-old  OBJECTIVE: Vitals:   09/08/17 0923  BP: 100/62  Pulse: 75  Resp: 18  Temp: (!) 96.4 F (35.8 C)     There is no height or weight on file to calculate BMI.    ECOG FS:0 -  Asymptomatic  General: Well-developed, well-nourished, no acute distress. Eyes: Pink conjunctiva, anicteric sclera. HEENT: Easily palpable lymph node in right cervical chain, slightly decreased in size.   Lungs: Clear to auscultation bilaterally. Heart: Regular rate and rhythm. No rubs, murmurs, or gallops. Abdomen: Soft, nontender, nondistended. No organomegaly noted, normoactive bowel sounds. Musculoskeletal: No edema, cyanosis, or clubbing. Neuro: Alert, answering all questions appropriately. Cranial nerves grossly intact. Skin: No rashes or petechiae noted. Psych: Normal affect.   LAB RESULTS:  Lab Results  Component Value Date   NA 132 (L) 09/08/2017   K 4.4 09/08/2017   CL 99 (L) 09/08/2017   CO2 23 09/08/2017   GLUCOSE 106 (H) 09/08/2017   BUN 17 09/08/2017   CREATININE 0.88 09/08/2017   CALCIUM 8.3 (L) 09/08/2017   PROT  5.9 (L) 08/07/2017   ALBUMIN 3.5 08/07/2017   AST 22 08/07/2017   ALT 19 08/07/2017   ALKPHOS 70 08/07/2017   BILITOT 0.4 08/07/2017   GFRNONAA >60 09/08/2017   GFRAA >60 09/08/2017    Lab Results  Component Value Date   WBC 8.8 09/08/2017   NEUTROABS 7.8 (H) 09/08/2017   HGB 12.4 09/08/2017   HCT 36.9 09/08/2017   MCV 92.6 09/08/2017   PLT 255 09/08/2017     STUDIES: No results found.  ASSESSMENT: Stage IVa head and neck squamous cell carcinoma, adenocarcinoma of the colon  PLAN:    1. 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  Patient was also discussed with ENT as well as at cancer conference.  Because of wound healing from her ankle surgery, will discontinue cisplatin and proceed with cycle 1 of Erbitux today.  Continue daily XRT completing on September 24, 2017.  Return to clinic in 1 week for consideration of cycle 2 of Erbitux.  Plan to reimage 3 months after the completion of treatment with PET scan.   2.  Adenocarcinoma of the colon: Appreciate surgical input.  Patient  has requested to delay surgery or treatment until after her head and neck treatments are complete.  CEA is within normal limits. 3.  Fractured ankles: Continue follow-up and treatment per orthopedics. 4.  Decreased performance status: Discontinue cisplatin as above.   Approximately 30 minutes was spent in discussion of which greater than 50% was consultation.   [name], MD 09/08/17 10:34 AM

## 2017-09-08 NOTE — Progress Notes (Signed)
Patient is here today for follow up, she is doing well no complaints. The husband wanted to know the name of the chemo pill that was mentioned before.

## 2017-09-09 ENCOUNTER — Ambulatory Visit: Payer: PPO

## 2017-09-09 DIAGNOSIS — C760 Malignant neoplasm of head, face and neck: Secondary | ICD-10-CM | POA: Diagnosis not present

## 2017-09-09 DIAGNOSIS — Z510 Encounter for antineoplastic radiation therapy: Secondary | ICD-10-CM | POA: Diagnosis not present

## 2017-09-09 DIAGNOSIS — F17210 Nicotine dependence, cigarettes, uncomplicated: Secondary | ICD-10-CM | POA: Diagnosis not present

## 2017-09-10 ENCOUNTER — Ambulatory Visit: Payer: PPO

## 2017-09-10 DIAGNOSIS — R110 Nausea: Secondary | ICD-10-CM | POA: Diagnosis not present

## 2017-09-10 DIAGNOSIS — C182 Malignant neoplasm of ascending colon: Secondary | ICD-10-CM | POA: Diagnosis not present

## 2017-09-10 DIAGNOSIS — C760 Malignant neoplasm of head, face and neck: Secondary | ICD-10-CM | POA: Diagnosis not present

## 2017-09-10 DIAGNOSIS — E785 Hyperlipidemia, unspecified: Secondary | ICD-10-CM | POA: Diagnosis not present

## 2017-09-10 DIAGNOSIS — S82852D Displaced trimalleolar fracture of left lower leg, subsequent encounter for closed fracture with routine healing: Secondary | ICD-10-CM | POA: Diagnosis not present

## 2017-09-10 DIAGNOSIS — S92154D Nondisplaced avulsion fracture (chip fracture) of right talus, subsequent encounter for fracture with routine healing: Secondary | ICD-10-CM | POA: Diagnosis not present

## 2017-09-11 ENCOUNTER — Ambulatory Visit: Payer: PPO

## 2017-09-11 ENCOUNTER — Other Ambulatory Visit: Payer: Self-pay | Admitting: *Deleted

## 2017-09-11 NOTE — Patient Outreach (Signed)
[facility]) Care Management  09/11/2017  Dana Perez [facility] 03/24/1941 [id number]  Received referral from [facility];  Reason: patient discharging via [facility] for SNF due to WB status; still has significant needss including daily radiation for treatment for cancer. Husband is primary caregiver. Recent hospital stay.   Telephone call to [facility]. Advised that inpatient at facility in skilled section. Telephone call to discharge planner; left message requesting call back.   Plan: Refer to care management assistant to assign to appropriate Clinical Social Worker. Patient at [facility]=Skilled facility in [place], [place]. Telephonic signing off.   Dana Daisy, RN BSN [place] Management Coordinator [facility] Care Management  [phone]

## 2017-09-13 NOTE — Progress Notes (Signed)
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  INTERVAL HISTORY: Patient returns to clinic today for further evaluation and consideration of cycle 2 of Erbitux.  She tolerated her first infusion well without significant side effects.  She continues to improve after requiring major reconstruction of her left ankle.  Her appetite is improved and she is now gaining weight.  She continues to have significant weakness and fatigue. She has no neurologic complaints. She has no chest pain or shortness of breath. She denies any constipation or diarrhea.  She has no melena or hematochezia.  She has no urinary complaints. Patient offers no further specific complaints today.  REVIEW OF SYSTEMS:   Review of Systems  Constitutional: Positive for malaise/fatigue. Negative for fever and weight loss.  HENT: Negative.  Negative for sore throat.   Respiratory: Negative.  Negative for cough and shortness of breath.   Cardiovascular: Negative.  Negative for chest pain and leg swelling.  Gastrointestinal: Negative.  Negative for abdominal pain, constipation, nausea and vomiting.  Genitourinary: Negative.   Musculoskeletal: Positive for falls and joint pain.  Skin: Negative.  Negative for rash.  Neurological: Positive for weakness. Negative for sensory change.  Psychiatric/Behavioral: Negative.  The patient is not nervous/anxious.     As per HPI. Otherwise, a complete review of systems is negative.  PAST MEDICAL HISTORY: Past Medical History:  Diagnosis Date  . Adenocarcinoma of colon ([place])   . Benign neoplasm of ascending colon   . Essential hypertension 06/13/2014  .  Goals of care, counseling/discussion 07/14/2017  . HLD (hyperlipidemia) 06/13/2014  . Hypercholesteremia   . Hyperlipidemia   . Multiple thyroid nodules 01/22/2012  . Osteoporosis   . Polycythemia, secondary 12/26/2014  . Pure hypercholesterolemia 03/15/2015  . Thyroid nodule     PAST SURGICAL HISTORY: Past Surgical History:  Procedure Laterality Date  . ABDOMINAL HYSTERECTOMY    . COLONOSCOPY WITH PROPOFOL N/A 07/02/2017   Procedure: COLONOSCOPY WITH PROPOFOL;  Surgeon: Lucilla Lame, MD;  Location: [facility];  Service: Endoscopy;  Laterality: N/A;  . ORIF ANKLE FRACTURE Left 08/08/2017   Procedure: OPEN REDUCTION INTERNAL FIXATION (ORIF) ANKLE FRACTURE;  Surgeon: Dereck Leep, MD;  Location: [facility];  Service: Orthopedics;  Laterality: Left;  . POLYPECTOMY N/A 07/02/2017   Procedure: POLYPECTOMY;  Surgeon: Lucilla Lame, MD;  Location: [facility];  Service: Endoscopy;  Laterality: N/A;  . WRIST FRACTURE SURGERY  08/2009   badly broken    FAMILY HISTORY Family History  Problem Relation Age of Onset  . Heart disease Mother   . Diabetes Father        ADVANCED DIRECTIVES:    HEALTH MAINTENANCE: Social History   Tobacco Use  . Smoking status: Current Every Day Smoker    Packs/day: 0.25    Types: Cigarettes  . Smokeless tobacco: Never Used  . Tobacco comment: occasional smoker  Substance Use Topics  . Alcohol use: No  . Drug use: No     Allergies  Allergen Reactions  . Other Other (See Comments) and Rash    TDAP    Current Outpatient Medications  Medication Sig Dispense Refill  . acetaminophen (TYLENOL) 500 MG tablet Take 500 mg by mouth  every 6 (six) hours as needed.    Marland Kitchen amLODipine (NORVASC) 10 MG tablet Take 1 tablet by mouth daily.  3  . dexamethasone (DECADRON) 4 MG tablet Take 1 tablet (4 mg total) by mouth daily. 30 tablet 0  . enoxaparin (LOVENOX) 40 MG/0.4ML injection Inject 0.4 mLs (40 mg total) into the skin daily. 0 Syringe   .  ondansetron (ZOFRAN) 8 MG tablet TAKE 1 TABLET (8 MG TOTAL) 2 (TWO) TIMES DAILY AS NEEDED BY MOUTH.  2  . oxyCODONE (OXY IR/ROXICODONE) 5 MG immediate release tablet Take 1-2 tablets (5-10 mg total) by mouth every 6 (six) hours as needed for moderate pain or breakthrough pain ((for MODERATE breakthrough pain)). 15 tablet 0  . pravastatin (PRAVACHOL) 10 MG tablet Take 10 mg by mouth daily.    . prochlorperazine (COMPAZINE) 10 MG tablet TAKE 1 TABLET (10 MG TOTAL) EVERY 6 (SIX) HOURS AS NEEDED BY MOUTH (NAUSEA OR VOMITING).  2  . sucralfate (CARAFATE) 1 g tablet Take 1 tablet (1 g total) by mouth 3 (three) times daily. Dissolve in 2-3 tbsp warm water, swish and swallow. (Patient taking differently: Take 1 g by mouth 2 (two) times daily. Dissolve in 2-3 tbsp warm water, swish and swallow.) 90 tablet 3   No current facility-administered medications for this visit.    Facility-Administered Medications Ordered in Other Visits  Medication Dose Route Frequency Provider Last Rate Last Dose  . heparin lock flush 100 unit/mL  250 Units Intracatheter PRN Leia Alf, MD      . heparin lock flush 100 unit/mL  500 Units Intracatheter PRN Leia Alf, MD      . sodium chloride 0.9 % injection 10 mL  10 mL Intracatheter PRN Leia Alf, MD      . sodium chloride 0.9 % injection 10 mL  10 mL Intracatheter PRN Ma Hillock, Sandeep, MD      . sodium chloride 0.9 % injection 10 mL  10 mL Intracatheter PRN Ma Hillock, Sandeep, MD      . sodium chloride 0.9 % injection 10 mL  10 mL Intracatheter PRN Leia Alf, MD      -year-old  OBJECTIVE: Vitals:   09/15/17 1013  BP: (!) 95/59  Pulse: 74  Resp: 18  Temp: (!) 96.8 F (36 C)     There is no height or weight on file to calculate BMI.    ECOG FS:0 - Asymptomatic  General: Well-developed, well-nourished, no acute distress. Eyes: Pink conjunctiva, anicteric sclera. HEENT: Minimally palpable lymphadenopathy in left neck. Lungs: Clear to auscultation  bilaterally. Heart: Regular rate and rhythm. No rubs, murmurs, or gallops. Abdomen: Soft, nontender, nondistended. No organomegaly noted, normoactive bowel sounds. Musculoskeletal: No edema, cyanosis, or clubbing. Neuro: Alert, answering all questions appropriately. Cranial nerves grossly intact. Skin: No rashes or petechiae noted. Psych: Normal affect.   LAB RESULTS:  Lab Results  Component Value Date   NA 132 (L) 09/08/2017   K 4.4 09/08/2017   CL 99 (L) 09/08/2017   CO2 23 09/08/2017   GLUCOSE 106 (H) 09/08/2017   BUN 17 09/08/2017   CREATININE 0.88 09/08/2017   CALCIUM 8.3 (L) 09/08/2017   PROT 5.9 (L) 08/07/2017   ALBUMIN 3.5 08/07/2017   AST 22 08/07/2017   ALT 19 08/07/2017   ALKPHOS 70 08/07/2017   BILITOT 0.4 08/07/2017   GFRNONAA >60 09/08/2017   GFRAA >60 09/08/2017    Lab Results  Component Value Date   WBC 9.5 09/15/2017   NEUTROABS 8.1 (H) 09/15/2017  HGB 13.1 09/15/2017   HCT 38.8 09/15/2017   MCV 92.3 09/15/2017   PLT 360 09/15/2017     STUDIES: No results found.  ASSESSMENT: Stage IVa head and neck squamous cell carcinoma, adenocarcinoma of the colon  PLAN:    1. 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  Patient was also discussed with ENT as well as at cancer conference.  Because of wound healing from her ankle surgery, cisplatin was discontinued.  Proceed with cycle 2 of Erbitux today.  Continue daily XRT completing on September 24, 2017.  Return to clinic in 1 week for consideration of cycle 3 of Erbitux.  Plan to reimage 4-6 weeks after the completion of treatment with PET scan.   2.  Adenocarcinoma of the colon: Appreciate surgical input.  Patient has requested to delay surgery or treatment until after her head and neck treatments are complete.  CEA is within normal limits.  Patient states she has an appointment with surgery on October 28, 2017 for reevaluation. 3.  Fractured ankles:  Continue follow-up and treatment per orthopedics. 4.  Decreased performance status: Discontinue cisplatin as above.   Approximately 30 minutes was spent in discussion of which greater than 50% was consultation.   [name], MD 09/15/17 10:25 AM

## 2017-09-14 ENCOUNTER — Ambulatory Visit: Payer: PPO

## 2017-09-14 ENCOUNTER — Inpatient Hospital Stay: Payer: PPO

## 2017-09-14 ENCOUNTER — Ambulatory Visit
Admission: RE | Admit: 2017-09-14 | Discharge: 2017-09-14 | Disposition: A | Discharge status: Home / Self Care | Payer: PPO | Source: Ambulatory Visit | Attending: Radiation Oncology | Admitting: Radiation Oncology

## 2017-09-14 DIAGNOSIS — Z510 Encounter for antineoplastic radiation therapy: Secondary | ICD-10-CM | POA: Diagnosis not present

## 2017-09-14 DIAGNOSIS — C760 Malignant neoplasm of head, face and neck: Secondary | ICD-10-CM | POA: Diagnosis not present

## 2017-09-14 DIAGNOSIS — F17210 Nicotine dependence, cigarettes, uncomplicated: Secondary | ICD-10-CM | POA: Diagnosis not present

## 2017-09-14 NOTE — Progress Notes (Signed)
Nutrition Follow-up:  Patient with stage IV head and neck cancer and adenocarcinoma of colon.  Patient followed by Dr. Grayland Ormond.    Met with patient and husband today following radiation therapy.  Patient is still at [employer].  Husband reports that she is eating about 50-70% of meals (well-balanced).  Is not drinking oral nutrition supplement shake but continues to enjoy ice cream.  No issues with nausea per husband.  Taking carafate and helping with sore throat per husband.  Husband reports better tolerance to new chemotherapy than before.  Husband reports patient has 8 more radiation treatments to go and hoping to leave [facility] on 2/9 and come home.    Medications: reviewed  Labs: reviewed  Anthropometrics:  No new weight since 119 lb on 1/17.   Husband thinks patient has gained weight   NUTRITION DIAGNOSIS: Inadequate oral intake improving   INTERVENTION:   Reviewed high calorie, high protein foods for patient to continue to eat at SNF and home.   Contact information provided and encouraged patient to reach with questions  MONITORING, EVALUATION, GOAL: weight trends, intake   NEXT VISIT: as needed  Ugochukwu Chichester B. Zenia Resides, [place], [place] Registered Dietitian [phone] (pager)

## 2017-09-15 ENCOUNTER — Ambulatory Visit
Admission: RE | Admit: 2017-09-15 | Discharge: 2017-09-15 | Disposition: A | Discharge status: Home / Self Care | Payer: PPO | Source: Ambulatory Visit | Attending: Radiation Oncology | Admitting: Radiation Oncology

## 2017-09-15 ENCOUNTER — Inpatient Hospital Stay (HOSPITAL_BASED_OUTPATIENT_CLINIC_OR_DEPARTMENT_OTHER): Payer: PPO | Admitting: Oncology

## 2017-09-15 ENCOUNTER — Inpatient Hospital Stay: Payer: PPO

## 2017-09-15 ENCOUNTER — Encounter: Payer: Self-pay | Admitting: Oncology

## 2017-09-15 VITALS — BP 121/74 | HR 76 | Resp 20

## 2017-09-15 VITALS — BP 95/59 | HR 74 | Temp 96.8°F | Resp 18

## 2017-09-15 DIAGNOSIS — R5383 Other fatigue: Secondary | ICD-10-CM | POA: Diagnosis not present

## 2017-09-15 DIAGNOSIS — Z510 Encounter for antineoplastic radiation therapy: Secondary | ICD-10-CM | POA: Diagnosis not present

## 2017-09-15 DIAGNOSIS — C760 Malignant neoplasm of head, face and neck: Secondary | ICD-10-CM | POA: Diagnosis not present

## 2017-09-15 DIAGNOSIS — R531 Weakness: Secondary | ICD-10-CM

## 2017-09-15 DIAGNOSIS — C189 Malignant neoplasm of colon, unspecified: Secondary | ICD-10-CM

## 2017-09-15 DIAGNOSIS — F17210 Nicotine dependence, cigarettes, uncomplicated: Secondary | ICD-10-CM | POA: Diagnosis not present

## 2017-09-15 DIAGNOSIS — Z5112 Encounter for antineoplastic immunotherapy: Secondary | ICD-10-CM | POA: Diagnosis not present

## 2017-09-15 LAB — CBC WITH DIFFERENTIAL/PLATELET
BASOS ABS: 0 10*3/uL (ref 0–0.1)
Basophils Relative: 0 %
Eosinophils Absolute: 0 10*3/uL (ref 0–0.7)
Eosinophils Relative: 0 %
HEMATOCRIT: 38.8 % (ref 35.0–47.0)
HEMOGLOBIN: 13.1 g/dL (ref 12.0–16.0)
LYMPHS PCT: 8 %
Lymphs Abs: 0.7 10*3/uL — ABNORMAL LOW (ref 1.0–3.6)
MCH: 31.1 pg (ref 26.0–34.0)
MCHC: 33.7 g/dL (ref 32.0–36.0)
MCV: 92.3 fL (ref 80.0–100.0)
Monocytes Absolute: 0.6 10*3/uL (ref 0.2–0.9)
Monocytes Relative: 6 %
NEUTROS ABS: 8.1 10*3/uL — AB (ref 1.4–6.5)
NEUTROS PCT: 86 %
PLATELETS: 360 10*3/uL (ref 150–440)
RBC: 4.21 MIL/uL (ref 3.80–5.20)
RDW: 16.7 % — ABNORMAL HIGH (ref 11.5–14.5)
WBC: 9.5 10*3/uL (ref 3.6–11.0)

## 2017-09-15 LAB — MAGNESIUM: Magnesium: 1.7 mg/dL (ref 1.7–2.4)

## 2017-09-15 LAB — BASIC METABOLIC PANEL
Anion gap: 10 (ref 5–15)
BUN: 23 mg/dL — ABNORMAL HIGH (ref 6–20)
CHLORIDE: 100 mmol/L — AB (ref 101–111)
CO2: 22 mmol/L (ref 22–32)
Calcium: 8.9 mg/dL (ref 8.9–10.3)
Creatinine, Ser: 0.88 mg/dL (ref 0.44–1.00)
GFR calc non Af Amer: >60 mL/min (ref >60)
Glucose, Bld: 85 mg/dL (ref 65–99)
POTASSIUM: 3.6 mmol/L (ref 3.5–5.1)
Sodium: 132 mmol/L — ABNORMAL LOW (ref 135–145)

## 2017-09-15 MED ORDER — CETUXIMAB CHEMO IV INJECTION 200 MG/100ML
250.0000 mg/m2 | Freq: Once | INTRAVENOUS | Status: AC
  Administered 2017-09-15: 400 mg via INTRAVENOUS
  Filled 2017-09-15: qty 200

## 2017-09-15 MED ORDER — SODIUM CHLORIDE 0.9 % IV SOLN
Freq: Once | INTRAVENOUS | Status: AC
  Administered 2017-09-15: 11:00:00 via INTRAVENOUS
  Filled 2017-09-15: qty 1000

## 2017-09-15 MED ORDER — DIPHENHYDRAMINE HCL 50 MG/ML IJ SOLN
25.0000 mg | Freq: Once | INTRAMUSCULAR | Status: AC
  Administered 2017-09-15: 25 mg via INTRAVENOUS
  Filled 2017-09-15: qty 1

## 2017-09-15 NOTE — Progress Notes (Signed)
Patient denies any concerns today.  

## 2017-09-16 ENCOUNTER — Ambulatory Visit: Payer: PPO

## 2017-09-16 ENCOUNTER — Ambulatory Visit
Admission: RE | Admit: 2017-09-16 | Discharge: 2017-09-16 | Disposition: A | Discharge status: Home / Self Care | Payer: PPO | Source: Ambulatory Visit | Attending: Radiation Oncology | Admitting: Radiation Oncology

## 2017-09-16 DIAGNOSIS — Z510 Encounter for antineoplastic radiation therapy: Secondary | ICD-10-CM | POA: Diagnosis not present

## 2017-09-16 DIAGNOSIS — C760 Malignant neoplasm of head, face and neck: Secondary | ICD-10-CM | POA: Diagnosis not present

## 2017-09-16 DIAGNOSIS — F17210 Nicotine dependence, cigarettes, uncomplicated: Secondary | ICD-10-CM | POA: Diagnosis not present

## 2017-09-17 ENCOUNTER — Ambulatory Visit
Admission: RE | Admit: 2017-09-17 | Discharge: 2017-09-17 | Disposition: A | Discharge status: Home / Self Care | Payer: PPO | Source: Ambulatory Visit | Attending: Radiation Oncology | Admitting: Radiation Oncology

## 2017-09-17 DIAGNOSIS — S82891D Other fracture of right lower leg, subsequent encounter for closed fracture with routine healing: Secondary | ICD-10-CM | POA: Diagnosis not present

## 2017-09-17 DIAGNOSIS — C182 Malignant neoplasm of ascending colon: Secondary | ICD-10-CM | POA: Diagnosis not present

## 2017-09-17 DIAGNOSIS — S82852D Displaced trimalleolar fracture of left lower leg, subsequent encounter for closed fracture with routine healing: Secondary | ICD-10-CM | POA: Diagnosis not present

## 2017-09-17 DIAGNOSIS — Z510 Encounter for antineoplastic radiation therapy: Secondary | ICD-10-CM | POA: Diagnosis not present

## 2017-09-17 DIAGNOSIS — C7970 Secondary malignant neoplasm of unspecified adrenal gland: Secondary | ICD-10-CM | POA: Diagnosis not present

## 2017-09-17 DIAGNOSIS — R262 Difficulty in walking, not elsewhere classified: Secondary | ICD-10-CM | POA: Diagnosis not present

## 2017-09-17 DIAGNOSIS — F17210 Nicotine dependence, cigarettes, uncomplicated: Secondary | ICD-10-CM | POA: Diagnosis not present

## 2017-09-17 DIAGNOSIS — S82892D Other fracture of left lower leg, subsequent encounter for closed fracture with routine healing: Secondary | ICD-10-CM | POA: Diagnosis not present

## 2017-09-17 DIAGNOSIS — E785 Hyperlipidemia, unspecified: Secondary | ICD-10-CM | POA: Diagnosis not present

## 2017-09-17 DIAGNOSIS — C189 Malignant neoplasm of colon, unspecified: Secondary | ICD-10-CM | POA: Diagnosis not present

## 2017-09-17 DIAGNOSIS — Z5112 Encounter for antineoplastic immunotherapy: Secondary | ICD-10-CM | POA: Diagnosis not present

## 2017-09-17 DIAGNOSIS — C760 Malignant neoplasm of head, face and neck: Secondary | ICD-10-CM | POA: Diagnosis not present

## 2017-09-17 DIAGNOSIS — S92154D Nondisplaced avulsion fracture (chip fracture) of right talus, subsequent encounter for fracture with routine healing: Secondary | ICD-10-CM | POA: Diagnosis not present

## 2017-09-17 DIAGNOSIS — R110 Nausea: Secondary | ICD-10-CM | POA: Diagnosis not present

## 2017-09-17 DIAGNOSIS — I10 Essential (primary) hypertension: Secondary | ICD-10-CM | POA: Diagnosis not present

## 2017-09-18 ENCOUNTER — Ambulatory Visit: Payer: PPO

## 2017-09-18 ENCOUNTER — Ambulatory Visit
Admission: RE | Admit: 2017-09-18 | Discharge: 2017-09-18 | Disposition: A | Discharge status: Home / Self Care | Payer: PPO | Source: Ambulatory Visit | Attending: Radiation Oncology | Admitting: Radiation Oncology

## 2017-09-18 DIAGNOSIS — Z510 Encounter for antineoplastic radiation therapy: Secondary | ICD-10-CM | POA: Insufficient documentation

## 2017-09-18 DIAGNOSIS — F17210 Nicotine dependence, cigarettes, uncomplicated: Secondary | ICD-10-CM | POA: Diagnosis not present

## 2017-09-18 DIAGNOSIS — C760 Malignant neoplasm of head, face and neck: Secondary | ICD-10-CM | POA: Insufficient documentation

## 2017-09-19 NOTE — Progress Notes (Signed)
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  INTERVAL HISTORY: Patient returns to clinic today for further evaluation and consideration of cycle 3 of Erbitux.  She is tolerating her infusions well without significant side effects.  Her performance status and mobility continue to improve after requiring major reconstruction of her left ankle.  Her appetite is improved and she is now gaining weight. She has no neurologic complaints. She has no chest pain or shortness of breath. She denies any nausea, vomiting, constipation, or diarrhea.  She has no melena or hematochezia.  She has no urinary complaints. Patient offers no further specific complaints today.  REVIEW OF SYSTEMS:   Review of Systems  Constitutional: Positive for malaise/fatigue. Negative for fever and weight loss.  HENT: Negative.  Negative for sore throat.   Respiratory: Negative.  Negative for cough and shortness of breath.   Cardiovascular: Negative.  Negative for chest pain and leg swelling.  Gastrointestinal: Negative.  Negative for abdominal pain, constipation, nausea and vomiting.  Genitourinary: Negative.   Musculoskeletal: Positive for joint pain. Negative for falls.  Skin: Negative.  Negative for rash.  Neurological: Positive for weakness. Negative for sensory change.  Psychiatric/Behavioral: Negative.  The patient is not nervous/anxious.     As per HPI. Otherwise, a complete review of systems is negative.  PAST MEDICAL HISTORY: Past Medical History:  Diagnosis Date  . Adenocarcinoma of colon ([place])   . Benign neoplasm of ascending colon   . Essential hypertension 06/13/2014    . Goals of care, counseling/discussion 07/14/2017  . HLD (hyperlipidemia) 06/13/2014  . Hypercholesteremia   . Hyperlipidemia   . Multiple thyroid nodules 01/22/2012  . Osteoporosis   . Polycythemia, secondary 12/26/2014  . Pure hypercholesterolemia 03/15/2015  . Thyroid nodule     PAST SURGICAL HISTORY: Past Surgical History:  Procedure Laterality Date  . ABDOMINAL HYSTERECTOMY    . COLONOSCOPY WITH PROPOFOL N/A 07/02/2017   Procedure: COLONOSCOPY WITH PROPOFOL;  Surgeon: Lucilla Lame, MD;  Location: [facility];  Service: Endoscopy;  Laterality: N/A;  . ORIF ANKLE FRACTURE Left 08/08/2017   Procedure: OPEN REDUCTION INTERNAL FIXATION (ORIF) ANKLE FRACTURE;  Surgeon: Dereck Leep, MD;  Location: [facility];  Service: Orthopedics;  Laterality: Left;  . POLYPECTOMY N/A 07/02/2017   Procedure: POLYPECTOMY;  Surgeon: Lucilla Lame, MD;  Location: [facility];  Service: Endoscopy;  Laterality: N/A;  . WRIST FRACTURE SURGERY  08/2009   badly broken    FAMILY HISTORY Family History  Problem Relation Age of Onset  . Heart disease Mother   . Diabetes Father        ADVANCED DIRECTIVES:    HEALTH MAINTENANCE: Social History   Tobacco Use  . Smoking status: Current Every Day Smoker    Packs/day: 0.25    Types: Cigarettes  . Smokeless tobacco: Never Used  . Tobacco comment: occasional smoker  Substance Use Topics  . Alcohol use: No  . Drug use: No     Allergies  Allergen Reactions  . Other Other (See Comments) and Rash    TDAP    Current Outpatient Medications  Medication Sig Dispense Refill  . acetaminophen (TYLENOL) 500 MG tablet Take 500 mg by mouth every  6 (six) hours as needed.    Marland Kitchen amLODipine (NORVASC) 10 MG tablet Take 1 tablet by mouth daily.  3  . dexamethasone (DECADRON) 4 MG tablet Take 1 tablet (4 mg total) by mouth daily. 30 tablet 0  . enoxaparin (LOVENOX) 40 MG/0.4ML injection Inject 0.4 mLs (40 mg total) into the skin daily. 0 Syringe    . ondansetron (ZOFRAN) 8 MG tablet TAKE 1 TABLET (8 MG TOTAL) 2 (TWO) TIMES DAILY AS NEEDED BY MOUTH.  2  . oxyCODONE (OXY IR/ROXICODONE) 5 MG immediate release tablet Take 1-2 tablets (5-10 mg total) by mouth every 6 (six) hours as needed for moderate pain or breakthrough pain ((for MODERATE breakthrough pain)). 15 tablet 0  . pravastatin (PRAVACHOL) 10 MG tablet Take 10 mg by mouth daily.    . prochlorperazine (COMPAZINE) 10 MG tablet TAKE 1 TABLET (10 MG TOTAL) EVERY 6 (SIX) HOURS AS NEEDED BY MOUTH (NAUSEA OR VOMITING).  2  . sucralfate (CARAFATE) 1 g tablet Take 1 tablet (1 g total) by mouth 3 (three) times daily. Dissolve in 2-3 tbsp warm water, swish and swallow. (Patient taking differently: Take 1 g by mouth 2 (two) times daily. Dissolve in 2-3 tbsp warm water, swish and swallow.) 90 tablet 3   No current facility-administered medications for this visit.    Facility-Administered Medications Ordered in Other Visits  Medication Dose Route Frequency Provider Last Rate Last Dose  . heparin lock flush 100 unit/mL  250 Units Intracatheter PRN Leia Alf, MD      . heparin lock flush 100 unit/mL  500 Units Intracatheter PRN Leia Alf, MD      . sodium chloride 0.9 % injection 10 mL  10 mL Intracatheter PRN Leia Alf, MD      . sodium chloride 0.9 % injection 10 mL  10 mL Intracatheter PRN Leia Alf, MD      . sodium chloride 0.9 % injection 10 mL  10 mL Intracatheter PRN Ma Hillock, Sandeep, MD      . sodium chloride 0.9 % injection 10 mL  10 mL Intracatheter PRN Leia Alf, MD      -year-old  OBJECTIVE: Vitals:   09/22/17 0925  BP: 108/69  Pulse: 66  Temp: 97.6 F (36.4 C)     Body mass index is 19.43 kg/m.    ECOG FS:0 - Asymptomatic  General: Well-developed, well-nourished, no acute distress. Eyes: Pink conjunctiva, anicteric sclera. HEENT: Lymphadenopathy in left neck no longer palpable. Lungs: Clear to auscultation bilaterally. Heart: Regular rate and  rhythm. No rubs, murmurs, or gallops. Abdomen: Soft, nontender, nondistended. No organomegaly noted, normoactive bowel sounds. Musculoskeletal: No edema, cyanosis, or clubbing. Neuro: Alert, answering all questions appropriately. Cranial nerves grossly intact. Skin: No rashes or petechiae noted. Psych: Normal affect.   LAB RESULTS:  Lab Results  Component Value Date   NA 132 (L) 09/22/2017   K 3.7 09/22/2017   CL 97 (L) 09/22/2017   CO2 25 09/22/2017   GLUCOSE 89 09/22/2017   BUN 20 09/22/2017   CREATININE 0.66 09/22/2017   CALCIUM 8.9 09/22/2017   PROT 5.9 (L) 08/07/2017   ALBUMIN 3.5 08/07/2017   AST 22 08/07/2017   ALT 19 08/07/2017   ALKPHOS 70 08/07/2017   BILITOT 0.4 08/07/2017   GFRNONAA >60 09/22/2017   GFRAA >60 09/22/2017    Lab Results  Component Value Date   WBC 11.8 (H) 09/22/2017   NEUTROABS 10.3 (H) 09/22/2017   HGB 13.7 09/22/2017   HCT 41.3 09/22/2017  MCV 94.4 09/22/2017   PLT 269 09/22/2017     STUDIES: No results found.  ASSESSMENT: Stage IVa head and neck squamous cell carcinoma, adenocarcinoma of the colon  PLAN:    1. 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  Patient was also discussed with ENT as well as at cancer conference.  Because of wound healing from her ankle surgery, cisplatin was discontinued.  Proceed with cycle 3 of Erbitux today. Continue daily XRT completing on September 25, 2017.  Patient now has completed all of her therapy.  Return to clinic in 6 weeks with repeat PET scan and further evaluation. 2.  Adenocarcinoma of the colon: Appreciate surgical input.  Patient has requested to delay surgery or treatment until after her head and neck treatments are complete.  CEA is within normal limits.  Patient states she has an appointment with surgery on October 28, 2017 for reevaluation.  PET scan as above, if head neck cancer has resolved patient will likely proceed with  partial colectomy. 3.  Fractured ankles: Continue follow-up and treatment per orthopedics. 4.  Decreased performance status: Improving.  Discontinue cisplatin as above.  Approximately 30 minutes was spent in discussion of which greater than 50% was consultation.   [name], MD 09/25/17 3:29 PM

## 2017-09-21 ENCOUNTER — Ambulatory Visit
Admission: RE | Admit: 2017-09-21 | Discharge: 2017-09-21 | Disposition: A | Discharge status: Home / Self Care | Payer: PPO | Source: Ambulatory Visit | Attending: Radiation Oncology | Admitting: Radiation Oncology

## 2017-09-21 DIAGNOSIS — F17210 Nicotine dependence, cigarettes, uncomplicated: Secondary | ICD-10-CM | POA: Diagnosis not present

## 2017-09-21 DIAGNOSIS — C760 Malignant neoplasm of head, face and neck: Secondary | ICD-10-CM | POA: Diagnosis not present

## 2017-09-21 DIAGNOSIS — Z510 Encounter for antineoplastic radiation therapy: Secondary | ICD-10-CM | POA: Diagnosis not present

## 2017-09-22 ENCOUNTER — Inpatient Hospital Stay: Payer: PPO

## 2017-09-22 ENCOUNTER — Inpatient Hospital Stay (HOSPITAL_BASED_OUTPATIENT_CLINIC_OR_DEPARTMENT_OTHER): Payer: PPO | Admitting: Oncology

## 2017-09-22 ENCOUNTER — Other Ambulatory Visit: Payer: Self-pay | Admitting: *Deleted

## 2017-09-22 ENCOUNTER — Ambulatory Visit: Payer: PPO

## 2017-09-22 ENCOUNTER — Inpatient Hospital Stay: Payer: PPO | Attending: Oncology

## 2017-09-22 ENCOUNTER — Ambulatory Visit: Payer: Self-pay | Admitting: *Deleted

## 2017-09-22 ENCOUNTER — Other Ambulatory Visit: Payer: Self-pay

## 2017-09-22 ENCOUNTER — Encounter: Payer: Self-pay | Admitting: Oncology

## 2017-09-22 VITALS — BP 116/68 | HR 68 | Resp 20

## 2017-09-22 VITALS — BP 108/69 | HR 66 | Temp 97.6°F | Wt 113.2 lb

## 2017-09-22 DIAGNOSIS — C760 Malignant neoplasm of head, face and neck: Secondary | ICD-10-CM

## 2017-09-22 DIAGNOSIS — Z5112 Encounter for antineoplastic immunotherapy: Secondary | ICD-10-CM | POA: Diagnosis not present

## 2017-09-22 DIAGNOSIS — C4442 Squamous cell carcinoma of skin of scalp and neck: Secondary | ICD-10-CM

## 2017-09-22 DIAGNOSIS — C189 Malignant neoplasm of colon, unspecified: Secondary | ICD-10-CM | POA: Diagnosis not present

## 2017-09-22 LAB — CBC WITH DIFFERENTIAL/PLATELET
BASOS ABS: 0.1 10*3/uL (ref 0–0.1)
BASOS PCT: 1 %
Eosinophils Absolute: 0 10*3/uL (ref 0–0.7)
Eosinophils Relative: 0 %
HEMATOCRIT: 41.3 % (ref 35.0–47.0)
HEMOGLOBIN: 13.7 g/dL (ref 12.0–16.0)
LYMPHS PCT: 7 %
Lymphs Abs: 0.8 10*3/uL — ABNORMAL LOW (ref 1.0–3.6)
MCH: 31.4 pg (ref 26.0–34.0)
MCHC: 33.3 g/dL (ref 32.0–36.0)
MCV: 94.4 fL (ref 80.0–100.0)
Monocytes Absolute: 0.6 10*3/uL (ref 0.2–0.9)
Monocytes Relative: 5 %
NEUTROS ABS: 10.3 10*3/uL — AB (ref 1.4–6.5)
NEUTROS PCT: 87 %
Platelets: 269 10*3/uL (ref 150–440)
RBC: 4.37 MIL/uL (ref 3.80–5.20)
RDW: 17.6 % — ABNORMAL HIGH (ref 11.5–14.5)
WBC: 11.8 10*3/uL — AB (ref 3.6–11.0)

## 2017-09-22 LAB — BASIC METABOLIC PANEL
ANION GAP: 10 (ref 5–15)
BUN: 20 mg/dL (ref 6–20)
CALCIUM: 8.9 mg/dL (ref 8.9–10.3)
CO2: 25 mmol/L (ref 22–32)
Chloride: 97 mmol/L — ABNORMAL LOW (ref 101–111)
Creatinine, Ser: 0.66 mg/dL (ref 0.44–1.00)
GLUCOSE: 89 mg/dL (ref 65–99)
POTASSIUM: 3.7 mmol/L (ref 3.5–5.1)
Sodium: 132 mmol/L — ABNORMAL LOW (ref 135–145)

## 2017-09-22 LAB — MAGNESIUM: MAGNESIUM: 1.9 mg/dL (ref 1.7–2.4)

## 2017-09-22 MED ORDER — EMPTY CONTAINERS FLEXIBLE MISC: 250.0000 mg/m2 | Freq: Once | Status: DC

## 2017-09-22 MED ORDER — CETUXIMAB CHEMO IV INJECTION 200 MG/100ML
250.0000 mg/m2 | Freq: Once | INTRAVENOUS | Status: AC
  Administered 2017-09-22: 400 mg via INTRAVENOUS
  Filled 2017-09-22: qty 200

## 2017-09-22 MED ORDER — SODIUM CHLORIDE 0.9 % IV SOLN
Freq: Once | INTRAVENOUS | Status: AC
  Administered 2017-09-22: 11:00:00 via INTRAVENOUS
  Filled 2017-09-22: qty 1000

## 2017-09-22 MED ORDER — DIPHENHYDRAMINE HCL 50 MG/ML IJ SOLN
25.0000 mg | Freq: Once | INTRAMUSCULAR | Status: AC
  Administered 2017-09-22: 25 mg via INTRAVENOUS
  Filled 2017-09-22: qty 1

## 2017-09-22 NOTE — Patient Outreach (Signed)
[facility]) Care Management  09/22/2017  Kelleigh Skerritt [facility] 1941/03/01 [id number]    Phone call to Queen Blossom, discharge planner at [facility] to discuss discharge plans for patient. Per Shawn, patient is doing well, walking with a walker and going up steps. She will be returning home with her husband with HH. Patient continues to receive chemotherapy and Radiation.   Plan: Home visit scheduled for 09/24/17.   Sheralyn Boatman [facility] Care Management [phone]

## 2017-09-23 ENCOUNTER — Ambulatory Visit: Payer: Self-pay | Admitting: *Deleted

## 2017-09-23 ENCOUNTER — Ambulatory Visit
Admission: RE | Admit: 2017-09-23 | Discharge: 2017-09-23 | Disposition: A | Discharge status: Home / Self Care | Payer: PPO | Source: Ambulatory Visit | Attending: Radiation Oncology | Admitting: Radiation Oncology

## 2017-09-23 DIAGNOSIS — C760 Malignant neoplasm of head, face and neck: Secondary | ICD-10-CM | POA: Diagnosis not present

## 2017-09-23 DIAGNOSIS — Z510 Encounter for antineoplastic radiation therapy: Secondary | ICD-10-CM | POA: Diagnosis not present

## 2017-09-23 DIAGNOSIS — F17210 Nicotine dependence, cigarettes, uncomplicated: Secondary | ICD-10-CM | POA: Diagnosis not present

## 2017-09-24 ENCOUNTER — Ambulatory Visit: Payer: Self-pay | Admitting: *Deleted

## 2017-09-24 ENCOUNTER — Ambulatory Visit: Payer: PPO

## 2017-09-24 DIAGNOSIS — C182 Malignant neoplasm of ascending colon: Secondary | ICD-10-CM | POA: Diagnosis not present

## 2017-09-24 DIAGNOSIS — C760 Malignant neoplasm of head, face and neck: Secondary | ICD-10-CM | POA: Diagnosis not present

## 2017-09-24 DIAGNOSIS — S92154D Nondisplaced avulsion fracture (chip fracture) of right talus, subsequent encounter for fracture with routine healing: Secondary | ICD-10-CM | POA: Diagnosis not present

## 2017-09-24 DIAGNOSIS — R110 Nausea: Secondary | ICD-10-CM | POA: Diagnosis not present

## 2017-09-24 DIAGNOSIS — Z510 Encounter for antineoplastic radiation therapy: Secondary | ICD-10-CM | POA: Diagnosis not present

## 2017-09-24 DIAGNOSIS — E785 Hyperlipidemia, unspecified: Secondary | ICD-10-CM | POA: Diagnosis not present

## 2017-09-24 DIAGNOSIS — F17210 Nicotine dependence, cigarettes, uncomplicated: Secondary | ICD-10-CM | POA: Diagnosis not present

## 2017-09-24 DIAGNOSIS — S82852D Displaced trimalleolar fracture of left lower leg, subsequent encounter for closed fracture with routine healing: Secondary | ICD-10-CM | POA: Diagnosis not present

## 2017-09-25 ENCOUNTER — Ambulatory Visit: Payer: PPO

## 2017-09-25 ENCOUNTER — Other Ambulatory Visit: Payer: Self-pay | Admitting: *Deleted

## 2017-09-25 DIAGNOSIS — Z510 Encounter for antineoplastic radiation therapy: Secondary | ICD-10-CM | POA: Diagnosis not present

## 2017-09-25 DIAGNOSIS — F17210 Nicotine dependence, cigarettes, uncomplicated: Secondary | ICD-10-CM | POA: Diagnosis not present

## 2017-09-25 DIAGNOSIS — C760 Malignant neoplasm of head, face and neck: Secondary | ICD-10-CM | POA: Diagnosis not present

## 2017-09-25 NOTE — Patient Outreach (Addendum)
  [name]) Care Management  [facility] Social Work  09/25/2017  Dana Perez [facility] 24-Oct-1940 [id number]  Subjective:  Patient is a 77 year old female, currently in rehab at [facility] following hospitalization with bilateral ankle fractures. Patient has a known history of stage IV head and neck cancer. Per patient her chemotherapy and radiation treatments have ended this week. Radiation ended today.  Patient will be returning home with her husband and HH. Patient describes a positive support network. Per patient, she is ready to return home.  Objective:   Encounter Medications:  Outpatient Encounter Medications as of 09/25/2017  Medication Sig  . acetaminophen (TYLENOL) 500 MG tablet Take 500 mg by mouth every 6 (six) hours as needed.  Marland Kitchen amLODipine (NORVASC) 10 MG tablet Take 1 tablet by mouth daily.  Marland Kitchen dexamethasone (DECADRON) 4 MG tablet Take 1 tablet (4 mg total) by mouth daily.  Marland Kitchen enoxaparin (LOVENOX) 40 MG/0.4ML injection Inject 0.4 mLs (40 mg total) into the skin daily.  . ondansetron (ZOFRAN) 8 MG tablet TAKE 1 TABLET (8 MG TOTAL) 2 (TWO) TIMES DAILY AS NEEDED BY MOUTH.  Marland Kitchen oxyCODONE (OXY IR/ROXICODONE) 5 MG immediate release tablet Take 1-2 tablets (5-10 mg total) by mouth every 6 (six) hours as needed for moderate pain or breakthrough pain ((for MODERATE breakthrough pain)).  . pravastatin (PRAVACHOL) 10 MG tablet Take 10 mg by mouth daily.  . prochlorperazine (COMPAZINE) 10 MG tablet TAKE 1 TABLET (10 MG TOTAL) EVERY 6 (SIX) HOURS AS NEEDED BY MOUTH (NAUSEA OR VOMITING).  Marland Kitchen sucralfate (CARAFATE) 1 g tablet Take 1 tablet (1 g total) by mouth 3 (three) times daily. Dissolve in 2-3 tbsp warm water, swish and swallow. (Patient taking differently: Take 1 g by mouth 2 (two) times daily. Dissolve in 2-3 tbsp warm water, swish and swallow.)   Facility-Administered Encounter Medications as of 09/25/2017  Medication  . heparin lock flush 100 unit/mL  . heparin lock flush 100  unit/mL  . sodium chloride 0.9 % injection 10 mL  . sodium chloride 0.9 % injection 10 mL  . sodium chloride 0.9 % injection 10 mL  . sodium chloride 0.9 % injection 10 mL    Functional Status:  In your present state of health, do you have any difficulty performing the following activities: 08/07/2017 08/07/2017  Hearing? - N  Vision? - N  Difficulty concentrating or making decisions? - N  Walking or climbing stairs? - N  Dressing or bathing? - N  Doing errands, shopping? N -  Some recent data might be hidden    Fall/Depression Screening:  PHQ 2/9 Scores 07/06/2017  PHQ - 2 Score 0    Assessment: Patient very pleasant and quiet.  Patient's spouse, very attentive and engaged in her care.  Patient's spouse spoke up more for patient. [facility] care management services discussed, consent signed. This Education officer, museum prepared patient and her spouse for anticipated phone calls from the [facility] agency as well as [facility] care management. Per patient's spouse, patient has a new wheelchair, walker and cane. Patient also has a adjustable wheelchair., Patient to discharge home on 09/26/16. Per patient's spouse, patient actively particpating in therapy and is learning how to walk up stirs.  Plan: Referral to [facility] for transition of care.            This Education officer, museum to assess for additional community resource needs upon patient's return home.  Sheralyn Boatman [facility] Care Management [phone]

## 2017-09-27 DIAGNOSIS — S82852D Displaced trimalleolar fracture of left lower leg, subsequent encounter for closed fracture with routine healing: Secondary | ICD-10-CM | POA: Insufficient documentation

## 2017-09-28 ENCOUNTER — Encounter: Payer: Self-pay | Admitting: *Deleted

## 2017-09-28 ENCOUNTER — Other Ambulatory Visit: Payer: Self-pay | Admitting: *Deleted

## 2017-09-28 DIAGNOSIS — M810 Age-related osteoporosis without current pathological fracture: Secondary | ICD-10-CM | POA: Diagnosis not present

## 2017-09-28 DIAGNOSIS — J440 Chronic obstructive pulmonary disease with acute lower respiratory infection: Secondary | ICD-10-CM | POA: Diagnosis not present

## 2017-09-28 DIAGNOSIS — Z87440 Personal history of urinary (tract) infections: Secondary | ICD-10-CM | POA: Diagnosis not present

## 2017-09-28 DIAGNOSIS — J189 Pneumonia, unspecified organism: Secondary | ICD-10-CM | POA: Diagnosis not present

## 2017-09-28 DIAGNOSIS — S82891D Other fracture of right lower leg, subsequent encounter for closed fracture with routine healing: Secondary | ICD-10-CM | POA: Diagnosis not present

## 2017-09-28 DIAGNOSIS — Z9181 History of falling: Secondary | ICD-10-CM | POA: Diagnosis not present

## 2017-09-28 DIAGNOSIS — I10 Essential (primary) hypertension: Secondary | ICD-10-CM | POA: Diagnosis not present

## 2017-09-28 DIAGNOSIS — C189 Malignant neoplasm of colon, unspecified: Secondary | ICD-10-CM | POA: Diagnosis not present

## 2017-09-28 DIAGNOSIS — Z87891 Personal history of nicotine dependence: Secondary | ICD-10-CM | POA: Diagnosis not present

## 2017-09-28 DIAGNOSIS — E785 Hyperlipidemia, unspecified: Secondary | ICD-10-CM | POA: Diagnosis not present

## 2017-09-28 DIAGNOSIS — C4442 Squamous cell carcinoma of skin of scalp and neck: Secondary | ICD-10-CM | POA: Diagnosis not present

## 2017-09-28 DIAGNOSIS — E119 Type 2 diabetes mellitus without complications: Secondary | ICD-10-CM | POA: Diagnosis not present

## 2017-09-28 DIAGNOSIS — K5900 Constipation, unspecified: Secondary | ICD-10-CM | POA: Diagnosis not present

## 2017-09-28 DIAGNOSIS — J449 Chronic obstructive pulmonary disease, unspecified: Secondary | ICD-10-CM | POA: Diagnosis not present

## 2017-09-28 DIAGNOSIS — M1990 Unspecified osteoarthritis, unspecified site: Secondary | ICD-10-CM | POA: Diagnosis not present

## 2017-09-28 DIAGNOSIS — M8580 Other specified disorders of bone density and structure, unspecified site: Secondary | ICD-10-CM | POA: Diagnosis not present

## 2017-09-28 DIAGNOSIS — S82832D Other fracture of upper and lower end of left fibula, subsequent encounter for closed fracture with routine healing: Secondary | ICD-10-CM | POA: Diagnosis not present

## 2017-09-28 NOTE — Patient Outreach (Signed)
Dana Perez [facility]) Care Management [place] Telephone Outreach, Transition of Care day 1  09/28/2017  Dana Perez [facility] Feb 01, 1941 [id number]  Successful telephone outreach to Dana Perez, 77 y/o female referred originally to [place] by insurance plan; screening call by [facility] telephonic RN CM revealed recent hospitalization December 21-24, 2018 for bilateral ankle fractures; patient was discharged to SNF for rehabilitation, and discharged to home from SNF / self-care with home health [facility]) services in place, on September 26, 2017.  Patient referred to [place] by [facility] CSW for transition of care following SNF discharge on 09/26/17.  Patient has history including, but not limited to, HTN/ HLD; colon cancer and head/ neck cancer.  HIPAA/ identity verified with patient during phone call; patient provided permission today to speak with her husband, Dana Perez; patient participates in phone call intermittently while phone is on speaker mode.  [place] services were discussed with patient and her spouse, including difference between [facility] CM and [place] services; verbal permission for [place] involvement in patient's care was obtained.  Today, patient states she "is doing fine," and she denies pain, concerns, or problems, and states that she is "happy" to be home from the SNF.  Patient sounds to be in no obvious/ apparent distress throughout today's 45 minute phone call; patient's spouse completed most of phone call, and he denies that patient is in any distress.  Patient/ caregiver further report:  Medications: -- Has all medicationsand takes as prescribed;denies questions/ concerns around current medications.  -- Spouse verbalizes good general understanding of the purpose, dosing, and scheduling of medications.   -- husband prepares/ manages medications for patient using weekly pill planner box. -- husband denies issues with swallowing medications; states that for  large pills, he crushes pills or dissolves them -- patient was recently discharged from the hospital and all medications were thoroughly reviewed with patient's spouse today  Home health [facility]) services: -- [place] services for PT, OT, RN in place through [facility] care agency -- spouse confirms that he has heard from [place] and PT; confirms that he has phone number for [facility] and is expecting home visit by nurse, "any time now." -- Encouraged spouse to encourage patient to actively participate in all home services as ordered  Provider appointments: -- All upcoming provider appointments were reviewed with patient's spouse today; patient's spouse reports that he has not yet scheduled SNF discharge follow up appointment with patient's PCP; encouraged patient's spouse to schedule this appointment promptly, and discussed value of prompt PCP appointment follow up with SNF/ hospital discharge.  Spouse verbalizes understanding and agreement and states he will schedule this appointment -- Patient's spouse accurately verbalizes all other scheduled appointments and states he will transport patient to all provider appointments   Safety/ Mobility/ Falls: -- denies new/ recent falls; states that patient has had only one fall ever, which resulted in her recent hospitalization/ SNF visit -- assistive devices: spouse reports patient uses walker "all the time" since recent bilateral ankle fractures -- general fall risks/ prevention education discussed with patient/ spouse today, and I encouraged patient's spouse to share with patient importance of using assistive devices for fall prevention, and to actively participate in [facility] PT sessions, which spouse agrees to do  [employer] needs: -- currently denies community resource needs, stating supportive spouse that assist with care needs as indicated -- spouse provides transportation for patient to all provider appointments, errands, etc  Advanced  Directive (AD) Planning:   --  reports does not currently have exisisting AD in place; discussed basics of AD's with patient's spouse, who is agreeable/ interested in creating AD's for both patient and himself; discussed that I could assist in this process, and patient's spouse is agreeable.  Encouraged patient's spouse to begin discussing patient's wishes around AD's with patient, for further discussion during subsequent [facility] CCM RN outreach    Self-health management of head/ neck/ colon cancer and recent fall with bilateral LE fractures: -- patient's spouse reports accurate understanding of overall plan of care around patient's head/ neck/ colon cancer; states that patient has completed her chemo and radiation treatments, and that patient has a PET scan scheduled for evaluation of cancer and development of ongoing plan of care -- reports that patient's appetite is "very good," verbalizes good general understanding of importance of well-balanced diet -- denies overt fall hazards in their home; again encouraged spouse to have patient use assistive devices/ participate in home health PT sessions, and to move slowly and deliberately with ambulation  Patient's spouse denies further issues, concerns, or problems today, and stated that home health nurse has arrived at their home.  I provided/ confirmed that patient/ spouse have my direct phone number, the main [facility] CM office phone number, and the [facility] CM 24-hour nurse advice phone number should issues arise prior to next scheduled [place] outreach, with telephone call for ongoing transition of care next week.  Discussed with patient's spouse that we would schedule [facility] CM initial home visit at the time of call next week.  Plan:  Patient will take medications as prescribed and will attend all scheduled provider appointments  Patient will actively participate in home health services as ordered post-hospital discharge  Patient will use assistive  devices for ambulation  Patient/ spouse will promptly notify patient's care providers for any new concerns, issues, or problems that arise  Patient/ spouse will promptly schedule post- SNF discharge PCP office visit  I will make patient's PCP aware of [place] CM involvement in patient's care post-SNF discharge  [place] outreach for ongoing transition of care to continue with scheduled telephone call next week      [facility] CM Care Plan Problem One     Most Recent Value  Care Plan Problem One  Risk for hospital readmission, as evidenced by recent hospital and SNF admissions  Role Documenting the Problem One  Care Management Coordinator  Care Plan for Problem One  Active  THN Long Term Goal   Over the next 31 days, patient will not experience hosital readmission, as evidenced by patient/ caregiver reporting and review of EMR during [facility] RN CCM outreach  [facility] Long Term Goal Start Date  09/28/17  Interventions for Problem One Long Term Goal  TOC call placed,  THN CM TOC program initiated.  Discussed with patient/ caregiver difference between [facility] services and [place] services,  discussed patient's current clinical status with patient and caregiver,  medication reconciliation completed during phone call today  THN CM Short Term Goal #1   Over the next 30 days, patient will actively participate in home health services for nursing and PT, as evidenced by patient/ caregiver reporting and collaboration with [facility] staff as indicated, during [place]  outreach  [facility] CM Short Term Goal #1 Start Date  09/28/17  Interventions for Short Term Goal #1  Discussed with patient and caregiver difference between [place] services/ program and Home health services,  ensured that patient's caregiver has  [place] phone number, has heard from [facility] staff and that [facility] RN has made first home visit  THN CM Short Term Goal #2   Over the next 7 days, patient/ caregiver will contact PCP to schedule SNF follow up office  visit, as evidenced by patient/ caregiver reporting and review of EMR during [facility] RN CCM outreach  [facility] CM Short Term Goal #2 Start Date  09/28/17  Interventions for Short Term Goal #2  Discussed with patient/ caregiver value/ need for prompt PCP follow up post-SNF discharge,  encouraged patient's caregiver to schedule follow up appointment for patient promptly    [facility] CM Care Plan Problem Two     Most Recent Value  Care Plan Problem Two  High Risk for falls, as evidenced by recent fall requiring hospitalization and SNF placement  Role Documenting the Problem Two  Care Management Coordinator  Care Plan for Problem Two  Active  Interventions for Problem Two Long Term Goal   Discussed with patient and caregiver importance of consistent use of assistive devices,  encouraged patient to consistently use walker and to actively participate with home health services for PT  [facility] Long Term Goal  Over the next 60 days, patient will use assistive devices as prescribed post- SNF discharge, as evidenced by patient/ caregiver reporting during [facility] RN CM outreach  [facility] Long Term Goal Start Date  09/28/17     I appreciate the opportunity to participate in Will's care,  Oneta Rack, RN, BSN, [employer] Coordinator [facility] Care Management  [phone]

## 2017-09-30 ENCOUNTER — Other Ambulatory Visit: Payer: Self-pay | Admitting: *Deleted

## 2017-09-30 ENCOUNTER — Encounter: Payer: Self-pay | Admitting: *Deleted

## 2017-09-30 NOTE — Patient Outreach (Signed)
[place] [facility]) Care Management  09/30/2017  Dana Perez [facility] 1940-12-18 [id number]   Follow up call to patient post discharge from [facility] to assess for community resource needs. Per patient, she is doing well since she has been home. Patient describes good family support. Her sister was with her today to give her spouse a break.  Per patient, her husband assists with meals, transportation and medication management. Patient denies having any community resource needs at this time. Patient to be closed to [facility] social work.  THN RNCM to be notified.   Sheralyn Boatman [facility] Care Management [phone]

## 2017-10-01 ENCOUNTER — Ambulatory Visit: Payer: Self-pay | Admitting: *Deleted

## 2017-10-01 ENCOUNTER — Encounter: Payer: Self-pay | Admitting: *Deleted

## 2017-10-02 DIAGNOSIS — E7800 Pure hypercholesterolemia, unspecified: Secondary | ICD-10-CM | POA: Diagnosis not present

## 2017-10-02 DIAGNOSIS — I10 Essential (primary) hypertension: Secondary | ICD-10-CM | POA: Diagnosis not present

## 2017-10-02 DIAGNOSIS — N183 Chronic kidney disease, stage 3 (moderate): Secondary | ICD-10-CM | POA: Diagnosis not present

## 2017-10-05 ENCOUNTER — Other Ambulatory Visit: Payer: Self-pay | Admitting: *Deleted

## 2017-10-05 ENCOUNTER — Encounter: Payer: Self-pay | Admitting: *Deleted

## 2017-10-05 NOTE — Patient Outreach (Signed)
[place] [facility]) Care Management [place] Telephone Outreach, Transition of Care day 8  10/05/2017  Ranee Peasley [facility] 1940/10/30 [id number]  Successful telephone outreach to [facility], spouse/ caregiver of Bryant Lipps, 77 y/o female referred originally to [place] by insurance plan; screening call by [facility] telephonic RN CM revealed recent hospitalization December 21-24, 2018 for bilateral ankle fractures; patient was discharged to SNF for rehabilitation, and discharged to home from SNF / self-care with home health [facility]) services in place, on September 26, 2017.  Patient referred to [place] by [facility] CSW for transition of care following SNF discharge on 09/26/17.  Patient has history including, but not limited to, HTN/ HLD; colon cancer and head/ neck cancer.  HIPAA/ identity verified with patient's spouse during phone call today; patient previously provided permission to speak with her husband, Branda Chaudhary.  Today, patient's spouse reports that patient "had a good weekend, and is doing well."  Spouse denies that patient is in pain/ distress, or that he nor patient have concerns around patient's condition.  Patient's spouse/ caregiver further reports:  Medications: -- Has all medicationsand continues taking as prescribed;denies questions/ concerns around current medications.  -- Spouse reports that during recent PCP office visit patient was prescribed "new throat lozenges" for "mouth sores," which patient has started taking; reports "helping."  Spouse reports can not remember name of new throat lozenges. -- States that patient had not other changes to medications as a result of PCP office visit 10/02/17.  Home health [facility]) services: -- [place] services for PT, OT, RN in place through [facility] care agency; reports visits "going just fine." -- spouse confirms that patient is actively participate in all home services as ordered; reports [facility] OT is currently present at  their home, working with patient  Provider appointments: -- All upcoming provider appointments were reviewed with patient's spouse today; states he will transport patient to all provider appointments -- reports patient scheduled/ attended PCP office visit 10/02/17; reviewed PCP office visit with patient's spouse  Safety/ Mobility/ Falls: -- denies new/ recent falls since SNF discharge/ last [facility] CCM outreach -- assistive devices: spouse reports patient continues uses walker "all the time" since recent bilateral ankle fractures -- general fall risks/ prevention education reiterated with patient's spouse today  Patient's spouse denies further issues, concerns, or problems today. I provided/ confirmed that patient/ spouse havemy direct phone number, the main [facility] CM office phone number, and the [facility] CM 24-hour nurse advice phone number should issues arise prior to next scheduled [place] outreach, with telephone call for ongoing transition of care next week.  THN Community RN CM initial home visit in 2 weeks scheduled today with patient's spouse.  Plan:  Patient will take medications as prescribed and will attend all scheduled provider appointments  Patient will actively participate in home health services as ordered post-hospital discharge  Patient will use assistive devices for ambulation  Patient/ spouse will promptly notify patient's care providers for any new concerns, issues, or problems that arise  [facility] CM outreach for ongoing transition of care to continue with scheduled telephone call next week, and initial home visit in 2 weeks   [facility] CM Care Plan Problem One     Most Recent Value  Care Plan Problem One  Risk for hospital readmission, as evidenced by recent hospital and SNF admissions  Role Documenting the Problem One  Care Management [place] for Problem One  Active  [facility] Long Term  Goal   Over the next 31 days, patient will not experience  hosital readmission, as evidenced by patient/ caregiver reporting and review of EMR during [place] outreach  [place] Term Goal Start Date  09/28/17  Interventions for Problem One Long Term Goal  Discussed with patient's caregiver patient's current clinical status,  recent PCP office visit on 10/02/17,  reviewed upcoming provider appointments with caregiver and scheduled THN RN CCM initial home visit  THN CM Short Term Goal #1   Over the next 30 days, patient will actively participate in home health services for nursing and PT, as evidenced by patient/ caregiver reporting and collaboration with [facility] staff as indicated, during [place]  outreach  [facility] CM Short Term Goal #1 Start Date  09/28/17  Interventions for Short Term Goal #1  Discussed with patient's caregiver current home health services in place,  patient's participation in [facility] services,  reiterated difference between [place] services/ program and Home health services,  discussed recent [facility] visits with patient  THN CM Short Term Goal #2   Over the next 7 days, patient/ caregiver will contact PCP to schedule SNF follow up office visit, as evidenced by patient/ caregiver reporting and review of EMR during [facility] RN CCM outreach  [facility] CM Short Term Goal #2 Start Date  09/28/17  [facility] CM Short Term Goal #2 Met Date  10/05/17-- Goal met    [facility] CM Care Plan Problem Two     Most Recent Value  Care Plan Problem Two  High Risk for falls, as evidenced by recent fall requiring hospitalization and SNF placement  Role Documenting the Problem Two  Care Management Coordinator  Care Plan for Problem Two  Active  Interventions for Problem Two Long Term Goal   Discussed with patient's caregiver patient's use of assistive devices,  again encouraged patient to consistently use walker and to actively participate with home health services for PT,  confirmed that patient has experienced no new falls since last [facility] RN CCM outreach  [facility] Long Term Goal  Over the next  60 days, patient will use assistive devices as prescribed post- SNF discharge, as evidenced by patient/ caregiver reporting during [facility] RN CM outreach  [facility] Long Term Goal Start Date  09/28/17     Oneta Rack, RN, BSN, [employer] Coordinator [facility] Care Management  [phone]

## 2017-10-12 ENCOUNTER — Other Ambulatory Visit: Payer: Self-pay | Admitting: *Deleted

## 2017-10-12 ENCOUNTER — Encounter: Payer: Self-pay | Admitting: *Deleted

## 2017-10-12 NOTE — Patient Outreach (Signed)
[facility]) Care Management [place] Telephone Outreach, Transition of Care day 15  10/12/2017  Dana Perez [facility] 20-Apr-1941 [id number]  Successful telephone outreach toEkard Perez, spouse/ caregiver, on [facility] CM written consent, of Dana Perez, 77 y/o female referred originally to [place] by insurance plan; screening call by [facility] telephonic RN CM revealed recent hospitalization December 21-24, 2018 for bilateral ankle fractures; patient was discharged to SNF for rehabilitation, and discharged to home from SNF / self-care with home health [facility]) services in place, on September 26, 2017. Patient referred to [place] by [facility] CSW for transition of care following SNF discharge on 09/26/17.Patient has history including, but not limited to, HTN/ HLD; colon cancer and head/ neck cancer.HIPAA/ identity verified with patient's spouse during phone call today.  Today,patient's spouse reports that patient "is doing pretty good," but states that she "has developed more swelling around her (L) ankle."  States that the [facility] nurse came by twice last week, and has been monitoring the swelling.  Spouse reports that he will be taking patient to orthopedic provider office visit tomorrow; states that he is hoping the orthopedist can provide a reason for the new swelling; reports that he has been putting ice on the ankle and has been keeping her foot elevated "as much as possible;" positive reinforcement provided.   Spouse denies that patient is in pain/ distress, and states that she "is not in pain-- her (L) ankle just tingles, because of the swelling."  States that patient has been able to participate in [facility] PT "without any problems" around her new development of ankle swelling.  Patient's spouse/ caregiver further reports:  Medications: -- Has all medicationsand continues taking as prescribed;denies questions/ concerns aroundcurrent medications.  --Spouse previously reported  that patient was prescribed "new throat lozenges" for "mouth sores," which he states today, "worked;" adding that "all those sores in her mouth have now gone; she feels much better."  Home health [facility]) services: -- [place] services for PT, OT, [facility]; reports visits "going just fine." --spouseconfirms thatpatient also has CNA bath aide involved, and that she will be visiting with patient "later today."  Provider appointments: -- All upcoming provider appointments were reviewed with patient's spouse today; states he will transport patient to all provider appointments  Safety/ Mobility/ Falls: -- denies new/ recent falls since SNF discharge/ last [place] outreach -- assistive devices:spouse reports patient continues uses walker "all the time" since recent bilateral ankle fractures -- general fall risks/ prevention education reiterated with patient's spousetoday  Patient's spousedenies further issues, concerns, or problems today. Iprovided/confirmed that patient/ spousehavemy direct phone number, the main THN CM office phone number, and the [facility] CM 24-hour nurse advice phone number should issues arise prior to next scheduled [place] outreach, with initial home visit next week.    Plan:  Patient will take medications as prescribed and will attend all scheduled provider appointments  Patient will actively participate in home health services as ordered post-hospital discharge  Patient will continue using assistive devices for ambulation  Patient/ spouse will promptly notify patient's care providers for any new concerns, issues, or problems that arise  [facility] CM outreach for ongoing transition of care to continue with scheduled initial home visit next week  [facility] CM Care Plan Problem One     Most Recent Value  Care Plan Problem One  Risk for hospital readmission, as evidenced by recent hospital and SNF admissions  Role  Documenting the Problem One  Care Management Coordinator  Care Plan for Problem One  Active  THN Long Term Goal   Over the next 31 days, patient will not experience hosital readmission, as evidenced by patient/ caregiver reporting and review of EMR during [place] outreach  [place] Term Goal Start Date  09/28/17  Interventions for Problem One Long Term Goal  Discussed with patient's caregiver patient's current clinical status,  reviewed upcoming provider appointments with caregiver and confirmed that apouse will provide transportation,  confirmed [facility] RN CCM initial home visit for next week  THN CM Short Term Goal #1   Over the next 30 days, patient will actively participate in home health services for nursing and PT, as evidenced by patient/ caregiver reporting and collaboration with [facility] staff as indicated, during [place]  outreach  [facility] CM Short Term Goal #1 Start Date  09/28/17  Interventions for Short Term Goal #1  Discussed with patient's caregiver recent home health visits and patient's participation in [facility] services,  encouraged patient's spouse to continue encouraging patient to actively participate in [place] Problem Two     Most Recent Value  Care Plan Problem Two  High Risk for falls, as evidenced by recent fall requiring hospitalization and SNF placement  Role Documenting the Problem Two  Care Management [place] for Problem Two  Active  Interventions for Problem Two Long Term Goal   Confirmed with patient's caregiver that patient has been consistently using walker and has not had any new/ recent/ or near falls,  reiterated basics of fall prevention  THN Long Term Goal  Over the next 60 days, patient will use assistive devices as prescribed post- SNF discharge, as evidenced by patient/ caregiver reporting during [facility] RN CM outreach  [facility] Long Term Goal Start Date  09/28/17     Oneta Rack, RN, BSN, [employer]  Coordinator [facility] Care Management  [phone]

## 2017-10-13 DIAGNOSIS — S82852D Displaced trimalleolar fracture of left lower leg, subsequent encounter for closed fracture with routine healing: Secondary | ICD-10-CM | POA: Diagnosis not present

## 2017-10-20 ENCOUNTER — Other Ambulatory Visit: Payer: Self-pay | Admitting: *Deleted

## 2017-10-20 ENCOUNTER — Encounter: Payer: Self-pay | Admitting: *Deleted

## 2017-10-20 NOTE — Patient Outreach (Signed)
[place] [facility]) Care Management  [place] Initial Home Visit, Transition of Care day 23  10/20/2017  Dana Perez [facility] 1941/01/21 [id number]  Dana Perez is a 77 y.o. female referred originally to [place] by insurance plan; screening call by [facility] telephonic RN CM revealed recent hospitalization December 21-24, 2018 for bilateral ankle fractures; patient was discharged to SNF for rehabilitation, and discharged to home from SNF / self-care with home health [facility]) services in place, on September 26, 2017. Patient referred to [place] by [facility] CSW for transition of care following SNF discharge on 09/26/17.Patient has history including, but not limited to, HTN/ HLD; colon cancer and head/ neck cancer.HIPAA/ identity verified with patient in person at her home today, and patient's spouse, caregiver, on [facility] CM written consent is also present and actively participates in all aspects of today's home visit.  Pleasant 80 minute home visit.  Subjective: "I think I am going to get better; it's just so hard waiting for it to happen."  Assessment:  Dana Perez appears to be recuperating well after her recent hospital/ SNF visit, and she denies pain and concerns around her care.  Dana Perez has made progress with increasing her ambulation and has fully participated in home health services as ordered post-SNF discharge.  Dana Perez has a very supportive and actively involved spouse, who is her primary caregiver and she and caregiver both deny community resource needs.  Dana Perez and her spouse would like to create Advanced Directives for [place] and Living Will.  Dana Perez is a high fall risk due to recent fall with bilateral ankle fractures, and ongoing/ intermittent periods of weakness and low blood pressures around activity.  Today,patient reports that she "is doing pretty good today," and she denies pain/ discomfort, and appears to be in no obvious distress throughout home visit.  Patient and spouse note  that patient has had periods of "chemo- cloud brain," and state that patient has had difficulty remembering things, which frustrates her; however, she and spouse both report that they "were prepared" for this by staff at [facility], "which makes it better."  Reports ongoing (L) anterior foot swelling; states this was evaluated recently by orthopedic provider; patient is wearing prescribed ankle brace, and has her foot elevated on couch.  Patient and spouse/ caregiver further reports:  Medications: -- Has all medicationsandcontinuestakingas prescribed;denies questions/ concerns aroundcurrent medications.  -- reports PCP "decreased" patient's BP medication around recent/ intermittent episodes low blood pressures; medication list in EMR updated -- patient's spouse continues managing all aspects of patient's medications, including preparing and providing patient's medications  -- denies difficulty with swallowing medications -- in-home medication review was completed; no discrepancies discovered during medication review and patient's weekly pill-planner box was filled correctly  Home health [facility]) services: -- [place] [facility] for PT, OT, [facility] and [facility]; reports visits "going just fine;" states that "someone comes about every single day." -- patient has actively participated in all disciplines of home health services -- home health services "will likely be ending soon;" reports they were told by home health nurse and PT that patient has "one-two weeks" of services remaining; patient and spouse feel that patient is ready for discharge, stating that patient has progressively gotten stronger and spouse continues providing daily care as needed for patient -- discussed with patient and spouse process for extending/ resuming home health services, should they end, and patient/ spouse think patient could benefit from ongoing services   Provider  appointments: -- All recent/ upcoming provider appointments were reviewed with patient and her spouse today; spouse continues providing transportation for patient to all provider appointments/ errands; both spouse and patient deny community resource needs  Safety/ Mobility/ Falls: -- denies new/ recent fallssince SNF discharge/ last [facility] CCM outreach -- assistive devices:continuesuses walker "pretty much all the time" since recent bilateral ankle fractures; states that [facility] PT has coached spouse in use of gait belt, which patient/ spouse both report "is working good."  Patient also has cane/ wheelchair present in home for patient's use as needed -- no obvious fall risks/ hazards noted in patient's home: home environment is uncluttered and clean -- printed EMMI educational material on fall risks/ prevention provided and thoroughly reviewed today  Patient and her spousedeny further issues, concerns, or problems today.I confirmed that patient/ spouse have my direct phone number, the main [facility] CM office phone number, and the [facility] CM 24-hour nurse advice phone number should issues arise prior to next scheduled [place] outreach by phone next week.  Encouraged patient/ spouse to contact me directly if needs, questions, issues, or concerns arise prior to next scheduled outreach; both agreed to do so.  Objective:    BP 116/60   Pulse 72   Resp 18   Ht 1.626 m (5\' 4" )   Wt 113 lb (51.3 kg)   SpO2 95%   BMI 19.40 kg/m   Review of Systems  Constitutional: Negative.   Respiratory: Negative.  Negative for cough, shortness of breath and wheezing.   Cardiovascular: Positive for leg swelling. Negative for chest pain, palpitations and claudication.       (L) anterior foot swelling at +2;  (L) ankle brace on No (R) LE swelling noted  Gastrointestinal: Negative.  Negative for abdominal pain and nausea.  Genitourinary: Negative.   Musculoskeletal: Negative.  Negative for falls.  Neurological:  Negative.  Negative for dizziness.  Psychiatric/Behavioral: Negative.  Negative for depression. The patient is not nervous/anxious.    Physical Exam  Constitutional: She is oriented to person, place, and time. She appears well-developed and well-nourished. No distress.  Cardiovascular: Normal rate, regular rhythm, normal heart sounds and intact distal pulses.  Pulses:      Radial pulses are 2+ on the right side, and 2+ on the left side.       Dorsalis pedis pulses are 2+ on the right side, and 1+ on the left side.  Respiratory: Effort normal and breath sounds normal. No respiratory distress. She has no wheezes. She has no rales.  GI: Soft. Bowel sounds are normal.  Musculoskeletal: She exhibits edema.  See ROS  Neurological: She is alert and oriented to person, place, and time.  Skin: Skin is warm and dry. No erythema.  Psychiatric: She has a normal mood and affect. Her behavior is normal. Judgment and thought content normal.   Encounter Medications:   Outpatient Encounter Medications as of 10/20/2017  Medication Sig Note  . acetaminophen (TYLENOL) 500 MG tablet Take 500 mg by mouth every 6 (six) hours as needed. 10/20/2017: Uses prn   . amLODipine (NORVASC) 10 MG tablet Take 1 tablet by mouth daily. 10/20/2017: Reports dose decreased by PCP:  Patient now taking 5 mg po QD  . calcium carbonate (OSCAL) 1500 (600 Ca) MG TABS tablet Take 600 mg of elemental calcium by mouth daily with breakfast.   . dexamethasone (DECADRON) 4 MG tablet Take 1 tablet (4 mg total) by mouth daily.   . pantoprazole (PROTONIX) 40 MG  tablet Take 40 mg by mouth 2 (two) times daily.   . pravastatin (PRAVACHOL) 10 MG tablet Take 10 mg by mouth daily.   Marland Kitchen enoxaparin (LOVENOX) 40 MG/0.4ML injection Inject 0.4 mLs (40 mg total) into the skin daily. (Patient not taking: Reported on 09/28/2017) 09/28/2017: Patient's caregiver reports no longer taking  . ondansetron (ZOFRAN) 8 MG tablet TAKE 1 TABLET (8 MG TOTAL) 2 (TWO) TIMES  DAILY AS NEEDED BY MOUTH. 09/28/2017: Reports taking BID  . oxyCODONE (OXY IR/ROXICODONE) 5 MG immediate release tablet Take 1-2 tablets (5-10 mg total) by mouth every 6 (six) hours as needed for moderate pain or breakthrough pain ((for MODERATE breakthrough pain)). (Patient not taking: Reported on 09/28/2017)   . prochlorperazine (COMPAZINE) 10 MG tablet TAKE 1 TABLET (10 MG TOTAL) EVERY 6 (SIX) HOURS AS NEEDED BY MOUTH (NAUSEA OR VOMITING).   Marland Kitchen sucralfate (CARAFATE) 1 g tablet Take 1 tablet (1 g total) by mouth 3 (three) times daily. Dissolve in 2-3 tbsp warm water, swish and swallow. (Patient not taking: Reported on 10/20/2017)    Functional Status:   In your present state of health, do you have any difficulty performing the following activities: 10/12/2017 09/28/2017  Hearing? N -  Comment per report of spouse, on [facility] CM written consent -  Vision? N -  Comment per report of spouse, on [facility] CM written consent -  Difficulty concentrating or making decisions? Y -  Comment per report of spouse, on [facility] CM written consent -  Walking or climbing stairs? - Y  Comment - per patient's spouse, whom patient provided verbal permission for me to speak with today; "patient goes slow"  Dressing or bathing? - Y  Comment - per patient's spouse, whom patient provided verbal permission for me to speak with today, spouse provides assistance  Doing errands, shopping? - Y  Comment - per patient's spouse, whom patient provided verbal permission for me to speak with today, spouse provides assistance  Preparing Food and eating ? - Y  Comment - per patient's spouse, whom patient provided verbal permission for me to speak with today, spouse assists  Using the Toilet? - Y  Comment - per patient's spouse, whom patient provided verbal permission for me to speak with today, spouse assists  In the past six months, have you accidently leaked urine? - Y  Comment - per patient's spouse, whom patient provided verbal permission for  me to speak with today, patient wears depends  Do you have problems with loss of bowel control? - N  Comment - per patient's spouse, whom patient provided verbal permission for me to speak with today, spouse assists  Managing your Medications? - Y  Comment - per patient's spouse, whom patient provided verbal permission for me to speak with today, spouse assists  Managing your Finances? - Y  Comment - per patient's spouse, whom patient provided verbal permission for me to speak with today, spouse assists  Housekeeping or managing your Housekeeping? - Y  Comment - per patient's spouse, whom patient provided verbal permission for me to speak with today, spouse assists  Some recent data might be hidden   Fall/Depression Screening:    Fall Risk  10/20/2017 10/12/2017 10/05/2017  Falls in the past year? (No Data) (No Data) (No Data)  Comment Patient/ caregiver deny new/ recent falls No new/ recent falls, per report of spouse, on [facility] CM written consent per husband/ caregiver, on [facility] CM written consent, "No new or recent falls"  Number falls in  past yr: - - -  Comment - - -  Injury with Fall? - - -  Comment - - -  Risk Factor Category  - - -  Risk for fall due to : History of fall(s);Impaired balance/gait;Impaired mobility - -  Risk for fall due to: Comment - - -  Follow up Falls prevention discussed;Education provided - -  Comment - - -   PHQ 2/9 Scores 10/20/2017 07/06/2017  PHQ - 2 Score 0 0   Plan:  Patient will take medications as prescribed and will attend all scheduled provider appointments  Patient will actively participate in home health services as ordered post-hospital discharge  Patient will continue using assistive devices for ambulation  Patient/ spouse will promptly notify patient's care providers for any new concerns, issues, or problems that arise  [facility] Community CM outreach for ongoing transition of care to continue with scheduled telephone call next week   [facility] CM  Care Plan Problem One     Most Recent Value  Care Plan Problem One  Risk for hospital readmission, as evidenced by recent hospital and SNF admissions  Role Documenting the Problem One  Care Management Coordinator  Care Plan for Problem One  Active  THN Long Term Goal   Over the next 31 days, patient will not experience hosital readmission, as evidenced by patient/ caregiver reporting and review of EMR during [place] outreach  [facility] Long Term Goal Start Date  09/28/17  Interventions for Problem One Long Term Goal  [facility] Community CM Initial home visit completed,  in-home medication review completed,  physical assessment completed,  reviewed all recent and upcoming provider appointments with patient and spouse  THN CM Short Term Goal #1   Over the next 30 days, patient will actively participate in home health services for nursing and PT, as evidenced by patient/ caregiver reporting and collaboration with [facility] staff as indicated, during [place]  outreach  [facility] CM Short Term Goal #1 Start Date  09/28/17  Interventions for Short Term Goal #1  Discussed with patient and caregiver recent home health visits and patient's participation in [facility] services,  discussed upcoming discharge from home health services, coached patient and spouse around how to resume [facility] services if they feel it would be beneficial/ necessary    [facility] CM Care Plan Problem Two     Most Recent Value  Care Plan Problem Two  High Risk for falls, as evidenced by recent fall requiring hospitalization and SNF placement  Role Documenting the Problem Two  Care Management [place] for Problem Two  Active  Interventions for Problem Two Long Term Goal   Provided and thoroughly reviewed printed EMMI educational materials around fall prevention with patient and spouse,  Using teachback method, discussed fall prevention stratgies with patient and caregiver  THN Long Term Goal  Over the next 60 days, patient will use assistive devices as  prescribed post- SNF discharge, as evidenced by patient/ caregiver reporting during [place] CM outreach  [facility] Long Term Goal Start Date  09/28/17    [facility] CM Care Plan Problem Three     Most Recent Value  Care Plan Problem Three  Need for advanced directive planning, as evidenced by patient/ spouse reporting of same  Role Documenting the Problem Three  Care Management [place] for Problem Three  Active  THN Long Term Goal   Over the next 38 days, patient and caregiver will review Advanced Directive planning packet  and Event organiser, as evidenced by patient and caregiiver reporting during [place] outreach  [place] Term Goal Start Date  10/20/17  Interventions for Problem Three Long Term Goal  Using teachback method, provided and thoroughly discussed with patient and her spouse printed EMMI educational material on AD planning,  provided book "Hard Choices for [employer]," as well as AD planning packet     Oneta Rack, RN, BSN, [place] Coordinator [facility] Care Management  [phone]

## 2017-10-23 ENCOUNTER — Other Ambulatory Visit: Payer: Self-pay | Admitting: *Deleted

## 2017-10-28 ENCOUNTER — Ambulatory Visit: Payer: Self-pay | Admitting: Surgery

## 2017-10-28 ENCOUNTER — Encounter: Payer: Self-pay | Admitting: Surgery

## 2017-10-28 ENCOUNTER — Ambulatory Visit (INDEPENDENT_AMBULATORY_CARE_PROVIDER_SITE_OTHER): Payer: PPO | Admitting: Surgery

## 2017-10-28 ENCOUNTER — Ambulatory Visit: Payer: Self-pay | Admitting: Radiation Oncology

## 2017-10-28 ENCOUNTER — Encounter: Payer: Self-pay | Admitting: *Deleted

## 2017-10-28 ENCOUNTER — Other Ambulatory Visit: Payer: Self-pay | Admitting: *Deleted

## 2017-10-28 VITALS — BP 118/74 | HR 83 | Temp 98.0°F | Ht 64.0 in | Wt 119.0 lb

## 2017-10-28 DIAGNOSIS — C189 Malignant neoplasm of colon, unspecified: Secondary | ICD-10-CM | POA: Diagnosis not present

## 2017-10-28 NOTE — Progress Notes (Signed)
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 She also has concomitant adenocarcinoma of the right side  That was recently discovered after polypectomy. She is currently doing physical therapy and is starting to walk now. Uses a walker, Her husband tell me that now she has some mild cognitive difficulties.  She still debilitated but has improved when compared to my last visit The chemo radiation has worked clinically PET in a few days  PE : debilitated  pt in [place] a wheelchair Neck Right neck mass solid, decrease in size as compared to last exam Abd: soft nt, no masses  A/P patient with adenocarcinoma currently   debilitated  But improving. Will order a new set of labs including a CMP, INR and PTT.  I will see her back in 2 weeks after she completes her PET/CT and determine the best course of action.  She would likely benefit from a right colectomy and more than likely because of her debility multiple comorbidities may need diverting ileostomy to protect the anastomosis. The understands and agrees to come back in 2 weeks. . I have spent at least 25 minutes in this encounter with greater than 50% spent in counseling and coordination of her care

## 2017-10-28 NOTE — Patient Outreach (Signed)
[place] [facility]) Care Management [place] Telephone Outreach, Transition of Care day 31   10/28/2017  [name] [facility] May 07, 1941 [id number]  Successful telephone outreach to Vicki Mallet, spouse/ caregiiver, on [facility] CM written consent, of Dana Perez, 77 y.o. female referred originally to [facility] CM by insurance plan; screening call by [facility] telephonic RN CM revealed recent hospitalization December 21-24, 2018 for bilateral ankle fractures; patient was discharged to SNF for rehabilitation, and discharged to home from SNF / self-care with home health [facility] services in place, on September 26, 2017. Patient referred to [place] by [facility] CSW for transition of care following SNF discharge on 09/26/17.Patient has history including, but not limited to, HTN/ HLD; colon cancer and head/ neck cancer.HIPAA/ identity verified with patient's spouse during phone call today.  Today,caregiver reports that patient "is having a good day," and he denies that patient is in pain/ distress/ discomfort.  Denies that patient has any new/recent falls, however, he stated that patient "had a near-fall" last week, when her feet slipped on their hardwood floors; spouse reports that he was "right there" with patient, and a fall was prevented; reports this is the "only" near fall patient has experienced; and he confirms that patient was using her walker when this episode occurred.  Positive reinforcement provided to caregiver for continuing to be diligent with his ongoing presence when patient ambulates.  Reports patient continues to complain of being "weak in her legs," and that her (L) ankle swelling "is about the same, maybe slightly better."  Reports patient continues keeping (L) ankle/ foot elevated as much as possible.  Caregiver further reports:  -- has all medications and is taking as prescribed; denies recent changes to medications, and confirms that he continues providing patient's  medications to her when they are due, and continues filling weekly pill box.  -- home health [facility]) services "to be ending this week."  Reports that patient was visited earlier in week by [facility] PT and OT; states bath aide will be coming later this week; discussed with patient's spouse to contact providers if he/ patient believe would benefit from ongoing [facility] services; discussed process to have home health services extended; discussed with caregiver that he could contact me if he/ patient would like assistance in this process once [facility] services are complete  -- has reviewed Advanced Directives independently-- states patient continues to experience periods of "chemo- cloud brain," which frustrates both her and spouse;  Rosezena Sensor plans to discuss her wishes around AD planning with patient once this improves  -- provider appointments reviewed; spouse states he will be transporting patient to oncology provider office visit later this afternoon and to PET scan on Monday 11/02/17; states he plans to discuss patient's ongoing "chemo-cloud" frustrations with oncology provider  Patient's caregiver denies further issues, concerns, or problems today.I confirmed that patient/ spouse havemy direct phone number, the main [facility] CM office phone number, and the [facility] CM 24-hour nurse advice phone number should issues arise prior to next scheduled [place] outreach by phone in 2 weeks.  Encouraged patient/ spouse to contact me directly if needs, questions, issues, or concerns arise prior to next scheduled outreach; both agreed to do so.  Plan:  Patient will take medications as prescribed and will attend all scheduled provider appointments  Patient will actively participate in home health services as ordered post-hospital discharge as long as they are active in patient's care  Patient willcontinueusingassistive devices for ambulation  Patient/ spouse will promptly  notify patient's care providers for any new  concerns, issues, or problems that arise  [facility] Community CM outreach to continue with scheduled telephone callin 2 weeks  [facility] CM Care Plan Problem One     Most Recent Value  Care Plan Problem One  Risk for hospital readmission, as evidenced by recent hospital and SNF admissions  Role Documenting the Problem One  Care Management [place] for Problem One  Not Active  THN Long Term Goal   Over the next 31 days, patient will not experience hosital readmission, as evidenced by patient/ caregiver reporting and review of EMR during [facility] RN CCM outreach  [facility] Long Term Goal Start Date  09/28/17  [facility] Long Term Goal Met Date  10/28/17 - Goal met  THN CM Short Term Goal #1   Over the next 30 days, patient will actively participate in home health services for nursing and PT, as evidenced by patient/ caregiver reporting and collaboration with [facility] staff as indicated, during [place]  outreach  [facility] CM Short Term Goal #1 Start Date  09/28/17  [facility] CM Short Term Goal #1 Met Date  10/28/17 - Goal met    [facility] CM Care Plan Problem Two     Most Recent Value  Care Plan Problem Two  High Risk for falls, as evidenced by recent fall requiring hospitalization and SNF placement  Role Documenting the Problem Two  Care Management Coordinator  Care Plan for Problem Two  Active  Interventions for Problem Two Long Term Goal   Confirmed with patient's spouse/ caregiver that patient continues using assistive devices for ambulation and that she has experienced no new/ recent falls,  reinforced previously provided education on fall prevention  THN Long Term Goal  Over the next 60 days, patient will use assistive devices as prescribed post- SNF discharge, as evidenced by patient/ caregiver reporting during [facility] RN CM outreach  [facility] Long Term Goal Start Date  09/28/17    [facility] CM Care Plan Problem Three     Most Recent Value  Care Plan Problem Three  Need for advanced directive planning, as evidenced by patient/ spouse  reporting of same  Role Documenting the Problem Three  Care Management Coordinator  Care Plan for Problem Three  Active  THN Long Term Goal   Over the next 38 days, patient and caregiver will review Advanced Directive planning packet and printed educational material, as evidenced by patient and caregiiver reporting during [place] outreach  [facility] Long Term Goal Start Date  10/20/17  Interventions for Problem Three Long Term Goal  Discussed with patient's spouse previously provided educational material on AD planning,  discussed patient's spouse review of educational material and options for obtaining notary to complete AD's,  discussed patient's caregiver plan to review with patient as she continues to recuperate     Oneta Rack, RN, BSN, [place] Care Management  [phone]

## 2017-10-28 NOTE — Patient Instructions (Addendum)
We have ordered some labs to be drawn today. Please proceed to the [facility] to have these tests completed prior to leaving today. You will check in at the registration desk in the medical mall. Please see walking directions below if needed.  We will call you with the results and next step in plan of care as soon as results are received.   We will see you back as listed below:

## 2017-10-29 ENCOUNTER — Telehealth: Payer: Self-pay | Admitting: General Practice

## 2017-10-29 NOTE — Telephone Encounter (Signed)
Patient's husband called and stated that she is having some lab work at the cancer center on Monday prior to her Pet Scan. He wanted to know if it would be okay for her to have her labs completed after her PET Scan. I verbalized to him that should be fine if she is feeling up to having them completed again that day. He verbalized understanding.

## 2017-10-29 NOTE — Telephone Encounter (Signed)
ERROR

## 2017-10-29 NOTE — Telephone Encounter (Signed)
Patients husband is calling asking about his wife having labs done, he is a little confused on when he can go, but he also said his wife is going to the cancer center to have blood work as well. Please call patients husband and advise.

## 2017-11-01 NOTE — Progress Notes (Signed)
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  INTERVAL HISTORY: Patient returns to clinic today for further evaluation and discussion of her PET scan results.  Despite not having treatment for over a month, her performance status has declined.  Her husband reports she is more confused.  She has increased weakness and fatigue and a poor appetite.  She appears anxious and depressed.  She has no focal neurologic complaints. She has no chest pain or shortness of breath. She denies any nausea, vomiting, constipation, or diarrhea.  She has no melena or hematochezia.  She has no urinary complaints. Patient offers no further specific complaints today.  REVIEW OF SYSTEMS:   Review of Systems  Constitutional: Positive for malaise/fatigue. Negative for fever and weight loss.  HENT: Negative.  Negative for sore throat.   Respiratory: Negative.  Negative for cough and shortness of breath.   Cardiovascular: Negative.  Negative for chest pain and leg swelling.  Gastrointestinal: Negative.  Negative for abdominal pain, constipation, nausea and vomiting.  Genitourinary: Negative.   Musculoskeletal: Positive for joint pain. Negative for falls.  Skin: Negative.  Negative for rash.  Neurological: Positive for weakness and headaches. Negative for sensory change and focal weakness.  Psychiatric/Behavioral: Positive for depression and memory loss. The patient is nervous/anxious and has insomnia.     As per HPI. Otherwise, a complete review of systems is negative.  PAST MEDICAL HISTORY: Past Medical History:  Diagnosis Date  . Adenocarcinoma of colon ([place])   . Benign  neoplasm of ascending colon   . Essential hypertension 06/13/2014  . Goals of care, counseling/discussion 07/14/2017  . HLD (hyperlipidemia) 06/13/2014  . Hypercholesteremia   . Hyperlipidemia   . Multiple thyroid nodules 01/22/2012  . Osteoporosis   . Polycythemia, secondary 12/26/2014  . Pure hypercholesterolemia 03/15/2015  . Thyroid nodule     PAST SURGICAL HISTORY: Past Surgical History:  Procedure Laterality Date  . ABDOMINAL HYSTERECTOMY    . COLONOSCOPY WITH PROPOFOL N/A 07/02/2017   Procedure: COLONOSCOPY WITH PROPOFOL;  Surgeon: Lucilla Lame, MD;  Location: [facility];  Service: Endoscopy;  Laterality: N/A;  . ORIF ANKLE FRACTURE Left 08/08/2017   Procedure: OPEN REDUCTION INTERNAL FIXATION (ORIF) ANKLE FRACTURE;  Surgeon: Dereck Leep, MD;  Location: [facility];  Service: Orthopedics;  Laterality: Left;  . POLYPECTOMY N/A 07/02/2017   Procedure: POLYPECTOMY;  Surgeon: Lucilla Lame, MD;  Location: [facility];  Service: Endoscopy;  Laterality: N/A;  . WRIST FRACTURE SURGERY  08/2009   badly broken    FAMILY HISTORY Family History  Problem Relation Age of Onset  . Heart disease Mother   . Diabetes Father        ADVANCED DIRECTIVES:    HEALTH MAINTENANCE: Social History   Tobacco Use  . Smoking status: Current Every Day Smoker    Packs/day: 0.25    Types: Cigarettes  . Smokeless tobacco: Never Used  . Tobacco comment: occasional smoker  Substance Use Topics  . Alcohol use: No  . Drug use: No     Allergies  Allergen Reactions  . Other Other (See Comments) and Rash    TDAP  . Tetanus Antitoxin Rash    Current Outpatient Medications  Medication Sig Dispense Refill  . acetaminophen (TYLENOL) 500 MG tablet Take 500 mg by mouth every 6 (six) hours as needed.    Marland Kitchen amLODipine (NORVASC) 5 MG tablet Take by mouth.    . calcium carbonate (OSCAL) 1500 (600 Ca) MG TABS tablet Take 600 mg of elemental calcium by mouth daily with breakfast.    .  dexamethasone (DECADRON) 4 MG tablet Take 1 tablet (4 mg total) by mouth daily. 30 tablet 0  . pantoprazole (PROTONIX) 40 MG tablet Take 40 mg by mouth 2 (two) times daily.    . pravastatin (PRAVACHOL) 10 MG tablet Take 10 mg by mouth daily.    . ondansetron (ZOFRAN) 8 MG tablet TAKE 1 TABLET (8 MG TOTAL) 2 (TWO) TIMES DAILY AS NEEDED BY MOUTH.  2  . oxyCODONE (OXY IR/ROXICODONE) 5 MG immediate release tablet Take 1-2 tablets (5-10 mg total) by mouth every 6 (six) hours as needed for moderate pain or breakthrough pain ((for MODERATE breakthrough pain)). (Patient not taking: Reported on 11/05/2017) 15 tablet 0  . prochlorperazine (COMPAZINE) 10 MG tablet TAKE 1 TABLET (10 MG TOTAL) EVERY 6 (SIX) HOURS AS NEEDED BY MOUTH (NAUSEA OR VOMITING).  2   No current facility-administered medications for this visit.    Facility-Administered Medications Ordered in Other Visits  Medication Dose Route Frequency Provider Last Rate Last Dose  . heparin lock flush 100 unit/mL  250 Units Intracatheter PRN Leia Alf, MD      . heparin lock flush 100 unit/mL  500 Units Intracatheter PRN Leia Alf, MD      . sodium chloride 0.9 % injection 10 mL  10 mL Intracatheter PRN Leia Alf, MD      . sodium chloride 0.9 % injection 10 mL  10 mL Intracatheter PRN Leia Alf, MD      . sodium chloride 0.9 % injection 10 mL  10 mL Intracatheter PRN Ma Hillock, Sandeep, MD      . sodium chloride 0.9 % injection 10 mL  10 mL Intracatheter PRN Leia Alf, MD      -year-old  OBJECTIVE: There were no vitals filed for this visit.   There is no height or weight on file to calculate BMI.    ECOG FS:0 - Asymptomatic  General: Well-developed, well-nourished, no acute distress. Eyes: Pink conjunctiva, anicteric sclera.  Stye on left eyelid. HEENT: Lymphadenopathy in left neck no longer palpable. Lungs: Clear to auscultation bilaterally. Heart: Regular rate and rhythm. No rubs, murmurs, or gallops. Abdomen:  Soft, nontender, nondistended. No organomegaly noted, normoactive bowel sounds. Musculoskeletal: No edema, cyanosis, or clubbing. Neuro: Alert, appears confused. Cranial nerves grossly intact. Skin: No rashes or petechiae noted. Psych: Anxious.   LAB RESULTS:  Lab Results  Component Value Date   NA 133 (L) 11/02/2017   K 3.8 11/02/2017   CL 98 (L) 11/02/2017   CO2 24 11/02/2017   GLUCOSE 90 11/02/2017   BUN 17 11/02/2017   CREATININE 0.73 11/02/2017   CALCIUM 9.5 11/02/2017   PROT 7.2 11/02/2017   ALBUMIN 4.1 11/02/2017   AST 29 11/02/2017   ALT 22 11/02/2017   ALKPHOS 71 11/02/2017   BILITOT 0.5 11/02/2017   GFRNONAA >60 11/02/2017   GFRAA >60 11/02/2017    Lab Results  Component Value Date   WBC 12.7 (H) 11/02/2017   NEUTROABS 12.0 (H) 11/02/2017   HGB 14.6 11/02/2017   HCT 44.9 11/02/2017   MCV 97.2 11/02/2017   PLT 198 11/02/2017     STUDIES: Nm  Pet Image Restag (ps) Skull Base To Thigh  Result Date: 11/02/2017 CLINICAL DATA:  Subsequent treatment strategy for squamous cell carcinoma of the neck. Colon cancer. EXAM: NUCLEAR MEDICINE PET SKULL BASE TO THIGH TECHNIQUE: 6.2 mCi F-18 FDG was injected intravenously. Full-ring PET imaging was performed from the skull base to thigh after the radiotracer. CT data was obtained and used for attenuation correction and anatomic localization. Fasting blood glucose: 97 mg/dl Mediastinal blood pool activity: SUV max 2.4 COMPARISON:  06/22/2017 FINDINGS: NECK: The right level 1b mass measures 2.2 by 1.5 cm (formerly 2.9 by 2.3 cm) and has a maximum standard uptake value of 10.3 (formerly 16.1). The small lymph node interposed between the parotid and right submandibular glands previously measured 0.6 cm in short axis and currently measures 0.4 cm. This structure reportedly previously had a metabolic activity of 3.5, currently 1.5. As before, a discrete mucosal lesion is not seen. Incidental CT findings: Right mastoid effusion. Bilateral  carotid atherosclerotic calcification. CHEST: No significant hypermetabolic activity in the chest. Incidental CT findings: Coronary, aortic arch, and branch vessel atherosclerotic vascular disease. Small pericardial effusion settled posteriorly. Trace pleural fluid bilaterally. Old granulomatous disease. Centrilobular emphysema. ABDOMEN/PELVIS: No significant abnormal hypermetabolic activity in the abdomen/pelvis. Incidental CT findings: Aortoiliac atherosclerotic vascular disease. Infrarenal abdominal aortic ectasia without overt aneurysm. SKELETON: No significant hypermetabolic activity. Incidental CT findings: none IMPRESSION: 1. Improved appearance with the right level 1b mass reduced in size and metabolic activity compared to the prior exam. There is still residual abnormal activity, with maximum SUV currently 10.3. No findings of additional spread of disease. 2. Other imaging findings of potential clinical significance: Right mastoid effusion. Aortic Atherosclerosis (ICD10-I70.0) and Emphysema (ICD10-J43.9). Coronary atherosclerosis. Small pericardial effusion and trace bilateral pleural effusions. Electronically Signed   By: Van Clines M.D.   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 Stage IVa head and neck squamous cell carcinoma: PET scan results from November 02, 2017 reviewed independently and report as above with significant improvement appearance compared to previous exam of the residual abnormal metabolic activity.  This is possibly related to inflammation from recent XRT.  No intervention is needed at this time.  Patient will require ENT follow-up in the near future and will repeat imaging in approximately 2-3 months.   2.  Adenocarcinoma of the colon: Patient has appointment with surgery later this week.  Previously, she requested to delay surgery or treatment until after her head and neck treatments are  complete.  CEA is within normal limits.  PET scan as above. 3.  Fractured ankles: Continue follow-up and treatment per orthopedics. 4.  Confusion, anxiety, depression: Will get an MRI of the brain this week.  If MRI brain is negative, patient may require psychiatry referral.  Return to clinic in 1 week to discuss the results.  Approximately 30 minutes was spent in discussion of which greater than 50% was consultation.   [name], MD 11/08/17 7:55 AM

## 2017-11-02 ENCOUNTER — Ambulatory Visit
Admission: RE | Admit: 2017-11-02 | Discharge: 2017-11-02 | Disposition: A | Discharge status: Home / Self Care | Payer: PPO | Source: Ambulatory Visit | Attending: Oncology | Admitting: Oncology

## 2017-11-02 ENCOUNTER — Inpatient Hospital Stay: Payer: PPO | Attending: Oncology

## 2017-11-02 ENCOUNTER — Other Ambulatory Visit
Admission: RE | Admit: 2017-11-02 | Discharge: 2017-11-02 | Disposition: A | Discharge status: Home / Self Care | Payer: PPO | Source: Ambulatory Visit | Attending: Surgery | Admitting: Surgery

## 2017-11-02 DIAGNOSIS — R413 Other amnesia: Secondary | ICD-10-CM | POA: Diagnosis not present

## 2017-11-02 DIAGNOSIS — G4700 Insomnia, unspecified: Secondary | ICD-10-CM | POA: Diagnosis not present

## 2017-11-02 DIAGNOSIS — R410 Disorientation, unspecified: Secondary | ICD-10-CM | POA: Insufficient documentation

## 2017-11-02 DIAGNOSIS — C760 Malignant neoplasm of head, face and neck: Secondary | ICD-10-CM | POA: Diagnosis not present

## 2017-11-02 DIAGNOSIS — C189 Malignant neoplasm of colon, unspecified: Secondary | ICD-10-CM | POA: Diagnosis not present

## 2017-11-02 DIAGNOSIS — R51 Headache: Secondary | ICD-10-CM | POA: Diagnosis not present

## 2017-11-02 DIAGNOSIS — R531 Weakness: Secondary | ICD-10-CM | POA: Diagnosis not present

## 2017-11-02 DIAGNOSIS — F419 Anxiety disorder, unspecified: Secondary | ICD-10-CM | POA: Diagnosis not present

## 2017-11-02 DIAGNOSIS — M2548 Effusion, other site: Secondary | ICD-10-CM | POA: Insufficient documentation

## 2017-11-02 DIAGNOSIS — J439 Emphysema, unspecified: Secondary | ICD-10-CM | POA: Insufficient documentation

## 2017-11-02 DIAGNOSIS — I700 Atherosclerosis of aorta: Secondary | ICD-10-CM | POA: Insufficient documentation

## 2017-11-02 DIAGNOSIS — R630 Anorexia: Secondary | ICD-10-CM | POA: Diagnosis not present

## 2017-11-02 DIAGNOSIS — C4442 Squamous cell carcinoma of skin of scalp and neck: Secondary | ICD-10-CM | POA: Diagnosis not present

## 2017-11-02 DIAGNOSIS — F329 Major depressive disorder, single episode, unspecified: Secondary | ICD-10-CM | POA: Insufficient documentation

## 2017-11-02 DIAGNOSIS — R5383 Other fatigue: Secondary | ICD-10-CM | POA: Diagnosis not present

## 2017-11-02 LAB — CBC WITH DIFFERENTIAL/PLATELET
BASOS PCT: 0 %
BASOS PCT: 0 %
Basophils Absolute: 0 10*3/uL (ref 0–0.1)
Basophils Absolute: 0 10*3/uL (ref 0–0.1)
EOS ABS: 0 10*3/uL (ref 0–0.7)
EOS ABS: 0 10*3/uL (ref 0–0.7)
EOS PCT: 0 %
Eosinophils Relative: 0 %
HCT: 43.3 % (ref 35.0–47.0)
HEMATOCRIT: 44.9 % (ref 35.0–47.0)
HEMOGLOBIN: 14.7 g/dL (ref 12.0–16.0)
Hemoglobin: 14.6 g/dL (ref 12.0–16.0)
LYMPHS ABS: 0.6 10*3/uL — AB (ref 1.0–3.6)
Lymphocytes Relative: 5 %
Lymphocytes Relative: 4 %
Lymphs Abs: 0.5 10*3/uL — ABNORMAL LOW (ref 1.0–3.6)
MCH: 32.8 pg (ref 26.0–34.0)
MCH: 31.5 pg (ref 26.0–34.0)
MCHC: 33.9 g/dL (ref 32.0–36.0)
MCHC: 32.5 g/dL (ref 32.0–36.0)
MCV: 96.8 fL (ref 80.0–100.0)
MCV: 97.2 fL (ref 80.0–100.0)
MONOS PCT: 2 %
Monocytes Absolute: 0.3 10*3/uL (ref 0.2–0.9)
Monocytes Absolute: 0.2 10*3/uL (ref 0.2–0.9)
Monocytes Relative: 2 %
NEUTROS ABS: 12 10*3/uL — AB (ref 1.4–6.5)
NEUTROS PCT: 93 %
Neutro Abs: 11.4 10*3/uL — ABNORMAL HIGH (ref 1.4–6.5)
Neutrophils Relative %: 94 %
PLATELETS: 185 10*3/uL (ref 150–440)
Platelets: 198 10*3/uL (ref 150–440)
RBC: 4.47 MIL/uL (ref 3.80–5.20)
RBC: 4.62 MIL/uL (ref 3.80–5.20)
RDW: 14.5 % (ref 11.5–14.5)
RDW: 14.9 % — AB (ref 11.5–14.5)
WBC: 12.3 10*3/uL — AB (ref 3.6–11.0)
WBC: 12.7 10*3/uL — ABNORMAL HIGH (ref 3.6–11.0)

## 2017-11-02 LAB — COMPREHENSIVE METABOLIC PANEL
ALBUMIN: 4.1 g/dL (ref 3.5–5.0)
ALT: 22 U/L (ref 14–54)
ANION GAP: 11 (ref 5–15)
AST: 29 U/L (ref 15–41)
Alkaline Phosphatase: 71 U/L (ref 38–126)
BILIRUBIN TOTAL: 0.5 mg/dL (ref 0.3–1.2)
BUN: 17 mg/dL (ref 6–20)
CO2: 24 mmol/L (ref 22–32)
Calcium: 9.5 mg/dL (ref 8.9–10.3)
Chloride: 98 mmol/L — ABNORMAL LOW (ref 101–111)
Creatinine, Ser: 0.73 mg/dL (ref 0.44–1.00)
GFR calc non Af Amer: >60 mL/min (ref >60)
GLUCOSE: 90 mg/dL (ref 65–99)
POTASSIUM: 3.8 mmol/L (ref 3.5–5.1)
Sodium: 133 mmol/L — ABNORMAL LOW (ref 135–145)
TOTAL PROTEIN: 7.2 g/dL (ref 6.5–8.1)

## 2017-11-02 LAB — GLUCOSE, CAPILLARY: Glucose-Capillary: 97 mg/dL (ref 65–99)

## 2017-11-02 LAB — MAGNESIUM: Magnesium: 2 mg/dL (ref 1.7–2.4)

## 2017-11-02 LAB — APTT: APTT: 25 s (ref 24–36)

## 2017-11-02 LAB — BASIC METABOLIC PANEL
Anion gap: 12 (ref 5–15)
BUN: 17 mg/dL (ref 6–20)
CHLORIDE: 99 mmol/L — AB (ref 101–111)
CO2: 26 mmol/L (ref 22–32)
CREATININE: 0.79 mg/dL (ref 0.44–1.00)
Calcium: 9.7 mg/dL (ref 8.9–10.3)
Glucose, Bld: 95 mg/dL (ref 65–99)
POTASSIUM: 3.9 mmol/L (ref 3.5–5.1)
SODIUM: 137 mmol/L (ref 135–145)

## 2017-11-02 LAB — PROTIME-INR
INR: 0.87
PROTHROMBIN TIME: 11.7 s (ref 11.4–15.2)

## 2017-11-02 MED ORDER — FLUDEOXYGLUCOSE F - 18 (FDG) INJECTION
6.2200 | Freq: Once | INTRAVENOUS | Status: AC | PRN
  Administered 2017-11-02: 6.22 via INTRAVENOUS

## 2017-11-02 NOTE — Addendum Note (Signed)
Addended by: Santiago Bur on: 11/02/2017 09:14 AM   Modules accepted: Orders

## 2017-11-05 ENCOUNTER — Inpatient Hospital Stay (HOSPITAL_BASED_OUTPATIENT_CLINIC_OR_DEPARTMENT_OTHER): Payer: PPO | Admitting: Oncology

## 2017-11-05 ENCOUNTER — Encounter: Payer: Self-pay | Admitting: Oncology

## 2017-11-05 ENCOUNTER — Other Ambulatory Visit: Payer: Self-pay

## 2017-11-05 ENCOUNTER — Encounter: Payer: Self-pay | Admitting: Radiation Oncology

## 2017-11-05 ENCOUNTER — Ambulatory Visit
Admission: RE | Admit: 2017-11-05 | Discharge: 2017-11-05 | Disposition: A | Discharge status: Home / Self Care | Payer: PPO | Source: Ambulatory Visit | Attending: Radiation Oncology | Admitting: Radiation Oncology

## 2017-11-05 VITALS — BP 143/83 | HR 82 | Temp 98.1°F | Resp 20 | Wt 120.9 lb

## 2017-11-05 DIAGNOSIS — F329 Major depressive disorder, single episode, unspecified: Secondary | ICD-10-CM

## 2017-11-05 DIAGNOSIS — R51 Headache: Secondary | ICD-10-CM | POA: Diagnosis not present

## 2017-11-05 DIAGNOSIS — R220 Localized swelling, mass and lump, head: Secondary | ICD-10-CM | POA: Insufficient documentation

## 2017-11-05 DIAGNOSIS — R413 Other amnesia: Secondary | ICD-10-CM

## 2017-11-05 DIAGNOSIS — C189 Malignant neoplasm of colon, unspecified: Secondary | ICD-10-CM | POA: Diagnosis not present

## 2017-11-05 DIAGNOSIS — M7989 Other specified soft tissue disorders: Secondary | ICD-10-CM | POA: Diagnosis not present

## 2017-11-05 DIAGNOSIS — R531 Weakness: Secondary | ICD-10-CM | POA: Diagnosis not present

## 2017-11-05 DIAGNOSIS — G4700 Insomnia, unspecified: Secondary | ICD-10-CM | POA: Diagnosis not present

## 2017-11-05 DIAGNOSIS — C760 Malignant neoplasm of head, face and neck: Secondary | ICD-10-CM

## 2017-11-05 DIAGNOSIS — R410 Disorientation, unspecified: Secondary | ICD-10-CM

## 2017-11-05 DIAGNOSIS — R5383 Other fatigue: Secondary | ICD-10-CM

## 2017-11-05 DIAGNOSIS — R630 Anorexia: Secondary | ICD-10-CM

## 2017-11-05 DIAGNOSIS — F419 Anxiety disorder, unspecified: Secondary | ICD-10-CM

## 2017-11-05 NOTE — Progress Notes (Signed)
Radiation Oncology Follow up Note  Name: Dana Perez   Date:   11/05/2017 MRN:  [id number] DOB: April 14, 1941    This 77 y.o. female presents to the clinic today for one-month follow-up status post concurrent chemoradiation therapy for locally advanced squamous cell carcinoma of the head and neck presenting as a right some and fibular gland lesion.Marland Kitchen  REFERRING PROVIDER: Glendon Axe, MD  HPI: patient is a 77 year old female now seen 1 month out having completed concurrent chemoradiation therapy for a large right submandibular squamous cell carcinoma..she received concurrent Erbitux with her treatments. She is seen today and is had marked reduction in the right sub-mandibular mass. She recently had a PET CT scan that showed still some present hypermetabolic activity in that region although much reduced in size . She is quite ill feeling today she states she is having some facial swelling some swelling in her lower extremities feels "quite fatigued Not sure the etiology of her medical condition.she is seeing medical oncology today.  COMPLICATIONS OF TREATMENT: none  FOLLOW UP COMPLIANCE: keeps appointments   PHYSICAL EXAM:  BP (!) 143/83   Pulse 82   Temp 98.1 F (36.7 C)   Resp 20   Wt 120 lb 14.8 oz (54.9 kg)   BMI 20.76 kg/m  She is on metastatic complete response by examination to the right neck and 70 mandibular region. Still some firmness in that area no other adenopathy is detected. Oral cavity is clear no oral mucosal lesions are identified. Well-developed well-nourished patient in NAD. HEENT reveals PERLA, EOMI, discs not visualized.  Oral cavity is clear. No oral mucosal lesions are identified. Neck is clear without evidence of cervical or supraclavicular adenopathy. Lungs are clear to A&P. Cardiac examination is essentially unremarkable with regular rate and rhythm without murmur rub or thrill. Abdomen is benign with no organomegaly or masses noted. Motor sensory and DTR  levels are equal and symmetric in the upper and lower extremities. Cranial nerves II through XII are grossly intact. Proprioception is intact. No peripheral adenopathy or edema is identified. No motor or sensory levels are noted. Crude visual fields are within normal range.  RADIOLOGY RESULTS: PET CT scan is reviewed and compatible with the above-stated findings  PLAN: at this time patient will be seeing medical oncology. She needs some workup and as to her overall ill appearance and complaints. Options for residual disease in her right neck may be surgical excision at this time. If this area persists and cannot be resected may opt first further radiation therapy to the area of hypermetabolic activity. I've set up a follow-up in one month. She is seeing medical oncology today for evaluation. Patient and husband know to call with any concerns.  I would like to take this opportunity to thank you for allowing me to participate in the care of your patient.Noreene Filbert, MD

## 2017-11-05 NOTE — Progress Notes (Signed)
Patient here to discuss test results.  Her husband brings in a list of concerns which consists having headaches, sty on left eye, fluid retention in hands and face.  Having episodes of confusion, crying, nervousness, and waking up at night.

## 2017-11-11 ENCOUNTER — Encounter: Payer: Self-pay | Admitting: Surgery

## 2017-11-11 ENCOUNTER — Encounter: Payer: Self-pay | Admitting: *Deleted

## 2017-11-11 ENCOUNTER — Other Ambulatory Visit: Payer: Self-pay | Admitting: *Deleted

## 2017-11-11 ENCOUNTER — Ambulatory Visit (INDEPENDENT_AMBULATORY_CARE_PROVIDER_SITE_OTHER): Payer: PPO | Admitting: Surgery

## 2017-11-11 VITALS — BP 123/76 | HR 86 | Temp 97.8°F | Wt 121.0 lb

## 2017-11-11 DIAGNOSIS — C189 Malignant neoplasm of colon, unspecified: Secondary | ICD-10-CM

## 2017-11-11 NOTE — Patient Instructions (Signed)
We will see you back in 3 weeks to make sure that you are doing well and to review your brain MRI.

## 2017-11-11 NOTE — Patient Outreach (Signed)
[facility]) Care Management [place] Telephone Outreach  11/11/2017  Hellen Shanley [facility] 02-Oct-1940 [id number]  Successful telephone outreach to [facility], spouse/ caregiiver, on [facility] CM written consent, of Dana Perez, 77 y.o.femalereferred originally to [place] by insurance plan; screening call by [facility] telephonic RN CM revealed recent hospitalization December 21-24, 2018 for bilateral ankle fractures; patient was discharged to SNF for rehabilitation, and discharged to home from SNF / self-care with home health [facility]) services in place, on September 26, 2017. Patient referred to [facility] by [facility] CSW for transition of care following SNF discharge on 09/26/17.Patient has history including, but not limited to, HTN/ HLD; colon cancer and head/ neck cancer.HIPAA/ identity verified with patient's spouse during phone call today.  Today,caregiverreports that patient "is having a hard time lately."  Reports that patient has become "increasingly emotional and confused at times; she seems anxious, is not sleeping well, is crying a lot, and seems nervous."  Patient's spouse has discussed this with patient's care providers during numerous office visits and has been told that this is a side-effect from chemotherapy, "chemo-cloud, or chemo brain."  Spouse reports that he believes this is actually "getting worse, not better," and states that he has continued to keep care providers updated.  Spouse denies that patient is in "regular/ ongoing pain," and he denies new/ recent falls.  Denies that patient is in distress, but states that "she just seems more upset and on edge than she was before."  Caregiver further reports:  -- has all medications and is taking as prescribed; denies recent changes to medications, and confirms that he continues providing patient's medications to her when they are due, and continues filling weekly pill box.  States that he has obtained new OTC  mouth rinse for ongoing "dry mouth."  -- home health [facility]) nursing services continue, but PT/ OT/ bath aide has ended; discussed with spouse possibility/ options for having additional home health services extended/ re-ordered, but spouse reports today that he is "so far" able to meet patient's daily care needs.  We discussed possibilities/ options around caregiver resources as well.  -- patient continues to complain of being "weak in her legs," and that her (L) ankle swelling "is worse this week than it has been."  Reports patient continues keeping (L) ankle/ foot elevated as much as possible.  Denies new/ recent falls, and reports that he has been able to continue assisting patient in mobility while she uses walker.  Discussed with patient's spouse his ability to continue assisting with patient's mobility without hurting himself, and he states that he thinks he "is okay" for now.  Encouraged him to consider options for caregiver resources, expanding assistive devices to increase use of wheelchair rather than walker.  Spouse reports that he continues able to get patient in/ out of vehicle to attend provider appointments, but adds, "it's not easy."  -- again confirms has reviewed Advanced Directives independently-- states with patient's recent worsening periods of "chemo- cloud brain," he has been unable to discuss with her directly; states that he will do "if the chemo-cloud ever lifts."  Support provided; validated that spouses concerns with moving forward with AD's are appropriate.  Verbalizes plans to discuss her wishes around AD planning with patient when/if this improves   -- provider appointments reviewed; attended recent PET scan, was told "no more cancer detected."  Has had 2 recent oncology appointments, MRI brain has been ordered  For "tomorrow" around patient's current (newly reported)  worsening symptoms around chemo side-effects.  Has another oncology appointment this afternoon with "colon  cancer" specialist.  Next appointment with oncology November 19, 2017.  Spouse to continue providing transportation.  Patient's caregiver denies further issues, concerns, or problems today.I confirmed that patient/ spousehavemy direct phone number, the main [facility] CM office phone number, and the [facility] CM 24-hour nurse advice phone number should issues arise prior to next scheduled [place] outreachby phone in 2 weeks.  Spouse currently declines [facility] RN CM home visit, states "will see" after upcoming provider appointments. Encouraged patient/ spouseto contact me directly if needs, questions, issues, or concerns arise prior to next scheduled outreach and heagreed to do so.  Plan:  Patient will take medications as prescribed and will attend all scheduled provider appointments  Patient will actively participate in home health services as ordered post-hospital discharge as long as they are active in patient's care  Patient willcontinueusingassistive devices for ambulation  Patient/ spouse will promptly notify patient's care providers for any new concerns, issues, or problems that arise  [facility] CM outreach to continue with scheduledtelephone callin 2 weeks  [facility] CM Care Plan Problem Two     Most Recent Value  Care Plan Problem Two  High Risk for falls, as evidenced by recent fall requiring hospitalization and SNF placement  Role Documenting the Problem Two  Care Management Coordinator  Care Plan for Problem Two  Active  Interventions for Problem Two Long Term Goal   Discussed with patient's spouse patient's current clinical status and altered affect/ emotional/ cognitive functioning post-chemotherapy,  discussed patient's spouse ability to continue managing patient's mobility by himself,  reinforced previously provided education around fall risks/ prevention,  discussed options for caregiver resources available for caregiver/ patient assistance,  confirmed that  patient has incurred no new/ recent falls  [facility] Long Term Goal  Over the next 60 days, patient will use assistive devices as prescribed post- SNF discharge, as evidenced by patient/ caregiver reporting during [facility] RN CM outreach  [facility] Long Term Goal Start Date  09/28/17  [facility] CM Short Term Goal #1   Over the next 13 days, patient will not experience a fall, as evidenced by reporting of patient's spouse during [place] outreach  [facility] CM Short Term Goal #1 Start Date  11/11/17  Interventions for Short Term Goal #2   Discussed with patient's spouse his current practices for assisting patient in ambulation,  encouraged spouse to continue keeping patient's care providers updated on her status, and to request further home health services if he believes this will benefit patient in maintaining strength/ mobility    [facility] CM Care Plan Problem Three     Most Recent Value  Care Plan Problem Three  Need for advanced directive planning, as evidenced by patient/ spouse reporting of same  Role Documenting the Problem Three  Care Management Coordinator  Care Plan for Problem Three  Active  [facility] Long Term Goal   Over the next 38 days, patient and caregiver will review Advanced Directive planning packet and printed educational material, as evidenced by patient and caregiiver reporting during [place] outreach  [facility] Long Term Goal Start Date  10/20/17  Interventions for Problem Three Long Term Goal  Discussed with patient's spouse his and patient's progress in reviewing AD documents previously provided,  provided emotional support to patient's spouse around patient'sd current affect/ emotional decline post- chemo, and encouraged him to re-review in the future, as indicated aorund patient's affect, emotional  stability, and cognitive state     Oneta Rack, RN, BSN, [employer] Coordinator [facility] Care Management  [phone]

## 2017-11-12 ENCOUNTER — Encounter: Payer: Self-pay | Admitting: Surgery

## 2017-11-12 ENCOUNTER — Ambulatory Visit
Admission: RE | Admit: 2017-11-12 | Discharge: 2017-11-12 | Disposition: A | Discharge status: Home / Self Care | Payer: PPO | Source: Ambulatory Visit | Attending: Oncology | Admitting: Oncology

## 2017-11-12 DIAGNOSIS — I10 Essential (primary) hypertension: Secondary | ICD-10-CM | POA: Diagnosis not present

## 2017-11-12 DIAGNOSIS — E785 Hyperlipidemia, unspecified: Secondary | ICD-10-CM | POA: Diagnosis not present

## 2017-11-12 DIAGNOSIS — R079 Chest pain, unspecified: Secondary | ICD-10-CM | POA: Diagnosis not present

## 2017-11-12 DIAGNOSIS — E7800 Pure hypercholesterolemia, unspecified: Secondary | ICD-10-CM | POA: Diagnosis not present

## 2017-11-12 DIAGNOSIS — I739 Peripheral vascular disease, unspecified: Secondary | ICD-10-CM | POA: Insufficient documentation

## 2017-11-12 DIAGNOSIS — C189 Malignant neoplasm of colon, unspecified: Secondary | ICD-10-CM

## 2017-11-12 DIAGNOSIS — Z9221 Personal history of antineoplastic chemotherapy: Secondary | ICD-10-CM | POA: Diagnosis not present

## 2017-11-12 DIAGNOSIS — Z66 Do not resuscitate: Secondary | ICD-10-CM | POA: Diagnosis not present

## 2017-11-12 DIAGNOSIS — Y95 Nosocomial condition: Secondary | ICD-10-CM | POA: Diagnosis present

## 2017-11-12 DIAGNOSIS — A419 Sepsis, unspecified organism: Secondary | ICD-10-CM | POA: Diagnosis not present

## 2017-11-12 DIAGNOSIS — R0602 Shortness of breath: Secondary | ICD-10-CM | POA: Diagnosis not present

## 2017-11-12 DIAGNOSIS — Z887 Allergy status to serum and vaccine status: Secondary | ICD-10-CM | POA: Diagnosis not present

## 2017-11-12 DIAGNOSIS — R410 Disorientation, unspecified: Secondary | ICD-10-CM | POA: Diagnosis not present

## 2017-11-12 DIAGNOSIS — B370 Candidal stomatitis: Secondary | ICD-10-CM | POA: Diagnosis not present

## 2017-11-12 DIAGNOSIS — E860 Dehydration: Secondary | ICD-10-CM | POA: Diagnosis not present

## 2017-11-12 DIAGNOSIS — J440 Chronic obstructive pulmonary disease with acute lower respiratory infection: Secondary | ICD-10-CM | POA: Diagnosis not present

## 2017-11-12 DIAGNOSIS — J181 Lobar pneumonia, unspecified organism: Secondary | ICD-10-CM | POA: Diagnosis not present

## 2017-11-12 DIAGNOSIS — R413 Other amnesia: Secondary | ICD-10-CM | POA: Diagnosis not present

## 2017-11-12 DIAGNOSIS — G319 Degenerative disease of nervous system, unspecified: Secondary | ICD-10-CM

## 2017-11-12 DIAGNOSIS — C760 Malignant neoplasm of head, face and neck: Secondary | ICD-10-CM

## 2017-11-12 DIAGNOSIS — Z85038 Personal history of other malignant neoplasm of large intestine: Secondary | ICD-10-CM | POA: Diagnosis not present

## 2017-11-12 DIAGNOSIS — Z923 Personal history of irradiation: Secondary | ICD-10-CM | POA: Diagnosis not present

## 2017-11-12 DIAGNOSIS — R531 Weakness: Secondary | ICD-10-CM | POA: Diagnosis not present

## 2017-11-12 DIAGNOSIS — J189 Pneumonia, unspecified organism: Secondary | ICD-10-CM | POA: Diagnosis not present

## 2017-11-12 DIAGNOSIS — J9601 Acute respiratory failure with hypoxia: Secondary | ICD-10-CM | POA: Diagnosis not present

## 2017-11-12 DIAGNOSIS — F17210 Nicotine dependence, cigarettes, uncomplicated: Secondary | ICD-10-CM | POA: Diagnosis not present

## 2017-11-12 MED ORDER — GADOBENATE DIMEGLUMINE 529 MG/ML IV SOLN
10.0000 mL | Freq: Once | INTRAVENOUS | Status: AC | PRN
  Administered 2017-11-12: 10 mL via INTRAVENOUS

## 2017-11-12 NOTE — Progress Notes (Signed)
This is a 77 year old female with metastatic head and neck cancer and a recently discovered right colon cancer. Unfortunately her recent medical course have been complicated by a fall requiring fixation.  This impaired her ability to walk and now she is recovering from it.  Most recently she is becoming encephalopathic and plans are to perform an MRI. He is eating and having bowel movements and there is no evidence of obstruction. On physical exam her abdomen is soft nondistended no peritonitis.  I had a lengthy conversation with the husband who does not want to pursue any surgical intervention until her encephalopathy is better. We will wait for the MRI results and I will see her back in 3 weeks and hopefully at that time her encephalopathy will be soft so we can potentially perform a right hemicolectomy. I Spent more than 25 minutes in this encounter with greater than 50% spent in coordination and counseling of her current

## 2017-11-15 ENCOUNTER — Inpatient Hospital Stay
Admission: EM | Admit: 2017-11-15 | Discharge: 2017-11-17 | DRG: 871 | Disposition: A | Discharge status: Home / Self Care | Payer: PPO | Attending: Internal Medicine | Admitting: Internal Medicine

## 2017-11-15 ENCOUNTER — Emergency Department: Payer: PPO

## 2017-11-15 ENCOUNTER — Encounter: Payer: Self-pay | Admitting: *Deleted

## 2017-11-15 ENCOUNTER — Other Ambulatory Visit: Payer: Self-pay

## 2017-11-15 DIAGNOSIS — A419 Sepsis, unspecified organism: Secondary | ICD-10-CM

## 2017-11-15 DIAGNOSIS — Z66 Do not resuscitate: Secondary | ICD-10-CM | POA: Diagnosis present

## 2017-11-15 DIAGNOSIS — C760 Malignant neoplasm of head, face and neck: Secondary | ICD-10-CM | POA: Diagnosis present

## 2017-11-15 DIAGNOSIS — J440 Chronic obstructive pulmonary disease with acute lower respiratory infection: Secondary | ICD-10-CM | POA: Diagnosis present

## 2017-11-15 DIAGNOSIS — Z9221 Personal history of antineoplastic chemotherapy: Secondary | ICD-10-CM | POA: Diagnosis not present

## 2017-11-15 DIAGNOSIS — J9600 Acute respiratory failure, unspecified whether with hypoxia or hypercapnia: Secondary | ICD-10-CM | POA: Diagnosis present

## 2017-11-15 DIAGNOSIS — E860 Dehydration: Secondary | ICD-10-CM

## 2017-11-15 DIAGNOSIS — F17210 Nicotine dependence, cigarettes, uncomplicated: Secondary | ICD-10-CM | POA: Diagnosis present

## 2017-11-15 DIAGNOSIS — B370 Candidal stomatitis: Secondary | ICD-10-CM | POA: Diagnosis present

## 2017-11-15 DIAGNOSIS — Z923 Personal history of irradiation: Secondary | ICD-10-CM

## 2017-11-15 DIAGNOSIS — I10 Essential (primary) hypertension: Secondary | ICD-10-CM | POA: Diagnosis present

## 2017-11-15 DIAGNOSIS — Z887 Allergy status to serum and vaccine status: Secondary | ICD-10-CM

## 2017-11-15 DIAGNOSIS — E7800 Pure hypercholesterolemia, unspecified: Secondary | ICD-10-CM | POA: Diagnosis present

## 2017-11-15 DIAGNOSIS — C189 Malignant neoplasm of colon, unspecified: Secondary | ICD-10-CM | POA: Diagnosis not present

## 2017-11-15 DIAGNOSIS — J9601 Acute respiratory failure with hypoxia: Secondary | ICD-10-CM

## 2017-11-15 DIAGNOSIS — Y95 Nosocomial condition: Secondary | ICD-10-CM | POA: Diagnosis present

## 2017-11-15 DIAGNOSIS — J181 Lobar pneumonia, unspecified organism: Secondary | ICD-10-CM | POA: Diagnosis present

## 2017-11-15 DIAGNOSIS — E785 Hyperlipidemia, unspecified: Secondary | ICD-10-CM | POA: Diagnosis present

## 2017-11-15 DIAGNOSIS — J189 Pneumonia, unspecified organism: Secondary | ICD-10-CM | POA: Diagnosis not present

## 2017-11-15 DIAGNOSIS — Z85038 Personal history of other malignant neoplasm of large intestine: Secondary | ICD-10-CM | POA: Diagnosis not present

## 2017-11-15 DIAGNOSIS — R531 Weakness: Secondary | ICD-10-CM | POA: Diagnosis present

## 2017-11-15 HISTORY — DX: Pneumonia, unspecified organism: J18.9

## 2017-11-15 HISTORY — DX: Acute respiratory failure with hypoxia: J96.01

## 2017-11-15 HISTORY — DX: Acute respiratory failure, unspecified whether with hypoxia or hypercapnia: J96.00

## 2017-11-15 LAB — CBC WITH DIFFERENTIAL/PLATELET
BASOS PCT: 0 %
Basophils Absolute: 0.1 10*3/uL (ref 0–0.1)
EOS ABS: 0 10*3/uL (ref 0–0.7)
EOS PCT: 0 %
HCT: 49.1 % — ABNORMAL HIGH (ref 35.0–47.0)
HEMOGLOBIN: 16 g/dL (ref 12.0–16.0)
LYMPHS ABS: 0.9 10*3/uL — AB (ref 1.0–3.6)
Lymphocytes Relative: 4 %
MCH: 31.4 pg (ref 26.0–34.0)
MCHC: 32.6 g/dL (ref 32.0–36.0)
MCV: 96.4 fL (ref 80.0–100.0)
MONO ABS: 0.6 10*3/uL (ref 0.2–0.9)
MONOS PCT: 3 %
Neutro Abs: 20.7 10*3/uL — ABNORMAL HIGH (ref 1.4–6.5)
Neutrophils Relative %: 93 %
Platelets: 226 10*3/uL (ref 150–440)
RBC: 5.1 MIL/uL (ref 3.80–5.20)
RDW: 14.4 % (ref 11.5–14.5)
WBC: 22.4 10*3/uL — ABNORMAL HIGH (ref 3.6–11.0)

## 2017-11-15 LAB — COMPREHENSIVE METABOLIC PANEL
ALK PHOS: 119 U/L (ref 38–126)
ALT: 23 U/L (ref 14–54)
AST: 33 U/L (ref 15–41)
Albumin: 3.6 g/dL (ref 3.5–5.0)
Anion gap: 11 (ref 5–15)
BUN: 23 mg/dL — AB (ref 6–20)
CALCIUM: 9.3 mg/dL (ref 8.9–10.3)
CHLORIDE: 103 mmol/L (ref 101–111)
CO2: 25 mmol/L (ref 22–32)
CREATININE: 0.74 mg/dL (ref 0.44–1.00)
GFR calc non Af Amer: >60 mL/min (ref >60)
GLUCOSE: 107 mg/dL — AB (ref 65–99)
Potassium: 4.2 mmol/L (ref 3.5–5.1)
SODIUM: 139 mmol/L (ref 135–145)
Total Bilirubin: 1 mg/dL (ref 0.3–1.2)
Total Protein: 7.5 g/dL (ref 6.5–8.1)

## 2017-11-15 LAB — INFLUENZA PANEL BY PCR (TYPE A & B)
INFLBPCR: NEGATIVE
Influenza A By PCR: NEGATIVE

## 2017-11-15 LAB — LACTIC ACID, PLASMA: Lactic Acid, Venous: 2 mmol/L (ref 0.5–1.9)

## 2017-11-15 LAB — BRAIN NATRIURETIC PEPTIDE: B NATRIURETIC PEPTIDE 5: 460 pg/mL — AB (ref 0.0–100.0)

## 2017-11-15 LAB — TROPONIN I: TROPONIN I: 0.04 ng/mL — AB (ref <0.03)

## 2017-11-15 MED ORDER — GUAIFENESIN ER 600 MG PO TB12
1200.0000 mg | ORAL_TABLET | Freq: Two times a day (BID) | ORAL | Status: DC
  Administered 2017-11-15 – 2017-11-17 (×4): 1200 mg via ORAL
  Filled 2017-11-15 (×5): qty 2

## 2017-11-15 MED ORDER — PIPERACILLIN-TAZOBACTAM 3.375 G IVPB
3.3750 g | Freq: Three times a day (TID) | INTRAVENOUS | Status: DC
  Administered 2017-11-15 – 2017-11-17 (×5): 3.375 g via INTRAVENOUS
  Filled 2017-11-15 (×5): qty 50

## 2017-11-15 MED ORDER — VANCOMYCIN HCL IN DEXTROSE 1-5 GM/200ML-% IV SOLN: 1000.0000 mg | Freq: Once | INTRAVENOUS | Status: DC

## 2017-11-15 MED ORDER — ACETAMINOPHEN 650 MG RE SUPP: 650.0000 mg | Freq: Four times a day (QID) | RECTAL | Status: DC | PRN

## 2017-11-15 MED ORDER — PANTOPRAZOLE SODIUM 40 MG IV SOLR
40.0000 mg | Freq: Two times a day (BID) | INTRAVENOUS | Status: DC
  Administered 2017-11-15 – 2017-11-16 (×3): 40 mg via INTRAVENOUS
  Filled 2017-11-15 (×3): qty 40

## 2017-11-15 MED ORDER — IPRATROPIUM-ALBUTEROL 0.5-2.5 (3) MG/3ML IN SOLN
3.0000 mL | Freq: Four times a day (QID) | RESPIRATORY_TRACT | Status: DC
  Administered 2017-11-16 – 2017-11-17 (×5): 3 mL via RESPIRATORY_TRACT
  Filled 2017-11-15 (×6): qty 3

## 2017-11-15 MED ORDER — SODIUM CHLORIDE 0.9 % IV SOLN
INTRAVENOUS | Status: DC
  Administered 2017-11-15 – 2017-11-17 (×3): via INTRAVENOUS

## 2017-11-15 MED ORDER — BISACODYL 10 MG RE SUPP
10.0000 mg | Freq: Every day | RECTAL | Status: DC | PRN
  Filled 2017-11-15: qty 1

## 2017-11-15 MED ORDER — CLOTRIMAZOLE 10 MG MT TROC
10.0000 mg | Freq: Every day | OROMUCOSAL | Status: DC
  Administered 2017-11-15 – 2017-11-17 (×8): 10 mg via ORAL
  Filled 2017-11-15 (×11): qty 1

## 2017-11-15 MED ORDER — HEPARIN SODIUM (PORCINE) 5000 UNIT/ML IJ SOLN
5000.0000 [IU] | Freq: Three times a day (TID) | INTRAMUSCULAR | Status: DC
  Administered 2017-11-16 – 2017-11-17 (×4): 5000 [IU] via SUBCUTANEOUS
  Filled 2017-11-15 (×4): qty 1

## 2017-11-15 MED ORDER — SODIUM CHLORIDE 0.9 % IV BOLUS
1000.0000 mL | Freq: Once | INTRAVENOUS | Status: AC
  Administered 2017-11-15: 1000 mL via INTRAVENOUS

## 2017-11-15 MED ORDER — AMLODIPINE BESYLATE 5 MG PO TABS
5.0000 mg | ORAL_TABLET | Freq: Every day | ORAL | Status: DC
  Administered 2017-11-16 – 2017-11-17 (×2): 5 mg via ORAL
  Filled 2017-11-15 (×2): qty 1

## 2017-11-15 MED ORDER — CEFEPIME HCL 2 G IJ SOLR
2.0000 g | Freq: Once | INTRAMUSCULAR | Status: AC
  Administered 2017-11-15: 2 g via INTRAVENOUS

## 2017-11-15 MED ORDER — FLUCONAZOLE IN SODIUM CHLORIDE 200-0.9 MG/100ML-% IV SOLN
200.0000 mg | INTRAVENOUS | Status: DC
  Administered 2017-11-15: 200 mg via INTRAVENOUS
  Filled 2017-11-15 (×2): qty 100

## 2017-11-15 MED ORDER — ACETAMINOPHEN 325 MG PO TABS: 650.0000 mg | ORAL_TABLET | Freq: Four times a day (QID) | ORAL | Status: DC | PRN

## 2017-11-15 MED ORDER — DOCUSATE SODIUM 100 MG PO CAPS
100.0000 mg | ORAL_CAPSULE | Freq: Two times a day (BID) | ORAL | Status: DC
  Administered 2017-11-16 – 2017-11-17 (×3): 100 mg via ORAL
  Filled 2017-11-15 (×3): qty 1

## 2017-11-15 MED ORDER — ONDANSETRON HCL 4 MG PO TABS: 4.0000 mg | ORAL_TABLET | Freq: Four times a day (QID) | ORAL | Status: DC | PRN

## 2017-11-15 MED ORDER — PRAVASTATIN SODIUM 20 MG PO TABS
10.0000 mg | ORAL_TABLET | Freq: Every day | ORAL | Status: DC
  Administered 2017-11-16: 10 mg via ORAL
  Filled 2017-11-15: qty 1

## 2017-11-15 MED ORDER — SODIUM CHLORIDE 0.9 % IV SOLN
INTRAVENOUS | Status: AC
  Filled 2017-11-15: qty 2

## 2017-11-15 MED ORDER — ONDANSETRON HCL 4 MG/2ML IJ SOLN: 4.0000 mg | Freq: Four times a day (QID) | INTRAMUSCULAR | Status: DC | PRN

## 2017-11-15 NOTE — Progress Notes (Signed)
Pharmacy Antibiotic Note  Dana Perez is a 77 y.o. female admitted on 11/15/2017 with pneumonia.  Pharmacy has been consulted for zosyn dosing.  Plan: Zosyn 3.375g IV q8h (4 hour infusion).  Height: 5\' 4"  (162.6 cm) Weight: 119 lb (54 kg) IBW/kg (Calculated) : 54.7  Temp (24hrs), Avg:98.3 F (36.8 C), Min:98.3 F (36.8 C), Max:98.3 F (36.8 C)  Recent Labs  Lab 11/15/17 1948  WBC 22.4*  CREATININE 0.74  LATICACIDVEN 2.0*    Estimated Creatinine Clearance: 52.6 mL/min (by C-G formula based on SCr of 0.74 mg/dL).    Allergies  Allergen Reactions  . Other Other (See Comments) and Rash    TDAP  . Tetanus Antitoxin Rash    Antimicrobials this admission: Anti-infectives (From admission, onward)   Start     Dose/Rate Route Frequency Ordered Stop   11/15/17 2200  piperacillin-tazobactam (ZOSYN) IVPB 3.375 g     3.375 g 12.5 mL/hr over 240 Minutes Intravenous Every 8 hours 11/15/17 2136     11/15/17 2130  fluconazole (DIFLUCAN) IVPB 200 mg     200 mg 100 mL/hr over 60 Minutes Intravenous Every 24 hours 11/15/17 2115     11/15/17 2045  ceFEPIme (MAXIPIME) 2 g in sodium chloride 0.9 % 100 mL IVPB     2 g 200 mL/hr over 30 Minutes Intravenous  Once 11/15/17 2035 11/15/17 2135   11/15/17 2045  vancomycin (VANCOCIN) IVPB 1000 mg/200 mL premix  Status:  Discontinued     1,000 mg 200 mL/hr over 60 Minutes Intravenous  Once 11/15/17 2035 11/15/17 2115       Microbiology results: No results found for this or any previous visit (from the past 240 hour(s)).   Thank you for allowing pharmacy to be a part of this patient's care.  Donna Christen Alonte Wulff 11/15/2017 9:36 PM

## 2017-11-15 NOTE — ED Notes (Signed)
Called code sepsis to [facility] [id number]

## 2017-11-15 NOTE — ED Triage Notes (Signed)
Pt brought in  Via wheelchair.  Pt has colon cancer. Chemo and radiation pt .  Pt has brace on left ankle for broken ankle.  Pt has low oxygen sats .

## 2017-11-15 NOTE — ED Notes (Signed)
Patient to ED via her husband from home. Patient is unsure why she is here. When asked her personal information she was unsure of her birthday and was unsure of what day it was. Husband came in from parking the car and provided additional information.

## 2017-11-15 NOTE — ED Provider Notes (Signed)
[facility] Emergency Department Provider Note ____________________________________________   First MD Initiated Contact with Patient 11/15/17 1928     (approximate)  I have reviewed the triage vital signs and the nursing notes.   HISTORY  Chief Complaint Weakness    HPI Dana Perez is a 77 y.o. female with history of metastatic head and neck cancer, as well as colon cancer, who presents with multiple complaints over the last several days, but primarily shortness of breath and chest pain, gradual onset, worsening course, and associated with increased confusion and increased generalized weakness.  Patient has had decreased appetite per her husband.  She had recent MRI which showed no evidence of metastatic disease to the brain.   Past Medical History:  Diagnosis Date  . Adenocarcinoma of colon ([place])   . Benign neoplasm of ascending colon   . Essential hypertension 06/13/2014  . Goals of care, counseling/discussion 07/14/2017  . HLD (hyperlipidemia) 06/13/2014  . Hypercholesteremia   . Hyperlipidemia   . Multiple thyroid nodules 01/22/2012  . Osteoporosis   . Polycythemia, secondary 12/26/2014  . Pure hypercholesterolemia 03/15/2015  . Thyroid nodule     Patient Active Problem List   Diagnosis Date Noted  . Malnutrition of moderate degree 08/10/2017  . Closed left ankle fracture 08/07/2017  . Goals of care, counseling/discussion 07/14/2017  . Adenocarcinoma of colon ([place])   . Benign neoplasm of ascending colon   . Squamous cell carcinoma of head and neck ([place]) 07/01/2017  . Erythrocytosis 07/17/2015  . Pure hypercholesterolemia 03/15/2015  . Polycythemia, secondary 12/26/2014  . HLD (hyperlipidemia) 06/13/2014  . Essential (primary) hypertension 06/13/2014  . Hyperlipidemia, unspecified 06/13/2014  . Essential hypertension 06/13/2014  . Multiple thyroid nodules 01/22/2012    Past Surgical History:  Procedure Laterality Date  .  ABDOMINAL HYSTERECTOMY    . COLONOSCOPY WITH PROPOFOL N/A 07/02/2017   Procedure: COLONOSCOPY WITH PROPOFOL;  Surgeon: Lucilla Lame, MD;  Location: [facility];  Service: Endoscopy;  Laterality: N/A;  . ORIF ANKLE FRACTURE Left 08/08/2017   Procedure: OPEN REDUCTION INTERNAL FIXATION (ORIF) ANKLE FRACTURE;  Surgeon: Dereck Leep, MD;  Location: [facility];  Service: Orthopedics;  Laterality: Left;  . POLYPECTOMY N/A 07/02/2017   Procedure: POLYPECTOMY;  Surgeon: Lucilla Lame, MD;  Location: [place];  Service: Endoscopy;  Laterality: N/A;  . WRIST FRACTURE SURGERY  08/2009   badly broken    Prior to Admission medications   Medication Sig Start Date End Date Taking? Authorizing Provider  acetaminophen (TYLENOL) 500 MG tablet Take 500 mg by mouth every 6 (six) hours as needed.    [provider]  amLODipine (NORVASC) 5 MG tablet Take by mouth. 10/02/17 10/02/18  [provider]  calcium carbonate (OSCAL) 1500 (600 Ca) MG TABS tablet Take 600 mg of elemental calcium by mouth daily with breakfast.    [provider]  pravastatin (PRAVACHOL) 10 MG tablet Take 10 mg by mouth daily.    [provider]  prochlorperazine (COMPAZINE) 10 MG tablet TAKE 1 TABLET (10 MG TOTAL) EVERY 6 (SIX) HOURS AS NEEDED BY MOUTH (NAUSEA OR VOMITING). 07/01/17   [provider]    Allergies Other and Tetanus antitoxin  Family History  Problem Relation Age of Onset  . Heart disease Mother   . Diabetes Father     Social History Social History   Tobacco Use  . Smoking status: Current Every Day Smoker    Packs/day: 0.25    Types: Cigarettes  .  Smokeless tobacco: Never Used  . Tobacco comment: occasional smoker  Substance Use Topics  . Alcohol use: No  . Drug use: No    Review of Systems  Constitutional: Negative for fever.   Eyes: No redness. ENT: Positive for sore throat. Cardiovascular: Positive for chest pain. Respiratory: Positive for  shortness of breath. Gastrointestinal: No vomiting.  Genitourinary: Negative for dysuria.  Musculoskeletal: Negative for back pain. Skin: Negative for rash. Neurological: Negative for headache.   ____________________________________________   PHYSICAL EXAM:  VITAL SIGNS: ED Triage Vitals  Enc Vitals Group     BP 11/15/17 1921 127/64     Pulse Rate 11/15/17 1921 (!) 120     Resp 11/15/17 1921 (!) 2     Temp 11/15/17 1921 98.3 F (36.8 C)     Temp Source 11/15/17 1921 Oral     SpO2 11/15/17 1921 (!) 87 %     Weight 11/15/17 1922 119 lb (54 kg)     Height 11/15/17 1922 5\' 4"  (1.626 m)     Head Circumference --      Peak Flow --      Pain Score 11/15/17 1922 5     Pain Loc --      Pain Edu? --      Excl. in [place]? --     Constitutional: Alert, slightly confused.  Uncomfortable appearing. Eyes: Conjunctivae are normal.  Head: Atraumatic. Nose: No congestion/rhinnorhea. Mouth/Throat: Mucous membranes are very dry.   Neck: Normal range of motion.  Cardiovascular: Tachycardic, irregular rhythm. Grossly normal heart sounds.  Good peripheral circulation. Respiratory: Normal respiratory effort.  No retractions.  Decreased and coarse breath sounds bilaterally with faint rales. Gastrointestinal: Soft and nontender. No distention.  Genitourinary: No flank tenderness. Musculoskeletal: No lower extremity edema.  Extremities warm and well perfused.  Neurologic: Motor intact in all extremities. Skin:  Skin is warm and dry. No rash noted. Psychiatric: Slightly anxious appearing but cooperative.  ____________________________________________   LABS (all labs ordered are listed, but only abnormal results are displayed)  Labs Reviewed  COMPREHENSIVE METABOLIC PANEL - Abnormal; Notable for the following components:      Result Value   Glucose, Bld 107 (*)    BUN 23 (*)    All other components within normal limits  CBC WITH DIFFERENTIAL/PLATELET - Abnormal; Notable for the following  components:   WBC 22.4 (*)    HCT 49.1 (*)    Neutro Abs 20.7 (*)    Lymphs Abs 0.9 (*)    All other components within normal limits  TROPONIN I - Abnormal; Notable for the following components:   Troponin I 0.04 (*)    All other components within normal limits  LACTIC ACID, PLASMA - Abnormal; Notable for the following components:   Lactic Acid, Venous 2.0 (*)    All other components within normal limits  CULTURE, BLOOD (ROUTINE X 2)  CULTURE, BLOOD (ROUTINE X 2)  INFLUENZA PANEL BY PCR (TYPE A & B)  BRAIN NATRIURETIC PEPTIDE  URINALYSIS, COMPLETE (UACMP) WITH MICROSCOPIC  LACTIC ACID, PLASMA   ____________________________________________  EKG  ED ECG REPORT I, Arta Silence, the attending physician, personally viewed and interpreted this ECG.  Date: 11/15/2017 EKG Time: 1939 Rate: 111 Rhythm: Sinus rhythm with PACs QRS Axis: normal Intervals: normal ST/T Wave abnormalities: normal Narrative Interpretation: no evidence of acute ischemia  ____________________________________________  RADIOLOGY  CXR: Hazy left lower lobe opacity, infiltrate versus atelectasis  ____________________________________________   PROCEDURES  Procedure(s) performed: No  Procedures  Critical Care performed: Yes  CRITICAL CARE Performed by: Arta Silence   Total critical care time: 35 minutes  Critical care time was exclusive of separately billable procedures and treating other patients.  Critical care was necessary to treat or prevent imminent or life-threatening deterioration.  Critical care was time spent personally by me on the following activities: development of treatment plan with patient and/or surrogate as well as nursing, discussions with consultants, evaluation of patient's response to treatment, examination of patient, obtaining history from patient or surrogate, ordering and performing treatments and interventions, ordering and review of laboratory studies,  ordering and review of radiographic studies, pulse oximetry and re-evaluation of patient's condition. ____________________________________________   INITIAL IMPRESSION / ASSESSMENT AND PLAN / ED COURSE  Pertinent labs & imaging results that were available during my care of the patient were reviewed by me and considered in my medical decision making (see chart for details).  77 year old female with history of metastatic head neck cancer, colon cancer, and other PMH as noted above presents with increased chest pain and difficulty breathing, episodes of confusion, decreased appetite, and generalized weakness over the last several days.  I reviewed the past medical records in Epic and confirmed her cancer history.  Patient had an MRI this week which showed no evidence of brain metastases.  The patient was most recently admitted to the hospital in December 2018 for ankle fracture.  On exam, the patient is tachycardic and hypoxic but the other vital signs are normal.  She has very dry mucous membranes and appears dehydrated, and the remainder the exam is as described above.  The differential is broad but includes pneumonia, viral bronchitis, influenza, other infectious etiology/sepsis, dehydration or other metabolic cause, or less likely cardiac etiology.  I have a lower suspicion for PE given the somewhat gradual onset of the symptoms, the abnormal breath sounds, and the dehydration, however since this patient has active cancer, recent fracture and hospitalization, and both tachycardia and hypoxia, she is at significant risk for PE.  We will obtain labs, sepsis work-up, and chest x-ray.  If chest x-ray is clear, we will consider CT chest.  Anticipate likely admission.    ----------------------------------------- 8:45 PM on 11/15/2017 -----------------------------------------  Workup is consistent with infection/sepsis, with elevated WBC and lactate.  Influenza is negative.  Chest x-ray shows hazy  lower lobe left opacity; given the clinical situation this favors infiltrate.  Patient made a code sepsis, and given broad-spectrum antibiotics for HAP given relatively recent admission and likely immunocompromise.  Vital signs improving after fluids.  At this time, I do not suspect PE given the findings on the x-ray, workup consistent with infection, and improvement in her vital signs.  Patient also has been hypoxic previously per husband.  I signed the patient out to the hospitalist Dr. Doy Hutching for admission.  ____________________________________________   FINAL CLINICAL IMPRESSION(S) / ED DIAGNOSES  Final diagnoses:  Healthcare-associated pneumonia  Sepsis, due to unspecified organism ([place])  Dehydration      NEW MEDICATIONS STARTED DURING THIS VISIT:  New Prescriptions   No medications on file     Note:  This document was prepared using Dragon voice recognition software and may include unintentional dictation errors.    Arta Silence, MD 11/15/17 2047

## 2017-11-15 NOTE — H&P (Signed)
History and Physical    GLENDER AUGUSTA YPP:[id number] DOB: 1941/06/06 DOA: 11/15/2017  Referring physician: Dr. Cherylann Banas PCP: Glendon Axe, MD  Specialists: Dr. Grayland Ormond  Chief Complaint: SOB with fever and cough  HPI: Dana Perez is a 77 y.o. female has a past medical history significant for ENT cancer, HTN, and COPD now with 1 week hx of progressive SOB with fever and cough. In ER, pt noted to be hypoxic with tachycardia and elevated WBC. CXR shows pneumonia. She is now admitted. Low grade fever. Denies CP. No N/V/D. Cough is non-productive.  Review of Systems: The patient denies weight loss,, vision loss, decreased hearing, hoarseness, chest pain, syncope, peripheral edema, balance deficits, hemoptysis, abdominal pain, melena, hematochezia, severe indigestion/heartburn, hematuria, incontinence, genital sores, muscle weakness, suspicious skin lesions, transient blindness, difficulty walking, depression, unusual weight change, abnormal bleeding, enlarged lymph nodes, angioedema, and breast masses.   Past Medical History:  Diagnosis Date  . Adenocarcinoma of colon ([place])   . Benign neoplasm of ascending colon   . Essential hypertension 06/13/2014  . Goals of care, counseling/discussion 07/14/2017  . HLD (hyperlipidemia) 06/13/2014  . Hypercholesteremia   . Hyperlipidemia   . Multiple thyroid nodules 01/22/2012  . Osteoporosis   . Polycythemia, secondary 12/26/2014  . Pure hypercholesterolemia 03/15/2015  . Thyroid nodule    Past Surgical History:  Procedure Laterality Date  . ABDOMINAL HYSTERECTOMY    . COLONOSCOPY WITH PROPOFOL N/A 07/02/2017   Procedure: COLONOSCOPY WITH PROPOFOL;  Surgeon: Lucilla Lame, MD;  Location: [place];  Service: Endoscopy;  Laterality: N/A;  . ORIF ANKLE FRACTURE Left 08/08/2017   Procedure: OPEN REDUCTION INTERNAL FIXATION (ORIF) ANKLE FRACTURE;  Surgeon: Dereck Leep, MD;  Location: [facility];  Service: Orthopedics;  Laterality:  Left;  . POLYPECTOMY N/A 07/02/2017   Procedure: POLYPECTOMY;  Surgeon: Lucilla Lame, MD;  Location: [facility];  Service: Endoscopy;  Laterality: N/A;  . WRIST FRACTURE SURGERY  08/2009   badly broken   Social History:  reports that she has been smoking cigarettes.  She has been smoking about 0.25 packs per day. She has never used smokeless tobacco. She reports that she does not drink alcohol or use drugs.  Allergies  Allergen Reactions  . Other Other (See Comments) and Rash    TDAP  . Tetanus Antitoxin Rash    Family History  Problem Relation Age of Onset  . Heart disease Mother   . Diabetes Father     Prior to Admission medications   Medication Sig Start Date End Date Taking? Authorizing Provider  acetaminophen (TYLENOL) 500 MG tablet Take 500 mg by mouth every 6 (six) hours as needed.    [provider]  amLODipine (NORVASC) 5 MG tablet Take by mouth. 10/02/17 10/02/18  [provider]  calcium carbonate (OSCAL) 1500 (600 Ca) MG TABS tablet Take 600 mg of elemental calcium by mouth daily with breakfast.    [provider]  pravastatin (PRAVACHOL) 10 MG tablet Take 10 mg by mouth daily.    [provider]  prochlorperazine (COMPAZINE) 10 MG tablet TAKE 1 TABLET (10 MG TOTAL) EVERY 6 (SIX) HOURS AS NEEDED BY MOUTH (NAUSEA OR VOMITING). 07/01/17   [provider]   Physical Exam: Vitals:   11/15/17 1921 11/15/17 1922 11/15/17 2000 11/15/17 2030  BP: 127/64  113/85 134/78  Pulse: (!) 120  (!) 106   Resp: (!) 2  19 20   Temp: 98.3 F (36.8 C)     TempSrc:  Oral     SpO2: (!) 87%  99%   Weight:  54 kg (119 lb)    Height:  5\' 4"  (1.626 m)       General:  No apparent distress, WDWN, [place]/AT  Eyes: PERRL, EOMI, no scleral icterus, conjunctiva clear  ENT: dry oropharynx with erythema and thrush, TM's benign, dentition fair  Neck: supple, cervical lymphadenopathy noted. No bruits or thyromegaly  Cardiovascular: rapid rate  with regular rhythm without MRG; 2+ peripheral pulses, no JVD, no peripheral edema  Respiratory: diffuse rhonchi with dullness at left base. No wheezes or rales. Respiratory effort increased  Abdomen: soft, non tender to palpation, positive bowel sounds, no guarding, no rebound  Skin: no rashes or lesions  Musculoskeletal: normal bulk and tone, no joint swelling  Psychiatric: normal mood and affect, A&OX3  Neurologic: CN 2-12 grossly intact, Motor strength 5/5 in all 4 groups with symmetric DTR's and non-focal sensory exam  Labs on Admission:  Basic Metabolic Panel: Recent Labs  Lab 11/15/17 1948  NA 139  K 4.2  CL 103  CO2 25  GLUCOSE 107*  BUN 23*  CREATININE 0.74  CALCIUM 9.3   Liver Function Tests: Recent Labs  Lab 11/15/17 1948  AST 33  ALT 23  ALKPHOS 119  BILITOT 1.0  PROT 7.5  ALBUMIN 3.6   No results for input(s): LIPASE, AMYLASE in the last 168 hours. No results for input(s): AMMONIA in the last 168 hours. CBC: Recent Labs  Lab 11/15/17 1948  WBC 22.4*  NEUTROABS 20.7*  HGB 16.0  HCT 49.1*  MCV 96.4  PLT 226   Cardiac Enzymes: Recent Labs  Lab 11/15/17 1948  TROPONINI 0.04*    BNP (last 3 results) Recent Labs    11/15/17 1948  BNP 460.0*    ProBNP (last 3 results) No results for input(s): PROBNP in the last 8760 hours.  CBG: No results for input(s): GLUCAP in the last 168 hours.  Radiological Exams on Admission: Dg Chest Portable 1 View  Result Date: 11/15/2017 CLINICAL DATA:  Shortness of breath.  History of colon cancer. EXAM: PORTABLE CHEST 1 VIEW COMPARISON:  PET scan of November 02, 2017. Prior radiographs are not available for comparison. FINDINGS: The heart size and mediastinal contours are within normal limits. No pneumothorax is noted. Atherosclerosis of thoracic aorta is noted. Emphysematous disease is noted throughout both lungs. Mild left basilar atelectasis or infiltrate is noted with minimal left pleural effusion. The  visualized skeletal structures are unremarkable. IMPRESSION: Mild left basilar atelectasis or infiltrate is noted with minimal left pleural effusion. Aortic Atherosclerosis (ICD10-I70.0) and Emphysema (ICD10-J43.9). Electronically Signed   By: Marijo Conception, M.D.   On: 11/15/2017 20:29    EKG: Independently reviewed.  Assessment/Plan Principal Problem:   Acute hypoxemic respiratory failure (HCC) Active Problems:   Squamous cell carcinoma of head and neck (HCC)   CAP (community acquired pneumonia)   Oral candidiasis   Will admit to floor with O2, SVN's, IV ABX, and IV fluids. Begin treatment for oral candidiasis. Consult Oncology. Repeat labs in AM  Diet: clear liquids Fluids: NS@75  DVT Prophylaxis: SQ Heparin  Code Status: FULL  Family Communication: yes  Disposition Plan: home  Time spent: 50 min

## 2017-11-16 ENCOUNTER — Other Ambulatory Visit: Payer: Self-pay | Admitting: *Deleted

## 2017-11-16 DIAGNOSIS — J9601 Acute respiratory failure with hypoxia: Secondary | ICD-10-CM

## 2017-11-16 DIAGNOSIS — C189 Malignant neoplasm of colon, unspecified: Secondary | ICD-10-CM

## 2017-11-16 DIAGNOSIS — A419 Sepsis, unspecified organism: Principal | ICD-10-CM

## 2017-11-16 DIAGNOSIS — C760 Malignant neoplasm of head, face and neck: Secondary | ICD-10-CM

## 2017-11-16 DIAGNOSIS — J189 Pneumonia, unspecified organism: Secondary | ICD-10-CM

## 2017-11-16 DIAGNOSIS — B370 Candidal stomatitis: Secondary | ICD-10-CM

## 2017-11-16 LAB — COMPREHENSIVE METABOLIC PANEL
ALK PHOS: 107 U/L (ref 38–126)
ALT: 21 U/L (ref 14–54)
AST: 21 U/L (ref 15–41)
Albumin: 3.2 g/dL — ABNORMAL LOW (ref 3.5–5.0)
Anion gap: 11 (ref 5–15)
BILIRUBIN TOTAL: 0.6 mg/dL (ref 0.3–1.2)
BUN: 22 mg/dL — AB (ref 6–20)
CALCIUM: 8.6 mg/dL — AB (ref 8.9–10.3)
CHLORIDE: 107 mmol/L (ref 101–111)
CO2: 21 mmol/L — ABNORMAL LOW (ref 22–32)
CREATININE: 0.7 mg/dL (ref 0.44–1.00)
Glucose, Bld: 103 mg/dL — ABNORMAL HIGH (ref 65–99)
Potassium: 3.8 mmol/L (ref 3.5–5.1)
Sodium: 139 mmol/L (ref 135–145)
TOTAL PROTEIN: 6.5 g/dL (ref 6.5–8.1)

## 2017-11-16 LAB — CBC
HCT: 44.5 % (ref 35.0–47.0)
Hemoglobin: 14.6 g/dL (ref 12.0–16.0)
MCH: 31.9 pg (ref 26.0–34.0)
MCHC: 32.9 g/dL (ref 32.0–36.0)
MCV: 96.9 fL (ref 80.0–100.0)
PLATELETS: 231 10*3/uL (ref 150–440)
RBC: 4.59 MIL/uL (ref 3.80–5.20)
RDW: 14.4 % (ref 11.5–14.5)
WBC: 19.9 10*3/uL — AB (ref 3.6–11.0)

## 2017-11-16 LAB — LACTIC ACID, PLASMA: Lactic Acid, Venous: 1.5 mmol/L (ref 0.5–1.9)

## 2017-11-16 MED ORDER — NYSTATIN 100000 UNIT/ML MT SUSP
5.0000 mL | Freq: Four times a day (QID) | OROMUCOSAL | Status: DC
  Administered 2017-11-16 – 2017-11-17 (×4): 500000 [IU] via OROMUCOSAL
  Filled 2017-11-16 (×4): qty 5

## 2017-11-16 MED ORDER — ORAL CARE MOUTH RINSE
15.0000 mL | Freq: Two times a day (BID) | OROMUCOSAL | Status: DC
  Administered 2017-11-16: 15 mL via OROMUCOSAL

## 2017-11-16 NOTE — Progress Notes (Signed)
[facility] NAME: Dana Perez    MR#:  [id number]  DATE OF BIRTH:  27-May-1941  SUBJECTIVE:   patient was admitted with PNA   Husband at bedside Patient with confusion which is baseline REVIEW OF SYSTEMS:    Review of Systems  Constitutional: Negative for fever, chills weight loss HENT: Negative for ear pain, nosebleeds, congestion, facial swelling, rhinorrhea, neck pain, neck stiffness and ear discharge.   Respiratory: ++ cough, shortness of breath, No wheezing  Cardiovascular: Negative for chest pain, palpitations and leg swelling.  Gastrointestinal: Negative for heartburn, abdominal pain, vomiting, diarrhea or consitpation Genitourinary: Negative for dysuria, urgency, frequency, hematuria Musculoskeletal: Negative for back pain or joint pain Neurological: Negative for dizziness, seizures, syncope, focal weakness,  numbness and headaches.  Hematological: Does not bruise/bleed easily.  Psychiatric/Behavioral: Negative for hallucinations, ++confusion, dysphoric mood    Tolerating Diet: yes      DRUG ALLERGIES:   Allergies  Allergen Reactions  . Other Other (See Comments) and Rash    TDAP  . Tetanus Antitoxin Rash    VITALS:  Blood pressure 129/64, pulse 97, temperature 98.4 F (36.9 C), temperature source Oral, resp. rate 18, height 5\' 4"  (1.626 m), weight 55.1 kg (121 lb 6.4 oz), SpO2 94 %.  PHYSICAL EXAMINATION:  Constitutional: Appears well-developed and well-nourished. No distress. HENT: Normocephalic. Dana Perez Kitchen Oropharynx is clear and moist.  Eyes: Conjunctivae and EOM are normal. PERRLA, no scleral icterus.  Neck: Normal ROM. Neck supple. No JVD. No tracheal deviation. CVS: RRR, S1/S2 +, no murmurs, no gallops, no carotid bruit.  Pulmonary: Effort and breath sounds normal, no stridor, rhonchi, wheezes, rales.  Abdominal: Soft. BS +,  no distension, tenderness, rebound or guarding.  Musculoskeletal: Normal range  of motion. No edema and no tenderness.  Neuro: Alert. CN 2-12 grossly intact. No focal deficits. Skin: Skin is warm and dry. No rash noted. Psychiatric: Normal mood and affect.      LABORATORY PANEL:   CBC Recent Labs  Lab 11/15/17 2347  WBC 19.9*  HGB 14.6  HCT 44.5  PLT 231   ------------------------------------------------------------------------------------------------------------------  Chemistries  Recent Labs  Lab 11/15/17 2347  NA 139  K 3.8  CL 107  CO2 21*  GLUCOSE 103*  BUN 22*  CREATININE 0.70  CALCIUM 8.6*  AST 21  ALT 21  ALKPHOS 107  BILITOT 0.6   ------------------------------------------------------------------------------------------------------------------  Cardiac Enzymes Recent Labs  Lab 11/15/17 1948  TROPONINI 0.04*   ------------------------------------------------------------------------------------------------------------------  RADIOLOGY:  Dg Chest Portable 1 View  Result Date: 11/15/2017 CLINICAL DATA:  Shortness of breath.  History of colon cancer. EXAM: PORTABLE CHEST 1 VIEW COMPARISON:  PET scan of November 02, 2017. Prior radiographs are not available for comparison. FINDINGS: The heart size and mediastinal contours are within normal limits. No pneumothorax is noted. Atherosclerosis of thoracic aorta is noted. Emphysematous disease is noted throughout both lungs. Mild left basilar atelectasis or infiltrate is noted with minimal left pleural effusion. The visualized skeletal structures are unremarkable. IMPRESSION: Mild left basilar atelectasis or infiltrate is noted with minimal left pleural effusion. Aortic Atherosclerosis (ICD10-I70.0) and Emphysema (ICD10-J43.9). Electronically Signed   By: Marijo Conception, M.D.   On: 11/15/2017 20:29     ASSESSMENT AND PLAN:   77 year old female with history of adenocarcinoma of colon and squamous cell carcinoma of head and neck presented to the emergency room due to shortness of breath and  fever.  1.  Sepsis: Patient presents with  fever, leukocytosis and tachycardia Sepsis due to left lower lobe pneumonia. Lactic acid and white blood cell count improving  2.  Acute hypoxic respiratory failure in the setting of left lower lobe pneumonia: Wean oxygen to room air  continue Zosyn.  3.  Oral thrush: Nystatin swish and swallow  4.  History of squamous cell carcinoma of the head and neck: Oncology evaluation requested   5.  Essential hypertension: Continue Norvasc  6.  Hyperlipidemia: Continue statin   Management plans discussed with the patient and husband and she is in agreement.  CODE STATUS: DNR  TOTAL TIME TAKING CARE OF THIS PATIENT: 30 minutes.     POSSIBLE D/C tomorrow, DEPENDING ON CLINICAL CONDITION.   Dana Perez M.D on 11/16/2017 at 11:14 AM  Between 7am to 6pm - Pager - [phone] After 6pm go to [email or web address] - password [email or web address] [facility]  Office  [phone]  CC: Primary care physician; Dana Axe, MD  Note: This dictation was prepared with Dragon dictation along with smaller phrase technology. Any transcriptional errors that result from this process are unintentional.

## 2017-11-16 NOTE — Evaluation (Signed)
Physical Therapy Evaluation Patient Details Name: Dana Perez MRN: [id number] DOB: 1940/08/27 Today's Date: 11/16/2017   History of Present Illness  Pt is a 77 y/o F who presented to hospital 11/15/17 w/ SOB, fever, cough, and chest pain. Pt admitted 11/15/17 w/ diagnosis of pneumonia, dehydration, sepsis, acute hypoxic respiratory failure, tachycardia, increased WBC. PMHx inludes: colon cancer, HTN COPD, osteoporosis, hyperlipidemia, squamous cell carcinoma head and neck.     Clinical Impression  Pt demonstrated bed mobility, transfers, CGA and ambulation (80 ft) RW CGA (+ 2 for chair follow/ line management) (see mobility section for details). Pt desats O2 w/ activity 88-90%, while on 2L; RN notified (trial of room air not appropriate at this time). Husband present and reports Pt BLE WBAT (07/2017 B ankle fx) and L ankle brace w/ mobility. Husband donned Pt L ankle brace prior to ambulation. Pt fatigued post ambulation 80 ft requesting to sit; Pt wheeled back to room in recliner; O2 sats increased to 94% within a few minutes on 2L. Pt would benefit from skilled PT to progress strength, transfers, and ambulation. Recommend HHPT upon discharge from acute hospitalization.    Follow Up Recommendations Home health PT;Supervision/Assistance - 24 hour    Equipment Recommendations  Rolling walker with 5" wheels;3in1 (PT)    Recommendations for Other Services       Precautions / Restrictions Precautions Precautions: Fall Required Braces or Orthoses: Other Brace/Splint Other Brace/Splint: L ankle brace Restrictions Weight Bearing Restrictions: Yes RLE Weight Bearing: Weight bearing as tolerated LLE Weight Bearing: Weight bearing as tolerated Other Position/Activity Restrictions: Pt had B ankle fx 07/2017. Husband reports that Pt is WBAT for both ankles and wears L ankle brace when ambulating.      Mobility  Bed Mobility Overal bed mobility: Modified Independent Bed Mobility: Supine to  Sit     Supine to sit: Min guard     General bed mobility comments: required extra time and effort w/ CGA for safety; vc's for use of handrail to pull torso forward  Transfers Overall transfer level: Needs assistance Equipment used: Rolling walker (2 wheeled) Transfers: Sit to/from Stand Sit to Stand: Min guard         General transfer comment: Pt demonstrated safe use of RW and good stability; CGA for safety  Ambulation/Gait Ambulation/Gait assistance: Min guard;+2 safety/equipment Ambulation Distance (Feet): 80 Feet Assistive device: Rolling walker (2 wheeled) Gait Pattern/deviations: Decreased step length - right;Decreased step length - left;Step-through pattern Gait velocity: decreased   General Gait Details: CGA for safety +2 to manage lines and chair follow for safety (due to Pt report of fatigues easily). Pt demonstrated safe use of RW, but fatigued at 27 ft and requested to sit; Pt was wheeled back to room in recliner.   Stairs            Wheelchair Mobility    Modified Rankin (Stroke Patients Only)       Balance Overall balance assessment: Needs assistance   Sitting balance-Leahy Scale: Good Sitting balance - Comments: good stability sitting EOB reaching within BOS managing clothing     Standing balance-Leahy Scale: Poor Standing balance comment: required RW CGA for stability                             Pertinent Vitals/Pain Pain Assessment: No/denies pain Pain Score: 0-No pain Pain Intervention(s): Limited activity within patient's tolerance;Monitored during session    Home Living Family/patient expects to be discharged  to:: Private residence Living Arrangements: Spouse/significant other Available Help at Discharge: Family;Available 24 hours/day Type of Home: House Home Access: Stairs to enter Entrance Stairs-Rails: Psychiatric nurse of Steps: 6 Home Layout: One level Home Equipment: Walker - 2 wheels;Cane -  single point;Wheelchair - manual;Bedside commode      Prior Function Level of Independence: Needs assistance   Gait / Transfers Assistance Needed: husband reports Pt ambulates household distances w/ RW and community distances w/ WC           Hand Dominance   Dominant Hand: Right    Extremity/Trunk Assessment   Upper Extremity Assessment Upper Extremity Assessment: Generalized weakness;RUE deficits/detail;LUE deficits/detail RUE Deficits / Details: Pt demonstrated shoulder flexion, elbow flexion/extension, pronation, supination AROM WNL and strength at least 3/5 LUE Deficits / Details: Pt demonstrated shoulder flexion, elbow flexion/extension, pronation, supination AROM WNL and strength at least 3/5    Lower Extremity Assessment Lower Extremity Assessment: Generalized weakness;RLE deficits/detail;LLE deficits/detail RLE Deficits / Details: Pt demonstrated hip flexion, knee flexion/extension AROM WNL and strength at least 3/5    Cervical / Trunk Assessment Cervical / Trunk Assessment: Kyphotic  Communication   Communication: No difficulties;HOH  Cognition Arousal/Alertness: Awake/alert Behavior During Therapy: WFL for tasks assessed/performed Overall Cognitive Status: Within Functional Limits for tasks assessed                                 General Comments: Pt A&O x4      General Comments   Pt agreeable to session.   Exercises     Assessment/Plan    PT Assessment Patient needs continued PT services  PT Problem List Decreased strength;Decreased activity tolerance;Decreased balance;Decreased mobility;Decreased coordination;Decreased knowledge of use of DME;Decreased safety awareness;Decreased knowledge of precautions;Cardiopulmonary status limiting activity       PT Treatment Interventions DME instruction;Balance training;Gait training;Functional mobility training;Therapeutic activities;Therapeutic exercise;Patient/family education    PT Goals  (Current goals can be found in the Care Plan section)  Acute Rehab PT Goals Patient Stated Goal: I want to get stronger PT Goal Formulation: With patient Time For Goal Achievement: 11/30/17 Potential to Achieve Goals: Fair    Frequency Min 2X/week   Barriers to discharge        Co-evaluation               AM-PAC PT "6 Clicks" Daily Activity  Outcome Measure Difficulty turning over in bed (including adjusting bedclothes, sheets and blankets)?: A Little Difficulty moving from lying on back to sitting on the side of the bed? : A Little Difficulty sitting down on and standing up from a chair with arms (e.g., wheelchair, bedside commode, etc,.)?: Unable Help needed moving to and from a bed to chair (including a wheelchair)?: A Little Help needed walking in hospital room?: A Little Help needed climbing 3-5 steps with a railing? : A Little 6 Click Score: 18    End of Session Equipment Utilized During Treatment: Gait belt;Oxygen Activity Tolerance: Patient limited by fatigue Patient left: in chair;with call bell/phone within reach;with chair alarm set;Other (comment);with family/visitor present(with respiratory therapist present; B heels elevated by pillow) Nurse Communication: Mobility status(O2 desats to 88-90% w/ activity while on 2 L) PT Visit Diagnosis: Unsteadiness on feet (R26.81);Other abnormalities of gait and mobility (R26.89);Muscle weakness (generalized) (M62.81);Difficulty in walking, not elsewhere classified (R26.2)    Time: 1448-1856 PT Time Calculation (min) (ACUTE ONLY): 36 min   Charges:  PT G Codes:        Arrie Aran, SPT 11/16/2017, 4:31 PM

## 2017-11-16 NOTE — Consult Note (Signed)
Hematology/Oncology Consult note [facility] [phone] [phone]  Patient Care Team: Glendon Axe, MD as PCP - General (Internal Medicine) Knox Royalty, RN as [place] [facility]   Name of the patient: Dana Perez  [id number]  [birth year]   Date of visit: 11/16/17 REASON FOR COSULTATION:  ENT cancer History of presenting illness-  This is a 77 year old female Stage IVa head and neck squamous cell carcinoma status post concurrent chemoradiation therapy, colon adenocarcinoma presented to the emergency room complaining of progressive shortness of breath fever and cough.  Patient was feeling better since admission.  Still has throat discomfort she is able to swallow solid food without difficulty.  Husband is at bedside.   Review of systems- Review of Systems  Constitutional: Positive for fever and malaise/fatigue. Negative for chills and weight loss.  HENT: Negative for nosebleeds.        Throat discomfort  Eyes: Negative for photophobia and pain.  Respiratory: Positive for cough.   Cardiovascular: Negative for chest pain.  Gastrointestinal: Negative for abdominal pain, heartburn, nausea and vomiting.  Genitourinary: Negative for dysuria.  Musculoskeletal: Negative for myalgias.  Skin: Negative for rash.  Neurological: Positive for weakness. Negative for dizziness.  Endo/Heme/Allergies: Does not bruise/bleed easily.  Psychiatric/Behavioral: Negative for depression and suicidal ideas.    Allergies  Allergen Reactions  . Other Other (See Comments) and Rash    TDAP  . Tetanus Antitoxin Rash    Patient Active Problem List   Diagnosis Date Noted  . CAP (community acquired pneumonia) 11/15/2017  . Acute hypoxemic respiratory failure ([place]) 11/15/2017  . Oral candidiasis 11/15/2017  . Acute respiratory failure ([place]) 11/15/2017  . Malnutrition of moderate degree 08/10/2017  . Closed left ankle fracture  08/07/2017  . Goals of care, counseling/discussion 07/14/2017  . Adenocarcinoma of colon ([place])   . Benign neoplasm of ascending colon   . Squamous cell carcinoma of head and neck ([place]) 07/01/2017  . Erythrocytosis 07/17/2015  . Pure hypercholesterolemia 03/15/2015  . Polycythemia, secondary 12/26/2014  . HLD (hyperlipidemia) 06/13/2014  . Essential (primary) hypertension 06/13/2014  . Hyperlipidemia, unspecified 06/13/2014  . Essential hypertension 06/13/2014  . Multiple thyroid nodules 01/22/2012     Past Medical History:  Diagnosis Date  . Adenocarcinoma of colon ([place])   . Benign neoplasm of ascending colon   . Essential hypertension 06/13/2014  . Goals of care, counseling/discussion 07/14/2017  . HLD (hyperlipidemia) 06/13/2014  . Hypercholesteremia   . Hyperlipidemia   . Multiple thyroid nodules 01/22/2012  . Osteoporosis   . Polycythemia, secondary 12/26/2014  . Pure hypercholesterolemia 03/15/2015  . Thyroid nodule      Past Surgical History:  Procedure Laterality Date  . ABDOMINAL HYSTERECTOMY    . COLONOSCOPY WITH PROPOFOL N/A 07/02/2017   Procedure: COLONOSCOPY WITH PROPOFOL;  Surgeon: Lucilla Lame, MD;  Location: [facility];  Service: Endoscopy;  Laterality: N/A;  . ORIF ANKLE FRACTURE Left 08/08/2017   Procedure: OPEN REDUCTION INTERNAL FIXATION (ORIF) ANKLE FRACTURE;  Surgeon: Dereck Leep, MD;  Location: [facility];  Service: Orthopedics;  Laterality: Left;  . POLYPECTOMY N/A 07/02/2017   Procedure: POLYPECTOMY;  Surgeon: Lucilla Lame, MD;  Location: [facility];  Service: Endoscopy;  Laterality: N/A;  . WRIST FRACTURE SURGERY  08/2009   badly broken    Social History   Socioeconomic History  . Marital status: Married    Spouse name: Not on file  . Number of children: Not on file  . Years  of education: Not on file  . Highest education level: Not on file  Occupational History  . Not on file  Social Needs  . Financial resource strain:  Patient refused  . Food insecurity:    Worry: Patient refused    Inability: Patient refused  . Transportation needs:    Medical: Patient refused    Non-medical: Patient refused  Tobacco Use  . Smoking status: Former Smoker    Packs/day: 0.25    Types: Cigarettes    Last attempt to quit: 06/17/2017    Years since quitting: 0.4  . Smokeless tobacco: Never Used  Substance and Sexual Activity  . Alcohol use: No  . Drug use: No  . Sexual activity: Not Currently  Lifestyle  . Physical activity:    Days per week: Patient refused    Minutes per session: Patient refused  . Stress: Patient refused  Relationships  . Social connections:    Talks on phone: Patient refused    Gets together: Patient refused    Attends religious service: Patient refused    Active member of club or organization: Patient refused    Attends meetings of clubs or organizations: Patient refused    Relationship status: Patient refused  . Intimate partner violence:    Fear of current or ex partner: Patient refused    Emotionally abused: Patient refused    Physically abused: Patient refused    Forced sexual activity: Patient refused  Other Topics Concern  . Not on file  Social History Narrative  . Not on file     Family History  Problem Relation Age of Onset  . Heart disease Mother   . Diabetes Father      Current Facility-Administered Medications:  .  0.9 %  sodium chloride infusion, , Intravenous, Continuous, Idelle Crouch, MD, Last Rate: 75 mL/hr at 11/16/17 1425 .  acetaminophen (TYLENOL) tablet 650 mg, 650 mg, Oral, Q6H PRN **OR** acetaminophen (TYLENOL) suppository 650 mg, 650 mg, Rectal, Q6H PRN, Idelle Crouch, MD .  amLODipine (NORVASC) tablet 5 mg, 5 mg, Oral, Daily, Idelle Crouch, MD, 5 mg at 11/16/17 1114 .  bisacodyl (DULCOLAX) suppository 10 mg, 10 mg, Rectal, Daily PRN, Idelle Crouch, MD .  clotrimazole [facility]) troche 10 mg, 10 mg, Oral, 5 X Daily, Idelle Crouch, MD,  10 mg at 11/16/17 1431 .  docusate sodium (COLACE) capsule 100 mg, 100 mg, Oral, BID, Idelle Crouch, MD, 100 mg at 11/16/17 1114 .  guaiFENesin (MUCINEX) 12 hr tablet 1,200 mg, 1,200 mg, Oral, BID, Idelle Crouch, MD, 1,200 mg at 11/16/17 1114 .  heparin injection 5,000 Units, 5,000 Units, Subcutaneous, Q8H, Idelle Crouch, MD, 5,000 Units at 11/16/17 1431 .  ipratropium-albuterol (DUONEB) 0.5-2.5 (3) MG/3ML nebulizer solution 3 mL, 3 mL, Nebulization, QID, Sparks, Leonie Douglas, MD, 3 mL at 11/16/17 1535 .  nystatin (MYCOSTATIN) 100000 UNIT/ML suspension 500,000 Units, 5 mL, Mouth/Throat, QID, Mody, Sital, MD, 500,000 Units at 11/16/17 1431 .  ondansetron (ZOFRAN) tablet 4 mg, 4 mg, Oral, Q6H PRN **OR** ondansetron (ZOFRAN) injection 4 mg, 4 mg, Intravenous, Q6H PRN, Idelle Crouch, MD .  pantoprazole (PROTONIX) injection 40 mg, 40 mg, Intravenous, Q12H, Idelle Crouch, MD, 40 mg at 11/16/17 1114 .  piperacillin-tazobactam (ZOSYN) IVPB 3.375 g, 3.375 g, Intravenous, Q8H, Coffee, Donna Christen, RPH, Last Rate: 12.5 mL/hr at 11/16/17 1427, 3.375 g at 11/16/17 1427 .  pravastatin (PRAVACHOL) tablet 10 mg, 10 mg, Oral, Daily, Sparks, Leonie Douglas, MD  Facility-Administered Medications Ordered in Other Encounters:  .  heparin lock flush 100 unit/mL, 250 Units, Intracatheter, PRN, Ma Hillock, Sandeep, MD .  heparin lock flush 100 unit/mL, 500 Units, Intracatheter, PRN, Ma Hillock, Sandeep, MD .  sodium chloride 0.9 % injection 10 mL, 10 mL, Intracatheter, PRN, Ma Hillock, Sandeep, MD .  sodium chloride 0.9 % injection 10 mL, 10 mL, Intracatheter, PRN, Ma Hillock, Sandeep, MD .  sodium chloride 0.9 % injection 10 mL, 10 mL, Intracatheter, PRN, Pandit, Sandeep, MD .  sodium chloride 0.9 % injection 10 mL, 10 mL, Intracatheter, PRN, Leia Alf, MD   Physical exam:  Vitals:   11/16/17 1500 11/16/17 1510 11/16/17 1520 11/16/17 1530  BP:      Pulse: 85 88 88 75  Resp:      Temp:      TempSrc:      SpO2: (!)  88% 90% (!) 88% 93%  Weight:      Height:       Constitutional: Appears well-developed and well-nourished. No distress. HENT: Normocephalic. Marland Kitchen Oropharynx is clear and moist.  No thrush Eyes: Conjunctivae and EOM are normal. PERRLA, no scleral icterus.  Neck: Normal ROM. Neck supple. No JVD. No tracheal deviation. CVS: RRR, S1/S2 +, no murmurs, no gallops, no carotid bruit.  Pulmonary: Effort and breath sounds normal, no stridor, rhonchi, wheezes, rales.  Abdominal: Soft. BS +,  no distension, tenderness, rebound or guarding.  Musculoskeletal: Normal range of motion. No edema and no tenderness.  Neuro: Alert. CN 2-12 grossly intact. No focal deficits. Skin: Skin is warm and dry. No rash noted. Psychiatric: Normal mood and affect       CMP Latest Ref Rng & Units 11/15/2017  Glucose 65 - 99 mg/dL 103(H)  BUN 6 - 20 mg/dL 22(H)  Creatinine 0.44 - 1.00 mg/dL 0.70  Sodium 135 - 145 mmol/L 139  Potassium 3.5 - 5.1 mmol/L 3.8  Chloride 101 - 111 mmol/L 107  CO2 22 - 32 mmol/L 21(L)  Calcium 8.9 - 10.3 mg/dL 8.6(L)  Total Protein 6.5 - 8.1 g/dL 6.5  Total Bilirubin 0.3 - 1.2 mg/dL 0.6  Alkaline Phos 38 - 126 U/L 107  AST 15 - 41 U/L 21  ALT 14 - 54 U/L 21   CBC Latest Ref Rng & Units 11/15/2017  WBC 3.6 - 11.0 K/uL 19.9(H)  Hemoglobin 12.0 - 16.0 g/dL 14.6  Hematocrit 35.0 - 47.0 % 44.5  Platelets 150 - 440 K/uL 231    @IMAGES @  Mr Jeri Cos Wo Contrast  Result Date: 11/12/2017 CLINICAL DATA:  Recent memory loss and confusion. Chemo and radiation for neck and colon cancer. EXAM: MRI HEAD WITHOUT AND WITH CONTRAST TECHNIQUE: Multiplanar, multiecho pulse sequences of the brain and surrounding structures were obtained without and with intravenous contrast. CONTRAST:  61mL MULTIHANCE GADOBENATE DIMEGLUMINE 529 MG/ML IV SOLN COMPARISON:  None. FINDINGS: The patient was unable to remain motionless for the exam. Small or subtle lesions could be overlooked. Brain: No evidence for acute  infarction, hemorrhage, mass lesion, hydrocephalus, or extra-axial fluid. Generalized atrophy. Hypoattenuation of white matter, most consistent with small vessel disease. Post infusion, no abnormal enhancement of the brain or meninges. Vascular: Flow voids are maintained throughout the carotid, basilar, and vertebral arteries. There are no areas of chronic hemorrhage. Skull and upper cervical spine: Post XRT changes in the cervical region. No worrisome osseous lesion. Sinuses/Orbits: No significant sinus or mastoid fluid. Negative orbits. Other: RIGHT mastoid effusion.  No nasopharyngeal process. IMPRESSION: Atrophy and small vessel  disease. No acute intracranial findings. No evidence for intracranial metastatic disease. Electronically Signed   By: Staci Righter M.D.   On: 11/12/2017 15:13   Nm Pet Image Restag (ps) Skull Base To Thigh  Result Date: 11/02/2017 CLINICAL DATA:  Subsequent treatment strategy for squamous cell carcinoma of the neck. Colon cancer. EXAM: NUCLEAR MEDICINE PET SKULL BASE TO THIGH TECHNIQUE: 6.2 mCi F-18 FDG was injected intravenously. Full-ring PET imaging was performed from the skull base to thigh after the radiotracer. CT data was obtained and used for attenuation correction and anatomic localization. Fasting blood glucose: 97 mg/dl Mediastinal blood pool activity: SUV max 2.4 COMPARISON:  06/22/2017 FINDINGS: NECK: The right level 1b mass measures 2.2 by 1.5 cm (formerly 2.9 by 2.3 cm) and has a maximum standard uptake value of 10.3 (formerly 16.1). The small lymph node interposed between the parotid and right submandibular glands previously measured 0.6 cm in short axis and currently measures 0.4 cm. This structure reportedly previously had a metabolic activity of 3.5, currently 1.5. As before, a discrete mucosal lesion is not seen. Incidental CT findings: Right mastoid effusion. Bilateral carotid atherosclerotic calcification. CHEST: No significant hypermetabolic activity in the  chest. Incidental CT findings: Coronary, aortic arch, and branch vessel atherosclerotic vascular disease. Small pericardial effusion settled posteriorly. Trace pleural fluid bilaterally. Old granulomatous disease. Centrilobular emphysema. ABDOMEN/PELVIS: No significant abnormal hypermetabolic activity in the abdomen/pelvis. Incidental CT findings: Aortoiliac atherosclerotic vascular disease. Infrarenal abdominal aortic ectasia without overt aneurysm. SKELETON: No significant hypermetabolic activity. Incidental CT findings: none IMPRESSION: 1. Improved appearance with the right level 1b mass reduced in size and metabolic activity compared to the prior exam. There is still residual abnormal activity, with maximum SUV currently 10.3. No findings of additional spread of disease. 2. Other imaging findings of potential clinical significance: Right mastoid effusion. Aortic Atherosclerosis (ICD10-I70.0) and Emphysema (ICD10-J43.9). Coronary atherosclerosis. Small pericardial effusion and trace bilateral pleural effusions. Electronically Signed   By: Van Clines M.D.   On: 11/02/2017 15:12   Dg Chest Portable 1 View  Result Date: 11/15/2017 CLINICAL DATA:  Shortness of breath.  History of colon cancer. EXAM: PORTABLE CHEST 1 VIEW COMPARISON:  PET scan of November 02, 2017. Prior radiographs are not available for comparison. FINDINGS: The heart size and mediastinal contours are within normal limits. No pneumothorax is noted. Atherosclerosis of thoracic aorta is noted. Emphysematous disease is noted throughout both lungs. Mild left basilar atelectasis or infiltrate is noted with minimal left pleural effusion. The visualized skeletal structures are unremarkable. IMPRESSION: Mild left basilar atelectasis or infiltrate is noted with minimal left pleural effusion. Aortic Atherosclerosis (ICD10-I70.0) and Emphysema (ICD10-J43.9). Electronically Signed   By: Marijo Conception, M.D.   On: 11/15/2017 20:29    Assessment and  plan- Patient is a 77 y.o. female with stage IV head and neck squamous cell carcinoma status post concurrent chemoradiation adenoma of colon presented to emergency room due to shortness of breath and fever.  #Acute hypoxic respiratory failure in the setting of left lower lobe pneumonia: New oxygen supplementation.  Continue IV Zosyn. #Oral thrush: Improving with nystatin swish and swallow #Sepsis due to left lower lobe pneumonia.  Continue IV antibiotics. #Stage IVa head and neck squamous carcinoma: Recent PET scan showed good treatment response compared to previous exam with residual hyper metabolic activity.  Patient can follow-up with Dr. Grayland Ormond outpatient.  #Adenocarcinoma of colon: Follow-up with surgery to finalize plan of surgery.   Thank you for the opportunity to participate in the care  of this patient.   Earlie Server, MD, PhD Hematology Oncology [facility] Pager- [phone] 11/16/2017

## 2017-11-16 NOTE — Patient Outreach (Signed)
[facility]) Care Management [place] Coordination 11/16/2017  Dana Perez [facility] 1941-05-06 [id number]  [place] CM Care Coordination outreach re: Dana, [age] y.o.femalereferred originally to [place] by insurance plan; screening call by [facility] telephonic RN CM revealed recent hospitalization December 21-24, 2018 for bilateral ankle fractures; patient was discharged to SNF for rehabilitation, and discharged to home from SNF / self-care with home health [facility]) services in place, on September 26, 2017. Patient referred to [place] by [facility] CSW for transition of care following SNF discharge on 09/26/17.Patient has history including, but not limited to, HTN/ HLD; colon cancer and head/ neck cancer  Noted from review of EMR that patient had been admitted to hospital November 15, 2017 for HCAP, Acute respiratory Failure with hypoxia, dehydration.  Secure communication via EMR sent to [place], Natividad Brood, and [employer] notifying of patient's current hospital admission  Plan:  [place] RN CM will follow patient's progress while hospitalized and collaborate with [facility] liaison's for discharge disposition.  Oneta Rack, RN, BSN, [employer] [facility] Care Management  [phone]

## 2017-11-16 NOTE — Care Management Note (Signed)
Case Management Note  Patient Details  Name: VAIL BASISTA MRN: [id number] Date of Birth: 04-04-1941  Subjective/Objective:    Admitted to [facility] with the diagnosis of acute hypoxemic respiratory failure. Lives with husband, Jerold Coombe [phone]). Last seen Dr. Candiss Norse 09/2017. Prescriptions are filled at [facility] in [place]. [facility] has finished occupational,  physical therapy and aide services in the home. Skilled nursing is still coming to the home. [facility] 07/2017-09/26/17.   No home oxygen. Flexible couch, wheelchair, bedside commode, shower bench, and canes in the home.  Last fall was 08/07/17, broke both ankles. Appetite  Comes and goes.  Finished chemotherapy 09/24/17.              Action/Plan: Physical therapy evaluation pending. Husband isn't quite sure if he would like the therapies in the home anymore. Will request palliative consult   Expected Discharge Date:                  Expected Discharge Plan:     In-House Referral:     Discharge planning Services     Post Acute Care Choice:    Choice offered to:     DME Arranged:    DME Agency:     HH Arranged:    HH Agency:     Status of Service:     If discussed at [employer] of Stay Meetings, dates discussed:    Additional Comments:  Shelbie Ammons, RN MSN [place] [phone] 11/16/2017, 11:18 AM

## 2017-11-16 NOTE — Progress Notes (Signed)
Family Meeting Note  Advance Directive:yes  Today a meeting took place with the spouse.  Patient is unable to participate due [id number] capacity confusion at baseline   The following clinical team members were present during this meeting:MD  The following were discussed:Patient's diagnosis: sepsis with acute hypoxic respiratory failure due to pneumonia History of colon and head and neck cancer , Patient's progosis: > 12 months and Goals for treatment: DNR  Additional follow-up to be provided: DNR order written in chart   Time spent during discussion:20 minutes  Charlynn Salih, MD

## 2017-11-17 LAB — CBC
HEMATOCRIT: 36.2 % (ref 35.0–47.0)
HEMOGLOBIN: 11.9 g/dL — AB (ref 12.0–16.0)
MCH: 31.6 pg (ref 26.0–34.0)
MCHC: 32.9 g/dL (ref 32.0–36.0)
MCV: 96 fL (ref 80.0–100.0)
Platelets: 217 10*3/uL (ref 150–440)
RBC: 3.77 MIL/uL — AB (ref 3.80–5.20)
RDW: 14.2 % (ref 11.5–14.5)
WBC: 14.3 10*3/uL — ABNORMAL HIGH (ref 3.6–11.0)

## 2017-11-17 MED ORDER — FUROSEMIDE 10 MG/ML IJ SOLN
20.0000 mg | Freq: Once | INTRAMUSCULAR | Status: AC
  Administered 2017-11-17: 10:00:00 20 mg via INTRAVENOUS
  Filled 2017-11-17: qty 2

## 2017-11-17 MED ORDER — NYSTATIN 100000 UNIT/ML MT SUSP: 5.0000 mL | Freq: Four times a day (QID) | OROMUCOSAL | 0 refills | Status: DC

## 2017-11-17 MED ORDER — PANTOPRAZOLE SODIUM 40 MG PO TBEC
40.0000 mg | DELAYED_RELEASE_TABLET | Freq: Two times a day (BID) | ORAL | Status: DC
  Administered 2017-11-17: 40 mg via ORAL
  Filled 2017-11-17: qty 1

## 2017-11-17 MED ORDER — AMOXICILLIN-POT CLAVULANATE 875-125 MG PO TABS: 1.0000 | ORAL_TABLET | Freq: Two times a day (BID) | ORAL | 0 refills | Status: DC

## 2017-11-17 NOTE — Plan of Care (Signed)
  Problem: Respiratory: Goal: Ability to maintain adequate ventilation will improve Outcome: Progressing Goal: Ability to maintain a clear airway will improve Outcome: Progressing   Problem: Education: Goal: Knowledge of General Education information will improve Outcome: Progressing   Problem: Health Behavior/Discharge Planning: Goal: Ability to manage health-related needs will improve Outcome: Progressing

## 2017-11-17 NOTE — Progress Notes (Signed)
Pharmacy Antibiotic Note  Dana Perez is a 77 y.o. female admitted on 11/15/2017 with pneumonia.  Pharmacy has been consulted for piperacillin/tazobactam dosing.  This is day #3 of IV antibiotics.  Plan: Continue piperacillin/tazobactam 3.375 g IV q8h EI  Height: 5\' 4"  (162.6 cm) Weight: 127 lb 4.8 oz (57.7 kg) IBW/kg (Calculated) : 54.7  Temp (24hrs), Avg:98.1 F (36.7 C), Min:97.8 F (36.6 C), Max:98.4 F (36.9 C)  Recent Labs  Lab 11/15/17 1948 11/15/17 2347 11/17/17 0645  WBC 22.4* 19.9* 14.3*  CREATININE 0.74 0.70  --   LATICACIDVEN 2.0* 1.5  --     Estimated Creatinine Clearance: 53.3 mL/min (by C-G formula based on SCr of 0.7 mg/dL).    Allergies  Allergen Reactions  . Other Other (See Comments) and Rash    TDAP  . Tetanus Antitoxin Rash    Thank you for allowing pharmacy to be a part of this patient's care.  Lenis Noon, PharmD, BCPS Clinical Pharmacist 11/17/2017 8:51 AM

## 2017-11-17 NOTE — Progress Notes (Signed)
[facility] [place] NAME: Dana Perez    MR#:  [id number]  DATE OF BIRTH:  1941-01-25  SUBJECTIVE:   patient was admitted with PNA   Husband at bedside Patient with confusion which is baseline REVIEW OF SYSTEMS:    Review of Systems  Constitutional: Negative for fever, chills weight loss HENT: Negative for ear pain, nosebleeds, congestion, facial swelling, rhinorrhea, neck pain, neck stiffness and ear discharge.   Respiratory: improved cough, shortness of breath, No wheezing  Cardiovascular: Negative for chest pain, palpitations and leg swelling.  Gastrointestinal: Negative for heartburn, abdominal pain, vomiting, diarrhea or consitpation Genitourinary: Negative for dysuria, urgency, frequency, hematuria Musculoskeletal: Negative for back pain or joint pain Neurological: Negative for dizziness, seizures, syncope, focal weakness,  numbness and headaches.  Hematological: Does not bruise/bleed easily.  Psychiatric/Behavioral: Negative for hallucinations, ++confusion, dysphoric mood    Tolerating Diet: yes      DRUG ALLERGIES:   Allergies  Allergen Reactions  . Other Other (See Comments) and Rash    TDAP  . Tetanus Antitoxin Rash    VITALS:  Blood pressure 136/63, pulse (!) 112, temperature 98.1 F (36.7 C), temperature source Oral, resp. rate (!) 22, height 5\' 4"  (1.626 m), weight 57.7 kg (127 lb 4.8 oz), SpO2 93 %.  PHYSICAL EXAMINATION:  Constitutional: Appears well-developed and well-nourished. No distress. HENT: Normocephalic. Marland Kitchen Oropharynx is clear and moist.  Eyes: Conjunctivae and EOM are normal. PERRLA, no scleral icterus.  Neck: Normal ROM. Neck supple. No JVD. No tracheal deviation. CVS: RRR, S1/S2 +, no murmurs, no gallops, no carotid bruit.  Pulmonary: Effort and breath sounds normal, no stridor, rhonchi, wheezes, rales.  Abdominal: Soft. BS +,  no distension, tenderness, rebound or guarding.   Musculoskeletal: Normal range of motion. No edema and no tenderness.  Neuro: Alert. CN 2-12 grossly intact. No focal deficits. Skin: Skin is warm and dry. No rash noted. Psychiatric: Normal mood and affect.      LABORATORY PANEL:   CBC Recent Labs  Lab 11/17/17 0645  WBC 14.3*  HGB 11.9*  HCT 36.2  PLT 217   ------------------------------------------------------------------------------------------------------------------  Chemistries  Recent Labs  Lab 11/15/17 2347  NA 139  K 3.8  CL 107  CO2 21*  GLUCOSE 103*  BUN 22*  CREATININE 0.70  CALCIUM 8.6*  AST 21  ALT 21  ALKPHOS 107  BILITOT 0.6   ------------------------------------------------------------------------------------------------------------------  Cardiac Enzymes Recent Labs  Lab 11/15/17 1948  TROPONINI 0.04*   ------------------------------------------------------------------------------------------------------------------  RADIOLOGY:  Dg Chest Portable 1 View  Result Date: 11/15/2017 CLINICAL DATA:  Shortness of breath.  History of colon cancer. EXAM: PORTABLE CHEST 1 VIEW COMPARISON:  PET scan of November 02, 2017. Prior radiographs are not available for comparison. FINDINGS: The heart size and mediastinal contours are within normal limits. No pneumothorax is noted. Atherosclerosis of thoracic aorta is noted. Emphysematous disease is noted throughout both lungs. Mild left basilar atelectasis or infiltrate is noted with minimal left pleural effusion. The visualized skeletal structures are unremarkable. IMPRESSION: Mild left basilar atelectasis or infiltrate is noted with minimal left pleural effusion. Aortic Atherosclerosis (ICD10-I70.0) and Emphysema (ICD10-J43.9). Electronically Signed   By: Marijo Conception, M.D.   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  1. Sepsis: Patient presents with fever,  leukocytosis and tachycardia Sepsis is due to left lower lobe pneumonia. Lactic acid and white blood cell count have improved.     2. Acute hypoxic respiratory failure in the setting of left lower lobe pneumonia: She was on Zosyn and will be discharged on oral Augmentin.    3. Oral thrush: This is improved but she will need to continue nystatin swish and swallow  4. History of squamous cell carcinoma of the head and neck: She was evaluated oncology while in the hospital.  She will follow-up with Dr. Grayland Ormond.  5. Essential hypertension: Continue Norvasc  6. Hyperlipidemia: Continue statin    Management plans discussed with the patient and husband and she is in agreement.  CODE STATUS: DNR  TOTAL TIME TAKING CARE OF THIS PATIENT: 24 minutes.     POSSIBLE D/C today if off of O2 DEPENDING ON CLINICAL CONDITION.   Lasheena Frieze M.D on 11/17/2017 at 10:36 AM  Between 7am to 6pm - Pager - [phone] After 6pm go to [email or web address] - password [email or web address] [facility]  Office  [phone]  CC: Primary care physician; Glendon Axe, MD  Note: This dictation was prepared with Dragon dictation along with smaller phrase technology. Any transcriptional errors that result from this process are unintentional.

## 2017-11-17 NOTE — Discharge Summary (Signed)
[facility] [facility] [place] NAME: Dana Perez    MR#:  [id number]  DATE OF BIRTH:  [birth year]  DATE OF ADMISSION:  11/15/2017 ADMITTING PHYSICIAN: Idelle Crouch, MD  DATE OF DISCHARGE: 11/17/2017  PRIMARY CARE PHYSICIAN: Glendon Axe, MD    ADMISSION DIAGNOSIS:  Dehydration [E86.0] Healthcare-associated pneumonia [J18.9] Sepsis, due to unspecified organism ([place]) [A41.9]  DISCHARGE DIAGNOSIS:  Principal Problem:   Acute hypoxemic respiratory failure ([place]) Active Problems:   Squamous cell carcinoma of head and neck ([place])   CAP (community acquired pneumonia)   Oral candidiasis   Acute respiratory failure ([place])   Healthcare-associated pneumonia   SECONDARY DIAGNOSIS:   Past Medical History:  Diagnosis Date  . Adenocarcinoma of colon ([place])   . Benign neoplasm of ascending colon   . Essential hypertension 06/13/2014  . Goals of care, counseling/discussion 07/14/2017  . HLD (hyperlipidemia) 06/13/2014  . Hypercholesteremia   . Hyperlipidemia   . Multiple thyroid nodules 01/22/2012  . Osteoporosis   . Polycythemia, secondary 12/26/2014  . Pure hypercholesterolemia 03/15/2015  . Thyroid nodule     HOSPITAL COURSE:   77 year old female with history of adenocarcinoma of colon and squamous cell carcinoma of head and neck presented to the emergency room due to shortness of breath and fever.  1.  Sepsis: Patient presents with fever, leukocytosis and tachycardia Sepsis is due to left lower lobe pneumonia. Lactic acid and white blood cell count have improved.     2.  Acute hypoxic respiratory failure in the setting of left lower lobe pneumonia: She was on Zosyn and will be discharged on oral Augmentin.    3.  Oral thrush: This is improved but she will need to continue nystatin swish and swallow  4.  History of squamous cell carcinoma of the head and neck: She was evaluated oncology while in the hospital.  She will follow-up with Dr.  Grayland Ormond.  5.  Essential hypertension: Continue Norvasc  6.  Hyperlipidemia: Continue statin     DISCHARGE CONDITIONS AND DIET:  Stable for discharge  heart healthy diet  CONSULTS OBTAINED:  Treatment Team:  [name], MD Earlie Server, MD  DRUG ALLERGIES:   Allergies  Allergen Reactions  . Other Other (See Comments) and Rash    TDAP  . Tetanus Antitoxin Rash    DISCHARGE MEDICATIONS:   Allergies as of 11/17/2017      Reactions   Other Other (See Comments), Rash   TDAP   Tetanus Antitoxin Rash      Medication List    TAKE these medications   acetaminophen 500 MG tablet Commonly known as:  TYLENOL Take 500 mg by mouth every 6 (six) hours as needed.   amLODipine 5 MG tablet Commonly known as:  NORVASC Take 5 mg by mouth daily.   amoxicillin-clavulanate 875-125 MG tablet Commonly known as:  AUGMENTIN Take 1 tablet by mouth 2 (two) times daily.   calcium carbonate 1500 (600 Ca) MG Tabs tablet Commonly known as:  OSCAL Take 600 mg of elemental calcium by mouth daily with breakfast.   nystatin 100000 UNIT/ML suspension Commonly known as:  MYCOSTATIN Use as directed 5 mLs (500,000 Units total) in the mouth or throat 4 (four) times daily.   pravastatin 10 MG tablet Commonly known as:  PRAVACHOL Take 10 mg by mouth daily.   prochlorperazine 10 MG tablet Commonly known as:  COMPAZINE TAKE 1 TABLET (10 MG TOTAL) EVERY 6 (SIX) HOURS AS NEEDED BY MOUTH (  NAUSEA OR VOMITING).            Durable Medical Equipment  (From admission, onward)        Start     Ordered   11/17/17 1034  For home use only DME Gilford Rile  [facility])  Once    Question:  Patient needs a walker to treat with the following condition  Answer:  Weakness   11/17/17 1033        Today   CHIEF COMPLAINT:  Doing well no acute issues overnight.   VITAL SIGNS:  Blood pressure 136/63, pulse (!) 112, temperature 98.1 F (36.7 C), temperature source Oral, resp. rate (!) 22, height  5\' 4"  (1.626 m), weight 57.7 kg (127 lb 4.8 oz), SpO2 93 %.   REVIEW OF SYSTEMS:  Review of Systems  Constitutional: Negative.  Negative for chills, fever and malaise/fatigue.  HENT: Negative.  Negative for ear discharge, ear pain, hearing loss, nosebleeds and sore throat.   Eyes: Negative.  Negative for blurred vision and pain.  Respiratory: Negative.  Negative for cough, hemoptysis, shortness of breath and wheezing.   Cardiovascular: Negative.  Negative for chest pain, palpitations and leg swelling.  Gastrointestinal: Negative.  Negative for abdominal pain, blood in stool, diarrhea, nausea and vomiting.  Genitourinary: Negative.  Negative for dysuria.  Musculoskeletal: Negative.  Negative for back pain.  Skin: Negative.   Neurological: Negative for dizziness, tremors, speech change, focal weakness, seizures and headaches.  Endo/Heme/Allergies: Negative.  Does not bruise/bleed easily.  Psychiatric/Behavioral: Positive for memory loss. Negative for depression, hallucinations and suicidal ideas.     PHYSICAL EXAMINATION:  GENERAL:  77 y.o.-year-old patient lying in the bed with no acute distress.  NECK:  Supple, no jugular venous distention. No thyroid enlargement, no tenderness.  LUNGS: Normal breath sounds bilaterally, no wheezing, rales,rhonchi  No use of accessory muscles of respiration.  CARDIOVASCULAR: S1, S2 normal. No murmurs, rubs, or gallops.  ABDOMEN: Soft, non-tender, non-distended. Bowel sounds present. No organomegaly or mass.  EXTREMITIES: No pedal edema, cyanosis, or clubbing.  PSYCHIATRIC: The patient is alert and oriented x 3.  SKIN: No obvious rash, lesion, or ulcer.   DATA REVIEW:   CBC Recent Labs  Lab 11/17/17 0645  WBC 14.3*  HGB 11.9*  HCT 36.2  PLT 217    Chemistries  Recent Labs  Lab 11/15/17 2347  NA 139  K 3.8  CL 107  CO2 21*  GLUCOSE 103*  BUN 22*  CREATININE 0.70  CALCIUM 8.6*  AST 21  ALT 21  ALKPHOS 107  BILITOT 0.6     Cardiac Enzymes Recent Labs  Lab 11/15/17 1948  TROPONINI 0.04*    Microbiology Results  @[email or web address] @  RADIOLOGY:  Dg Chest Portable 1 View  Result Date: 11/15/2017 CLINICAL DATA:  Shortness of breath.  History of colon cancer. EXAM: PORTABLE CHEST 1 VIEW COMPARISON:  PET scan of November 02, 2017. Prior radiographs are not available for comparison. FINDINGS: The heart size and mediastinal contours are within normal limits. No pneumothorax is noted. Atherosclerosis of thoracic aorta is noted. Emphysematous disease is noted throughout both lungs. Mild left basilar atelectasis or infiltrate is noted with minimal left pleural effusion. The visualized skeletal structures are unremarkable. IMPRESSION: Mild left basilar atelectasis or infiltrate is noted with minimal left pleural effusion. Aortic Atherosclerosis (ICD10-I70.0) and Emphysema (ICD10-J43.9). Electronically Signed   By: Marijo Conception, M.D.   On: 11/15/2017 20:29      Allergies as of 11/17/2017  Reactions   Other Other (See Comments), Rash   TDAP   Tetanus Antitoxin Rash      Medication List    TAKE these medications   acetaminophen 500 MG tablet Commonly known as:  TYLENOL Take 500 mg by mouth every 6 (six) hours as needed.   amLODipine 5 MG tablet Commonly known as:  NORVASC Take 5 mg by mouth daily.   amoxicillin-clavulanate 875-125 MG tablet Commonly known as:  AUGMENTIN Take 1 tablet by mouth 2 (two) times daily.   calcium carbonate 1500 (600 Ca) MG Tabs tablet Commonly known as:  OSCAL Take 600 mg of elemental calcium by mouth daily with breakfast.   nystatin 100000 UNIT/ML suspension Commonly known as:  MYCOSTATIN Use as directed 5 mLs (500,000 Units total) in the mouth or throat 4 (four) times daily.   pravastatin 10 MG tablet Commonly known as:  PRAVACHOL Take 10 mg by mouth daily.   prochlorperazine 10 MG tablet Commonly known as:  COMPAZINE TAKE 1 TABLET (10 MG TOTAL) EVERY 6 (SIX) HOURS  AS NEEDED BY MOUTH (NAUSEA OR VOMITING).            Durable Medical Equipment  (From admission, onward)        Start     Ordered   11/17/17 1034  For home use only DME Gilford Rile  [facility])  Once    Question:  Patient needs a walker to treat with the following condition  Answer:  Weakness   11/17/17 1033          Management plans discussed with the patient and husband and they are in agreement. Stable for discharge   Patient should follow up with pcp  CODE STATUS:     Code Status Orders  (From admission, onward)        Start     Ordered   11/16/17 1112  Do not attempt resuscitation (DNR)  Continuous    Question Answer Comment  In the event of cardiac or respiratory ARREST Do not call a "code blue"   In the event of cardiac or respiratory ARREST Do not perform Intubation, CPR, defibrillation or ACLS   In the event of cardiac or respiratory ARREST Use medication by any route, position, wound care, and other measures to relive pain and suffering. May use oxygen, suction and manual treatment of airway obstruction as needed for comfort.      11/16/17 1111    Code Status History    Date Active Date Inactive Code Status Order ID Comments User Context   11/15/2017 2301 11/16/2017 1111 Full Code [id number]  Idelle Crouch, MD Inpatient   08/07/2017 1646 08/10/2017 1852 Full Code [id number]  Demetrios Loll, MD Inpatient      TOTAL TIME TAKING CARE OF THIS PATIENT: 38 minutes.    Note: This dictation was prepared with Dragon dictation along with smaller phrase technology. Any transcriptional errors that result from this process are unintentional.  Narya Beavin M.D on 11/17/2017 at 10:33 AM  Between 7am to 6pm - Pager - [phone] After 6pm go to [email or web address] - password [email or web address] [facility]  Office  [phone]  CC: Primary care physician; Glendon Axe, MD

## 2017-11-17 NOTE — Progress Notes (Signed)
PHARMACIST - PHYSICIAN COMMUNICATION  DR:   Benjie Karvonen  CONCERNING: IV to Oral Route Change Policy  RECOMMENDATION: This patient is receiving pantoprazole by the intravenous route.  Based on criteria approved by the Pharmacy and Therapeutics Committee, the intravenous medication(s) is/are being converted to the equivalent oral dose form(s).   DESCRIPTION: These criteria include:  The patient is eating (either orally or via tube) and/or has been taking other orally administered medications for a least 24 hours  The patient has no evidence of active gastrointestinal bleeding or impaired GI absorption (gastrectomy, short bowel, patient on TNA or NPO).  If you have questions about this conversion, please contact the [place], [facility] 11/17/2017 8:48 AM

## 2017-11-17 NOTE — Care Management (Signed)
Discharge to home today per Dr. Benjie Karvonen. Would like Home Health services per [facility]. Telephone call to [place] at [facility]. Husband states they already have a rolling walker.  Husband will transport Shelbie Ammons RN MSN [facility] [phone]

## 2017-11-17 NOTE — Progress Notes (Signed)
Pt for discharge home. Husband to transport. No resp distress.sl d/cd. Instructions discussed with pts husband. Diet activity and f/u discussed. Verbalizes understanding.

## 2017-11-18 NOTE — Progress Notes (Signed)
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  INTERVAL HISTORY: Patient returns to clinic today for further evaluation and discussion of her MRI results.  She is admitted to the hospital over the weekend and diagnosed with pneumonia.  She feels improved since admission.  She continues to have significant weakness and fatigue, but does not appear as confused as she has been in recent weeks.  She reports an improved appetite.  She has no focal neurologic complaints. She has no chest pain or shortness of breath. She denies any nausea, vomiting, constipation, or diarrhea.  She has no melena or hematochezia.  She has no urinary complaints. Patient offers no further specific complaints today.  REVIEW OF SYSTEMS:   Review of Systems  Constitutional: Positive for malaise/fatigue. Negative for fever and weight loss.  HENT: Negative.  Negative for sore throat.   Respiratory: Negative.  Negative for cough and shortness of breath.   Cardiovascular: Negative.  Negative for chest pain and leg swelling.  Gastrointestinal: Negative.  Negative for abdominal pain, constipation, nausea and vomiting.  Genitourinary: Negative.   Musculoskeletal: Positive for joint pain. Negative for falls.  Skin: Negative.  Negative for rash.  Neurological: Positive for weakness. Negative for sensory change, focal weakness and headaches.  Psychiatric/Behavioral: Positive for memory loss. Negative for depression. The patient is nervous/anxious. The patient does not have insomnia.     As per HPI. Otherwise, a complete review of systems is negative.  PAST MEDICAL HISTORY: Past Medical History:    Diagnosis Date  . Adenocarcinoma of colon ([place])   . Benign neoplasm of ascending colon   . Essential hypertension 06/13/2014  . Goals of care, counseling/discussion 07/14/2017  . HLD (hyperlipidemia) 06/13/2014  . Hypercholesteremia   . Hyperlipidemia   . Multiple thyroid nodules 01/22/2012  . Osteoporosis   . Polycythemia, secondary 12/26/2014  . Pure hypercholesterolemia 03/15/2015  . Thyroid nodule     PAST SURGICAL HISTORY: Past Surgical History:  Procedure Laterality Date  . ABDOMINAL HYSTERECTOMY    . COLONOSCOPY WITH PROPOFOL N/A 07/02/2017   Procedure: COLONOSCOPY WITH PROPOFOL;  Surgeon: Lucilla Lame, MD;  Location: [facility];  Service: Endoscopy;  Laterality: N/A;  . ORIF ANKLE FRACTURE Left 08/08/2017   Procedure: OPEN REDUCTION INTERNAL FIXATION (ORIF) ANKLE FRACTURE;  Surgeon: Dereck Leep, MD;  Location: [facility];  Service: Orthopedics;  Laterality: Left;  . POLYPECTOMY N/A 07/02/2017   Procedure: POLYPECTOMY;  Surgeon: Lucilla Lame, MD;  Location: [facility];  Service: Endoscopy;  Laterality: N/A;  . WRIST FRACTURE SURGERY  08/2009   badly broken    FAMILY HISTORY Family History  Problem Relation Age of Onset  . Heart disease Mother   . Diabetes Father        ADVANCED DIRECTIVES:    HEALTH MAINTENANCE: Social History   Tobacco Use  . Smoking status: Former Smoker    Packs/day: 0.25    Types: Cigarettes    Last attempt to quit: 06/17/2017    Years since quitting: 0.4  . Smokeless tobacco: Never Used  Substance Use Topics  . Alcohol use: No  . Drug use: No     Allergies  Allergen Reactions  . Other Other (See  Comments) and Rash    TDAP  . Tetanus Antitoxin Rash    Current Outpatient Medications  Medication Sig Dispense Refill  . acetaminophen (TYLENOL) 500 MG tablet Take 500 mg by mouth every 6 (six) hours as needed.    Marland Kitchen amLODipine (NORVASC) 5 MG tablet Take 5 mg by mouth daily.     Marland Kitchen amoxicillin-clavulanate  (AUGMENTIN) 875-125 MG tablet Take 1 tablet by mouth 2 (two) times daily. 16 tablet 0  . calcium carbonate (OSCAL) 1500 (600 Ca) MG TABS tablet Take 600 mg of elemental calcium by mouth daily with breakfast.    . nystatin (MYCOSTATIN) 100000 UNIT/ML suspension Use as directed 5 mLs (500,000 Units total) in the mouth or throat 4 (four) times daily. 60 mL 0  . pravastatin (PRAVACHOL) 10 MG tablet Take 10 mg by mouth daily.     No current facility-administered medications for this visit.    Facility-Administered Medications Ordered in Other Visits  Medication Dose Route Frequency Provider Last Rate Last Dose  . heparin lock flush 100 unit/mL  250 Units Intracatheter PRN Leia Alf, MD      . heparin lock flush 100 unit/mL  500 Units Intracatheter PRN Leia Alf, MD      . sodium chloride 0.9 % injection 10 mL  10 mL Intracatheter PRN Leia Alf, MD      . sodium chloride 0.9 % injection 10 mL  10 mL Intracatheter PRN Ma Hillock, Sandeep, MD      . sodium chloride 0.9 % injection 10 mL  10 mL Intracatheter PRN Ma Hillock, Sandeep, MD      . sodium chloride 0.9 % injection 10 mL  10 mL Intracatheter PRN Leia Alf, MD      -year-old  OBJECTIVE: Vitals:   11/19/17 1159  BP: 123/79  Pulse: (!) 109  Resp: 18  Temp: 98.5 F (36.9 C)     Body mass index is 20.31 kg/m.    ECOG FS:0 - Asymptomatic  General: Well-developed, well-nourished, no acute distress. Eyes: Pink conjunctiva, anicteric sclera.  Stye on left eyelid. HEENT: Lymphadenopathy in left neck no longer palpable. Lungs: Clear to auscultation bilaterally. Heart: Regular rate and rhythm. No rubs, murmurs, or gallops. Abdomen: Soft, nontender, nondistended. No organomegaly noted, normoactive bowel sounds. Musculoskeletal: No edema, cyanosis, or clubbing. Neuro: Alert, appears confused. Cranial nerves grossly intact. Skin: No rashes or petechiae noted. Psych: Anxious.   LAB RESULTS:  Lab Results  Component Value  Date   NA 139 11/15/2017   K 3.8 11/15/2017   CL 107 11/15/2017   CO2 21 (L) 11/15/2017   GLUCOSE 103 (H) 11/15/2017   BUN 22 (H) 11/15/2017   CREATININE 0.70 11/15/2017   CALCIUM 8.6 (L) 11/15/2017   PROT 6.5 11/15/2017   ALBUMIN 3.2 (L) 11/15/2017   AST 21 11/15/2017   ALT 21 11/15/2017   ALKPHOS 107 11/15/2017   BILITOT 0.6 11/15/2017   GFRNONAA >60 11/15/2017   GFRAA >60 11/15/2017    Lab Results  Component Value Date   WBC 14.3 (H) 11/17/2017   NEUTROABS 20.7 (H) 11/15/2017   HGB 11.9 (L) 11/17/2017   HCT 36.2 11/17/2017   MCV 96.0 11/17/2017   PLT 217 11/17/2017     STUDIES: Mr Jeri Cos GY Contrast  Result Date: 11/12/2017 CLINICAL DATA:  Recent memory loss and confusion. Chemo and radiation for neck and colon cancer. EXAM: MRI HEAD WITHOUT AND WITH CONTRAST TECHNIQUE: Multiplanar, multiecho pulse sequences of the brain and surrounding structures were obtained without and  with intravenous contrast. CONTRAST:  32mL MULTIHANCE GADOBENATE DIMEGLUMINE 529 MG/ML IV SOLN COMPARISON:  None. FINDINGS: The patient was unable to remain motionless for the exam. Small or subtle lesions could be overlooked. Brain: No evidence for acute infarction, hemorrhage, mass lesion, hydrocephalus, or extra-axial fluid. Generalized atrophy. Hypoattenuation of white matter, most consistent with small vessel disease. Post infusion, no abnormal enhancement of the brain or meninges. Vascular: Flow voids are maintained throughout the carotid, basilar, and vertebral arteries. There are no areas of chronic hemorrhage. Skull and upper cervical spine: Post XRT changes in the cervical region. No worrisome osseous lesion. Sinuses/Orbits: No significant sinus or mastoid fluid. Negative orbits. Other: RIGHT mastoid effusion.  No nasopharyngeal process. IMPRESSION: Atrophy and small vessel disease. No acute intracranial findings. No evidence for intracranial metastatic disease. Electronically Signed   By: Staci Righter M.D.   On: 11/12/2017 15:13   Nm Pet Image Restag (ps) Skull Base To Thigh  Result Date: 11/02/2017 CLINICAL DATA:  Subsequent treatment strategy for squamous cell carcinoma of the neck. Colon cancer. EXAM: NUCLEAR MEDICINE PET SKULL BASE TO THIGH TECHNIQUE: 6.2 mCi F-18 FDG was injected intravenously. Full-ring PET imaging was performed from the skull base to thigh after the radiotracer. CT data was obtained and used for attenuation correction and anatomic localization. Fasting blood glucose: 97 mg/dl Mediastinal blood pool activity: SUV max 2.4 COMPARISON:  06/22/2017 FINDINGS: NECK: The right level 1b mass measures 2.2 by 1.5 cm (formerly 2.9 by 2.3 cm) and has a maximum standard uptake value of 10.3 (formerly 16.1). The small lymph node interposed between the parotid and right submandibular glands previously measured 0.6 cm in short axis and currently measures 0.4 cm. This structure reportedly previously had a metabolic activity of 3.5, currently 1.5. As before, a discrete mucosal lesion is not seen. Incidental CT findings: Right mastoid effusion. Bilateral carotid atherosclerotic calcification. CHEST: No significant hypermetabolic activity in the chest. Incidental CT findings: Coronary, aortic arch, and branch vessel atherosclerotic vascular disease. Small pericardial effusion settled posteriorly. Trace pleural fluid bilaterally. Old granulomatous disease. Centrilobular emphysema. ABDOMEN/PELVIS: No significant abnormal hypermetabolic activity in the abdomen/pelvis. Incidental CT findings: Aortoiliac atherosclerotic vascular disease. Infrarenal abdominal aortic ectasia without overt aneurysm. SKELETON: No significant hypermetabolic activity. Incidental CT findings: none IMPRESSION: 1. Improved appearance with the right level 1b mass reduced in size and metabolic activity compared to the prior exam. There is still residual abnormal activity, with maximum SUV currently 10.3. No findings of additional  spread of disease. 2. Other imaging findings of potential clinical significance: Right mastoid effusion. Aortic Atherosclerosis (ICD10-I70.0) and Emphysema (ICD10-J43.9). Coronary atherosclerosis. Small pericardial effusion and trace bilateral pleural effusions. Electronically Signed   By: Van Clines M.D.   On: 11/02/2017 15:12   Dg Chest Portable 1 View  Result Date: 11/15/2017 CLINICAL DATA:  Shortness of breath.  History of colon cancer. EXAM: PORTABLE CHEST 1 VIEW COMPARISON:  PET scan of November 02, 2017. Prior radiographs are not available for comparison. FINDINGS: The heart size and mediastinal contours are within normal limits. No pneumothorax is noted. Atherosclerosis of thoracic aorta is noted. Emphysematous disease is noted throughout both lungs. Mild left basilar atelectasis or infiltrate is noted with minimal left pleural effusion. The visualized skeletal structures are unremarkable. IMPRESSION: Mild left basilar atelectasis or infiltrate is noted with minimal left pleural effusion. Aortic Atherosclerosis (ICD10-I70.0) and Emphysema (ICD10-J43.9). Electronically Signed   By: Marijo Conception, M.D.   On: 11/15/2017 20:29    ASSESSMENT: Stage IVa head  and neck squamous cell carcinoma, adenocarcinoma of the colon  PLAN:    1. Stage IVa head and neck squamous cell carcinoma: PET scan results from November 02, 2017 reviewed independently with significant improvement appearance compared to previous exam with minimal residual abnormal metabolic activity.  This is possibly related to inflammation from recent XRT.  No intervention is needed at this time.  Patient will require ENT follow-up in the near future and will repeat imaging in approximately 3 months.   2.  Adenocarcinoma of the colon: Given patient's decreased performance status, she will be reevaluated by surgery in approximately 3 weeks. CEA is within normal limits. PET scan as above. 3.  Fractured ankles: Continue follow-up and  treatment per orthopedics.  Continue physical therapy. 4.  Confusion, anxiety, depression: Improved.  Possibly related to underlying pneumonia.  MRI of the brain negative. 5.  Pneumonia: Continue current antibiotics as prescribed. 6.  Decreased performance status: Patient reports her appetite is improving.  Continue home health and physical therapy. 7.  Follow-up: Return to clinic in 4 weeks with repeat laboratory work and further evaluation.     [name], MD 11/19/17 8:33 PM

## 2017-11-19 ENCOUNTER — Inpatient Hospital Stay: Payer: PPO | Attending: Oncology | Admitting: Oncology

## 2017-11-19 ENCOUNTER — Other Ambulatory Visit: Payer: Self-pay | Admitting: *Deleted

## 2017-11-19 VITALS — BP 123/79 | HR 109 | Temp 98.5°F | Resp 18 | Wt 118.3 lb

## 2017-11-19 DIAGNOSIS — R5383 Other fatigue: Secondary | ICD-10-CM | POA: Insufficient documentation

## 2017-11-19 DIAGNOSIS — R410 Disorientation, unspecified: Secondary | ICD-10-CM | POA: Insufficient documentation

## 2017-11-19 DIAGNOSIS — Z87891 Personal history of nicotine dependence: Secondary | ICD-10-CM | POA: Insufficient documentation

## 2017-11-19 DIAGNOSIS — J189 Pneumonia, unspecified organism: Secondary | ICD-10-CM | POA: Diagnosis not present

## 2017-11-19 DIAGNOSIS — F419 Anxiety disorder, unspecified: Secondary | ICD-10-CM | POA: Diagnosis not present

## 2017-11-19 DIAGNOSIS — F329 Major depressive disorder, single episode, unspecified: Secondary | ICD-10-CM | POA: Diagnosis not present

## 2017-11-19 DIAGNOSIS — C760 Malignant neoplasm of head, face and neck: Secondary | ICD-10-CM | POA: Diagnosis not present

## 2017-11-19 DIAGNOSIS — R531 Weakness: Secondary | ICD-10-CM | POA: Diagnosis not present

## 2017-11-19 DIAGNOSIS — C189 Malignant neoplasm of colon, unspecified: Secondary | ICD-10-CM | POA: Diagnosis not present

## 2017-11-19 DIAGNOSIS — R413 Other amnesia: Secondary | ICD-10-CM | POA: Insufficient documentation

## 2017-11-19 NOTE — Patient Outreach (Signed)
[facility]) Care Management [place], Transition of Care day 1  11/19/2017  Rogan Ecklund [facility] [birth year] [id number]  Successful telephone outreach to [name], spouse/ caregiiver, on [facility] CM written consent, ofLouise Ikeisha, [age][place][age] y.o.femalereferred originally to [place] by insurance plan; screening call by [facility] telephonic RN CM revealed recent hospitalization December 21-24, 2018 for bilateral ankle fractures; patient was discharged to SNF for rehabilitation, and discharged to home from SNF / self-care with home health [facility]) services in place, on September 26, 2017. Patient referred to [place] by [facility] CSW for transition of care following SNF discharge on 09/26/17.Patient has history including, but not limited to, HTN/ HLD; colon cancer and head/ neck cancer.  Patient experienced hospital re-admission November 15, 2017 for HCAP, Acute respiratory Failure with hypoxia, dehydration.  Patient was discharged home to self-care with home health services in place through [facility] for RN and PT.  HIPAA/ identity verified with patient's spouse during phone call today.  Today,caregiverreports thatpatient"is doing okay, but is just not herself."  Reports ongoing concerns around "chemo cloud brain," which he reports has continued and "even gotten worse."  Caregiver reports that he has discussed with all patient's oncology providers, and that he is frustrated that this side effect is not improving.  Caregiver denies that patient has had new/ recent falls, or is in distress/ pain.  Reports that he transported patient to oncology appointment earlier today, and discussed his concerns with oncology provider, who advised him to "stay patient, everybody is different," in their individual response to chemotherapy.  Caregiver denies that patient is in distress or is having trouble breathing; states, "she seems to be fine with her breathing."  Caregiver  further reports:  -- has all medications and is taking as prescribed; confirms that he has obtained and is giving patient post-hospital discharge antibiotic and oral nystatin; verbalizes accurate understanding of dosing for both, and denies further changes to patient's medications post-hospital discharge; confirms that he continues preparing/ administering patient's medications for her  -- home health [facility]) nursing/ PT services re-ordered post-recent hospital discharge; confirms that he has phone number for [facility] agency ([place]) and that [facility] PT visited patient this morning.  Encouraged caregiver to encourage patient to continue active participation in [facility] services post-hospital discharge  -- patient continues to have intermittent ongoing (L) ankle swelling after bilateral ankle fractures from fall in December 2018; reports has follow up office visit with orthopedic provider "next week," on November 24, 2017; continues keeping feet elevated as "much as possible," denies that patient complains of pain.  Continues to deny new/ recent falls, and reports that he remains able to assist patient in ambulation while she uses her walker.  Again discussed options for caregiver resources/ support in ongoing daily care needs of patient.  -- again confirmed that caregiver has reviewed Advanced Directives (AD) planning packet independently-- caregiver reports that he has definitively decided not to notarize AD documents until patient's "Chemo cloud brain" improves; support provided, assured caregiver this was appropriate decision.  -- recent and upcoming provider appointments reviewed; husband reports that he will continue providing transportation to all provider appointments, again stating that he feels as if he is able to safely manage assisting patient in/ out of vehicle.  Reports upcoming provider appointments next week on November 24, 2017 to both patient's PCP and orthopedic provider.  Community Resource/  Caregiver support needs: -- patient's caregiver continues to deny additional community resources, again stating that  he has family that assist as indicated; however, caregiver continues to report that patient's ongoing lack of improvement with her "chemo cloud brain" is very frustrating for him; emotional support provided, and encouraged caregiver to consider caregiver support options, including private duty assistance if needed, using family members for assistance more frequently, etc; caregiver reports that he feels he "is the best one" to care for patient, and further adds that he "doesn't have any time" to explore options for caregiver support; caregiver does agree to further discussion later, as indicated around caregiver consideration.   Patient's caregiver deniesfurther issues, concerns, or problems today.I confirmed that patient/ spousehavemy direct phone number, the main [facility] CM office phone number, and the [facility] CM 24-hour nurse advice phone number should issues arise prior to next scheduled [place] outreachby phonenext week.  Encouraged patient/ spouseto contact me directly if needs, questions, issues, or concerns arise prior to next scheduled outreach and heagreed to do so.  Plan:  Patient will take medications as prescribed and will attend all scheduled provider appointments  Patient will actively participate in home health services as ordered post-hospital dischargeas long as they are active in patient's care  Patient willcontinueusingassistive devices for ambulation  Patient/ spouse will promptly notify patient's care providers for any new concerns, issues, or problems that arise  [facility] Community CM outreach for transition of care to continue with scheduledtelephone callnextweek  [facility] CM Care Plan Problem One     Most Recent Value  Care Plan Problem One  Risk for hospital readmission, related to/ as evidenced by recent hospitalization for HCAP March 31-  November 17, 2017  Role Documenting the Problem One  Care Management [place] for Problem One  Active  [facility] Long Term Goal   Over the next 31 days, patient will not experience hospital re-admission, as evidenced by patient/ caregiver reporting and review of EMR during [facility] RN CCM outreach  [facility] Long Term Goal Start Date  11/19/17  Interventions for Problem One Long Term Goal  Discussed with patient's caregiver hospital discharge instructions, patient's current clinical status, new medications post-hospital discharge,  [facility] CM TOC program initiated  [facility] CM Short Term Goal #1   Over the next 30 days, patient will actively participate in home health services as ordered post- hospital discharge, as evidenced by caregiver reporting and collaboration with home health team, as indicated, during [place] RN outreach  [facility] CM Short Term Goal #1 Start Date  11/19/17  Interventions for Short Term Goal #1  Discussed with patient's caregiver current home health services in place post-most recent hospital discharge, and confirmed that spouse has phone number for home health agency team and that he has heard from home health team and that a patient visit was completed today by home health team    [facility] CM Care Plan Problem Two     Most Recent Value  Care Plan Problem Two  High Risk for falls, as evidenced by recent fall requiring hospitalization and SNF placement  Role Documenting the Problem Two  Care Management Coordinator  Care Plan for Problem Two  Active  Interventions for Problem Two Long Term Goal   Discussed with patient's spouse patient's current mobility status and confirmed that patient has not experienced new or recent falls,  encouraged patient's active participation with home health PT and consistent use of assistive devices,  fall risk/ prevention education reinforced  [facility] Long Term Goal  Over the next 60 days, patient will use assistive devices as  prescribed post- SNF discharge, as evidenced by  patient/ caregiver reporting during [place] outreach  [place] Term Goal Start Date  11/19/17 [Goal re-established today]  [facility] CM Short Term Goal #1   Over the next 13 days, patient will not experience a fall, as evidenced by reporting of patient's spouse during [place] outreach  [facility] CM Short Term Goal #1 Start Date  11/11/17  [facility] CM Short Term Goal #1 Met Date   11/19/17 -Goal met    [facility] CM Care Plan Problem Three     Most Recent Value  Care Plan Problem Three  Need for advanced directive planning, as evidenced by patient/ spouse reporting of same  Role Documenting the Problem Three  Care Management Coordinator  Care Plan for Problem Three  Not Active  [facility] Long Term Goal   Over the next 38 days, patient and caregiver will review Advanced Directive planning packet and printed educational material, as evidenced by patient and caregiiver reporting during [place] outreach  [facility] Long Term Goal Start Date  10/20/17  [facility] Long Term Goal Met Date  11/19/17 -Goal met  Interventions for Problem Three Long Term Goal  Confirmed with caregiver that he has reviewed AD planning packet with patient, and that he wishes to hold off on creating AD's until patient's "chemo cloud brain" improves     Oneta Rack, RN, BSN, [employer] Coordinator [facility] Care Management  [phone]

## 2017-11-20 LAB — CULTURE, BLOOD (ROUTINE X 2)
CULTURE: NO GROWTH
Culture: NO GROWTH
Special Requests: ADEQUATE

## 2017-11-24 ENCOUNTER — Ambulatory Visit: Payer: Self-pay | Admitting: *Deleted

## 2017-11-24 DIAGNOSIS — J181 Lobar pneumonia, unspecified organism: Secondary | ICD-10-CM | POA: Diagnosis not present

## 2017-11-24 DIAGNOSIS — E7800 Pure hypercholesterolemia, unspecified: Secondary | ICD-10-CM | POA: Diagnosis not present

## 2017-11-24 DIAGNOSIS — S82852D Displaced trimalleolar fracture of left lower leg, subsequent encounter for closed fracture with routine healing: Secondary | ICD-10-CM | POA: Diagnosis not present

## 2017-11-24 DIAGNOSIS — I10 Essential (primary) hypertension: Secondary | ICD-10-CM | POA: Diagnosis not present

## 2017-11-24 DIAGNOSIS — B370 Candidal stomatitis: Secondary | ICD-10-CM | POA: Diagnosis not present

## 2017-11-25 ENCOUNTER — Encounter: Payer: Self-pay | Admitting: *Deleted

## 2017-11-25 ENCOUNTER — Other Ambulatory Visit: Payer: Self-pay | Admitting: *Deleted

## 2017-11-25 NOTE — Patient Outreach (Signed)
[facility]) Care Management [place], Transition of Care day 7  11/25/2017  Dana Perez [facility] May 15, 1941 [id number]  Successful telephone outreach to [name], spouse/ caregiiver, on [facility] CM written consent, ofLouise Perez, [age]oll[age] y.o.femalereferred originally to [place] by insurance plan; screening call by [facility] telephonic RN CM revealed recent hospitalization December 21-24, 2018 for bilateral ankle fractures; patient was discharged to SNF for rehabilitation, and discharged to home from SNF / self-care with home health [facility]) services in place, on September 26, 2017. Patient referred to [place] by [facility] CSW for transition of care following SNF discharge on 09/26/17.Patient has history including, but not limited to, HTN/ HLD; colon cancer and head/ neck cancer.  Patient experienced hospital re-admission November 15, 2017 for HCAP, Acute respiratory Failure with hypoxia, dehydration.  Patient was discharged home to self-care with home health services in place through [facility] for RN and PT.  HIPAA/ identity verified with patient's spouse during phone call today.  Today,caregiverreports thatpatient"seems to be somewhat better with the warmer weather." Caregiver denies that patient is in distress or is having trouble breathing; states, "she is breathing fine."  Denies that patient has had new/ recent falls, or is in distress/ pain. Reports ongoing concerns around "chemo cloud brain," but states that he believes her "outlook is better," and that she "is not as emotional as she was a week ago."  Caregiver further reports:  -- No concerns/ issues or problems around patient's medications; states today "is last day" of antibiotic post- most recent hospital discharge; reports visited with patient's PCP on 11/24/17 who added previously prescribed "oral lozenges" for thrush, which caregiver has obtained and is giving patient, and "is helping."   Denies addtional changes to patient's medications and confirms that he continues preparing/ administering patient's medications for her  --home health (HH)nursing/ PTservicescontinue; patient actively participating; reports nurse and PT visits this week have occurred, and reports that he believes that patient "is walking better" since the weather has warmed up; states that patient has been able to complete exercises independently from [facility] PT sessions with his ongoing supervision/ encouragement.  --patient continues to have intermittent ongoing (L) ankle swelling after bilateral ankle fractures from fall in December 2018; reports attended orthopedic provider office visit on November 24, 2017 at which time patient was released from orthopedic follow up; continues keeping feet elevated as "much as possible," denies that patient complains of pain regularly, states, "sometimes she has a little soreness, but tylenol takes it right away."  Continues to deny new/ recent falls, and reports that he remains able to assist patient in ambulation and in/ out of vehicle while she uses her walker.    -- recent and upcoming provider appointments reviewed;husband reports that he will continue providing transportation to all provider appointments.  Noted office visit with oncology surgeon next week 12/02/17; reports next PCP office visit scheduled 12/31/17, at which time patient is to have post-hospital discharge/ post-antibiotic therapy CXR prior to PCP appointment  Community Resource/ Caregiver support needs: -- Again discussed with caregiver possibility of placing [facility] CSW referral to address patient's/ caregiver need for support; caregiver again denies additional community resource needs, again confirming that he has extended family members that assist as indicated; discussed with caregiver that if he would change his mind that [facility] CSW referral can be placed.  Patient's caregiver deniesfurther issues, concerns, or  problems today.I confirmed that patient/ spousehavemy direct phone number, the main [facility] CM office phone number,  and the [facility] CM 24-hour nurse advice phone number should issues arise prior to next scheduled [place] home visitnext week.Encouraged patient/ spouseto contact me directly if needs, questions, issues, or concerns arise prior to next scheduled outreach and heagreed to do so.  Plan:  Patient will take medications as prescribed and will attend all scheduled provider appointments  Patient will actively participate in home health services as ordered post-hospital dischargeas long as they are active in patient's care  Patient willcontinueusingassistive devices for ambulation  Patient/ spouse will promptly notify patient's care providers for any new concerns, issues, or problems that arise  [facility] CM outreach for transition of care to continue with scheduledhome visitnextweek  [facility] CM Care Plan Problem One     Most Recent Value  Care Plan Problem One  Risk for hospital readmission, related to/ as evidenced by recent hospitalization for HCAP March 31- November 17, 2017  Role Documenting the Problem One  Care Management [place] for Problem One  Active  [facility] Long Term Goal   Over the next 31 days, patient will not experience hospital re-admission, as evidenced by patient/ caregiver reporting and review of EMR during [facility] RN CCM outreach  [facility] Long Term Goal Start Date  11/19/17  Interventions for Problem One Long Term Goal  Confirmed with patient's caregiver/ spouse that patient attended hospital follow up PCP office visit yesterday,  discussed patient's medications with caregiver,  discussed patient's current clinical status with caregiver,  scheduled [facility] CCM RN home visit with caregiver for next week  [facility] CM Short Term Goal #1   Over the next 30 days, patient will actively participate in home health services as ordered post- hospital  discharge, as evidenced by caregiver reporting and collaboration with home health team, as indicated, during [facility] outreach  [facility] CM Short Term Goal #1 Start Date  11/19/17  Interventions for Short Term Goal #1  Discussed with patient's spouse/ caregiver current HH sevrices in place, recent [facility] visits, patient's progress around [facility] PT sessions    [facility] CM Care Plan Problem Two     Most Recent Value  Care Plan Problem Two  High Risk for falls, as evidenced by recent fall requiring hospitalization and SNF placement  Role Documenting the Problem Two  Care Management Coordinator  Care Plan for Problem Two  Active  Interventions for Problem Two Long Term Goal   Discussed patient's progress with [facility] PT sessions,  her use of assistive devices with ambulation,  confirmed that patient has not experienced new/ recent falls post-hospital discharge  [facility] Long Term Goal  Over the next 60 days, patient will use assistive devices as prescribed post- SNF discharge, as evidenced by patient/ caregiver reporting during [facility] RN CM outreach  [facility] Long Term Goal Start Date  11/19/17 [Goal re-established today]     Oneta Rack, RN, BSN, [place] Coordinator [facility] Care Management  [phone]

## 2017-11-27 DIAGNOSIS — E119 Type 2 diabetes mellitus without complications: Secondary | ICD-10-CM | POA: Diagnosis not present

## 2017-11-27 DIAGNOSIS — S82832D Other fracture of upper and lower end of left fibula, subsequent encounter for closed fracture with routine healing: Secondary | ICD-10-CM | POA: Diagnosis not present

## 2017-11-27 DIAGNOSIS — M1990 Unspecified osteoarthritis, unspecified site: Secondary | ICD-10-CM | POA: Diagnosis not present

## 2017-11-27 DIAGNOSIS — S82891D Other fracture of right lower leg, subsequent encounter for closed fracture with routine healing: Secondary | ICD-10-CM | POA: Diagnosis not present

## 2017-11-27 DIAGNOSIS — C189 Malignant neoplasm of colon, unspecified: Secondary | ICD-10-CM | POA: Diagnosis not present

## 2017-11-27 DIAGNOSIS — C4442 Squamous cell carcinoma of skin of scalp and neck: Secondary | ICD-10-CM | POA: Diagnosis not present

## 2017-11-27 DIAGNOSIS — I10 Essential (primary) hypertension: Secondary | ICD-10-CM | POA: Diagnosis not present

## 2017-11-27 DIAGNOSIS — K5900 Constipation, unspecified: Secondary | ICD-10-CM | POA: Diagnosis not present

## 2017-11-27 DIAGNOSIS — J189 Pneumonia, unspecified organism: Secondary | ICD-10-CM | POA: Diagnosis not present

## 2017-11-27 DIAGNOSIS — M810 Age-related osteoporosis without current pathological fracture: Secondary | ICD-10-CM | POA: Diagnosis not present

## 2017-11-27 DIAGNOSIS — E785 Hyperlipidemia, unspecified: Secondary | ICD-10-CM | POA: Diagnosis not present

## 2017-11-27 DIAGNOSIS — M8580 Other specified disorders of bone density and structure, unspecified site: Secondary | ICD-10-CM | POA: Diagnosis not present

## 2017-11-27 DIAGNOSIS — J440 Chronic obstructive pulmonary disease with acute lower respiratory infection: Secondary | ICD-10-CM | POA: Diagnosis not present

## 2017-11-30 ENCOUNTER — Ambulatory Visit
Admission: RE | Admit: 2017-11-30 | Discharge: 2017-11-30 | Disposition: A | Discharge status: Home / Self Care | Payer: PPO | Source: Ambulatory Visit | Attending: Radiation Oncology | Admitting: Radiation Oncology

## 2017-11-30 ENCOUNTER — Encounter: Payer: Self-pay | Admitting: Radiation Oncology

## 2017-11-30 ENCOUNTER — Other Ambulatory Visit: Payer: Self-pay

## 2017-11-30 VITALS — BP 115/62 | Temp 97.6°F | Resp 18

## 2017-11-30 DIAGNOSIS — Z923 Personal history of irradiation: Secondary | ICD-10-CM | POA: Insufficient documentation

## 2017-11-30 DIAGNOSIS — C760 Malignant neoplasm of head, face and neck: Secondary | ICD-10-CM | POA: Insufficient documentation

## 2017-11-30 DIAGNOSIS — Z85038 Personal history of other malignant neoplasm of large intestine: Secondary | ICD-10-CM | POA: Insufficient documentation

## 2017-11-30 DIAGNOSIS — F418 Other specified anxiety disorders: Secondary | ICD-10-CM | POA: Insufficient documentation

## 2017-11-30 DIAGNOSIS — R410 Disorientation, unspecified: Secondary | ICD-10-CM | POA: Diagnosis not present

## 2017-11-30 DIAGNOSIS — Z9221 Personal history of antineoplastic chemotherapy: Secondary | ICD-10-CM | POA: Diagnosis not present

## 2017-11-30 NOTE — Progress Notes (Signed)
Radiation Oncology Follow up Note  Name: Dana Perez   Date:   11/30/2017 MRN:  [id number] DOB: [birth year]    This 77 y.o. 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 She is seen today in routine follow-up and is doing poorly. She continues to have persistent mass in the right neck submandibular region..she had a repeat PET CT scan back in 11/02/2017 showing excellent response although still residual hypermetabolic activity in the right sub-digastric lesion. She continues to have some dysphasia and significant head and neck pain of unknown etiology.patient does also have known history of adenocarcinoma the colon is scheduled to see surgeon in approximately 3 weeks. She also has significant confusion anxiety and depression. Recent brain MRI scan only showed atrophy.  COMPLICATIONS OF TREATMENT: none  FOLLOW UP COMPLIANCE: keeps appointments   PHYSICAL EXAM:  BP 115/62   Temp 97.6 F (36.4 C)   Resp 66  Frail-appearing female in NAD oral cavity is clear no oral mucosal lesions are identified indirect mirror examination shows upper airway clear vallecula and base of tongue within normal limits. She has a protruding mass which I assume since her PET CT scan has grown to approximately 2 cm. She is exquisite tender in the head and neck region bilaterally. Well-developed well-nourished patient in NAD. HEENT reveals PERLA, EOMI, discs not visualized.  Oral cavity is clear. No oral mucosal lesions are identified. Neck is clear without evidence of cervical or supraclavicular  adenopathy. Lungs are clear to A&P. Cardiac examination is essentially unremarkable with regular rate and rhythm without murmur rub or thrill. Abdomen is benign with no organomegaly or masses noted. Motor sensory and DTR levels are equal and symmetric in the upper and lower extremities. Cranial nerves II through XII are grossly intact. Proprioception is intact. No peripheral adenopathy or edema is identified. No motor or sensory levels are noted. Crude visual fields are within normal range.  RADIOLOGY RESULTS: PET CT scan is reviewed and compatible with the above-stated findings  PLAN: at this time I'm referring the patient back to ENT for possible resection of the residual disease in her right neck. She is in poor performance status which would make this possibly inoperable. Will let ENT make that decision she may need care at a tertiary center for this type of surgery. I've asked to see her back in 3 months for follow-up.  I would like to take this opportunity to thank you for allowing me to participate in the care of your patient.Noreene Filbert, MD

## 2017-12-02 ENCOUNTER — Encounter: Payer: Self-pay | Admitting: Surgery

## 2017-12-02 ENCOUNTER — Ambulatory Visit (INDEPENDENT_AMBULATORY_CARE_PROVIDER_SITE_OTHER): Payer: PPO | Admitting: Surgery

## 2017-12-02 VITALS — BP 151/73 | HR 97 | Temp 97.4°F | Ht 64.0 in | Wt 116.8 lb

## 2017-12-02 DIAGNOSIS — Z09 Encounter for follow-up examination after completed treatment for conditions other than malignant neoplasm: Secondary | ICD-10-CM | POA: Diagnosis not present

## 2017-12-02 NOTE — Patient Instructions (Signed)
Please see your follow up appointment listed below.  °

## 2017-12-02 NOTE — Progress Notes (Signed)
Ms. Dana Perez is a 77 year old female with a history of metastatic head and neck cancer status post radiation and chemotherapy.  He also has a concomitant right colon cancer found incidentally during a colonoscopy. More recently both her neurological condition as well as her performance status have worsening significantly.  She now has an increase on her right neck mass. Current efforts have been done to establish whether or not this is an early recurrence and ENT consultation is pending. She is not showing any signs of obstruction.  She is taking diet. Had a lengthy discussion with the patient and her husband.  I am concerned about the recent deterioration on her overall health and performance status.  I do not think that a major operation should be scheduled due to the significant risk for morbidity and mortality.  I will see her back in about 3 weeks and at that time we will determine the clinical situation.  No need for any emergent or urgent surgical intervention at this time. I spent 25 minutes this encounter with greater than 50% spent in counseling and coordination of care

## 2017-12-03 ENCOUNTER — Encounter: Payer: Self-pay | Admitting: *Deleted

## 2017-12-03 ENCOUNTER — Other Ambulatory Visit: Payer: Self-pay | Admitting: *Deleted

## 2017-12-04 NOTE — Patient Outreach (Signed)
[facility]) Care Management  [place] Routine Home Visit 12/04/2017  Dana Perez [facility] Jul 10, 1941 [id number]  Dana Perez is a 77 y.o. female referred originally to [place] by insurance plan; screening call by [facility] telephonic RN CM revealed recent hospitalization December 21-24, 2018 for bilateral ankle fractures; patient was discharged to SNF for rehabilitation, and discharged to home from SNF / self-care with home health [facility]) services in place, on September 26, 2017. Patient referred to [place] by [facility] CSW for transition of care following SNF discharge on 09/26/17.Patient has history including, but not limited to, HTN/ HLD; colon cancer and head/ neck cancer.  Patientexperienced hospital re-admissionMarch 31, 2019 for HCAP, Acute respiratory Failure with hypoxia, dehydration.Patient was discharged home to self-care with home health services in place through [place] [facility] for RN and PT.HIPAA/ identity verified with patient in person during home visit today, and patient's spouse is present in today's home visit and actively participates in all aspects of home visit.  Pleasant 45 minute visit.  Subjective:  "Warmer weather is on the way, and I want to be able to get out in it and enjoy it."  Assessment:  Dana Perez appears to be recuperating well after her recent hospital visit for HCAP, and she denies pain and concerns around her ongoing care.  Dana Perez has continued to make progress with increasing her ambulation and has fully participated in home health services as ordered post-SNF discharge, and she has not experienced any new or recent falls.  Dana Perez has a very supportive and actively involved spouse, who is her primary caregiver and she and caregiver both continue to deny community resource needs.  Dana Perez and her spouse would still like to create Advanced Directives for HCPOA and Living Will, but have deferred this for now, as patient continues to  experience side-effects around chemotherapy, which clouds her thought processes.  Dana Perez remains a high fall risk.  Today,patient and caregiverreport thatpatient"seems to be doing a little better," and patient denies pain today; patient is in no obvious distress and denies recent issues/ concerns around breathing status post-HCAP hospital visit.  Reports that patient has developed "new swelling" in (R) lower jaw that "started out soft, and then got hard."  Reports that patient's caregiver immediately notified patient's oncology providers and attended follow up provider visit to evaluate new swelling.  Reports ongoing concerns around "chemo-cloud brain," which both patient and caregiver believe "may be a little better than it was."  Patient/ Caregiver further report:  -- No concerns/ issues or problems around patient's medications; confirms completed antibiotic post- most recent hospital discharge;  Denies addtional changes to patient's medications and confirms that caregiver continues preparing/ administering patient's medications for her.  Denies issues with swallowing medications, and caregiver is able to verbalize a good general understanding of the purpose, dosing, and scheduling of patient's medications.  Patient was recently discharged from hospital and all medications were thoroughly reviewed with caregiver today.  --home health (HH)nursing/ PTservicescontinue; patient actively participating; reports PT visit this week, with nurse scheduled to come later this afternoon.  Continue to report that patient "is walking better," and denies new/ recent falls.  Reports patient continues to complete exercises independently from [facility] PT sessions with his ongoing supervision/ encouragement by caregiver.  --patient continues tohave intermittent ongoing(L) ankle swelling after bilateral ankle fractures from fall in December 2018; this is much improved today, and patient is noted only to have minimal  anterior bilateral foot puffiness-- no overt swelling noted.  Reports has been "released" from orthopedic provider. Continues to deny new/ recent falls, and reports that caregiver remains able to assist patient in ambulation and in/ out of vehicle while she uses her walker. Previously provided fall risks/ prevention education provided/ discussed with patient and caregiver today; patient does not ambulate in her home during home visit today, but is able to sit up straight and engage in home visit.  Caregiver reports he has gait belt to assist in patient's ambulation, and her walker is within reach of her.  No obvious fall hazards noted in patient's home environment today.  --recent and upcomingprovider appointments reviewed;husband/ caregiver reports that overall plan for evaluation of new jaw swelling is to follow up with oncology ENT surgery provider and oncology chemotherapy provider-- both appointments scheduled.  Community Resource/ Caregiver support needs: -- Again discussed with patient and caregiver possibility of placing [facility] CSW referral to address patient's/ caregiver need for support, possibility of ADL assistance for patient; patient and caregiver again deny additional community resource needs, again stating that they have "5-7 family members" that rotate providing care for patient as indicated; again discussed with patient and caregiver that [facility] CSW referral can be placed in future if needed.  Patient's caregiver deniesfurther issues, concerns, or problems today.I confirmed that patient/ spousehavemy direct phone number, the main [facility] CM office phone number, and the [facility] CM 24-hour nurse advice phone number should issues arise prior to next scheduled [place] telephone callnextweek after next oncology provider visit.Encouraged patient/ spouseto contact me directly if needs, questions, issues, or concerns arise prior to next scheduled outreach and heagreed to  do so.  Objective:    BP 126/64   Pulse 86   Resp 18   SpO2 95%   Review of Systems  Constitutional: Positive for malaise/fatigue.  Respiratory: Negative.  Negative for cough, shortness of breath and wheezing.   Cardiovascular: Negative.  Negative for chest pain and leg swelling.  Gastrointestinal: Negative.  Negative for abdominal pain and nausea.  Genitourinary: Negative.   Musculoskeletal: Negative for falls.  Neurological: Negative for dizziness.       Ongoing reports from patient and caregiver re: "chemo cloud brain," reports this is "maybe getting a little better"  Psychiatric/Behavioral: Negative for depression. The patient is not nervous/anxious.    Physical Exam  Constitutional: She is oriented to person, place, and time. She appears well-developed and well-nourished. No distress.    Cardiovascular: Normal rate, regular rhythm, normal heart sounds and intact distal pulses.  Pulses:      Radial pulses are 2+ on the right side, and 2+ on the left side.       Dorsalis pedis pulses are 2+ on the right side, and 2+ on the left side.  Respiratory: Effort normal. No respiratory distress. She has no wheezes. She has no rales.  GI: Soft. Bowel sounds are normal.  Musculoskeletal: She exhibits no edema.  Slight/minimal anterior bilateral foot puffiness, without overt swelling  Neurological: She is alert and oriented to person, place, and time.  Skin: Skin is warm and dry. No erythema.  Psychiatric: She has a normal mood and affect. Her behavior is normal. Judgment and thought content normal.   Encounter Medications:   Outpatient Encounter Medications as of 12/03/2017  Medication Sig Note  . acetaminophen (TYLENOL) 500 MG tablet Take 500 mg by mouth every 6 (six) hours as needed. 10/20/2017: Uses prn   . amLODipine (NORVASC) 5 MG tablet Take 5 mg by mouth daily.    Marland Kitchen  calcium carbonate (OSCAL) 1500 (600 Ca) MG TABS tablet Take 600 mg of elemental calcium by mouth daily with  breakfast.   . clotrimazole (MYCELEX) 10 MG troche TAKE 1 TABLET BY MOUTH 5 (FIVE) TIMES DAILY DISSOLVE TABLET SLOWLY AND COMPLETELY IN MOUTH.   . nystatin (MYCOSTATIN) 100000 UNIT/ML suspension Use as directed 5 mLs (500,000 Units total) in the mouth or throat 4 (four) times daily.   . pravastatin (PRAVACHOL) 10 MG tablet Take 10 mg by mouth daily.    Facility-Administered Encounter Medications as of 12/03/2017  Medication  . heparin lock flush 100 unit/mL  . heparin lock flush 100 unit/mL  . sodium chloride 0.9 % injection 10 mL  . sodium chloride 0.9 % injection 10 mL  . sodium chloride 0.9 % injection 10 mL  . sodium chloride 0.9 % injection 10 mL   Functional Status:   In your present state of health, do you have any difficulty performing the following activities: 12/03/2017 11/15/2017  Hearing? N N  Comment - -  Vision? N N  Comment - -  Difficulty concentrating or making decisions? Y Y  Comment - -  Walking or climbing stairs? Y Y  Comment - -  Dressing or bathing? Y Y  Comment - -  Doing errands, shopping? [place] and eating ? Y -  Comment - -  Using the Toilet? Y -  Comment - -  In the past six months, have you accidently leaked urine? Y -  Comment - -  Do you have problems with loss of bowel control? N -  Comment - -  Managing your Medications? Y -  Comment - -  Managing your Finances? Y -  [place] or managing your Housekeeping? Y -  Comment - -  Some recent data might be hidden   Fall/Depression Screening:    Fall Risk  12/03/2017 11/25/2017 11/19/2017  Falls in the past year? Yes (No Data) Yes  Comment No new/ recent falls No new/ recent falls, per report of patient's spouse, on [facility] CM written consent per spouse/ caregiver on [facility] CM written consent, no new/ recent falls  Number falls in past yr: - - 1  Comment - - per spouse/ caregiver on [facility] CM written consent  Injury with Fall? - - Yes  Comment - - per spouse/  caregiver on [facility] CM written consent  Risk Factor Category  - - High Fall Risk  Risk for fall due to : Impaired balance/gait;Impaired mobility;History of fall(s) - History of fall(s);Impaired mobility;Impaired balance/gait  Risk for fall due to: Comment - - -  Follow up Falls prevention discussed Falls prevention discussed Falls prevention discussed  Comment - - with spouse/ caregiver on [facility] CM written consent   PHQ 2/9 Scores 12/03/2017 11/05/2017 10/20/2017 07/06/2017  PHQ - 2 Score 1 0 0 0   Plan:  Patient will take medications as prescribed and will attend all scheduled provider appointments  Patient will actively participate in home health services as ordered post-hospital dischargeas long as they are active in patient's care  Patient willcontinueusingassistive devices for ambulation  Patient/ spouse will promptly notify patient's care providers for any new concerns, issues, or problems that arise  I will share today's notes/ care plan with patient's PCP  [facility] CM outreachto continue with scheduledtelephone callnextweek   [facility] CM Care Plan Problem One     Most Recent Value  Care Plan Problem One  Risk for hospital readmission, related to/ as evidenced by recent hospitalization for HCAP March 31- November 17, 2017  Role Documenting the Problem One  Care Management [place] for Problem One  Active  [facility] Long Term Goal   Over the next 31 days, patient will not experience hospital re-admission, as evidenced by patient/ caregiver reporting and review of EMR during [facility] RN CCM outreach  [place] Term Goal Start Date  11/19/17  Interventions for Problem One Long Term Goal  Routine home visit completed,  medication review completed,  discussed with caregiver and patient recent and upcoming provider appointments, and confirmed that patient's husband will continue providing transportation to appointments  [facility] CM Short Term Goal #1   Over the next 30 days, patient  will actively participate in home health services as ordered post- hospital discharge, as evidenced by caregiver reporting and collaboration with home health team, as indicated, during [place] outreach  [facility] CM Short Term Goal #1 Start Date  11/19/17  Interventions for Short Term Goal #1  Discussed with patient and caregiver current [facility] services in place,  encouraged patient's ongoing active participation in [place] Problem Two     Most Recent Value  Care Plan Problem Two  High Risk for falls, as evidenced by recent fall requiring hospitalization and SNF placement  Role Documenting the Problem Two  Care Management Coordinator  Care Plan for Problem Two  Active  Interventions for Problem Two Long Term Goal   Discussed patient's progress in ambulation,  reiterated fall prevention education,  confirmed that patient continues using assistive devices for ambulation  [facility] Long Term Goal  Over the next 60 days, patient will use assistive devices as prescribed post- SNF discharge, as evidenced by patient/ caregiver reporting during [facility] RN CM outreach  [facility] Long Term Goal Start Date  11/19/17 [Goal re-established today]     Oneta Rack, RN, BSN, [address] Coordinator [facility] Care Management  [phone]

## 2017-12-10 ENCOUNTER — Ambulatory Visit: Payer: Self-pay | Admitting: Radiation Oncology

## 2017-12-10 DIAGNOSIS — C490 Malignant neoplasm of connective and soft tissue of head, face and neck: Secondary | ICD-10-CM | POA: Diagnosis not present

## 2017-12-11 ENCOUNTER — Other Ambulatory Visit: Payer: Self-pay | Admitting: *Deleted

## 2017-12-11 ENCOUNTER — Encounter: Payer: Self-pay | Admitting: *Deleted

## 2017-12-11 NOTE — Patient Outreach (Addendum)
[facility]) Care Management [place] Telephone Outreach Post-hospital discharge day 24  12/11/2017  [place] 1941/02/24 [id number]  Successful telephone outreach to Vivianne Master, spouse/ caregiver, on [facility] CM written consent, for ASHLING ROANE, 77 y.o. female referred originally to [facility] CM by insurance plan; screening call by [facility] telephonic RN CM revealed hospitalization December 21-24, 2018 for bilateral ankle fractures; patient was discharged to SNF for rehabilitation, and discharged to home from SNF / self-care with home health [facility]) services in place, on September 26, 2017. Patient referred to [place] by [facility] CSW for transition of care following SNF discharge on 09/26/17.Patient has history including, but not limited to, HTN/ HLD; colon cancer and head/ neck cancer.  Patientexperienced hospital re-admissionMarch 31- November 17, 2017 for HCAP, Acute respiratory Failure with hypoxia, dehydration.Patient was discharged home to self-care with home health services in place through [employer] for RN and PT.HIPAA/ identity verified with patient's spouse during phone call today.   Today,patient's caregiverreports thatpatient"is about the same," and states that "her jaw just keeps getting bigger," re: previously reported (new) jaw enlargement.  Reports that patient "doesn't complain about being pain," and that this swelling has not impacted her ability to chew/ eat.  Reports patient is not in apparent distress.  Caregiver further reports:  -- No concerns/ issues/ questions or problems around patient's medications; Deniesnew/ recentchanges to patient's medications and confirms that he continues preparing/ administering patient's medications for her.  Denies issues with swallowing medications, given newly reported jaw swelling  --home health (HH)nursing/ PTservicescontinue; patient continues actively participating; reports PT and RN  visited patient this week on Monday, with expected visits later today as well.  Continues to report that patient "is walking better," and denies new/ recent falls.  Reports patient continues to complete exercises independently from [facility] PT sessions with his ongoing supervision/ encouragement by caregiver.  --recent and upcomingprovider appointments reviewed;husband/ caregiver confirms that patient attended oncology ENT surgery provider office visit yesterday, and was told that the oncologist felt that patient's new jaw swelling "is significant and urgent" and will require "specialist in [facility]" to complete evaluation around possibility of surgical intervention; reports 'waiting" to hear back from referral that was placed yesterday.  Caregiver continues to report that he will provide transportation to all of patient's upcoming provider appointments and that he feels confident in his ability to assist patient in and out of vehicle.  Community Resource/ Caregiver support needs: --Discussed with patient's caregiver [facility] program as a possibility for ongoing community resource for patient and caregiver; caregiver denies needing this resource at this time, but states that he may be more interested after patient sees Psychologist, sport and exercise in [place].  Continues to report that he has supportive family network of 4-5 family members that asisst in patient's care needs as indicated.  Patient's caregiver deniesfurther issues, concerns, or problems today.I confirmed that patient/ spousehavemy direct phone number, the main [facility] CM office phone number, and the [facility] CM 24-hour nurse advice phone number should issues arise prior to next scheduled [place] telephone callin 10 days.Encouraged patient/ spouseto contact me or [facility] CM main office directly if needs, questions, issues, or concerns arise prior to next scheduled outreach and heagreed to do  so.  Plan:  Patient will take medications as prescribed and will attend all scheduled provider appointments  Patient will actively participate in home health services as ordered post-hospital dischargeas long as they are active in patient's  care  Patient willcontinueusingassistive devices for ambulation  Patient/ spouse will promptly notify patient's care providers for any new concerns, issues, or problems that arise  [facility] CM outreachto continue with scheduledtelephone callin 10 days  [facility] CM Care Plan Problem One     Most Recent Value  Care Plan Problem One  Risk for hospital readmission, related to/ as evidenced by recent hospitalization for HCAP March 31- November 17, 2017  Role Documenting the Problem One  Care Management [place] for Problem One  Active  [facility] Long Term Goal   Over the next 31 days, patient will not experience hospital re-admission, as evidenced by patient/ caregiver reporting and review of EMR during [facility] RN CCM outreach  [facility] Long Term Goal Start Date  11/19/17  Interventions for Problem One Long Term Goal  Discussed with caregiver patient's current clinical status, recent provider appointments, and confirmed that spouse will continue providing transportation to provider appointments,  provided information to caregiver on [facility] program for his consideration in the future  [facility] CM Short Term Goal #1   Over the next 30 days, patient will actively participate in home health services as ordered post- hospital discharge, as evidenced by caregiver reporting and collaboration with home health team, as indicated, during [place] outreach  [facility] CM Short Term Goal #1 Start Date  11/19/17  Interventions for Short Term Goal #1  Discussed recent and upcoming home health visits with spouse    [facility] CM Care Plan Problem Two     Most Recent Value  Care Plan Problem Two  High Risk for falls, as evidenced by recent fall requiring  hospitalization and SNF placement  Role Documenting the Problem Two  Care Management Coordinator  Care Plan for Problem Two  Active  Interventions for Problem Two Long Term Goal   Confirmed that patient hasexperienced no new/ recent falls and is continuing to use assistive devices,  confirmed that patient's spouse continues to feel confident in managing patient's ambulation at home  [facility] Long Term Goal  Over the next 60 days, patient will use assistive devices as prescribed post- SNF discharge, as evidenced by patient/ caregiver reporting during [facility] RN CM outreach  [facility] Long Term Goal Start Date  11/19/17 [Goal re-established today]     Oneta Rack, RN, BSN, [place] Coordinator [facility] Care Management  [phone]

## 2017-12-13 NOTE — Progress Notes (Deleted)
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  INTERVAL HISTORY: Patient returns to clinic today for further evaluation and discussion of her MRI results.  She is admitted to the hospital over the weekend and diagnosed with pneumonia.  She feels improved since admission.  She continues to have significant weakness and fatigue, but does not appear as confused as she has been in recent weeks.  She reports an improved appetite.  She has no focal neurologic complaints. She has no chest pain or shortness of breath. She denies any nausea, vomiting, constipation, or diarrhea.  She has no melena or hematochezia.  She has no urinary complaints. Patient offers no further specific complaints today.  REVIEW OF SYSTEMS:   Review of Systems  Constitutional: Positive for malaise/fatigue. Negative for fever and weight loss.  HENT: Negative.  Negative for sore throat.   Respiratory: Negative.  Negative for cough and shortness of breath.   Cardiovascular: Negative.  Negative for chest pain and leg swelling.  Gastrointestinal: Negative.  Negative for abdominal pain, constipation, nausea and vomiting.  Genitourinary: Negative.   Musculoskeletal: Positive for joint pain. Negative for falls.  Skin: Negative.  Negative for rash.  Neurological: Positive for weakness. Negative for sensory change, focal weakness and headaches.  Psychiatric/Behavioral: Positive for memory loss. Negative for depression. The patient is nervous/anxious. The patient does not have insomnia.     As per HPI. Otherwise, a complete review of systems is negative.  PAST MEDICAL HISTORY: Past Medical History:    Diagnosis Date  . Adenocarcinoma of colon ([place])   . Benign neoplasm of ascending colon   . Essential hypertension 06/13/2014  . Goals of care, counseling/discussion 07/14/2017  . HLD (hyperlipidemia) 06/13/2014  . Hypercholesteremia   . Hyperlipidemia   . Multiple thyroid nodules 01/22/2012  . Osteoporosis   . Polycythemia, secondary 12/26/2014  . Pure hypercholesterolemia 03/15/2015  . Thyroid nodule     PAST SURGICAL HISTORY: Past Surgical History:  Procedure Laterality Date  . ABDOMINAL HYSTERECTOMY    . COLONOSCOPY WITH PROPOFOL N/A 07/02/2017   Procedure: COLONOSCOPY WITH PROPOFOL;  Surgeon: Lucilla Lame, MD;  Location: [place];  Service: Endoscopy;  Laterality: N/A;  . ORIF ANKLE FRACTURE Left 08/08/2017   Procedure: OPEN REDUCTION INTERNAL FIXATION (ORIF) ANKLE FRACTURE;  Surgeon: Dereck Leep, MD;  Location: [facility];  Service: Orthopedics;  Laterality: Left;  . POLYPECTOMY N/A 07/02/2017   Procedure: POLYPECTOMY;  Surgeon: Lucilla Lame, MD;  Location: [facility];  Service: Endoscopy;  Laterality: N/A;  . WRIST FRACTURE SURGERY  08/2009   badly broken    FAMILY HISTORY Family History  Problem Relation Age of Onset  . Heart disease Mother   . Diabetes Father        ADVANCED DIRECTIVES:    HEALTH MAINTENANCE: Social History   Tobacco Use  . Smoking status: Former Smoker    Packs/day: 0.25    Types: Cigarettes    Last attempt to quit: 06/17/2017    Years since quitting: 0.4  . Smokeless tobacco: Never Used  Substance Use Topics  . Alcohol use: No  . Drug use: No     Allergies  Allergen Reactions  . Other Other (See  Comments) and Rash    TDAP  . Tetanus Antitoxin Rash    Current Outpatient Medications  Medication Sig Dispense Refill  . acetaminophen (TYLENOL) 500 MG tablet Take 500 mg by mouth every 6 (six) hours as needed.    Marland Kitchen amLODipine (NORVASC) 5 MG tablet Take 5 mg by mouth daily.     . calcium carbonate (OSCAL) 1500  (600 Ca) MG TABS tablet Take 600 mg of elemental calcium by mouth daily with breakfast.    . clotrimazole (MYCELEX) 10 MG troche TAKE 1 TABLET BY MOUTH 5 (FIVE) TIMES DAILY DISSOLVE TABLET SLOWLY AND COMPLETELY IN MOUTH.  0  . nystatin (MYCOSTATIN) 100000 UNIT/ML suspension Use as directed 5 mLs (500,000 Units total) in the mouth or throat 4 (four) times daily. 60 mL 0  . pravastatin (PRAVACHOL) 10 MG tablet Take 10 mg by mouth daily.     No current facility-administered medications for this visit.    Facility-Administered Medications Ordered in Other Visits  Medication Dose Route Frequency Provider Last Rate Last Dose  . heparin lock flush 100 unit/mL  250 Units Intracatheter PRN Leia Alf, MD      . heparin lock flush 100 unit/mL  500 Units Intracatheter PRN Leia Alf, MD      . sodium chloride 0.9 % injection 10 mL  10 mL Intracatheter PRN Leia Alf, MD      . sodium chloride 0.9 % injection 10 mL  10 mL Intracatheter PRN Leia Alf, MD      . sodium chloride 0.9 % injection 10 mL  10 mL Intracatheter PRN Ma Hillock, Sandeep, MD      . sodium chloride 0.9 % injection 10 mL  10 mL Intracatheter PRN Leia Alf, MD      -year-old  OBJECTIVE: There were no vitals filed for this visit.   There is no height or weight on file to calculate BMI.    ECOG FS:0 - Asymptomatic  General: Well-developed, well-nourished, no acute distress. Eyes: Pink conjunctiva, anicteric sclera.  Stye on left eyelid. HEENT: Lymphadenopathy in left neck no longer palpable. Lungs: Clear to auscultation bilaterally. Heart: Regular rate and rhythm. No rubs, murmurs, or gallops. Abdomen: Soft, nontender, nondistended. No organomegaly noted, normoactive bowel sounds. Musculoskeletal: No edema, cyanosis, or clubbing. Neuro: Alert, appears confused. Cranial nerves grossly intact. Skin: No rashes or petechiae noted. Psych: Anxious.   LAB RESULTS:  Lab Results  Component Value Date   NA 139  11/15/2017   K 3.8 11/15/2017   CL 107 11/15/2017   CO2 21 (L) 11/15/2017   GLUCOSE 103 (H) 11/15/2017   BUN 22 (H) 11/15/2017   CREATININE 0.70 11/15/2017   CALCIUM 8.6 (L) 11/15/2017   PROT 6.5 11/15/2017   ALBUMIN 3.2 (L) 11/15/2017   AST 21 11/15/2017   ALT 21 11/15/2017   ALKPHOS 107 11/15/2017   BILITOT 0.6 11/15/2017   GFRNONAA >60 11/15/2017   GFRAA >60 11/15/2017    Lab Results  Component Value Date   WBC 14.3 (H) 11/17/2017   NEUTROABS 20.7 (H) 11/15/2017   HGB 11.9 (L) 11/17/2017   HCT 36.2 11/17/2017   MCV 96.0 11/17/2017   PLT 217 11/17/2017     STUDIES: Dg Chest Portable 1 View  Result Date: 11/15/2017 CLINICAL DATA:  Shortness of breath.  History of colon cancer. EXAM: PORTABLE CHEST 1 VIEW COMPARISON:  PET scan of November 02, 2017. Prior radiographs are not available for comparison. FINDINGS: The heart size and mediastinal contours are within normal limits.  No pneumothorax is noted. Atherosclerosis of thoracic aorta is noted. Emphysematous disease is noted throughout both lungs. Mild left basilar atelectasis or infiltrate is noted with minimal left pleural effusion. The visualized skeletal structures are unremarkable. IMPRESSION: Mild left basilar atelectasis or infiltrate is noted with minimal left pleural effusion. Aortic Atherosclerosis (ICD10-I70.0) and Emphysema (ICD10-J43.9). Electronically Signed   By: Marijo Conception, M.D.   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 Stage IVa head and neck squamous cell carcinoma: PET scan results from November 02, 2017 reviewed independently with significant improvement appearance compared to previous exam with minimal residual abnormal metabolic activity.  This is possibly related to inflammation from recent XRT.  No intervention is needed at this time.  Patient will require ENT follow-up in the near future and will repeat imaging in  approximately 3 months.   2.  Adenocarcinoma of the colon: Given patient's decreased performance status, she will be reevaluated by surgery in approximately 3 weeks. CEA is within normal limits. PET scan as above. 3.  Fractured ankles: Continue follow-up and treatment per orthopedics.  Continue physical therapy. 4.  Confusion, anxiety, depression: Improved.  Possibly related to underlying pneumonia.  MRI of the brain negative. 5.  Pneumonia: Continue current antibiotics as prescribed. 6.  Decreased performance status: Patient reports her appetite is improving.  Continue home health and physical therapy. 7.  Follow-up: Return to clinic in 4 weeks with repeat laboratory work and further evaluation.     [name], MD 12/13/17 9:25 AM

## 2017-12-17 ENCOUNTER — Other Ambulatory Visit: Payer: Self-pay

## 2017-12-17 ENCOUNTER — Inpatient Hospital Stay: Payer: PPO

## 2017-12-17 ENCOUNTER — Other Ambulatory Visit: Payer: Self-pay | Admitting: *Deleted

## 2017-12-17 ENCOUNTER — Encounter: Payer: Self-pay | Admitting: Oncology

## 2017-12-17 ENCOUNTER — Inpatient Hospital Stay (HOSPITAL_BASED_OUTPATIENT_CLINIC_OR_DEPARTMENT_OTHER): Payer: PPO | Admitting: Oncology

## 2017-12-17 ENCOUNTER — Inpatient Hospital Stay: Payer: PPO | Attending: Oncology

## 2017-12-17 ENCOUNTER — Inpatient Hospital Stay: Payer: PPO | Admitting: Oncology

## 2017-12-17 VITALS — BP 131/73 | HR 56 | Temp 97.6°F | Resp 22

## 2017-12-17 DIAGNOSIS — E876 Hypokalemia: Secondary | ICD-10-CM

## 2017-12-17 DIAGNOSIS — G893 Neoplasm related pain (acute) (chronic): Secondary | ICD-10-CM

## 2017-12-17 DIAGNOSIS — J189 Pneumonia, unspecified organism: Secondary | ICD-10-CM | POA: Diagnosis not present

## 2017-12-17 DIAGNOSIS — E119 Type 2 diabetes mellitus without complications: Secondary | ICD-10-CM | POA: Diagnosis not present

## 2017-12-17 DIAGNOSIS — C189 Malignant neoplasm of colon, unspecified: Secondary | ICD-10-CM

## 2017-12-17 DIAGNOSIS — E860 Dehydration: Secondary | ICD-10-CM | POA: Diagnosis not present

## 2017-12-17 DIAGNOSIS — C760 Malignant neoplasm of head, face and neck: Secondary | ICD-10-CM | POA: Diagnosis not present

## 2017-12-17 DIAGNOSIS — C4442 Squamous cell carcinoma of skin of scalp and neck: Secondary | ICD-10-CM | POA: Diagnosis not present

## 2017-12-17 DIAGNOSIS — M810 Age-related osteoporosis without current pathological fracture: Secondary | ICD-10-CM | POA: Diagnosis not present

## 2017-12-17 DIAGNOSIS — M8580 Other specified disorders of bone density and structure, unspecified site: Secondary | ICD-10-CM | POA: Diagnosis not present

## 2017-12-17 DIAGNOSIS — S82891D Other fracture of right lower leg, subsequent encounter for closed fracture with routine healing: Secondary | ICD-10-CM | POA: Diagnosis not present

## 2017-12-17 DIAGNOSIS — S82832D Other fracture of upper and lower end of left fibula, subsequent encounter for closed fracture with routine healing: Secondary | ICD-10-CM | POA: Diagnosis not present

## 2017-12-17 DIAGNOSIS — I10 Essential (primary) hypertension: Secondary | ICD-10-CM | POA: Diagnosis not present

## 2017-12-17 DIAGNOSIS — J440 Chronic obstructive pulmonary disease with acute lower respiratory infection: Secondary | ICD-10-CM | POA: Diagnosis not present

## 2017-12-17 LAB — COMPREHENSIVE METABOLIC PANEL
ALBUMIN: 3.3 g/dL — AB (ref 3.5–5.0)
ALT: 16 U/L (ref 14–54)
AST: 25 U/L (ref 15–41)
Alkaline Phosphatase: 99 U/L (ref 38–126)
Anion gap: 12 (ref 5–15)
BILIRUBIN TOTAL: 0.6 mg/dL (ref 0.3–1.2)
BUN: 17 mg/dL (ref 6–20)
CHLORIDE: 105 mmol/L (ref 101–111)
CO2: 21 mmol/L — ABNORMAL LOW (ref 22–32)
CREATININE: 0.74 mg/dL (ref 0.44–1.00)
Calcium: 9.1 mg/dL (ref 8.9–10.3)
GFR calc Af Amer: >60 mL/min (ref >60)
GFR calc non Af Amer: >60 mL/min (ref >60)
GLUCOSE: 103 mg/dL — AB (ref 65–99)
POTASSIUM: 2.7 mmol/L — AB (ref 3.5–5.1)
Sodium: 138 mmol/L (ref 135–145)
Total Protein: 6.3 g/dL — ABNORMAL LOW (ref 6.5–8.1)

## 2017-12-17 LAB — CBC WITH DIFFERENTIAL/PLATELET
BASOS ABS: 0.1 10*3/uL (ref 0–0.1)
BASOS PCT: 1 %
Eosinophils Absolute: 0 10*3/uL (ref 0–0.7)
Eosinophils Relative: 0 %
HEMATOCRIT: 40.5 % (ref 35.0–47.0)
Hemoglobin: 13.9 g/dL (ref 12.0–16.0)
LYMPHS PCT: 10 %
Lymphs Abs: 1.1 10*3/uL (ref 1.0–3.6)
MCH: 31.7 pg (ref 26.0–34.0)
MCHC: 34.2 g/dL (ref 32.0–36.0)
MCV: 92.6 fL (ref 80.0–100.0)
MONO ABS: 0.6 10*3/uL (ref 0.2–0.9)
Monocytes Relative: 5 %
NEUTROS ABS: 8.9 10*3/uL — AB (ref 1.4–6.5)
Neutrophils Relative %: 84 %
PLATELETS: 339 10*3/uL (ref 150–440)
RBC: 4.38 MIL/uL (ref 3.80–5.20)
RDW: 14.1 % (ref 11.5–14.5)
WBC: 10.6 10*3/uL (ref 3.6–11.0)

## 2017-12-17 LAB — MAGNESIUM: MAGNESIUM: 1.6 mg/dL — AB (ref 1.7–2.4)

## 2017-12-17 MED ORDER — SODIUM CHLORIDE 0.9 % IV SOLN
Freq: Once | INTRAVENOUS | Status: DC
  Filled 2017-12-17: qty 10

## 2017-12-17 MED ORDER — SODIUM CHLORIDE 0.9 % IV SOLN
INTRAVENOUS | Status: DC
  Administered 2017-12-17: 15:00:00 via INTRAVENOUS

## 2017-12-17 MED ORDER — POTASSIUM CHLORIDE 20 MEQ/100ML IV SOLN: 20.0000 meq | Freq: Once | INTRAVENOUS | Status: DC

## 2017-12-17 MED ORDER — POTASSIUM CHLORIDE CRYS ER 20 MEQ PO TBCR: 20.0000 meq | EXTENDED_RELEASE_TABLET | Freq: Two times a day (BID) | ORAL | 0 refills | Status: DC

## 2017-12-17 MED ORDER — MORPHINE SULFATE 2 MG/ML IJ SOLN
2.0000 mg | Freq: Once | INTRAMUSCULAR | Status: AC
  Administered 2017-12-17: 2 mg via INTRAVENOUS
  Filled 2017-12-17: qty 1

## 2017-12-17 MED ORDER — MORPHINE SULFATE 2 MG/ML IJ SOLN: 2.0000 mg | Freq: Once | INTRAMUSCULAR | Status: DC

## 2017-12-17 MED ORDER — FENTANYL 12 MCG/HR TD PT72: 12.5000 ug | MEDICATED_PATCH | TRANSDERMAL | 0 refills | Status: DC

## 2017-12-17 MED ORDER — HYDROCODONE-ACETAMINOPHEN 5-325 MG PO TABS: 1.0000 | ORAL_TABLET | Freq: Four times a day (QID) | ORAL | 0 refills | Status: DC | PRN

## 2017-12-17 MED ORDER — SODIUM CHLORIDE 0.9 % IV SOLN: INTRAVENOUS | Status: DC

## 2017-12-17 NOTE — Progress Notes (Signed)
Symptom Management Consult note [facility]  Telephone:([phone] Fax:([phone]) [phone]  Patient Care Team: Glendon Axe, MD as PCP - General (Internal Medicine) Knox Royalty, RN as [place] Management   Name of the patient: Dana Perez  [id number]  December 11, 1940   Date of visit: 12/22/17  Diagnosis-Stage IV A head and neck squamous cell carcinoma, adenocarcinoma of the colon  Chief complaint/ Reason for visit-Pain to right jaw  Heme/Onc history: Patient last seen by primary medical oncologist Dr. Grayland Ormond on 11/19/2017 for follow-up and discussion of MRI results.  At that visit, she felt improved from her recent admission to the hospital for pneumonia.  She continued to complain of significant weakness and fatigue but otherwise felt better.  She did not appear confused during this visit.  PET scan results from November 02, 2017 reviewed significant improvement of her disease burden.  No intervention was needed at that time.  Patient was referred to ENT.  Repeat imaging was scheduled for 3 months.  She was also scheduled to be seen by surgery for her adenocarcinoma of the colon, but given her decreased performance status scheduled for several weeks out.  Her CEA is currently within normal limits.   She was seen and evaluated by radiation oncologist Dr. 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  She was noted to be doing poorly although she had a great response on her recent PET scan showing only residual hypermetabolic activity most recent brain MRI did not show evidence of metastatic disease.  She is referred back to ENT for possible resection of the residual disease in her right neck.  Due to her declining performance status there is worry that she may not tolerate surgery.   Seen by Dr. Dahlia Byes on 12/02/2016 for evaluation for surgery of colon.   Unfortunately due to her deterioration of her overall health and performance status she is likely not a good candidate for major operation at this time.  She was to be seen back in 3 weeks to see if this is improved.  She was not showing signs of obstruction.  Evaluated by [facility], and told mass was to complicated for surgery. Referred to [facility].   Per recent notes, she is scheduled to see an oncology surgical specialist in [place] on Friday, May 10.   Interval history-   Dana Perez is a 77 y.o. female who presents with a report of pain. Her pain is located: Head and Neck.  Laterality: Left Her pain is: Yes, Describe:  10/10 and "unbearable" Associated signs and symptoms include: Head and Neck She has had these symptoms for 3 weeks  She describes this pain as: constant and aching She describes severity of his pain as:  Getting worse her symptoms occur all day She  has not had trauma or changes in activity. Her pain improves with medication Her pain is worsened with: any activity She has tried over-the-counter medications. If so these include: Tylenol and Ibuprohen She reports that these have been of benefit: yes  ECOG FS:2 - Symptomatic, <50% confined to bed  Review of systems- Review of Systems  Constitutional: Positive for malaise/fatigue and weight loss. Negative for chills and fever.  HENT: Negative for congestion and ear pain.   Eyes: Negative.  Negative for blurred vision and double vision.  Respiratory: Negative.  Negative for cough, sputum production and shortness of breath.   Cardiovascular:  Negative.  Negative for chest pain, palpitations and leg swelling.  Gastrointestinal: Negative.  Negative for abdominal pain, constipation, diarrhea, nausea and vomiting.  Genitourinary: Negative for dysuria, frequency and urgency.  Musculoskeletal: Negative for back pain and falls.  Skin: Negative.  Negative for rash.       Right sided submandibular mass  Neurological:  Positive for weakness. Negative for dizziness and headaches.  Endo/Heme/Allergies: Negative.  Does not bruise/bleed easily.  Psychiatric/Behavioral: Positive for depression. The patient is not nervous/anxious and does not have insomnia.      Current treatment- None at this time. Awaiting [facility] specialist service on Dec 25, 2017.   Allergies  Allergen Reactions  . Other Other (See Comments) and Rash    TDAP  . Tetanus Antitoxin Rash     Past Medical History:  Diagnosis Date  . Adenocarcinoma of colon ([place])   . Benign neoplasm of ascending colon   . Essential hypertension 06/13/2014  . Goals of care, counseling/discussion 07/14/2017  . HLD (hyperlipidemia) 06/13/2014  . Hypercholesteremia   . Hyperlipidemia   . Multiple thyroid nodules 01/22/2012  . Osteoporosis   . Polycythemia, secondary 12/26/2014  . Pure hypercholesterolemia 03/15/2015  . Thyroid nodule      Past Surgical History:  Procedure Laterality Date  . ABDOMINAL HYSTERECTOMY    . COLONOSCOPY WITH PROPOFOL N/A 07/02/2017   Procedure: COLONOSCOPY WITH PROPOFOL;  Surgeon: Lucilla Lame, MD;  Location: [place];  Service: Endoscopy;  Laterality: N/A;  . ORIF ANKLE FRACTURE Left 08/08/2017   Procedure: OPEN REDUCTION INTERNAL FIXATION (ORIF) ANKLE FRACTURE;  Surgeon: Dereck Leep, MD;  Location: [facility];  Service: Orthopedics;  Laterality: Left;  . POLYPECTOMY N/A 07/02/2017   Procedure: POLYPECTOMY;  Surgeon: Lucilla Lame, MD;  Location: [place];  Service: Endoscopy;  Laterality: N/A;  . WRIST FRACTURE SURGERY  08/2009   badly broken    Social History   Socioeconomic History  . Marital status: Married    Spouse name: Not on file  . Number of children: Not on file  . Years of education: Not on file  . Highest education level: Not on file  Occupational History  . Not on file  Social Needs  . Financial resource strain: Patient refused  . Food insecurity:    Worry: Patient refused     Inability: Patient refused  . Transportation needs:    Medical: Patient refused    Non-medical: Patient refused  Tobacco Use  . Smoking status: Former Smoker    Packs/day: 0.25    Types: Cigarettes    Last attempt to quit: 06/17/2017    Years since quitting: 0.5  . Smokeless tobacco: Never Used  Substance and Sexual Activity  . Alcohol use: No  . Drug use: No  . Sexual activity: Not Currently  Lifestyle  . Physical activity:    Days per week: Patient refused    Minutes per session: Patient refused  . Stress: Patient refused  Relationships  . Social connections:    Talks on phone: Patient refused    Gets together: Patient refused    Attends religious service: Patient refused    Active member of club or organization: Patient refused    Attends meetings of clubs or organizations: Patient refused    Relationship status: Patient refused  . Intimate partner violence:    Fear of current or ex partner: Patient refused    Emotionally abused: Patient refused    Physically abused: Patient refused    Forced sexual  activity: Patient refused  Other Topics Concern  . Not on file  Social History Narrative  . Not on file    Family History  Problem Relation Age of Onset  . Heart disease Mother   . Diabetes Father      Current Outpatient Medications:  .  acetaminophen (TYLENOL) 500 MG tablet, Take 1,000 mg by mouth every 4 (four) hours as needed. , Disp: , Rfl:  .  amLODipine (NORVASC) 5 MG tablet, Take 5 mg by mouth daily. , Disp: , Rfl:  .  calcium carbonate (OSCAL) 1500 (600 Ca) MG TABS tablet, Take 600 mg of elemental calcium by mouth daily with breakfast., Disp: , Rfl:  .  clotrimazole (MYCELEX) 10 MG troche, TAKE 1 TABLET BY MOUTH 5 (FIVE) TIMES DAILY DISSOLVE TABLET SLOWLY AND COMPLETELY IN MOUTH., Disp: , Rfl: 0 .  ibuprofen (ADVIL,MOTRIN) 200 MG tablet, Take 400 mg by mouth every 4 (four) hours as needed., Disp: , Rfl:  .  pravastatin (PRAVACHOL) 10 MG tablet, Take 10 mg by  mouth daily., Disp: , Rfl:  .  fentaNYL (DURAGESIC - DOSED MCG/HR) 12 MCG/HR, Place 1 patch (12.5 mcg total) onto the skin every 3 (three) days., Disp: 10 patch, Rfl: 0 .  HYDROcodone-acetaminophen (NORCO) 5-325 MG tablet, Take 1 tablet by mouth every 6 (six) hours as needed for moderate pain., Disp: 30 tablet, Rfl: 0 .  potassium chloride SA (K-DUR,KLOR-CON) 20 MEQ tablet, Take 1 tablet (20 mEq total) by mouth 2 (two) times daily., Disp: 28 tablet, Rfl: 0  Current Facility-Administered Medications:  .  morphine 2 MG/ML injection 2 mg, 2 mg, Intravenous, Once, Karrington Mccravy, Wandra Feinstein, NP  Facility-Administered Medications Ordered in Other Visits:  .  heparin lock flush 100 unit/mL, 250 Units, Intracatheter, PRN, Ma Hillock, Sandeep, MD .  heparin lock flush 100 unit/mL, 500 Units, Intracatheter, PRN, Ma Hillock, Sandeep, MD .  potassium chloride 20 mEq in sodium chloride 0.9 % 500 mL injection, , Intravenous, Once, Saunders, Doni H, RN .  sodium chloride 0.9 % injection 10 mL, 10 mL, Intracatheter, PRN, Ma Hillock, Sandeep, MD .  sodium chloride 0.9 % injection 10 mL, 10 mL, Intracatheter, PRN, Ma Hillock, Sandeep, MD .  sodium chloride 0.9 % injection 10 mL, 10 mL, Intracatheter, PRN, Ma Hillock, Sandeep, MD .  sodium chloride 0.9 % injection 10 mL, 10 mL, Intracatheter, PRN, Leia Alf, MD  Physical exam:  Vitals:   12/17/17 1401  BP: 131/73  Pulse: (!) 56  Resp: (!) 22  Temp: 97.6 F (36.4 C)  TempSrc: Tympanic  SpO2: 97%   Physical Exam  Constitutional: She is oriented to person, place, and time. Vital signs are normal. She appears ill. She appears distressed.  HENT:  Head: Normocephalic and atraumatic.  Large, firm mass to right submandibular. Skin intact.   Eyes: Pupils are equal, round, and reactive to light.  Neck: Normal range of motion.  Cardiovascular: Normal rate, regular rhythm and normal heart sounds.  No murmur heard. Pulmonary/Chest: Effort normal and breath sounds normal. She has no  wheezes.  Abdominal: Soft. Normal appearance and bowel sounds are normal. She exhibits no distension. There is no tenderness.  Musculoskeletal: Normal range of motion. She exhibits no edema.  Neurological: She is alert and oriented to person, place, and time.  Skin: Skin is warm, dry and intact. No rash noted. There is pallor.  Psychiatric: Judgment normal.     CMP Latest Ref Rng & Units 12/17/2017  Glucose 65 - 99 mg/dL 103(H)  BUN 6 -  20 mg/dL 17  Creatinine 0.44 - 1.00 mg/dL 0.74  Sodium 135 - 145 mmol/L 138  Potassium 3.5 - 5.1 mmol/L 2.7(LL)  Chloride 101 - 111 mmol/L 105  CO2 22 - 32 mmol/L 21(L)  Calcium 8.9 - 10.3 mg/dL 9.1  Total Protein 6.5 - 8.1 g/dL 6.3(L)  Total Bilirubin 0.3 - 1.2 mg/dL 0.6  Alkaline Phos 38 - 126 U/L 99  AST 15 - 41 U/L 25  ALT 14 - 54 U/L 16   CBC Latest Ref Rng & Units 12/17/2017  WBC 3.6 - 11.0 K/uL 10.6  Hemoglobin 12.0 - 16.0 g/dL 13.9  Hematocrit 35.0 - 47.0 % 40.5  Platelets 150 - 440 K/uL 339    No images are attached to the encounter.  No results found.   Assessment and plan- Patient is a 77 y.o. female who presents for painful right submandibular mass.  1. Head and Neck Cancer/Adeno of colon: Has been referred to [facility]NT with Dr. Frazier Butt but has not heard from them for an appt. Appt scheuled for Dec 25, 2017.  Referred from Dr. Pryor Ochoa with [facility].  Currently colon cancer surgery on hold due to declining performance status.  S/p 3 cyles of Cisplatin. Completed 07/28/17.  Discontinued secondary to left ankle fracture and delayed wound healing. S/p 3 cycles of Erbitux. Completed on 09/22/17. Completed radiation to right submandibular mass on 09/23/17. Repeat PET from March 2019 showed improved disease burden. No further treatment scheduled for head and neck cancer.  Referred back to surgery for colon cancer-not surgery candidate at this time due to declining performance status. Awaiting appointment with [facility] for complicated  submandibular mass.    2. Dehydration/Hypokalemia/Pain:  Plan: STAT labs: Hypokalemia (2.7), hypomagnesemia (1.6).  1 liter NaCl/ 69mEq over 2 hours today.  Will bring patient back tomorrow for 40 mEq of potassium with additional 1 L NaCl. 2 mg Morphine for pain relief in clinic.   On reassessment, patient feeling overall better.  Pain tolerable.  Discussed pain medication options.  Currently patient is taking 6000 mg acetaminophen and 1200 mg Ibuprofen daily.  Patient discouraged from taking over 4 g of acetaminophen daily.  Patient's husband states this is the only thing that has been keeping her comfortable.   Will prescribe:    RX Fentanyl 12.5 mcg/hr RX Norco 5-325 q 6 PRN.  RX Potassium 20 MEq BID for 14 days.   Patient will return to clinic tomorrow to be reevaluated and receive 1 L NaCl 40 mEq of potassium. Will bring patient back in one week for labs only to re-check levels.   Visit Diagnosis 1. Hypokalemia   2. Neoplasm related pain   3. Hypomagnesemia     Patient expressed understanding and was in agreement with this plan. She also understands that She can call clinic at any time with any questions, concerns, or complaints.   Greater than 50% was spent in counseling and coordination of care with this patient including but not limited to discussion of the relevant topics above (See A&P) including, but not limited to diagnosis and management of acute and chronic medical conditions.    Faythe Casa, AGNP-C [facility] [place]- [phone] Pager- [phone] 12/22/2017 9:54 AM

## 2017-12-18 ENCOUNTER — Inpatient Hospital Stay: Payer: PPO

## 2017-12-18 DIAGNOSIS — E860 Dehydration: Secondary | ICD-10-CM

## 2017-12-18 DIAGNOSIS — R221 Localized swelling, mass and lump, neck: Secondary | ICD-10-CM | POA: Diagnosis not present

## 2017-12-18 DIAGNOSIS — E876 Hypokalemia: Secondary | ICD-10-CM

## 2017-12-18 DIAGNOSIS — C4492 Squamous cell carcinoma of skin, unspecified: Secondary | ICD-10-CM | POA: Diagnosis not present

## 2017-12-18 DIAGNOSIS — C4442 Squamous cell carcinoma of skin of scalp and neck: Secondary | ICD-10-CM | POA: Diagnosis not present

## 2017-12-18 LAB — CEA: CEA1: 3.6 ng/mL (ref 0.0–4.7)

## 2017-12-18 MED ORDER — POTASSIUM CHLORIDE 2 MEQ/ML IV SOLN
Freq: Once | INTRAVENOUS | Status: AC
  Administered 2017-12-18: 09:00:00 via INTRAVENOUS
  Filled 2017-12-18: qty 1000

## 2017-12-21 ENCOUNTER — Encounter: Payer: Self-pay | Admitting: *Deleted

## 2017-12-21 ENCOUNTER — Other Ambulatory Visit: Payer: Self-pay | Admitting: *Deleted

## 2017-12-21 NOTE — Patient Outreach (Signed)
[facility]) Care Management [place] Telephone Outreach Post-hospital discharge day 35  12/21/2017  Parker 1941-05-26 [id number]  Successful telephone outreach to Vivianne Master, spouse/ caregiver, on [facility] CM written consent, for Dana Perez, 77 y.o.femalereferred originally to [place] by insurance plan; screening call by [facility] telephonic RN CM revealed hospitalization December 21-24, 2018 for bilateral ankle fractures; patient was discharged to SNF for rehabilitation, and discharged to home from SNF / self-care with home health [facility]) services in place, on September 26, 2017. Patient referred to [place] by [facility] CSW for transition of care following SNF discharge on 09/26/17.Patient has history including, but not limited to, HTN/ HLD; colon cancer and head/ neck cancer.  Patientexperienced hospital re-admissionMarch 31- November 17, 2017 for HCAP, Acute respiratory Failure with hypoxia, dehydration.Patient was discharged home to self-care with home health services in place through [place] for RN and PT.HIPAA/ identity verified with patient's spouse during phone call today.   Today,patient'scaregiverreports thatpatient"is really about the same," but states that "her jaw has really started causing her a lot of pain."  Reports that since we last spoke, patient's pain is "25/ 10," "worse pain ever," when she is not using her prescribed pain medication; reports pain medication "helps quite a bit," but "just doesn't seem to last very long."  Reports patient pain levels at "probably 5/10" when patient is taking her prescribed pain medication.  Also reports today that he believes that her new (R) jaw swelling/ pain has "now started affecting her ability to eat;" stating that she "isn't eating as much as she should."  Caregiver denies new/ recent patient falls, and we thoroughly discussed ongoing fall risks/ prevention measures around patient's  newly prescribed pain medication.  Caregiver further reports:  -- No concerns/ issues/ questions or problems around patient's medications;we discussed recent changes to patient's medications as a result of recent oncology provider office visit; caregiver is able to accurately verbalize instructions for use of newly prescribed medications, including potassium, and reports that patient is taking all as prescribed.  Denies that patient is having difficulty swallowing medications with increased jaw swelling.  Reports using ibuprofen for pain management as prescribed, in addition to fentanyl patch and hydrocodone.   --home health (HH)nursingservicescontinue; reports home health PT "has signed off, as of last week."  Reports [facility] nurse to visit 'this week"  --recent and upcomingprovider appointments reviewed;husband/ caregiverconfirms that patient has acquired appointment to visit with oncology surgical specialist in [place] on Friday Dec 25, 2017, and reports he will provide transportation to this appointment; states that both he and patient "are hoping for answers" for upcoming plan to manage new (R) jaw swelling.  Caregiver continues to report that he feels confident in his ability to assist patient in and out of vehicle.  Encouraged caregiver to discuss his concerns around ongoing pain management in light of patient's pain levels with new jaw swelling with surgeon, and caregiver agrees to do so.  Patient's caregiver deniesfurther issues, concerns, or problems today.I confirmed that patient/ spousehavemy direct phone number, the main [facility] CM office phone number, and the [facility] CM 24-hour nurse advice phone number should issues arise prior to next scheduled [place] telephone callnext week post-oncology surgeon office visit in [place].Encouraged patient/ spouseto contact me or [facility] CM main office directly if needs, questions, issues, or concerns arise prior to next  scheduled outreach and heagreed to do so.  Plan:  Patient will take medications as  prescribed and will attend all scheduled provider appointments  Patient will actively participate in home health services as ordered post-hospital dischargeas long as they are active in patient's care  Patient willcontinueusingassistive devices for ambulation  Patient/ spouse will promptly notify patient's care providers for any new concerns, issues, or problems that arise  [facility] CM outreachto continue with scheduledtelephone callnext week  [facility] CM Care Plan Problem One     Most Recent Value  Care Plan Problem One  Risk for hospital readmission, related to/ as evidenced by recent hospitalization for HCAP March 31- November 17, 2017  Role Documenting the Problem One  Care Management [place] for Problem One  Not Active  [facility] Long Term Goal   Over the next 31 days, patient will not experience hospital re-admission, as evidenced by patient/ caregiver reporting and review of EMR during [facility] RN CCM outreach  [facility] Long Term Goal Start Date  11/19/17  [facility] Long Term Goal Met Date  12/21/17 - Goal met  Interventions for Problem One Long Term Goal  Patient did not experience hospital re-admission within 31 days of last hospital discharge  [facility] CM Short Term Goal #1   Over the next 30 days, patient will actively participate in home health services as ordered post- hospital discharge, as evidenced by caregiver reporting and collaboration with home health team, as indicated, during [place] RN outreach  [facility] CM Short Term Goal #1 Start Date  11/19/17  [facility] CM Short Term Goal #1 Met Date  12/21/17 - Goal met  Interventions for Short Term Goal #1  Confirmed that patient has continued participating in home health services as ordered post-hospital discharge    [facility] CM Care Plan Problem Two     Most Recent Value  Care Plan Problem Two  High Risk for falls, as evidenced by recent fall requiring  hospitalization and SNF placement  Role Documenting the Problem Two  Care Management Coordinator  Care Plan for Problem Two  Active  Interventions for Problem Two Long Term Goal   Discussed with patient's caregiver patient's new development of increased jaw pain and newly prescribed pain medication,  discussed with caregiver need to continue ensuring that patient uses assistive devices with ambulation and has supervision with mobility efforts while taking pain medication,  positive reinforcement provided that patient has not experienced any new or recent falls  [facility] Long Term Goal  Over the next 60 days, patient will use assistive devices as prescribed post- SNF discharge, as evidenced by patient/ caregiver reporting during [facility] RN CM outreach  [facility] Long Term Goal Start Date  11/19/17    [facility] CM Care Plan Problem Three     Most Recent Value  Care Plan Problem Three  Uncertainty around plan for patient's new jaw swelling, as evidenced by caregiver reporting  Role Documenting the Problem Three  Care Management Coordinator  Care Plan for Problem Three  Active  [facility] Long Term Goal   Over the next 31 days, patient/ caregiver will be able to verbalize plan for management of patient's new jaw swelling, as evidenced by patient/ caregiver reporting of same during [facility] RN CCM outreach  [facility] Long Term Goal Start Date  12/21/17  Interventions for Problem Three Long Term Goal  Discussed with patient's caregiver recent office visit with oncologist and newly acquired appointment for oncology surgeon, and ensured that patient's caregiver will provide transportation to appointment,  encouraged caregiver to discuss his concerns around current pain management plan with surgeon  at upcoming appointment     Oneta Rack, RN, BSN, [place] Care Management  [phone]

## 2017-12-25 DIAGNOSIS — Z923 Personal history of irradiation: Secondary | ICD-10-CM | POA: Diagnosis not present

## 2017-12-25 DIAGNOSIS — M1991 Primary osteoarthritis, unspecified site: Secondary | ICD-10-CM | POA: Diagnosis not present

## 2017-12-25 DIAGNOSIS — R221 Localized swelling, mass and lump, neck: Secondary | ICD-10-CM | POA: Diagnosis not present

## 2017-12-25 DIAGNOSIS — T797XXA Traumatic subcutaneous emphysema, initial encounter: Secondary | ICD-10-CM | POA: Diagnosis not present

## 2017-12-25 DIAGNOSIS — I252 Old myocardial infarction: Secondary | ICD-10-CM | POA: Diagnosis not present

## 2017-12-25 DIAGNOSIS — I10 Essential (primary) hypertension: Secondary | ICD-10-CM | POA: Diagnosis not present

## 2017-12-25 DIAGNOSIS — N183 Chronic kidney disease, stage 3 (moderate): Secondary | ICD-10-CM | POA: Diagnosis not present

## 2017-12-25 DIAGNOSIS — D751 Secondary polycythemia: Secondary | ICD-10-CM | POA: Diagnosis not present

## 2017-12-25 DIAGNOSIS — Z9221 Personal history of antineoplastic chemotherapy: Secondary | ICD-10-CM | POA: Diagnosis not present

## 2017-12-25 DIAGNOSIS — C760 Malignant neoplasm of head, face and neck: Secondary | ICD-10-CM | POA: Diagnosis not present

## 2017-12-25 DIAGNOSIS — E0789 Other specified disorders of thyroid: Secondary | ICD-10-CM | POA: Diagnosis not present

## 2017-12-25 DIAGNOSIS — Z79899 Other long term (current) drug therapy: Secondary | ICD-10-CM | POA: Diagnosis not present

## 2017-12-25 DIAGNOSIS — E7800 Pure hypercholesterolemia, unspecified: Secondary | ICD-10-CM | POA: Diagnosis not present

## 2017-12-25 DIAGNOSIS — Z87891 Personal history of nicotine dependence: Secondary | ICD-10-CM | POA: Diagnosis not present

## 2017-12-25 DIAGNOSIS — M1990 Unspecified osteoarthritis, unspecified site: Secondary | ICD-10-CM | POA: Diagnosis not present

## 2017-12-25 DIAGNOSIS — Z8589 Personal history of malignant neoplasm of other organs and systems: Secondary | ICD-10-CM | POA: Diagnosis not present

## 2017-12-28 ENCOUNTER — Encounter: Payer: Self-pay | Admitting: *Deleted

## 2017-12-28 ENCOUNTER — Other Ambulatory Visit: Payer: Self-pay | Admitting: *Deleted

## 2017-12-28 NOTE — Patient Outreach (Addendum)
Dana Perez) Care Management [place] Telephone Outreach/ Case Closure  12/28/2017  Dana Perez [facility] 07-23-1941 [id number]  Successful telephone outreach to Dana Perez, spouse/ caregiver, on [facility] CM written consent, forLouise Perez, [place] y.o.femalereferred originally to [place] by insurance plan; screening call by [facility] telephonic RN CM revealedhospitalization December 21-24, 2018 for bilateral ankle fractures; patient was discharged to SNF for rehabilitation, and discharged to home from SNF / self-care with home health [facility]) services in place, on September 26, 2017. Patient referred to [place] by [facility] CSW for transition of care following SNF discharge on 09/26/17.Patient has history including, but not limited to, HTN/ HLD; colon cancer and head/ neck cancer.  Patientexperienced Perez re-admissionMarch 31- November 17, 2017 for HCAP, Acute respiratory Failure with hypoxia, dehydration.Patient was discharged home to self-care with home health services in place through [place] for RN and PT.HIPAA/ identity verified with patient's spouse during phone call today.  Today,patient'scaregiverreportsthatpatient"is about the same," and is in "same pain" due to new (R) jaw swelling; reports continuing to use prescribed pain medication, "which helps for short periods of time."  Again denies new/ recent falls, and confirms that patient continues using assistive devices for ambulation.  Caregiver further reports:  -- No concerns/ issues/ questionsor problems around patient's medications; reports that patient is taking all as prescribed.  Reports ongoing use of ibuprofen for pain management as prescribed, in addition to fentanyl patch and hydrocodone.   --home health (HH)nursingservicescontinue; reports home health RN "just left."  --recent and upcomingprovider appointments reviewed;Dana Perez/ caregiverconfirms that patient attended  visit with oncology surgical specialist [facility] on Friday Dec 25, 2017, and reports that surgery for reconstruction of patent's (R) jaw has been scheduled for "next week," "most likely on Thursday 01/07/18."  Reports that surgeon explained to Dana Perez and patient that surgery would be "extensive, take at least 11-12 hours," and that patient would be hospitalized at [facility] "for 2-3 weeks after surgery."  Dana Perez unsure of overall plan re: discharge disposition after upcoming surgery, stating that Dana Perez hopes to bring patient back home, but Dana Perez acknowledges that due to the extensive nature of surgery, SNF placement may be recommended.   Caregiver confirms that Dana Perez will transport patient to PCP appointment for follow up chest X-ray and office visit "this week."  Caregiver requested today that [place] case be closed "for now," until outcome of planned upcoming surgery is more definitive; we discussed that caregiver will contact me directly when patient has discharged from Perez/  returned home, whether from Perez discharge or SNF.  Caregiver agreed to contact me promptly to update me, as indicated/ allowed, after patient's planned upcoming surgery and Dana Perez confirms that Dana Perez has and will keep my contact information.  Patient's caregiver deniesfurther issues, concerns, or problems today.I confirmed that patient/ spousehavemy direct phone number, the main [facility] CM office phone number, and the [facility] CM 24-hour nurse advice phone number. Encouraged patient/ spouseto contact me or [facility] CM main officedirectly if needs, questions, issues, or concerns arise at any point, and caregiveragreed to do so.  Plan:  Will close patient's [place] CM program pending upcoming extended planned hospitalization, and will re-open as indicated post-Perez discharge; will makes patient's PCP aware of same  [facility] CM Care Plan Problem Two     Most Recent Value  Care Plan Problem Two  High Risk for falls, as  evidenced by recent fall requiring hospitalization and SNF placement  Role Documenting the Problem Two  Care Management Coordinator  Care Plan for Problem Two  Not Active  Interventions for Problem Two Long Term Goal   Confirmed that patient has experienced no new/ recent falls, and is continuing using assistive devices  [facility] Long Term Goal  Over the next 60 days, patient will use assistive devices as prescribed post- SNF discharge, as evidenced by patient/ caregiver reporting during [facility] RN CM outreach  [place] [facility] Long Term Goal Start Date  11/19/17 [Goal re-established today]  [facility] Long Term Goal Met Date  12/28/17 - goal met/ see narrative around case closure    [facility] CM Care Plan Problem Three     Most Recent Value  Care Plan Problem Three  Uncertainty around plan for patient's new jaw swelling, as evidenced by caregiver reporting  Role Documenting the Problem Three  Care Management Coordinator  Care Plan for Problem Three  Not Active  [facility] Long Term Goal   Over the next 31 days, patient/ caregiver will be able to verbalize plan for management of patient's new jaw swelling, as evidenced by patient/ caregiver reporting of same during [place] outreach  [facility] Long Term Goal Start Date  12/21/17  [facility] Long Term Goal Met Date  12/28/17 - goal met  Interventions for Problem Three Long Term Goal  Discussed recent surgical/ oncology provider appointment at [facility],  discussed plan for upcoming surgery with caregiver/ see narrative      Oneta Rack, RN, BSN, [place] Coordinator [facility] Care Management  [phone]

## 2017-12-29 DIAGNOSIS — J189 Pneumonia, unspecified organism: Secondary | ICD-10-CM | POA: Diagnosis not present

## 2017-12-31 DIAGNOSIS — G893 Neoplasm related pain (acute) (chronic): Secondary | ICD-10-CM | POA: Diagnosis not present

## 2017-12-31 DIAGNOSIS — I10 Essential (primary) hypertension: Secondary | ICD-10-CM | POA: Diagnosis not present

## 2017-12-31 DIAGNOSIS — C760 Malignant neoplasm of head, face and neck: Secondary | ICD-10-CM | POA: Diagnosis not present

## 2018-01-07 DIAGNOSIS — C4492 Squamous cell carcinoma of skin, unspecified: Secondary | ICD-10-CM | POA: Diagnosis not present

## 2018-01-07 DIAGNOSIS — I252 Old myocardial infarction: Secondary | ICD-10-CM | POA: Diagnosis not present

## 2018-01-07 DIAGNOSIS — Z452 Encounter for adjustment and management of vascular access device: Secondary | ICD-10-CM | POA: Diagnosis not present

## 2018-01-07 DIAGNOSIS — E041 Nontoxic single thyroid nodule: Secondary | ICD-10-CM | POA: Diagnosis not present

## 2018-01-07 DIAGNOSIS — R918 Other nonspecific abnormal finding of lung field: Secondary | ICD-10-CM | POA: Diagnosis not present

## 2018-01-07 DIAGNOSIS — R638 Other symptoms and signs concerning food and fluid intake: Secondary | ICD-10-CM | POA: Diagnosis not present

## 2018-01-07 DIAGNOSIS — R110 Nausea: Secondary | ICD-10-CM | POA: Diagnosis not present

## 2018-01-07 DIAGNOSIS — R413 Other amnesia: Secondary | ICD-10-CM | POA: Diagnosis not present

## 2018-01-07 DIAGNOSIS — Z791 Long term (current) use of non-steroidal anti-inflammatories (NSAID): Secondary | ICD-10-CM | POA: Diagnosis not present

## 2018-01-07 DIAGNOSIS — I4949 Other premature depolarization: Secondary | ICD-10-CM | POA: Diagnosis not present

## 2018-01-07 DIAGNOSIS — Z923 Personal history of irradiation: Secondary | ICD-10-CM | POA: Diagnosis not present

## 2018-01-07 DIAGNOSIS — Z4682 Encounter for fitting and adjustment of non-vascular catheter: Secondary | ICD-10-CM | POA: Diagnosis not present

## 2018-01-07 DIAGNOSIS — E7800 Pure hypercholesterolemia, unspecified: Secondary | ICD-10-CM | POA: Diagnosis not present

## 2018-01-07 DIAGNOSIS — Z510 Encounter for antineoplastic radiation therapy: Secondary | ICD-10-CM | POA: Diagnosis not present

## 2018-01-07 DIAGNOSIS — C4442 Squamous cell carcinoma of skin of scalp and neck: Secondary | ICD-10-CM | POA: Diagnosis not present

## 2018-01-07 DIAGNOSIS — R432 Parageusia: Secondary | ICD-10-CM | POA: Diagnosis not present

## 2018-01-07 DIAGNOSIS — R682 Dry mouth, unspecified: Secondary | ICD-10-CM | POA: Diagnosis not present

## 2018-01-07 DIAGNOSIS — F05 Delirium due to known physiological condition: Secondary | ICD-10-CM | POA: Diagnosis not present

## 2018-01-07 DIAGNOSIS — Z87891 Personal history of nicotine dependence: Secondary | ICD-10-CM | POA: Diagnosis not present

## 2018-01-07 DIAGNOSIS — I129 Hypertensive chronic kidney disease with stage 1 through stage 4 chronic kidney disease, or unspecified chronic kidney disease: Secondary | ICD-10-CM | POA: Diagnosis not present

## 2018-01-07 DIAGNOSIS — E785 Hyperlipidemia, unspecified: Secondary | ICD-10-CM | POA: Diagnosis not present

## 2018-01-07 DIAGNOSIS — J189 Pneumonia, unspecified organism: Secondary | ICD-10-CM | POA: Diagnosis not present

## 2018-01-07 DIAGNOSIS — E46 Unspecified protein-calorie malnutrition: Secondary | ICD-10-CM | POA: Diagnosis not present

## 2018-01-07 DIAGNOSIS — Z515 Encounter for palliative care: Secondary | ICD-10-CM | POA: Diagnosis not present

## 2018-01-07 DIAGNOSIS — M6250 Muscle wasting and atrophy, not elsewhere classified, unspecified site: Secondary | ICD-10-CM | POA: Diagnosis not present

## 2018-01-07 DIAGNOSIS — Z931 Gastrostomy status: Secondary | ICD-10-CM | POA: Diagnosis not present

## 2018-01-07 DIAGNOSIS — D751 Secondary polycythemia: Secondary | ICD-10-CM | POA: Diagnosis not present

## 2018-01-07 DIAGNOSIS — M1990 Unspecified osteoarthritis, unspecified site: Secondary | ICD-10-CM | POA: Diagnosis not present

## 2018-01-07 DIAGNOSIS — M6281 Muscle weakness (generalized): Secondary | ICD-10-CM | POA: Diagnosis not present

## 2018-01-07 DIAGNOSIS — Z9221 Personal history of antineoplastic chemotherapy: Secondary | ICD-10-CM | POA: Diagnosis not present

## 2018-01-07 DIAGNOSIS — R5383 Other fatigue: Secondary | ICD-10-CM | POA: Diagnosis not present

## 2018-01-07 DIAGNOSIS — C760 Malignant neoplasm of head, face and neck: Secondary | ICD-10-CM | POA: Diagnosis not present

## 2018-01-07 DIAGNOSIS — T451X5A Adverse effect of antineoplastic and immunosuppressive drugs, initial encounter: Secondary | ICD-10-CM | POA: Diagnosis not present

## 2018-01-07 DIAGNOSIS — R627 Adult failure to thrive: Secondary | ICD-10-CM | POA: Diagnosis not present

## 2018-01-07 DIAGNOSIS — R410 Disorientation, unspecified: Secondary | ICD-10-CM | POA: Diagnosis not present

## 2018-01-07 DIAGNOSIS — B379 Candidiasis, unspecified: Secondary | ICD-10-CM | POA: Diagnosis not present

## 2018-01-07 DIAGNOSIS — C069 Malignant neoplasm of mouth, unspecified: Secondary | ICD-10-CM | POA: Diagnosis not present

## 2018-01-07 DIAGNOSIS — R1310 Dysphagia, unspecified: Secondary | ICD-10-CM | POA: Diagnosis not present

## 2018-01-07 DIAGNOSIS — I491 Atrial premature depolarization: Secondary | ICD-10-CM | POA: Diagnosis not present

## 2018-01-07 DIAGNOSIS — C182 Malignant neoplasm of ascending colon: Secondary | ICD-10-CM | POA: Diagnosis not present

## 2018-01-29 ENCOUNTER — Other Ambulatory Visit: Payer: Self-pay | Admitting: *Deleted

## 2018-01-29 DIAGNOSIS — C760 Malignant neoplasm of head, face and neck: Secondary | ICD-10-CM | POA: Diagnosis not present

## 2018-01-30 NOTE — Patient Outreach (Signed)
[place] [facility]) Care Management  01/30/2018  Dana Perez [facility] [birth year] [id number]  Referral from [place]: Reason: Recent hospital discharge 01/25/2018 with [facility] services:  "TOC will be completed by primary care provider office who will refer to [facility] care Management if needed."  Plan: Case closure.   Sherrin Daisy, RN BSN [place] Management Coordinator [facility] Care Management  [phone]

## 2018-02-01 DIAGNOSIS — Z483 Aftercare following surgery for neoplasm: Secondary | ICD-10-CM | POA: Diagnosis not present

## 2018-02-01 DIAGNOSIS — Z431 Encounter for attention to gastrostomy: Secondary | ICD-10-CM | POA: Diagnosis not present

## 2018-02-01 DIAGNOSIS — I129 Hypertensive chronic kidney disease with stage 1 through stage 4 chronic kidney disease, or unspecified chronic kidney disease: Secondary | ICD-10-CM | POA: Diagnosis not present

## 2018-02-01 DIAGNOSIS — E1122 Type 2 diabetes mellitus with diabetic chronic kidney disease: Secondary | ICD-10-CM | POA: Diagnosis not present

## 2018-02-01 DIAGNOSIS — N183 Chronic kidney disease, stage 3 (moderate): Secondary | ICD-10-CM | POA: Diagnosis not present

## 2018-02-01 DIAGNOSIS — L989 Disorder of the skin and subcutaneous tissue, unspecified: Secondary | ICD-10-CM | POA: Diagnosis not present

## 2018-02-01 DIAGNOSIS — Z9181 History of falling: Secondary | ICD-10-CM | POA: Diagnosis not present

## 2018-02-01 DIAGNOSIS — S82891D Other fracture of right lower leg, subsequent encounter for closed fracture with routine healing: Secondary | ICD-10-CM | POA: Diagnosis not present

## 2018-02-01 DIAGNOSIS — E785 Hyperlipidemia, unspecified: Secondary | ICD-10-CM | POA: Diagnosis not present

## 2018-02-01 DIAGNOSIS — S82832D Other fracture of upper and lower end of left fibula, subsequent encounter for closed fracture with routine healing: Secondary | ICD-10-CM | POA: Diagnosis not present

## 2018-02-01 DIAGNOSIS — J449 Chronic obstructive pulmonary disease, unspecified: Secondary | ICD-10-CM | POA: Diagnosis not present

## 2018-02-01 DIAGNOSIS — M8580 Other specified disorders of bone density and structure, unspecified site: Secondary | ICD-10-CM | POA: Diagnosis not present

## 2018-02-01 DIAGNOSIS — C189 Malignant neoplasm of colon, unspecified: Secondary | ICD-10-CM | POA: Diagnosis not present

## 2018-02-01 DIAGNOSIS — C4442 Squamous cell carcinoma of skin of scalp and neck: Secondary | ICD-10-CM | POA: Diagnosis not present

## 2018-02-01 DIAGNOSIS — M810 Age-related osteoporosis without current pathological fracture: Secondary | ICD-10-CM | POA: Diagnosis not present

## 2018-02-01 DIAGNOSIS — Z87440 Personal history of urinary (tract) infections: Secondary | ICD-10-CM | POA: Diagnosis not present

## 2018-02-01 DIAGNOSIS — Z87891 Personal history of nicotine dependence: Secondary | ICD-10-CM | POA: Diagnosis not present

## 2018-02-01 DIAGNOSIS — M1990 Unspecified osteoarthritis, unspecified site: Secondary | ICD-10-CM | POA: Diagnosis not present

## 2018-02-01 DIAGNOSIS — Z8701 Personal history of pneumonia (recurrent): Secondary | ICD-10-CM | POA: Diagnosis not present

## 2018-02-02 DIAGNOSIS — R112 Nausea with vomiting, unspecified: Secondary | ICD-10-CM | POA: Diagnosis not present

## 2018-02-02 DIAGNOSIS — I10 Essential (primary) hypertension: Secondary | ICD-10-CM | POA: Diagnosis not present

## 2018-02-03 DIAGNOSIS — R112 Nausea with vomiting, unspecified: Secondary | ICD-10-CM | POA: Insufficient documentation

## 2018-02-10 ENCOUNTER — Ambulatory Visit (INDEPENDENT_AMBULATORY_CARE_PROVIDER_SITE_OTHER): Payer: PPO | Admitting: Surgery

## 2018-02-10 ENCOUNTER — Encounter: Payer: Self-pay | Admitting: Surgery

## 2018-02-10 DIAGNOSIS — C182 Malignant neoplasm of ascending colon: Secondary | ICD-10-CM

## 2018-02-10 NOTE — Progress Notes (Signed)
[name]  Well known to me for Right colon Ca found on polyp, she does have Met SCC. Underwent a recent Right neck dissection and glosectomy as well as a Gastrostomy tube. She is now recovering from it and still very weak  PE : very debilitated Neck Right wound w some dehiscence, no infection but some aspects are open.  Abd: soft, Gastrostomy tube in place no infection or peritonitis  A/p 77 yowith advanced head and neck cancer status post recent radical neck as well as glossectomy now recovering very slowly.  She did have an incidental colon cancer after polypectomy.  I will see her back in 2 months unfortunately I think that she may never be an operative candidate given her neck disease.  Discussed with the husband detail. I spent approximately 15 minutes in this encounter with the majority of time spent in coordination and counseling of her care.

## 2018-02-17 DIAGNOSIS — C760 Malignant neoplasm of head, face and neck: Secondary | ICD-10-CM | POA: Diagnosis not present

## 2018-02-18 DIAGNOSIS — C189 Malignant neoplasm of colon, unspecified: Secondary | ICD-10-CM | POA: Diagnosis not present

## 2018-02-18 DIAGNOSIS — S82832D Other fracture of upper and lower end of left fibula, subsequent encounter for closed fracture with routine healing: Secondary | ICD-10-CM | POA: Diagnosis not present

## 2018-02-18 DIAGNOSIS — J449 Chronic obstructive pulmonary disease, unspecified: Secondary | ICD-10-CM | POA: Diagnosis not present

## 2018-02-18 DIAGNOSIS — C760 Malignant neoplasm of head, face and neck: Secondary | ICD-10-CM | POA: Diagnosis not present

## 2018-02-18 DIAGNOSIS — C4442 Squamous cell carcinoma of skin of scalp and neck: Secondary | ICD-10-CM | POA: Diagnosis not present

## 2018-02-18 DIAGNOSIS — S82891D Other fracture of right lower leg, subsequent encounter for closed fracture with routine healing: Secondary | ICD-10-CM | POA: Diagnosis not present

## 2018-02-19 DIAGNOSIS — C760 Malignant neoplasm of head, face and neck: Secondary | ICD-10-CM | POA: Diagnosis not present

## 2018-02-19 DIAGNOSIS — E079 Disorder of thyroid, unspecified: Secondary | ICD-10-CM | POA: Diagnosis not present

## 2018-02-19 DIAGNOSIS — R1312 Dysphagia, oropharyngeal phase: Secondary | ICD-10-CM | POA: Diagnosis not present

## 2018-02-22 DIAGNOSIS — N183 Chronic kidney disease, stage 3 (moderate): Secondary | ICD-10-CM | POA: Diagnosis not present

## 2018-02-22 DIAGNOSIS — E1122 Type 2 diabetes mellitus with diabetic chronic kidney disease: Secondary | ICD-10-CM | POA: Diagnosis not present

## 2018-02-22 DIAGNOSIS — I129 Hypertensive chronic kidney disease with stage 1 through stage 4 chronic kidney disease, or unspecified chronic kidney disease: Secondary | ICD-10-CM | POA: Diagnosis not present

## 2018-02-22 DIAGNOSIS — S82832D Other fracture of upper and lower end of left fibula, subsequent encounter for closed fracture with routine healing: Secondary | ICD-10-CM | POA: Diagnosis not present

## 2018-02-22 DIAGNOSIS — C4442 Squamous cell carcinoma of skin of scalp and neck: Secondary | ICD-10-CM | POA: Diagnosis not present

## 2018-02-22 DIAGNOSIS — Z431 Encounter for attention to gastrostomy: Secondary | ICD-10-CM | POA: Diagnosis not present

## 2018-02-22 DIAGNOSIS — C189 Malignant neoplasm of colon, unspecified: Secondary | ICD-10-CM | POA: Diagnosis not present

## 2018-02-22 DIAGNOSIS — Z483 Aftercare following surgery for neoplasm: Secondary | ICD-10-CM | POA: Diagnosis not present

## 2018-02-22 DIAGNOSIS — J449 Chronic obstructive pulmonary disease, unspecified: Secondary | ICD-10-CM | POA: Diagnosis not present

## 2018-03-04 ENCOUNTER — Ambulatory Visit
Admission: RE | Admit: 2018-03-04 | Discharge: 2018-03-04 | Disposition: A | Discharge status: Home / Self Care | Payer: PPO | Source: Ambulatory Visit | Attending: Radiation Oncology | Admitting: Radiation Oncology

## 2018-03-04 ENCOUNTER — Encounter: Payer: Self-pay | Admitting: Radiation Oncology

## 2018-03-04 ENCOUNTER — Other Ambulatory Visit: Payer: Self-pay

## 2018-03-04 VITALS — BP 115/75 | HR 108 | Temp 96.0°F | Resp 18 | Wt 108.1 lb

## 2018-03-04 DIAGNOSIS — C760 Malignant neoplasm of head, face and neck: Secondary | ICD-10-CM | POA: Diagnosis not present

## 2018-03-04 DIAGNOSIS — Z923 Personal history of irradiation: Secondary | ICD-10-CM | POA: Insufficient documentation

## 2018-03-04 DIAGNOSIS — Z08 Encounter for follow-up examination after completed treatment for malignant neoplasm: Secondary | ICD-10-CM | POA: Diagnosis not present

## 2018-03-04 DIAGNOSIS — C4442 Squamous cell carcinoma of skin of scalp and neck: Secondary | ICD-10-CM

## 2018-03-04 DIAGNOSIS — R221 Localized swelling, mass and lump, neck: Secondary | ICD-10-CM | POA: Insufficient documentation

## 2018-03-04 DIAGNOSIS — F17210 Nicotine dependence, cigarettes, uncomplicated: Secondary | ICD-10-CM | POA: Diagnosis not present

## 2018-03-04 DIAGNOSIS — K319 Disease of stomach and duodenum, unspecified: Secondary | ICD-10-CM | POA: Insufficient documentation

## 2018-03-04 DIAGNOSIS — Z9221 Personal history of antineoplastic chemotherapy: Secondary | ICD-10-CM | POA: Diagnosis not present

## 2018-03-04 NOTE — Progress Notes (Signed)
Radiation Oncology Follow up Note  Name: Dana Perez   Date:   03/04/2018 MRN:  [id number] DOB: 24-May-1941    This 77 y.o. female presents to the clinic today for 5 month follow-up status post concurrent chemoradiation therapy for locally advanced squamous cell carcinoma present is a large right submandibular mass.  REFERRING PROVIDER: Glendon Axe, MD  HPI: patient is a 77 year old female now out 5 months having completed concurrent chemoradiation therapy with Erbitux for locally advanced squamous cell carcinoma the head and neck presenting with a large protruding right sub-gastric mass. She a PET scan back inMarch 2019 showing excellent response although there was still residual hypermetabolic activity in the right sub-digastric region..she went on to have a right neck dissection right segmental mandibulectomy radical resection of the right neck with skin involvement and right pectorals flap. She did have residual tumor in the soft tissue spanning approxi-4.5 cm well to moderately well differentiated squamous cell carcinoma with perineural invasion. Tumor invaded the dermis subcutaneous adipose tissue as well as skeletal muscle. VP-16 was negative.the was also involvement of the periosteum of the mandible. Recommend patient [facility] was to continue observation at this time. She's also developed a new left submandibular mass which is scheduled to be biopsied could certainly be further cancer. She continues feeding with a G-tube she is in overall poor general condition.  COMPLICATIONS OF TREATMENT: none  FOLLOW UP COMPLIANCE: keeps appointments   PHYSICAL EXAM:  BP 115/75   Pulse (!) 108   Temp (!) 96 F (35.6 C)   Resp 18   Wt 108 lb 2.2 oz (49 kg)   BMI 18.56 kg/m  Patient has marked trismus making oral examination difficult. She has scabbing in the area icterus flap in her right neck and crusting around some the incisions. She also is a palpable nodule in the anterior submandibular  region on the left side possibly consistent with progressive disease.Well-developed well-nourished patient in NAD. HEENT reveals PERLA, EOMI, discs not visualized.  Oral cavity is clear. No oral mucosal lesions are identified. Neck is clear without evidence of cervical or supraclavicular adenopathy. Lungs are clear to A&P. Cardiac examination is essentially unremarkable with regular rate and rhythm without murmur rub or thrill. Abdomen is benign with no organomegaly or masses noted. Motor sensory and DTR levels are equal and symmetric in the upper and lower extremities. Cranial nerves II through XII are grossly intact. Proprioception is intact. No peripheral adenopathy or edema is identified. No motor or sensory levels are noted. Crude visual fields are within normal range.  RADIOLOGY RESULTS: previous head CT scan reviewed  PLAN: at this time she continues close follow-up care with surgeon's and oncology staff at [facility]. They are planning a biopsy of her left sub-mandibular lymph node. She continues GI tube feeding. I have asked to see her back in 4-5 months for follow-up. Agree with continued observation at this time.  I would like to take this opportunity to thank you for allowing me to participate in the care of your patient.Noreene Filbert, MD

## 2018-03-12 DIAGNOSIS — C760 Malignant neoplasm of head, face and neck: Secondary | ICD-10-CM | POA: Diagnosis not present

## 2018-03-12 DIAGNOSIS — R1312 Dysphagia, oropharyngeal phase: Secondary | ICD-10-CM | POA: Diagnosis not present

## 2018-03-18 DIAGNOSIS — S82891D Other fracture of right lower leg, subsequent encounter for closed fracture with routine healing: Secondary | ICD-10-CM | POA: Diagnosis not present

## 2018-03-18 DIAGNOSIS — C4442 Squamous cell carcinoma of skin of scalp and neck: Secondary | ICD-10-CM | POA: Diagnosis not present

## 2018-03-18 DIAGNOSIS — S82832D Other fracture of upper and lower end of left fibula, subsequent encounter for closed fracture with routine healing: Secondary | ICD-10-CM | POA: Diagnosis not present

## 2018-03-18 DIAGNOSIS — C760 Malignant neoplasm of head, face and neck: Secondary | ICD-10-CM | POA: Diagnosis not present

## 2018-03-18 DIAGNOSIS — C189 Malignant neoplasm of colon, unspecified: Secondary | ICD-10-CM | POA: Diagnosis not present

## 2018-03-18 DIAGNOSIS — J449 Chronic obstructive pulmonary disease, unspecified: Secondary | ICD-10-CM | POA: Diagnosis not present

## 2018-03-19 ENCOUNTER — Telehealth: Payer: Self-pay | Admitting: *Deleted

## 2018-03-19 DIAGNOSIS — C760 Malignant neoplasm of head, face and neck: Secondary | ICD-10-CM | POA: Diagnosis not present

## 2018-03-19 NOTE — Telephone Encounter (Signed)
Telephone Encounter - Lennox Grumbles, MD - 03/16/2018 5:15 PM EDT Formatting of this note might be different from the original. Called to report the FNA of the submental mass was in fact cancer. This appears to me to be a dermal metastasis and further surgery would unlikely be of use. I will reach out to Dr. Grayland Ormond and see if there is something he can offer.   I will see the back as scheduled or sooner if need be.  03/12/18 Final Diagnosis  A: Submandibular, left, fine needle aspiration - Diagnostic of malignancy. - Metastatic squamous carcinoma.      Electronically signed by Lennox Grumbles, MD at 03/16/2018 5:23 PM EDT    I called husband and explained to him that Dr Grayland Ormond has been on vacation and will return Tuesday. He will await response from Dr Grayland Ormond Tuesday or Wednesday

## 2018-03-19 NOTE — Telephone Encounter (Signed)
Dr Sheffield Slider form [facility] would like to speak with Dr Grayland Ormond regarding this patient. Upon his return to the office Please return his call [phone]

## 2018-03-19 NOTE — Telephone Encounter (Signed)
-----   Message from Donnita Falls, RN sent at 03/19/2018  9:55 AM EDT ----- Lorenza Chick called and reported that she had been seen at [facility] and had surgery and the physician at [facility] was suppose to be calling back here for her to see Dr. Grayland Ormond again and restart chemo or other treatments for recurrent disease.  Please call the husband back as he is concerned that he has not heard from anyone about appointments or further treatments.   thanks

## 2018-03-28 NOTE — Progress Notes (Signed)
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  INTERVAL HISTORY: Patient last evaluated in clinic on Dec 17, 2017.  She underwent surgery for residual disease at [facility] several months ago, but despite this now has persistent/recurrent disease in her neck.  Patient's performance status has significantly declined.  She continues to have significant weakness and fatigue.  She has a poor appetite.  She has no neurologic complaints.  She denies any recent fevers. She has no chest pain or shortness of breath. She denies any nausea, vomiting, constipation, or diarrhea.  She has no melena or hematochezia.  She has no urinary complaints.  Patient offers no further specific complaints today.  REVIEW OF SYSTEMS:   Review of Systems  Constitutional: Positive for malaise/fatigue. Negative for fever and weight loss.  HENT: Negative.  Negative for sore throat.   Respiratory: Negative.  Negative for cough, shortness of breath and stridor.   Cardiovascular: Negative.  Negative for chest pain and leg swelling.  Gastrointestinal: Negative.  Negative for abdominal pain, constipation, nausea and vomiting.  Genitourinary: Negative.  Negative for dysuria.  Musculoskeletal: Positive for joint pain. Negative for falls.  Skin: Negative.  Negative for rash.  Neurological: Positive for weakness. Negative for sensory change, focal weakness and headaches.  Psychiatric/Behavioral: Positive for depression and memory loss. The patient is nervous/anxious. The patient does not have insomnia.     As per HPI. Otherwise, a complete review of systems is negative.  PAST MEDICAL HISTORY: Past Medical History:  Diagnosis Date  .  Adenocarcinoma of colon ([place])   . Benign neoplasm of ascending colon   . Essential hypertension 06/13/2014  . Goals of care, counseling/discussion 07/14/2017  . HLD (hyperlipidemia) 06/13/2014  . Hypercholesteremia   . Hyperlipidemia   . Multiple thyroid nodules 01/22/2012  . Osteoporosis   . Polycythemia, secondary 12/26/2014  . Pure hypercholesterolemia 03/15/2015  . Thyroid nodule     PAST SURGICAL HISTORY: Past Surgical History:  Procedure Laterality Date  . ABDOMINAL HYSTERECTOMY    . COLONOSCOPY WITH PROPOFOL N/A 07/02/2017   Procedure: COLONOSCOPY WITH PROPOFOL;  Surgeon: Lucilla Lame, MD;  Location: [place];  Service: Endoscopy;  Laterality: N/A;  . ORIF ANKLE FRACTURE Left 08/08/2017   Procedure: OPEN REDUCTION INTERNAL FIXATION (ORIF) ANKLE FRACTURE;  Surgeon: Dereck Leep, MD;  Location: [facility];  Service: Orthopedics;  Laterality: Left;  . POLYPECTOMY N/A 07/02/2017   Procedure: POLYPECTOMY;  Surgeon: Lucilla Lame, MD;  Location: [place];  Service: Endoscopy;  Laterality: N/A;  . WRIST FRACTURE SURGERY  08/2009   badly broken    FAMILY HISTORY Family History  Problem Relation Age of Onset  . Heart disease Mother   . Diabetes Father        ADVANCED DIRECTIVES:    HEALTH MAINTENANCE: Social History   Tobacco Use  . Smoking status: Former Smoker    Packs/day: 0.25    Types: Cigarettes    Last attempt to quit: 06/17/2017    Years since quitting: 0.7  . Smokeless tobacco: Never Used  Substance Use Topics  . Alcohol use: No  . Drug use: No     Allergies  Allergen Reactions  . Other Other (See Comments) and Rash    TDAP  .  Tetanus Antitoxin Rash    Current Outpatient Medications  Medication Sig Dispense Refill  . acetaminophen (TYLENOL) 500 MG tablet Take 1,000 mg by mouth every 4 (four) hours as needed.     Marland Kitchen amLODipine (NORVASC) 5 MG tablet Take 5 mg by mouth daily.     . calcium carbonate (OSCAL) 1500 (600 Ca) MG TABS  tablet Take 600 mg of elemental calcium by mouth daily with breakfast.    . clotrimazole (MYCELEX) 10 MG troche TAKE 1 TABLET BY MOUTH 5 (FIVE) TIMES DAILY DISSOLVE TABLET SLOWLY AND COMPLETELY IN MOUTH.  0  . fentaNYL (DURAGESIC - DOSED MCG/HR) 12 MCG/HR Place 1 patch (12.5 mcg total) onto the skin every 3 (three) days. 10 patch 0  . HYDROcodone-acetaminophen (NORCO) 5-325 MG tablet Take 1 tablet by mouth every 6 (six) hours as needed for moderate pain. 30 tablet 0  . ibuprofen (ADVIL,MOTRIN) 200 MG tablet Take 400 mg by mouth every 4 (four) hours as needed.    . potassium chloride SA (K-DUR,KLOR-CON) 20 MEQ tablet Take 1 tablet (20 mEq total) by mouth 2 (two) times daily. 28 tablet 0  . pravastatin (PRAVACHOL) 10 MG tablet Take 10 mg by mouth daily.     No current facility-administered medications for this visit.    Facility-Administered Medications Ordered in Other Visits  Medication Dose Route Frequency Provider Last Rate Last Dose  . heparin lock flush 100 unit/mL  250 Units Intracatheter PRN Leia Alf, MD      . heparin lock flush 100 unit/mL  500 Units Intracatheter PRN Leia Alf, MD      . potassium chloride 20 mEq in sodium chloride 0.9 % 500 mL injection   Intravenous Once Tye Savoy, Doni H, RN      . sodium chloride 0.9 % injection 10 mL  10 mL Intracatheter PRN Leia Alf, MD      . sodium chloride 0.9 % injection 10 mL  10 mL Intracatheter PRN Leia Alf, MD      . sodium chloride 0.9 % injection 10 mL  10 mL Intracatheter PRN Ma Hillock, Sandeep, MD      . sodium chloride 0.9 % injection 10 mL  10 mL Intracatheter PRN Leia Alf, MD      -year-old  OBJECTIVE: Vitals:   03/30/18 1421  BP: 102/64  Pulse: 61  Resp: 20  Temp: 97.8 F (36.6 C)     Body mass index is 18.78 kg/m.    ECOG FS:3 - Symptomatic, >50% confined to bed  General: Ill-appearing, no acute distress.  Sitting in a wheelchair. Eyes: Pink conjunctiva, anicteric sclera. HEENT: Recurrent  disease noted just right of midline.   Lungs: Clear to auscultation bilaterally. Heart: Regular rate and rhythm. No rubs, murmurs, or gallops. Abdomen: Soft, nontender, nondistended. No organomegaly noted, normoactive bowel sounds. Musculoskeletal: No edema, cyanosis, or clubbing. Neuro: Alert, answering all questions appropriately. Cranial nerves grossly intact. Skin: No rashes or petechiae noted. Psych: Flat affect.  LAB RESULTS:  Lab Results  Component Value Date   NA 138 12/17/2017   K 2.7 (LL) 12/17/2017   CL 105 12/17/2017   CO2 21 (L) 12/17/2017   GLUCOSE 103 (H) 12/17/2017   BUN 17 12/17/2017   CREATININE 0.74 12/17/2017   CALCIUM 9.1 12/17/2017   PROT 6.3 (L) 12/17/2017   ALBUMIN 3.3 (L) 12/17/2017   AST 25 12/17/2017   ALT 16 12/17/2017   ALKPHOS 99 12/17/2017   BILITOT 0.6 12/17/2017   GFRNONAA >60 12/17/2017   GFRAA >  60 12/17/2017    Lab Results  Component Value Date   WBC 10.6 12/17/2017   NEUTROABS 8.9 (H) 12/17/2017   HGB 13.9 12/17/2017   HCT 40.5 12/17/2017   MCV 92.6 12/17/2017   PLT 339 12/17/2017     STUDIES: No results found.  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  PLAN:    1.  Recurrent, progressive stage IVa head and neck squamous cell carcinoma: Despite recent surgery at [facility] for residual disease, patient continues to have progressive malignancy.  Her performance status is significantly decreased.  We discussed at length the possibility of enrolling in hospice, but patient ultimately decided she would like to try immunotherapy as one last effort for control of disease.  Patient will receive nivolumab every 2 weeks until intolerable side effects or progression of disease.  She expressed understanding that this is likely not curative.  Patient will return to clinic in 1 week to initiate cycle 1 of nivolumab.   2.  Adenocarcinoma of the colon: No intervention is planned at  this time.  Patient could not tolerate any further surgery.  We will get CEA with next lab draw.  3.  Fractured ankles: Continue follow-up and treatment per orthopedics. 4.  Declining performance status: Likely multifactorial.  Continue home health and physical therapy.  Consider referral back to dietary in the near future.  I spent a total of 30 minutes face-to-face with the patient of which greater than 50% of the visit was spent in counseling and coordination of care as detailed above.    [name], MD 04/03/18 7:18 AM

## 2018-03-30 ENCOUNTER — Encounter: Payer: Self-pay | Admitting: Oncology

## 2018-03-30 ENCOUNTER — Inpatient Hospital Stay: Payer: PPO | Attending: Oncology | Admitting: Oncology

## 2018-03-30 VITALS — BP 102/64 | HR 61 | Temp 97.8°F | Resp 20 | Wt 109.4 lb

## 2018-03-30 DIAGNOSIS — R630 Anorexia: Secondary | ICD-10-CM | POA: Diagnosis not present

## 2018-03-30 DIAGNOSIS — Z5112 Encounter for antineoplastic immunotherapy: Secondary | ICD-10-CM | POA: Diagnosis not present

## 2018-03-30 DIAGNOSIS — Z8781 Personal history of (healed) traumatic fracture: Secondary | ICD-10-CM

## 2018-03-30 DIAGNOSIS — R531 Weakness: Secondary | ICD-10-CM

## 2018-03-30 DIAGNOSIS — Z87891 Personal history of nicotine dependence: Secondary | ICD-10-CM

## 2018-03-30 DIAGNOSIS — C189 Malignant neoplasm of colon, unspecified: Secondary | ICD-10-CM | POA: Insufficient documentation

## 2018-03-30 DIAGNOSIS — R5383 Other fatigue: Secondary | ICD-10-CM | POA: Diagnosis not present

## 2018-03-30 DIAGNOSIS — C760 Malignant neoplasm of head, face and neck: Secondary | ICD-10-CM | POA: Diagnosis not present

## 2018-03-30 DIAGNOSIS — E041 Nontoxic single thyroid nodule: Secondary | ICD-10-CM | POA: Insufficient documentation

## 2018-03-30 NOTE — Progress Notes (Signed)
Patient here today for treatment planning, had recent surgery at [facility].

## 2018-04-02 ENCOUNTER — Other Ambulatory Visit: Payer: Self-pay | Admitting: Oncology

## 2018-04-02 DIAGNOSIS — C760 Malignant neoplasm of head, face and neck: Secondary | ICD-10-CM

## 2018-04-02 NOTE — Progress Notes (Signed)
DISCONTINUE ON PATHWAY REGIMEN - Head and Neck     Administer weekly:     Cisplatin   **Always confirm dose/schedule in your pharmacy ordering system**  REASON: Disease Progression PRIOR TREATMENT: HNOS301: Cisplatin 40 mg/m2 Weekly with Concurrent Radiation for 6 - 7 Weeks TREATMENT RESPONSE: Progressive Disease (PD)  START ON PATHWAY REGIMEN - Head and Neck     A cycle is every 14 days:     Nivolumab   **Always confirm dose/schedule in your pharmacy ordering system**  Patient Characteristics: Occult Primary, Local Recurrence, Not a Candidate for Radiation Therapy Disease Classification: Occult Primary Current Disease Status: Local Recurrence AJCC T Category: T0 AJCC N Category: cN2 AJCC M Category: M0 AJCC Stage Grouping: IVA Is patient a candidate for radiation therapy<= No Intent of Therapy: Non-Curative / Palliative Intent, Discussed with Patient

## 2018-04-05 DIAGNOSIS — G893 Neoplasm related pain (acute) (chronic): Secondary | ICD-10-CM | POA: Diagnosis not present

## 2018-04-05 DIAGNOSIS — I10 Essential (primary) hypertension: Secondary | ICD-10-CM | POA: Diagnosis not present

## 2018-04-06 NOTE — Progress Notes (Signed)
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  INTERVAL HISTORY: Patient returns to clinic today for further evaluation and consideration of cycle 1 of nivolumab.  She continues to have increased weakness and fatigue, poor appetite, and decreased performance status.  She continues to have persistent pain.  She has no neurologic complaints.  She denies any recent fevers. She has no chest pain or shortness of breath.  She has intermittent nausea and vomiting, but denies constipation or diarrhea.  She has no melena or hematochezia.  She has no urinary complaints.  Patient offers no further specific complaints today.  REVIEW OF SYSTEMS:   Review of Systems  Constitutional: Positive for malaise/fatigue. Negative for fever and weight loss.  HENT: Negative.  Negative for sore throat.   Respiratory: Negative.  Negative for cough, shortness of breath and stridor.   Cardiovascular: Negative.  Negative for chest pain and leg swelling.  Gastrointestinal: Positive for nausea and vomiting. Negative for abdominal pain and constipation.  Genitourinary: Negative.  Negative for dysuria.  Musculoskeletal: Positive for joint pain. Negative for falls.  Skin: Negative.  Negative for rash.  Neurological: Positive for weakness. Negative for sensory change, focal weakness and headaches.  Psychiatric/Behavioral: Positive for depression and memory loss. The patient is nervous/anxious. The patient does not have insomnia.     As per HPI. Otherwise, a complete review of systems is negative.  PAST MEDICAL HISTORY: Past Medical History:  Diagnosis Date  . Adenocarcinoma of colon ([place])   . Benign neoplasm of ascending colon    . Essential hypertension 06/13/2014  . Goals of care, counseling/discussion 07/14/2017  . HLD (hyperlipidemia) 06/13/2014  . Hypercholesteremia   . Hyperlipidemia   . Multiple thyroid nodules 01/22/2012  . Osteoporosis   . Polycythemia, secondary 12/26/2014  . Pure hypercholesterolemia 03/15/2015  . Thyroid nodule     PAST SURGICAL HISTORY: Past Surgical History:  Procedure Laterality Date  . ABDOMINAL HYSTERECTOMY    . COLONOSCOPY WITH PROPOFOL N/A 07/02/2017   Procedure: COLONOSCOPY WITH PROPOFOL;  Surgeon: Lucilla Lame, MD;  Location: [place];  Service: Endoscopy;  Laterality: N/A;  . ORIF ANKLE FRACTURE Left 08/08/2017   Procedure: OPEN REDUCTION INTERNAL FIXATION (ORIF) ANKLE FRACTURE;  Surgeon: Dereck Leep, MD;  Location: [facility];  Service: Orthopedics;  Laterality: Left;  . POLYPECTOMY N/A 07/02/2017   Procedure: POLYPECTOMY;  Surgeon: Lucilla Lame, MD;  Location: [place];  Service: Endoscopy;  Laterality: N/A;  . WRIST FRACTURE SURGERY  08/2009   badly broken    FAMILY HISTORY Family History  Problem Relation Age of Onset  . Heart disease Mother   . Diabetes Father        ADVANCED DIRECTIVES:    HEALTH MAINTENANCE: Social History   Tobacco Use  . Smoking status: Former Smoker    Packs/day: 0.25    Types: Cigarettes    Last attempt to quit: 06/17/2017    Years since quitting: 0.8  . Smokeless tobacco: Never Used  Substance Use Topics  . Alcohol use: No  . Drug use: No     Allergies  Allergen Reactions  . Potassium Nausea Only and Nausea And Vomiting  . Other Other (See Comments) and Rash    TDAP tdap TDAP  .  Tetanus Antitoxin Rash    Current Outpatient Medications  Medication Sig Dispense Refill  . amLODipine (NORVASC) 5 MG tablet Take 5 mg by mouth daily.     . calcium carbonate (OSCAL) 1500 (600 Ca) MG TABS tablet Take 600 mg of elemental calcium by mouth daily with breakfast.    . ENSURE (ENSURE) Take 237 mLs by  mouth.    . fentaNYL (DURAGESIC - DOSED MCG/HR) 12 MCG/HR Place 1 patch (12.5 mcg total) onto the skin every 3 (three) days. 10 patch 0  . pravastatin (PRAVACHOL) 10 MG tablet Take 10 mg by mouth daily.    Marland Kitchen acetaminophen (TYLENOL) 500 MG tablet Take 1,000 mg by mouth every 4 (four) hours as needed.     Marland Kitchen HYDROcodone-acetaminophen (NORCO) 5-325 MG tablet Take 1 tablet by mouth every 6 (six) hours as needed for moderate pain. (Patient not taking: Reported on 04/08/2018) 30 tablet 0  . ibuprofen (ADVIL,MOTRIN) 200 MG tablet Take 400 mg by mouth every 4 (four) hours as needed.     No current facility-administered medications for this visit.    Facility-Administered Medications Ordered in Other Visits  Medication Dose Route Frequency Provider Last Rate Last Dose  . heparin lock flush 100 unit/mL  250 Units Intracatheter PRN Leia Alf, MD      . heparin lock flush 100 unit/mL  500 Units Intracatheter PRN Leia Alf, MD      . potassium chloride 20 mEq in sodium chloride 0.9 % 500 mL injection   Intravenous Once Tye Savoy, Doni H, RN      . sodium chloride 0.9 % injection 10 mL  10 mL Intracatheter PRN Leia Alf, MD      . sodium chloride 0.9 % injection 10 mL  10 mL Intracatheter PRN Leia Alf, MD      . sodium chloride 0.9 % injection 10 mL  10 mL Intracatheter PRN Ma Hillock, Sandeep, MD      . sodium chloride 0.9 % injection 10 mL  10 mL Intracatheter PRN Leia Alf, MD      -year-old  OBJECTIVE: Vitals:   04/08/18 0850  BP: 102/65  Pulse: (!) 120  Resp: 20  Temp: 97.6 F (36.4 C)     Body mass index is 18.98 kg/m.    ECOG FS:3 - Symptomatic, >50% confined to bed  General: Ill-appearing, sitting in wheelchair. Eyes: Pink conjunctiva, anicteric sclera. HEENT: Persistent disease in the neck appears slightly worse than 1 week prior. Lungs: Clear to auscultation bilaterally. Heart: Regular rate and rhythm. No rubs, murmurs, or gallops. Abdomen: Soft, nontender,  nondistended. No organomegaly noted, normoactive bowel sounds. Musculoskeletal: No edema, cyanosis, or clubbing. Neuro: Alert, answering all questions appropriately. Cranial nerves grossly intact. Skin: No rashes or petechiae noted. Psych: Flat affect.  LAB RESULTS:  Lab Results  Component Value Date   NA 132 (L) 04/08/2018   K 4.0 04/08/2018   CL 99 04/08/2018   CO2 21 (L) 04/08/2018   GLUCOSE 148 (H) 04/08/2018   BUN 18 04/08/2018   CREATININE 0.75 04/08/2018   CALCIUM 9.2 04/08/2018   PROT 7.3 04/08/2018   ALBUMIN 3.1 (L) 04/08/2018   AST 30 04/08/2018   ALT 14 04/08/2018   ALKPHOS 144 (H) 04/08/2018   BILITOT 0.2 (L) 04/08/2018   GFRNONAA >60 04/08/2018   GFRAA >60 04/08/2018    Lab Results  Component Value Date   WBC 9.4 04/08/2018   NEUTROABS 7.9 (H) 04/08/2018   HGB 13.4 04/08/2018   HCT 39.8 04/08/2018  MCV 82.5 04/08/2018   PLT 472 (H) 04/08/2018     STUDIES: No results found.  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  PLAN:    1.  Recurrent, progressive stage IVa head and neck squamous cell carcinoma: Despite recent surgery at [facility] for residual disease, patient continues to have progressive malignancy.  Her performance status is significantly decreased.  Previously we discussed at length the possibility of enrolling in hospice, but patient ultimately decided she would like to try immunotherapy as one last effort for control of disease.  Patient will receive nivolumab every 2 weeks until intolerable side effects or progression of disease.  She expressed understanding that this is likely not curative.  Proceed with cycle 1 today.  Return to clinic in 2 weeks for further evaluation and consideration of cycle 2.   2.  Adenocarcinoma of the colon: No intervention is planned at this time.  Patient could not tolerate any further surgery.  Repeat CEA is pending at time of dictation. 3.  Fractured  ankles: Continue follow-up and treatment per orthopedics. 4.  Declining performance status: Likely multifactorial.  Chronic and unchanged.  Continue home health and physical therapy.  Consider referral back to dietary in the near future.  Patient has declined hospice as above. 5.  Nausea: Continue current medications as prescribed. 6.  Pain: Continue current narcotic regimen. 7.  Hypotension/tachycardia: Patient received an extra liter of IV fluids today.    [name], MD 04/08/18 5:45 PM

## 2018-04-08 ENCOUNTER — Other Ambulatory Visit: Payer: Self-pay

## 2018-04-08 ENCOUNTER — Inpatient Hospital Stay (HOSPITAL_BASED_OUTPATIENT_CLINIC_OR_DEPARTMENT_OTHER): Payer: PPO | Admitting: Oncology

## 2018-04-08 ENCOUNTER — Inpatient Hospital Stay: Payer: PPO

## 2018-04-08 VITALS — BP 102/65 | HR 120 | Temp 97.6°F | Resp 20 | Wt 110.6 lb

## 2018-04-08 DIAGNOSIS — I959 Hypotension, unspecified: Secondary | ICD-10-CM | POA: Diagnosis not present

## 2018-04-08 DIAGNOSIS — R531 Weakness: Secondary | ICD-10-CM

## 2018-04-08 DIAGNOSIS — C760 Malignant neoplasm of head, face and neck: Secondary | ICD-10-CM | POA: Diagnosis not present

## 2018-04-08 DIAGNOSIS — R112 Nausea with vomiting, unspecified: Secondary | ICD-10-CM

## 2018-04-08 DIAGNOSIS — F419 Anxiety disorder, unspecified: Secondary | ICD-10-CM | POA: Diagnosis not present

## 2018-04-08 DIAGNOSIS — E860 Dehydration: Secondary | ICD-10-CM

## 2018-04-08 DIAGNOSIS — Z87891 Personal history of nicotine dependence: Secondary | ICD-10-CM

## 2018-04-08 DIAGNOSIS — R5383 Other fatigue: Secondary | ICD-10-CM

## 2018-04-08 DIAGNOSIS — R000 Tachycardia, unspecified: Secondary | ICD-10-CM | POA: Diagnosis not present

## 2018-04-08 DIAGNOSIS — M2550 Pain in unspecified joint: Secondary | ICD-10-CM

## 2018-04-08 DIAGNOSIS — C189 Malignant neoplasm of colon, unspecified: Secondary | ICD-10-CM

## 2018-04-08 DIAGNOSIS — E041 Nontoxic single thyroid nodule: Secondary | ICD-10-CM | POA: Diagnosis not present

## 2018-04-08 DIAGNOSIS — I10 Essential (primary) hypertension: Secondary | ICD-10-CM | POA: Diagnosis not present

## 2018-04-08 DIAGNOSIS — R630 Anorexia: Secondary | ICD-10-CM

## 2018-04-08 DIAGNOSIS — R413 Other amnesia: Secondary | ICD-10-CM

## 2018-04-08 DIAGNOSIS — Z8781 Personal history of (healed) traumatic fracture: Secondary | ICD-10-CM

## 2018-04-08 DIAGNOSIS — Z5112 Encounter for antineoplastic immunotherapy: Secondary | ICD-10-CM | POA: Diagnosis not present

## 2018-04-08 LAB — CBC WITH DIFFERENTIAL/PLATELET
BASOS ABS: 0 10*3/uL (ref 0–0.1)
Basophils Relative: 0 %
EOS ABS: 0 10*3/uL (ref 0–0.7)
EOS PCT: 1 %
HCT: 39.8 % (ref 35.0–47.0)
Hemoglobin: 13.4 g/dL (ref 12.0–16.0)
LYMPHS ABS: 1 10*3/uL (ref 1.0–3.6)
LYMPHS PCT: 11 %
MCH: 27.7 pg (ref 26.0–34.0)
MCHC: 33.6 g/dL (ref 32.0–36.0)
MCV: 82.5 fL (ref 80.0–100.0)
MONO ABS: 0.4 10*3/uL (ref 0.2–0.9)
Monocytes Relative: 4 %
Neutro Abs: 7.9 10*3/uL — ABNORMAL HIGH (ref 1.4–6.5)
Neutrophils Relative %: 84 %
PLATELETS: 472 10*3/uL — AB (ref 150–440)
RBC: 4.82 MIL/uL (ref 3.80–5.20)
RDW: 17.2 % — AB (ref 11.5–14.5)
WBC: 9.4 10*3/uL (ref 3.6–11.0)

## 2018-04-08 LAB — COMPREHENSIVE METABOLIC PANEL
ALBUMIN: 3.1 g/dL — AB (ref 3.5–5.0)
ALT: 14 U/L (ref 0–44)
AST: 30 U/L (ref 15–41)
Alkaline Phosphatase: 144 U/L — ABNORMAL HIGH (ref 38–126)
Anion gap: 12 (ref 5–15)
BUN: 18 mg/dL (ref 8–23)
CO2: 21 mmol/L — ABNORMAL LOW (ref 22–32)
CREATININE: 0.75 mg/dL (ref 0.44–1.00)
Calcium: 9.2 mg/dL (ref 8.9–10.3)
Chloride: 99 mmol/L (ref 98–111)
GFR calc Af Amer: >60 mL/min (ref >60)
GFR calc non Af Amer: >60 mL/min (ref >60)
GLUCOSE: 148 mg/dL — AB (ref 70–99)
POTASSIUM: 4 mmol/L (ref 3.5–5.1)
Sodium: 132 mmol/L — ABNORMAL LOW (ref 135–145)
TOTAL PROTEIN: 7.3 g/dL (ref 6.5–8.1)
Total Bilirubin: 0.2 mg/dL — ABNORMAL LOW (ref 0.3–1.2)

## 2018-04-08 MED ORDER — SODIUM CHLORIDE 0.9 % IV SOLN
Freq: Once | INTRAVENOUS | Status: AC
  Administered 2018-04-08: 10:00:00 via INTRAVENOUS
  Filled 2018-04-08: qty 250

## 2018-04-08 MED ORDER — SODIUM CHLORIDE 0.9 % IV SOLN
240.0000 mg | Freq: Once | INTRAVENOUS | Status: AC
  Administered 2018-04-08: 240 mg via INTRAVENOUS
  Filled 2018-04-08: qty 24

## 2018-04-08 NOTE — Progress Notes (Signed)
Here for follow up. Decided to proceed w immunotherapy. Per husband [place] -cleaning out pt mouth 2-3 times per day w a spay [employer] - stated that tissue on tongue ,roof of mouth,back of throat -removed and and pt vomits intermittently -once every 1-2 days. Nausea meds helps ( Dr Candiss Norse gave them-husband cant recall which works best )  Pain today (6 ) on r side of neck where tumor removed.

## 2018-04-09 LAB — CEA: CEA: 2.5 ng/mL (ref 0.0–4.7)

## 2018-04-09 LAB — THYROID PANEL WITH TSH
FREE THYROXINE INDEX: 2.2 (ref 1.2–4.9)
T3 UPTAKE RATIO: 27 % (ref 24–39)
T4 TOTAL: 8 ug/dL (ref 4.5–12.0)
TSH: 2.54 u[IU]/mL (ref 0.450–4.500)

## 2018-04-14 ENCOUNTER — Encounter: Payer: Self-pay | Admitting: Surgery

## 2018-04-14 ENCOUNTER — Ambulatory Visit (INDEPENDENT_AMBULATORY_CARE_PROVIDER_SITE_OTHER): Payer: PPO | Admitting: Surgery

## 2018-04-14 VITALS — BP 109/73 | HR 121 | Temp 97.2°F | Ht 64.0 in | Wt 109.8 lb

## 2018-04-14 DIAGNOSIS — C182 Malignant neoplasm of ascending colon: Secondary | ICD-10-CM

## 2018-04-14 NOTE — Progress Notes (Signed)
Outpatient Surgical Follow Up  04/14/2018  Dana Perez is an 77 y.o. female.   Chief Complaint  Patient presents with  . Follow-up    adenocarcinoma    HPI: Dana Perez is a 77 year old female with a history of metastatic neck cancer with an incidental adenocarcinoma within the polyp on the right colon.  She has been receiving chemotherapy and is status post neck dissection with glossectomy.  Unfortunately her disease has progressed and she continues to be very weak, failure to thrive and there is still continuing gastric feeds.  She now has started on immunotherapy because her disease from a metastatic squamous cell has progressed. No fevers, + chills.   Past Medical History:  Diagnosis Date  . Adenocarcinoma of colon ([place])   . Benign neoplasm of ascending colon   . Essential hypertension 06/13/2014  . Goals of care, counseling/discussion 07/14/2017  . HLD (hyperlipidemia) 06/13/2014  . Hypercholesteremia   . Hyperlipidemia   . Multiple thyroid nodules 01/22/2012  . Osteoporosis   . Polycythemia, secondary 12/26/2014  . Pure hypercholesterolemia 03/15/2015  . Thyroid nodule     Past Surgical History:  Procedure Laterality Date  . ABDOMINAL HYSTERECTOMY    . COLONOSCOPY WITH PROPOFOL N/A 07/02/2017   Procedure: COLONOSCOPY WITH PROPOFOL;  Surgeon: Lucilla Lame, MD;  Location: [place];  Service: Endoscopy;  Laterality: N/A;  . ORIF ANKLE FRACTURE Left 08/08/2017   Procedure: OPEN REDUCTION INTERNAL FIXATION (ORIF) ANKLE FRACTURE;  Surgeon: Dereck Leep, MD;  Location: [facility];  Service: Orthopedics;  Laterality: Left;  . POLYPECTOMY N/A 07/02/2017   Procedure: POLYPECTOMY;  Surgeon: Lucilla Lame, MD;  Location: [place];  Service: Endoscopy;  Laterality: N/A;  . WRIST FRACTURE SURGERY  08/2009   badly broken    Family History  Problem Relation Age of Onset  . Heart disease Mother   . Diabetes Father     Social History:  reports that she quit  smoking about 9 months ago. Her smoking use included cigarettes. She smoked 0.25 packs per day. She has never used smokeless tobacco. She reports that she does not drink alcohol or use drugs.  Allergies:  Allergies  Allergen Reactions  . Potassium Nausea Only and Nausea And Vomiting  . Other Other (See Comments) and Rash    TDAP tdap TDAP  . Tetanus Antitoxin Rash    Medications reviewed.    ROS Full ROS performed and is otherwise negative other than what is stated in HPI   BP 109/73   Pulse (!) 121   Temp (!) 97.2 F (36.2 C) (Temporal)   Ht 5\' 4"  (1.626 m)   Wt 109 lb 12.8 oz (49.8 kg)   BMI 18.85 kg/m   Physical Exam  Constitutional: She is oriented to person, place, and time.  Debilitated, malnourished in a wheelchair  HENT:  Head: Normocephalic and atraumatic.  Neck: No JVD present. No thyromegaly present.  Large submental mass extending to the neck. Had and fixed  Pulmonary/Chest: Effort normal. No respiratory distress.  Abdominal: Soft. She exhibits no distension and no mass. There is no tenderness. There is no rebound and no guarding.  PEG tube in place, no peritonitis  Lymphadenopathy:    She has cervical adenopathy.  Neurological: She is alert and oriented to person, place, and time. No cranial nerve deficit.  Skin: Skin is warm. She is diaphoretic.  Psychiatric: She has a normal mood and affect. Her behavior is normal. Judgment and thought content normal.  Nursing note  and vitals reviewed.    Assessment/Plan: 77 year old female with metastatic squamous cell carcinoma of the head and neck with progression of her disease.  He does have an additional incidental adenocarcinoma of the right colon.  The patient currently its doing immunological therapy.  Her prognosis is very poor.  Given the fact that she continues to have worsening of her cancer and her debility and poor performance status she is not a candidate for any surgical intervention.  The family is  entertaining hospice.  We will follow her on a as needed basis   Caroleen Hamman, MD [place] Surgeon

## 2018-04-14 NOTE — Patient Instructions (Signed)
Please call our office if you have questions or concerns.   

## 2018-04-19 NOTE — Progress Notes (Signed)
[place]  [phone] Fax:(336) [phone]  ID: ERIYANA SWEETEN OB: 05-05-1941  MR#: [id number]  [id number]#:[id number]  Patient Care Team: Glendon Axe, MD as PCP - General (Internal Medicine)  CHIEF COMPLAINT:  Recurrent, progressive stage IVa head and neck squamous cell carcinoma, adenocarcinoma of the colon.  INTERVAL HISTORY: Patient returns to clinic today for further evaluation and consideration of cycle 2 of nivolumab.  She tolerated her first treatment well.  Much of the history is given by her husband given her decreased performance status.  She continues to have increased weakness and fatigue, poor appetite, and decreased performance status.  She has been having hallucinations and sees "monsters" periodically throughout the day and night.  She has 2 new mildly raised white areas on her skin near her PEG tube and on her right shoulder.  These are not pruritic or erythematous.  She continues to have persistent pain.  She has no neurologic complaints.  She denies any recent fevers. She has no chest pain or shortness of breath.  She has intermittent nausea and vomiting, but denies constipation or diarrhea.  She has no melena or hematochezia.  She has no urinary complaints.  Patient feels generally terrible, but offers no further specific complaints.  REVIEW OF SYSTEMS:   Review of Systems  Constitutional: Positive for malaise/fatigue. Negative for fever and weight loss.  HENT: Negative.  Negative for sore throat.   Respiratory: Negative.  Negative for cough, shortness of breath and stridor.   Cardiovascular: Negative.  Negative for chest pain and leg swelling.  Gastrointestinal: Positive for nausea and vomiting. Negative for abdominal pain and constipation.  Genitourinary: Negative.  Negative for dysuria.  Musculoskeletal: Positive for joint pain. Negative for falls.  Skin: Negative.  Negative for itching and rash.  Neurological: Positive for weakness.  Negative for sensory change, focal weakness and headaches.  Psychiatric/Behavioral: Positive for depression, hallucinations and memory loss. The patient is nervous/anxious. The patient does not have insomnia.     As per HPI. Otherwise, a complete review of systems is negative.  PAST MEDICAL HISTORY: Past Medical History:  Diagnosis Date  . Adenocarcinoma of colon ([place])   . Benign neoplasm of ascending colon   . Essential hypertension 06/13/2014  . Goals of care, counseling/discussion 07/14/2017  . HLD (hyperlipidemia) 06/13/2014  . Hypercholesteremia   . Hyperlipidemia   . Multiple thyroid nodules 01/22/2012  . Osteoporosis   . Polycythemia, secondary 12/26/2014  . Pure hypercholesterolemia 03/15/2015  . Thyroid nodule     PAST SURGICAL HISTORY: Past Surgical History:  Procedure Laterality Date  . ABDOMINAL HYSTERECTOMY    . COLONOSCOPY WITH PROPOFOL N/A 07/02/2017   Procedure: COLONOSCOPY WITH PROPOFOL;  Surgeon: Lucilla Lame, MD;  Location: [facility];  Service: Endoscopy;  Laterality: N/A;  . ORIF ANKLE FRACTURE Left 08/08/2017   Procedure: OPEN REDUCTION INTERNAL FIXATION (ORIF) ANKLE FRACTURE;  Surgeon: Dereck Leep, MD;  Location: [facility];  Service: Orthopedics;  Laterality: Left;  . POLYPECTOMY N/A 07/02/2017   Procedure: POLYPECTOMY;  Surgeon: Lucilla Lame, MD;  Location: [facility];  Service: Endoscopy;  Laterality: N/A;  . WRIST FRACTURE SURGERY  08/2009   badly broken    FAMILY HISTORY Family History  Problem Relation Age of Onset  . Heart disease Mother   . Diabetes Father        ADVANCED DIRECTIVES:    HEALTH MAINTENANCE: Social History   Tobacco Use  . Smoking status: Former Smoker    Packs/day: 0.25  Types: Cigarettes    Last attempt to quit: 06/17/2017    Years since quitting: 0.8  . Smokeless tobacco: Never Used  Substance Use Topics  . Alcohol use: No  . Drug use: No     Allergies  Allergen Reactions  . Potassium  Nausea Only and Nausea And Vomiting  . Other Other (See Comments) and Rash    TDAP tdap TDAP  . Tetanus Antitoxin Rash    Current Outpatient Medications  Medication Sig Dispense Refill  . amLODipine (NORVASC) 5 MG tablet Take 5 mg by mouth daily.     . calcium carbonate (OSCAL) 1500 (600 Ca) MG TABS tablet Take 600 mg of elemental calcium by mouth daily with breakfast.    . ENSURE (ENSURE) Take 237 mLs by mouth.    . fentaNYL (DURAGESIC - DOSED MCG/HR) 25 MCG/HR patch PLACE 1 PATCH ONTO THE SKIN EVERY THIRD DAY. REMOVE OLD PATCH BEFORE PLACING NEW PATCH  0  . ibuprofen (ADVIL,MOTRIN) 200 MG tablet Take 400 mg by mouth every 4 (four) hours as needed.    . pravastatin (PRAVACHOL) 10 MG tablet Take 10 mg by mouth daily.    . promethazine (PHENERGAN) 25 MG tablet Take 25 mg by mouth every 6 (six) hours as needed for nausea or vomiting.     No current facility-administered medications for this visit.    Facility-Administered Medications Ordered in Other Visits  Medication Dose Route Frequency Provider Last Rate Last Dose  . heparin lock flush 100 unit/mL  250 Units Intracatheter PRN Leia Alf, MD      . heparin lock flush 100 unit/mL  500 Units Intracatheter PRN Leia Alf, MD      . potassium chloride 20 mEq in sodium chloride 0.9 % 500 mL injection   Intravenous Once Tye Savoy, Doni H, RN      . sodium chloride 0.9 % injection 10 mL  10 mL Intracatheter PRN Leia Alf, MD      . sodium chloride 0.9 % injection 10 mL  10 mL Intracatheter PRN Ma Hillock, Sandeep, MD      . sodium chloride 0.9 % injection 10 mL  10 mL Intracatheter PRN Ma Hillock, Sandeep, MD      . sodium chloride 0.9 % injection 10 mL  10 mL Intracatheter PRN Leia Alf, MD      -year-old  OBJECTIVE: Vitals:   04/22/18 0907  BP: 103/73  Pulse: (!) 111  Resp: 18  Temp: (!) 97 F (36.1 C)     Body mass index is 18.97 kg/m.    ECOG FS:3 - Symptomatic, >50% confined to bed  General: Ill-appearing, mild  distress secondary to pain.  Sitting in a wheelchair.   Eyes: Pink conjunctiva, anicteric sclera. HEENT: Persistent disease unchanged. Lungs: Clear to auscultation bilaterally. Heart: Regular rate and rhythm. No rubs, murmurs, or gallops. Abdomen: Soft, nontender, nondistended. No organomegaly noted, normoactive bowel sounds.  PEG tube noted without erythema. Musculoskeletal: No edema, cyanosis, or clubbing. Neuro: Alert, mildly confused.  Cranial nerves grossly intact. Skin: 2 areas of slightly raised white plaques on abdomen and shoulder, no erythema noted. Psych: Flat affect.  LAB RESULTS:  Lab Results  Component Value Date   NA 134 (L) 04/22/2018   K 4.0 04/22/2018   CL 102 04/22/2018   CO2 20 (L) 04/22/2018   GLUCOSE 151 (H) 04/22/2018   BUN 20 04/22/2018   CREATININE 0.80 04/22/2018   CALCIUM 9.1 04/22/2018   PROT 7.0 04/22/2018   ALBUMIN 3.1 (L) 04/22/2018  AST 34 04/22/2018   ALT 18 04/22/2018   ALKPHOS 127 (H) 04/22/2018   BILITOT 0.2 (L) 04/22/2018   GFRNONAA >60 04/22/2018   GFRAA >60 04/22/2018    Lab Results  Component Value Date   WBC 9.7 04/22/2018   NEUTROABS 8.1 (H) 04/22/2018   HGB 13.6 04/22/2018   HCT 41.6 04/22/2018   MCV 81.8 04/22/2018   PLT 447 (H) 04/22/2018     STUDIES: No results found.  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  PLAN:    1.  Recurrent, progressive stage IVa head and neck squamous cell carcinoma: Despite recent surgery at [facility] for residual disease, patient continues to have progressive malignancy.  Her performance status is significantly decreased.  Previously we discussed at length the possibility of enrolling in hospice, but patient ultimately decided she would like to try immunotherapy as one last effort for control of disease.  Patient will receive nivolumab every 2 weeks until intolerable side effects or progression of disease.  She expressed  understanding that this is likely not curative.  Proceed with cycle 2 of nivolumab.  Return to clinic in 2 weeks for further evaluation and consideration of cycle 3.   2.  Adenocarcinoma of the colon: No intervention is planned at this time.  Patient could not tolerate any further surgery.  CEA continues to be within normal limits at 2.6. 3.  Fractured ankles: Continue follow-up and treatment per orthopedics. 4.  Declining performance status: Likely multifactorial.  Chronic and unchanged.  Continue home health and physical therapy.  Consider referral back to dietary in the near future.  Patient has declined hospice as above. 5.  Nausea: Continue current medications as prescribed. 6.  Pain: Continue current narcotic regimen. 7.  Hypotension/tachycardia: Patient will receive 1 L of extra IV fluids with the treatment. 8.  Hallucinations: Unclear etiology, but likely medication induced.  Monitor. 9.  Skin plaques: Unclear etiology.  Monitor.    [name], MD 04/24/18 7:11 AM

## 2018-04-22 ENCOUNTER — Inpatient Hospital Stay: Payer: PPO | Attending: Oncology

## 2018-04-22 ENCOUNTER — Other Ambulatory Visit: Payer: Self-pay

## 2018-04-22 ENCOUNTER — Inpatient Hospital Stay (HOSPITAL_BASED_OUTPATIENT_CLINIC_OR_DEPARTMENT_OTHER): Payer: PPO | Admitting: Oncology

## 2018-04-22 ENCOUNTER — Inpatient Hospital Stay: Payer: PPO

## 2018-04-22 VITALS — BP 103/73 | HR 111 | Temp 97.0°F | Resp 18 | Wt 110.5 lb

## 2018-04-22 DIAGNOSIS — C760 Malignant neoplasm of head, face and neck: Secondary | ICD-10-CM

## 2018-04-22 DIAGNOSIS — Z8781 Personal history of (healed) traumatic fracture: Secondary | ICD-10-CM

## 2018-04-22 DIAGNOSIS — C189 Malignant neoplasm of colon, unspecified: Secondary | ICD-10-CM | POA: Insufficient documentation

## 2018-04-22 DIAGNOSIS — Z931 Gastrostomy status: Secondary | ICD-10-CM

## 2018-04-22 DIAGNOSIS — I10 Essential (primary) hypertension: Secondary | ICD-10-CM

## 2018-04-22 DIAGNOSIS — R443 Hallucinations, unspecified: Secondary | ICD-10-CM

## 2018-04-22 DIAGNOSIS — Z79899 Other long term (current) drug therapy: Secondary | ICD-10-CM | POA: Insufficient documentation

## 2018-04-22 DIAGNOSIS — R110 Nausea: Secondary | ICD-10-CM

## 2018-04-22 DIAGNOSIS — Z87891 Personal history of nicotine dependence: Secondary | ICD-10-CM

## 2018-04-22 DIAGNOSIS — R000 Tachycardia, unspecified: Secondary | ICD-10-CM | POA: Diagnosis not present

## 2018-04-22 DIAGNOSIS — I959 Hypotension, unspecified: Secondary | ICD-10-CM

## 2018-04-22 DIAGNOSIS — Z5112 Encounter for antineoplastic immunotherapy: Secondary | ICD-10-CM | POA: Diagnosis not present

## 2018-04-22 LAB — COMPREHENSIVE METABOLIC PANEL
ALT: 18 U/L (ref 0–44)
AST: 34 U/L (ref 15–41)
Albumin: 3.1 g/dL — ABNORMAL LOW (ref 3.5–5.0)
Alkaline Phosphatase: 127 U/L — ABNORMAL HIGH (ref 38–126)
Anion gap: 12 (ref 5–15)
BILIRUBIN TOTAL: 0.2 mg/dL — AB (ref 0.3–1.2)
BUN: 20 mg/dL (ref 8–23)
CO2: 20 mmol/L — ABNORMAL LOW (ref 22–32)
CREATININE: 0.8 mg/dL (ref 0.44–1.00)
Calcium: 9.1 mg/dL (ref 8.9–10.3)
Chloride: 102 mmol/L (ref 98–111)
GFR calc Af Amer: >60 mL/min (ref >60)
Glucose, Bld: 151 mg/dL — ABNORMAL HIGH (ref 70–99)
Potassium: 4 mmol/L (ref 3.5–5.1)
Sodium: 134 mmol/L — ABNORMAL LOW (ref 135–145)
TOTAL PROTEIN: 7 g/dL (ref 6.5–8.1)

## 2018-04-22 LAB — CBC WITH DIFFERENTIAL/PLATELET
BASOS ABS: 0 10*3/uL (ref 0–0.1)
BASOS PCT: 0 %
EOS ABS: 0.1 10*3/uL (ref 0–0.7)
EOS PCT: 1 %
HCT: 41.6 % (ref 35.0–47.0)
Hemoglobin: 13.6 g/dL (ref 12.0–16.0)
Lymphocytes Relative: 12 %
Lymphs Abs: 1.1 10*3/uL (ref 1.0–3.6)
MCH: 26.8 pg (ref 26.0–34.0)
MCHC: 32.8 g/dL (ref 32.0–36.0)
MCV: 81.8 fL (ref 80.0–100.0)
Monocytes Absolute: 0.3 10*3/uL (ref 0.2–0.9)
Monocytes Relative: 4 %
Neutro Abs: 8.1 10*3/uL — ABNORMAL HIGH (ref 1.4–6.5)
Neutrophils Relative %: 83 %
PLATELETS: 447 10*3/uL — AB (ref 150–440)
RBC: 5.09 MIL/uL (ref 3.80–5.20)
RDW: 17.3 % — ABNORMAL HIGH (ref 11.5–14.5)
WBC: 9.7 10*3/uL (ref 3.6–11.0)

## 2018-04-22 MED ORDER — SODIUM CHLORIDE 0.9 % IV SOLN
240.0000 mg | Freq: Once | INTRAVENOUS | Status: AC
  Administered 2018-04-22: 240 mg via INTRAVENOUS
  Filled 2018-04-22: qty 24

## 2018-04-22 MED ORDER — SODIUM CHLORIDE 0.9 % IV SOLN
Freq: Once | INTRAVENOUS | Status: AC
  Administered 2018-04-22: 10:00:00 via INTRAVENOUS
  Filled 2018-04-22: qty 250

## 2018-04-22 NOTE — Progress Notes (Signed)
Here for follow up. Per husband showed staff area on r abd and R shoulder -pink around raised white area -per  husband  these areas have been there 2 weeks growing in size. No titchy per pt. No discomfort per pt Per husband feeding tube L abd area appears to be loose -Tillie Rung to look at surgeon in [place] and stated she will notify husband -husband to follow up. Agreed to plan. Per husband pt having night mares-saw " monsters and fearful " this is on going issue.  Mouth sores continue- w husband putting biotine on it -helpful he stated.

## 2018-04-23 DIAGNOSIS — C760 Malignant neoplasm of head, face and neck: Secondary | ICD-10-CM | POA: Diagnosis not present

## 2018-04-23 LAB — CEA: CEA1: 2.6 ng/mL (ref 0.0–4.7)

## 2018-04-23 LAB — THYROID PANEL WITH TSH
Free Thyroxine Index: 2.3 (ref 1.2–4.9)
T3 Uptake Ratio: 26 % (ref 24–39)
T4 TOTAL: 8.8 ug/dL (ref 4.5–12.0)
TSH: 3.01 u[IU]/mL (ref 0.450–4.500)

## 2018-05-02 NOTE — Progress Notes (Signed)
[place]  [phone]) [phone] Fax:(336) [phone]  ID: Dana Perez OB: 04/30/1941  MR#: [id number]  JJH#:[id number]  Patient Care Team: Glendon Axe, MD as PCP - General (Internal Medicine) [name], MD as Referring Physician (Surgery)  CHIEF COMPLAINT:  Recurrent, progressive stage IVa head and neck squamous cell carcinoma, adenocarcinoma of the colon.  INTERVAL HISTORY: Patient returns to clinic today for further evaluation and consideration of cycle 3 of nivolumab.  Entire history is given by her husband.  She continues to have significantly decreased performance status.  She has a poor appetite.  The mass on her neck has become necrotic and husband states has had a post episodes of bleeding.  She continues to have hallucinations periodically throughout the day and night. She continues to have persistent pain.  She has no neurologic complaints.  She denies any recent fevers. She has no chest pain or shortness of breath.  She has intermittent nausea and vomiting, but denies constipation or diarrhea.  She has no melena or hematochezia.  She has no urinary complaints.  Patient generally feels terrible, but offers no further specific complaints today.  REVIEW OF SYSTEMS:   Review of Systems  Constitutional: Positive for malaise/fatigue. Negative for fever and weight loss.  HENT: Negative.  Negative for sore throat.   Respiratory: Negative.  Negative for cough, shortness of breath and stridor.   Cardiovascular: Negative.  Negative for chest pain and leg swelling.  Gastrointestinal: Positive for nausea and vomiting. Negative for abdominal pain and constipation.  Genitourinary: Negative.  Negative for dysuria.  Musculoskeletal: Positive for joint pain. Negative for falls.  Skin: Negative.  Negative for itching and rash.  Neurological: Positive for weakness. Negative for sensory change, focal weakness and headaches.  Psychiatric/Behavioral: Positive for  depression, hallucinations and memory loss. The patient is nervous/anxious. The patient does not have insomnia.     As per HPI. Otherwise, a complete review of systems is negative.  PAST MEDICAL HISTORY: Past Medical History:  Diagnosis Date  . Adenocarcinoma of colon ([place])   . Benign neoplasm of ascending colon   . Essential hypertension 06/13/2014  . Goals of care, counseling/discussion 07/14/2017  . HLD (hyperlipidemia) 06/13/2014  . Hypercholesteremia   . Hyperlipidemia   . Multiple thyroid nodules 01/22/2012  . Osteoporosis   . Polycythemia, secondary 12/26/2014  . Pure hypercholesterolemia 03/15/2015  . Thyroid nodule     PAST SURGICAL HISTORY: Past Surgical History:  Procedure Laterality Date  . ABDOMINAL HYSTERECTOMY    . COLONOSCOPY WITH PROPOFOL N/A 07/02/2017   Procedure: COLONOSCOPY WITH PROPOFOL;  Surgeon: Lucilla Lame, MD;  Location: [facility];  Service: Endoscopy;  Laterality: N/A;  . ORIF ANKLE FRACTURE Left 08/08/2017   Procedure: OPEN REDUCTION INTERNAL FIXATION (ORIF) ANKLE FRACTURE;  Surgeon: Dereck Leep, MD;  Location: [facility];  Service: Orthopedics;  Laterality: Left;  . POLYPECTOMY N/A 07/02/2017   Procedure: POLYPECTOMY;  Surgeon: Lucilla Lame, MD;  Location: [facility];  Service: Endoscopy;  Laterality: N/A;  . WRIST FRACTURE SURGERY  08/2009   badly broken    FAMILY HISTORY Family History  Problem Relation Age of Onset  . Heart disease Mother   . Diabetes Father        ADVANCED DIRECTIVES:    HEALTH MAINTENANCE: Social History   Tobacco Use  . Smoking status: Former Smoker    Packs/day: 0.25    Types: Cigarettes    Last attempt to quit: 06/17/2017    Years since quitting:  0.8  . Smokeless tobacco: Never Used  Substance Use Topics  . Alcohol use: No  . Drug use: No     Allergies  Allergen Reactions  . Potassium Nausea Only and Nausea And Vomiting  . Other Other (See Comments) and Rash    TDAP tdap TDAP    . Tetanus Antitoxin Rash    Current Outpatient Medications  Medication Sig Dispense Refill  . calcium carbonate (OSCAL) 1500 (600 Ca) MG TABS tablet Take 600 mg of elemental calcium by mouth daily with breakfast.    . ENSURE (ENSURE) Take 237 mLs by mouth.    . fentaNYL (DURAGESIC - DOSED MCG/HR) 50 MCG/HR Place onto the skin.    Marland Kitchen ibuprofen (ADVIL,MOTRIN) 200 MG tablet Take 400 mg by mouth every 4 (four) hours as needed.    . pravastatin (PRAVACHOL) 10 MG tablet Take 10 mg by mouth daily.    . promethazine (PHENERGAN) 25 MG tablet Take 25 mg by mouth every 6 (six) hours as needed for nausea or vomiting.     No current facility-administered medications for this visit.    Facility-Administered Medications Ordered in Other Visits  Medication Dose Route Frequency Provider Last Rate Last Dose  . heparin lock flush 100 unit/mL  250 Units Intracatheter PRN Leia Alf, MD      . heparin lock flush 100 unit/mL  500 Units Intracatheter PRN Leia Alf, MD      . potassium chloride 20 mEq in sodium chloride 0.9 % 500 mL injection   Intravenous Once Tye Savoy, Doni H, RN      . sodium chloride 0.9 % injection 10 mL  10 mL Intracatheter PRN Leia Alf, MD      . sodium chloride 0.9 % injection 10 mL  10 mL Intracatheter PRN Ma Hillock, Sandeep, MD      . sodium chloride 0.9 % injection 10 mL  10 mL Intracatheter PRN Ma Hillock, Sandeep, MD      . sodium chloride 0.9 % injection 10 mL  10 mL Intracatheter PRN Leia Alf, MD      -year-old  OBJECTIVE: Vitals:   05/06/18 0950  BP: 104/69  Pulse: (!) 109  Resp: 18  Temp: (!) 97 F (36.1 C)     Body mass index is 18.62 kg/m.    ECOG FS:3 - Symptomatic, >50% confined to bed  General: Ill-appearing, no acute distress.  Sitting in a wheelchair. Eyes: Pink conjunctiva, anicteric sclera. HEENT: Large necrotic appearing mass left midline left neck.  Unchanged. Lungs: Clear to auscultation bilaterally. Heart: Regular rate and rhythm. No  rubs, murmurs, or gallops. Abdomen: Soft, nontender, nondistended. No organomegaly noted, normoactive bowel sounds. Musculoskeletal: No edema, cyanosis, or clubbing. Neuro: Alert, mildly confused. Cranial nerves grossly intact. Skin: No rashes or petechiae noted. Psych: Flat affect.  LAB RESULTS:  Lab Results  Component Value Date   NA 136 05/06/2018   K 4.2 05/06/2018   CL 101 05/06/2018   CO2 28 05/06/2018   GLUCOSE 101 (H) 05/06/2018   BUN 20 05/06/2018   CREATININE 0.76 05/06/2018   CALCIUM 9.3 05/06/2018   PROT 7.1 05/06/2018   ALBUMIN 3.5 05/06/2018   AST 27 05/06/2018   ALT 15 05/06/2018   ALKPHOS 99 05/06/2018   BILITOT 0.3 05/06/2018   GFRNONAA >60 05/06/2018   GFRAA >60 05/06/2018    Lab Results  Component Value Date   WBC 6.7 05/06/2018   NEUTROABS 5.4 05/06/2018   HGB 13.5 05/06/2018   HCT 40.4 05/06/2018  MCV 81.3 05/06/2018   PLT 297 05/06/2018     STUDIES: No results found.  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  PLAN:    1.  Recurrent, progressive stage IVa head and neck squamous cell carcinoma: Despite recent surgery at [facility] for residual disease, patient continues to have progressive malignancy.  Her performance status is significantly decreased.  Previously we discussed at length the possibility of enrolling in hospice, but patient ultimately decided she would like to try immunotherapy as one last effort for control of disease.  Patient will receive nivolumab every 2 weeks until intolerable side effects or progression of disease.  She expressed understanding that this is likely not curative.  Proceed with cycle 3 of nivolumab today.  Return to clinic in 2 weeks for further evaluation and consideration of cycle 4.   2.  Adenocarcinoma of the colon: No intervention is planned at this time.  Patient could not tolerate any further surgery.  CEA continues to be within normal limits  at 2.6. 3.  Fractured ankles: Continue follow-up and treatment per orthopedics. 4.  Declining performance status: Likely multifactorial.  Chronic and unchanged.  Continue home health and physical therapy.  Consider referral back to dietary in the near future.  Patient has declined hospice as above. 5.  Nausea: Continue current medications as prescribed. 6.  Pain: Continue current narcotic regimen. 7.  Hypotension/tachycardia: Patient will receive 1 L of IV fluids with treatments. 8.  Hallucinations: Unclear etiology, but likely medication induced.  Monitor. 9.  PEG tube: Patient has appointment with surgery in the near future. 10: Bleeding: Likely from necrotic tissue associated with tumor mass.  Monitor. 11.  Pain: Patient's fentanyl patch was recently increased by primary care to 50 mcg every 72 hours.    [name], MD 05/06/18 12:05 PM

## 2018-05-03 DIAGNOSIS — I10 Essential (primary) hypertension: Secondary | ICD-10-CM | POA: Diagnosis not present

## 2018-05-03 DIAGNOSIS — G893 Neoplasm related pain (acute) (chronic): Secondary | ICD-10-CM | POA: Diagnosis not present

## 2018-05-03 DIAGNOSIS — Z931 Gastrostomy status: Secondary | ICD-10-CM | POA: Diagnosis not present

## 2018-05-06 ENCOUNTER — Other Ambulatory Visit: Payer: Self-pay

## 2018-05-06 ENCOUNTER — Inpatient Hospital Stay: Payer: PPO

## 2018-05-06 ENCOUNTER — Inpatient Hospital Stay (HOSPITAL_BASED_OUTPATIENT_CLINIC_OR_DEPARTMENT_OTHER): Payer: PPO | Admitting: Oncology

## 2018-05-06 ENCOUNTER — Encounter: Payer: Self-pay | Admitting: Oncology

## 2018-05-06 VITALS — BP 104/69 | HR 109 | Temp 97.0°F | Resp 18 | Wt 108.5 lb

## 2018-05-06 DIAGNOSIS — G893 Neoplasm related pain (acute) (chronic): Secondary | ICD-10-CM

## 2018-05-06 DIAGNOSIS — R443 Hallucinations, unspecified: Secondary | ICD-10-CM | POA: Diagnosis not present

## 2018-05-06 DIAGNOSIS — R000 Tachycardia, unspecified: Secondary | ICD-10-CM | POA: Diagnosis not present

## 2018-05-06 DIAGNOSIS — I959 Hypotension, unspecified: Secondary | ICD-10-CM | POA: Diagnosis not present

## 2018-05-06 DIAGNOSIS — R630 Anorexia: Secondary | ICD-10-CM | POA: Diagnosis not present

## 2018-05-06 DIAGNOSIS — Z8781 Personal history of (healed) traumatic fracture: Secondary | ICD-10-CM

## 2018-05-06 DIAGNOSIS — Z87891 Personal history of nicotine dependence: Secondary | ICD-10-CM | POA: Diagnosis not present

## 2018-05-06 DIAGNOSIS — C760 Malignant neoplasm of head, face and neck: Secondary | ICD-10-CM | POA: Diagnosis not present

## 2018-05-06 DIAGNOSIS — Z931 Gastrostomy status: Secondary | ICD-10-CM | POA: Diagnosis not present

## 2018-05-06 DIAGNOSIS — C189 Malignant neoplasm of colon, unspecified: Secondary | ICD-10-CM | POA: Diagnosis not present

## 2018-05-06 DIAGNOSIS — Z5112 Encounter for antineoplastic immunotherapy: Secondary | ICD-10-CM | POA: Diagnosis not present

## 2018-05-06 DIAGNOSIS — R110 Nausea: Secondary | ICD-10-CM | POA: Diagnosis not present

## 2018-05-06 DIAGNOSIS — L7622 Postprocedural hemorrhage and hematoma of skin and subcutaneous tissue following other procedure: Secondary | ICD-10-CM | POA: Diagnosis not present

## 2018-05-06 LAB — CBC WITH DIFFERENTIAL/PLATELET
BASOS ABS: 0.1 10*3/uL (ref 0–0.1)
BASOS PCT: 1 %
Eosinophils Absolute: 0 10*3/uL (ref 0–0.7)
Eosinophils Relative: 1 %
HCT: 40.4 % (ref 35.0–47.0)
Hemoglobin: 13.5 g/dL (ref 12.0–16.0)
Lymphocytes Relative: 13 %
Lymphs Abs: 0.9 10*3/uL — ABNORMAL LOW (ref 1.0–3.6)
MCH: 27.2 pg (ref 26.0–34.0)
MCHC: 33.4 g/dL (ref 32.0–36.0)
MCV: 81.3 fL (ref 80.0–100.0)
MONO ABS: 0.3 10*3/uL (ref 0.2–0.9)
Monocytes Relative: 4 %
Neutro Abs: 5.4 10*3/uL (ref 1.4–6.5)
Neutrophils Relative %: 81 %
PLATELETS: 297 10*3/uL (ref 150–440)
RBC: 4.96 MIL/uL (ref 3.80–5.20)
RDW: 18.8 % — AB (ref 11.5–14.5)
WBC: 6.7 10*3/uL (ref 3.6–11.0)

## 2018-05-06 LAB — COMPREHENSIVE METABOLIC PANEL
ALBUMIN: 3.5 g/dL (ref 3.5–5.0)
ALK PHOS: 99 U/L (ref 38–126)
ALT: 15 U/L (ref 0–44)
AST: 27 U/L (ref 15–41)
Anion gap: 7 (ref 5–15)
BILIRUBIN TOTAL: 0.3 mg/dL (ref 0.3–1.2)
BUN: 20 mg/dL (ref 8–23)
CALCIUM: 9.3 mg/dL (ref 8.9–10.3)
CO2: 28 mmol/L (ref 22–32)
CREATININE: 0.76 mg/dL (ref 0.44–1.00)
Chloride: 101 mmol/L (ref 98–111)
GFR calc Af Amer: >60 mL/min (ref >60)
GFR calc non Af Amer: >60 mL/min (ref >60)
GLUCOSE: 101 mg/dL — AB (ref 70–99)
Potassium: 4.2 mmol/L (ref 3.5–5.1)
Sodium: 136 mmol/L (ref 135–145)
TOTAL PROTEIN: 7.1 g/dL (ref 6.5–8.1)

## 2018-05-06 MED ORDER — SODIUM CHLORIDE 0.9 % IV SOLN
240.0000 mg | Freq: Once | INTRAVENOUS | Status: AC
  Administered 2018-05-06: 240 mg via INTRAVENOUS
  Filled 2018-05-06: qty 24

## 2018-05-06 MED ORDER — SODIUM CHLORIDE 0.9 % IV SOLN
Freq: Once | INTRAVENOUS | Status: AC
  Administered 2018-05-06: 10:00:00 via INTRAVENOUS
  Filled 2018-05-06: qty 250

## 2018-05-06 NOTE — Progress Notes (Signed)
Patient here today for follow up and treatment consideration regarding head and neck cancer. Patient reports pain 5/10 this morning, Fentanyl increased to 50 mcg by pcp. Patient also reports bleeding from her neck wound during coughing episode.

## 2018-05-07 LAB — THYROID PANEL WITH TSH
FREE THYROXINE INDEX: 2.3 (ref 1.2–4.9)
T3 Uptake Ratio: 27 % (ref 24–39)
T4, Total: 8.5 ug/dL (ref 4.5–12.0)
TSH: 1.8 u[IU]/mL (ref 0.450–4.500)

## 2018-05-14 DIAGNOSIS — C189 Malignant neoplasm of colon, unspecified: Secondary | ICD-10-CM | POA: Diagnosis not present

## 2018-05-14 DIAGNOSIS — N183 Chronic kidney disease, stage 3 (moderate): Secondary | ICD-10-CM | POA: Diagnosis not present

## 2018-05-14 DIAGNOSIS — C760 Malignant neoplasm of head, face and neck: Secondary | ICD-10-CM | POA: Diagnosis not present

## 2018-05-17 NOTE — Progress Notes (Signed)
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  INTERVAL HISTORY: Patient returns to clinic today for further evaluation and consideration of cycle 4 of nivolumab.  Patient still has a significantly decreased performance status, but she is more alert and conversive today.  The mass on her neck appears to be smaller and her pain is better controlled.  She continues to have difficulty eating and uses her PEG tube for nearly 100% of her nutrition.  She has no neurologic complaints.  She denies any recent fevers. She has no chest pain or shortness of breath.  She has intermittent nausea and vomiting, but denies constipation or diarrhea.  She has no melena or hematochezia.  She has no urinary complaints.  Patient offers no further specific complaints today.  REVIEW OF SYSTEMS:   Review of Systems  Constitutional: Positive for malaise/fatigue. Negative for fever and weight loss.  HENT: Negative.  Negative for sore throat.   Respiratory: Negative.  Negative for cough, shortness of breath and stridor.   Cardiovascular: Negative.  Negative for chest pain and leg swelling.  Gastrointestinal: Positive for nausea and vomiting. Negative for abdominal pain and constipation.  Genitourinary: Negative.  Negative for dysuria.  Musculoskeletal: Positive for joint pain. Negative for falls.  Skin: Negative.  Negative for itching and rash.  Neurological: Positive for weakness. Negative for sensory change, focal weakness and headaches.  Psychiatric/Behavioral: Positive for depression and memory loss. Negative for hallucinations. The patient is nervous/anxious. The  patient does not have insomnia.     As per HPI. Otherwise, a complete review of systems is negative.  PAST MEDICAL HISTORY: Past Medical History:  Diagnosis Date  . Adenocarcinoma of colon ([place])   . Benign neoplasm of ascending colon   . Essential hypertension 06/13/2014  . Goals of care, counseling/discussion 07/14/2017  . HLD (hyperlipidemia) 06/13/2014  . Hypercholesteremia   . Hyperlipidemia   . Multiple thyroid nodules 01/22/2012  . Osteoporosis   . Polycythemia, secondary 12/26/2014  . Pure hypercholesterolemia 03/15/2015  . Thyroid nodule     PAST SURGICAL HISTORY: Past Surgical History:  Procedure Laterality Date  . ABDOMINAL HYSTERECTOMY    . COLONOSCOPY WITH PROPOFOL N/A 07/02/2017   Procedure: COLONOSCOPY WITH PROPOFOL;  Surgeon: Lucilla Lame, MD;  Location: [place];  Service: Endoscopy;  Laterality: N/A;  . ORIF ANKLE FRACTURE Left 08/08/2017   Procedure: OPEN REDUCTION INTERNAL FIXATION (ORIF) ANKLE FRACTURE;  Surgeon: Dereck Leep, MD;  Location: [facility];  Service: Orthopedics;  Laterality: Left;  . POLYPECTOMY N/A 07/02/2017   Procedure: POLYPECTOMY;  Surgeon: Lucilla Lame, MD;  Location: [place];  Service: Endoscopy;  Laterality: N/A;  . WRIST FRACTURE SURGERY  08/2009   badly broken    FAMILY HISTORY Family History  Problem Relation Age of Onset  . Heart disease Mother   . Diabetes Father        ADVANCED DIRECTIVES:    HEALTH MAINTENANCE: Social History   Tobacco Use  . Smoking status: Former Smoker    Packs/day: 0.25    Types: Cigarettes    Last attempt to quit: 06/17/2017    Years since quitting: 0.9  . Smokeless tobacco: Never Used  Substance Use Topics  .  Alcohol use: No  . Drug use: No     Allergies  Allergen Reactions  . Potassium Nausea Only and Nausea And Vomiting  . Other Other (See Comments) and Rash    TDAP tdap TDAP  . Tetanus Antitoxin Rash    Current Outpatient Medications  Medication Sig  Dispense Refill  . calcium carbonate (OSCAL) 1500 (600 Ca) MG TABS tablet Take 600 mg of elemental calcium by mouth daily with breakfast.    . ENSURE (ENSURE) Take 237 mLs by mouth.    . fentaNYL (DURAGESIC - DOSED MCG/HR) 50 MCG/HR Place 50 mcg onto the skin every 3 (three) days.     Marland Kitchen ibuprofen (ADVIL,MOTRIN) 200 MG tablet Take 400 mg by mouth every 4 (four) hours as needed.    . pravastatin (PRAVACHOL) 10 MG tablet Take 10 mg by mouth daily.    . promethazine (PHENERGAN) 25 MG tablet Take 25 mg by mouth every 6 (six) hours as needed for nausea or vomiting.    Marland Kitchen morphine (ROXANOL) 20 MG/ML concentrated solution Place 0.25 mLs (5 mg total) into feeding tube every 4 (four) hours as needed for moderate pain or severe pain. 30 mL 0  . NARCAN 4 MG/0.1ML LIQD nasal spray kit USE 1 SPRAY PER INTRANASAL ROUTE AS DIRECTED  0   No current facility-administered medications for this visit.    Facility-Administered Medications Ordered in Other Visits  Medication Dose Route Frequency Provider Last Rate Last Dose  . heparin lock flush 100 unit/mL  250 Units Intracatheter PRN Leia Alf, MD      . heparin lock flush 100 unit/mL  500 Units Intracatheter PRN Leia Alf, MD      . potassium chloride 20 mEq in sodium chloride 0.9 % 500 mL injection   Intravenous Once Tye Savoy, Doni H, RN      . sodium chloride 0.9 % injection 10 mL  10 mL Intracatheter PRN Leia Alf, MD      . sodium chloride 0.9 % injection 10 mL  10 mL Intracatheter PRN Leia Alf, MD      . sodium chloride 0.9 % injection 10 mL  10 mL Intracatheter PRN Ma Hillock, Sandeep, MD      . sodium chloride 0.9 % injection 10 mL  10 mL Intracatheter PRN Leia Alf, MD      -year-old  OBJECTIVE: Vitals:   05/20/18 0912  BP: 115/74  Pulse: 100  Resp: 18  Temp: (!) 95.1 F (35.1 C)     Body mass index is 18.71 kg/m.    ECOG FS:3 - Symptomatic, >50% confined to bed  General: Ill-appearing, no acute distress.  Sitting in a  wheelchair. Eyes: Pink conjunctiva, anicteric sclera. HEENT: Mass on left neck appears to be decreased in size.  Difficult to evaluate oropharynx. Lungs: Clear to auscultation bilaterally. Heart: Regular rate and rhythm. No rubs, murmurs, or gallops. Abdomen: Soft, nontender, nondistended. No organomegaly noted, normoactive bowel sounds. Musculoskeletal: No edema, cyanosis, or clubbing. Neuro: Alert, answering all questions appropriately. Cranial nerves grossly intact. Skin: No rashes or petechiae noted. Psych: Flat affect.    LAB RESULTS:  Lab Results  Component Value Date   NA 138 05/20/2018   K 4.0 05/20/2018   CL 104 05/20/2018   CO2 25 05/20/2018   GLUCOSE 113 (H) 05/20/2018   BUN 22 05/20/2018   CREATININE 0.79 05/20/2018   CALCIUM 9.1 05/20/2018   PROT 6.8 05/20/2018   ALBUMIN 3.5 05/20/2018   AST 26 05/20/2018   ALT 13  05/20/2018   ALKPHOS 89 05/20/2018   BILITOT 0.4 05/20/2018   GFRNONAA >60 05/20/2018   GFRAA >60 05/20/2018    Lab Results  Component Value Date   WBC 6.7 05/20/2018   NEUTROABS 5.3 05/20/2018   HGB 13.0 05/20/2018   HCT 39.4 05/20/2018   MCV 82.4 05/20/2018   PLT 322 05/20/2018     STUDIES: No results found.  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  PLAN:    1.  Recurrent, progressive stage IVa head and neck squamous cell carcinoma: Despite recent surgery at [facility] for residual disease, patient continues to have progressive malignancy.  Her performance status is significantly decreased.  Previously we discussed at length the possibility of enrolling in hospice, but patient ultimately decided she would like to try immunotherapy as one last effort for control of disease.  Patient will receive nivolumab every 2 weeks until intolerable side effects or progression of disease.  She expressed understanding that this is likely not curative.  Proceed with cycle 4 nivolumab today.   Patient also had consultation with palliative care today.  Return to clinic in 2 weeks for further evaluation and consideration of cycle 5.   2.  Adenocarcinoma of the colon: No intervention is planned at this time.  Patient could not tolerate any further surgery.  CEA continues to be within normal limits at 2.6. 3.  Fractured ankles: Continue follow-up and treatment per orthopedics. 4.  Decreased performance status: Likely multifactorial.  Chronic and unchanged.  Continue home health and physical therapy.  Consider referral back to dietary in the near future.  Patient has declined hospice as above.  Appreciate palliative care input. 5.  Nausea: Continue current medications as prescribed. 6.  Pain: Continue current narcotic regimen. 7.  Hypotension/tachycardia: Improved.  Monitor. 8.  Hallucinations: Unclear etiology, but likely medication induced.  Monitor. 9.  PEG tube: Appreciate surgical input.  Continue tube feeds as directed. 10. Pain: Continue fentanyl patch and liquid morphine as needed.    [name], MD 05/22/18 8:21 AM

## 2018-05-18 DIAGNOSIS — L988 Other specified disorders of the skin and subcutaneous tissue: Secondary | ICD-10-CM | POA: Diagnosis not present

## 2018-05-18 DIAGNOSIS — L989 Disorder of the skin and subcutaneous tissue, unspecified: Secondary | ICD-10-CM | POA: Insufficient documentation

## 2018-05-18 DIAGNOSIS — K9423 Gastrostomy malfunction: Secondary | ICD-10-CM | POA: Diagnosis not present

## 2018-05-18 DIAGNOSIS — D485 Neoplasm of uncertain behavior of skin: Secondary | ICD-10-CM | POA: Diagnosis not present

## 2018-05-18 DIAGNOSIS — L259 Unspecified contact dermatitis, unspecified cause: Secondary | ICD-10-CM | POA: Diagnosis not present

## 2018-05-20 ENCOUNTER — Inpatient Hospital Stay: Payer: PPO

## 2018-05-20 ENCOUNTER — Inpatient Hospital Stay: Payer: PPO | Attending: Oncology

## 2018-05-20 ENCOUNTER — Inpatient Hospital Stay (HOSPITAL_BASED_OUTPATIENT_CLINIC_OR_DEPARTMENT_OTHER): Payer: PPO | Admitting: Hospice and Palliative Medicine

## 2018-05-20 ENCOUNTER — Inpatient Hospital Stay (HOSPITAL_BASED_OUTPATIENT_CLINIC_OR_DEPARTMENT_OTHER): Payer: PPO | Admitting: Oncology

## 2018-05-20 ENCOUNTER — Other Ambulatory Visit: Payer: Self-pay

## 2018-05-20 VITALS — BP 115/74 | HR 100 | Temp 95.1°F | Resp 18 | Wt 109.0 lb

## 2018-05-20 DIAGNOSIS — R443 Hallucinations, unspecified: Secondary | ICD-10-CM

## 2018-05-20 DIAGNOSIS — G8929 Other chronic pain: Secondary | ICD-10-CM | POA: Diagnosis not present

## 2018-05-20 DIAGNOSIS — C760 Malignant neoplasm of head, face and neck: Secondary | ICD-10-CM

## 2018-05-20 DIAGNOSIS — C189 Malignant neoplasm of colon, unspecified: Secondary | ICD-10-CM

## 2018-05-20 DIAGNOSIS — Z87891 Personal history of nicotine dependence: Secondary | ICD-10-CM | POA: Diagnosis not present

## 2018-05-20 DIAGNOSIS — E785 Hyperlipidemia, unspecified: Secondary | ICD-10-CM

## 2018-05-20 DIAGNOSIS — N183 Chronic kidney disease, stage 3 (moderate): Secondary | ICD-10-CM | POA: Diagnosis not present

## 2018-05-20 DIAGNOSIS — Z85038 Personal history of other malignant neoplasm of large intestine: Secondary | ICD-10-CM

## 2018-05-20 DIAGNOSIS — Z8781 Personal history of (healed) traumatic fracture: Secondary | ICD-10-CM | POA: Diagnosis not present

## 2018-05-20 DIAGNOSIS — R000 Tachycardia, unspecified: Secondary | ICD-10-CM

## 2018-05-20 DIAGNOSIS — I959 Hypotension, unspecified: Secondary | ICD-10-CM

## 2018-05-20 DIAGNOSIS — R110 Nausea: Secondary | ICD-10-CM | POA: Diagnosis not present

## 2018-05-20 DIAGNOSIS — Z5112 Encounter for antineoplastic immunotherapy: Secondary | ICD-10-CM | POA: Insufficient documentation

## 2018-05-20 DIAGNOSIS — R197 Diarrhea, unspecified: Secondary | ICD-10-CM | POA: Insufficient documentation

## 2018-05-20 DIAGNOSIS — Z515 Encounter for palliative care: Secondary | ICD-10-CM

## 2018-05-20 DIAGNOSIS — I10 Essential (primary) hypertension: Secondary | ICD-10-CM

## 2018-05-20 DIAGNOSIS — Z931 Gastrostomy status: Secondary | ICD-10-CM | POA: Diagnosis not present

## 2018-05-20 DIAGNOSIS — R531 Weakness: Secondary | ICD-10-CM

## 2018-05-20 LAB — COMPREHENSIVE METABOLIC PANEL
ALBUMIN: 3.5 g/dL (ref 3.5–5.0)
ALK PHOS: 89 U/L (ref 38–126)
ALT: 13 U/L (ref 0–44)
AST: 26 U/L (ref 15–41)
Anion gap: 9 (ref 5–15)
BILIRUBIN TOTAL: 0.4 mg/dL (ref 0.3–1.2)
BUN: 22 mg/dL (ref 8–23)
CALCIUM: 9.1 mg/dL (ref 8.9–10.3)
CO2: 25 mmol/L (ref 22–32)
Chloride: 104 mmol/L (ref 98–111)
Creatinine, Ser: 0.79 mg/dL (ref 0.44–1.00)
GFR calc Af Amer: >60 mL/min (ref >60)
GFR calc non Af Amer: >60 mL/min (ref >60)
GLUCOSE: 113 mg/dL — AB (ref 70–99)
POTASSIUM: 4 mmol/L (ref 3.5–5.1)
Sodium: 138 mmol/L (ref 135–145)
TOTAL PROTEIN: 6.8 g/dL (ref 6.5–8.1)

## 2018-05-20 LAB — CBC WITH DIFFERENTIAL/PLATELET
Basophils Absolute: 0 10*3/uL (ref 0–0.1)
Basophils Relative: 0 %
Eosinophils Absolute: 0.1 10*3/uL (ref 0–0.7)
Eosinophils Relative: 1 %
HEMATOCRIT: 39.4 % (ref 35.0–47.0)
HEMOGLOBIN: 13 g/dL (ref 12.0–16.0)
LYMPHS ABS: 1 10*3/uL (ref 1.0–3.6)
Lymphocytes Relative: 14 %
MCH: 27.2 pg (ref 26.0–34.0)
MCHC: 33 g/dL (ref 32.0–36.0)
MCV: 82.4 fL (ref 80.0–100.0)
MONOS PCT: 6 %
Monocytes Absolute: 0.4 10*3/uL (ref 0.2–0.9)
Neutro Abs: 5.3 10*3/uL (ref 1.4–6.5)
Neutrophils Relative %: 79 %
Platelets: 322 10*3/uL (ref 150–440)
RBC: 4.77 MIL/uL (ref 3.80–5.20)
RDW: 21 % — ABNORMAL HIGH (ref 11.5–14.5)
WBC: 6.7 10*3/uL (ref 3.6–11.0)

## 2018-05-20 MED ORDER — SODIUM CHLORIDE 0.9 % IV SOLN
240.0000 mg | Freq: Once | INTRAVENOUS | Status: AC
  Administered 2018-05-20: 240 mg via INTRAVENOUS
  Filled 2018-05-20: qty 24

## 2018-05-20 MED ORDER — HEPARIN SOD (PORK) LOCK FLUSH 100 UNIT/ML IV SOLN: 500.0000 [IU] | Freq: Once | INTRAVENOUS | Status: DC | PRN

## 2018-05-20 MED ORDER — MORPHINE SULFATE (CONCENTRATE) 20 MG/ML PO SOLN: 5.0000 mg | ORAL | 0 refills | Status: DC | PRN

## 2018-05-20 MED ORDER — SODIUM CHLORIDE 0.9 % IV SOLN
Freq: Once | INTRAVENOUS | Status: AC
  Administered 2018-05-20: 11:00:00 via INTRAVENOUS
  Filled 2018-05-20: qty 250

## 2018-05-20 NOTE — Progress Notes (Signed)
Nutrition Follow-up:  Patient with stage IV head and neck cancer and adenocarcinoma of colon.  Patient followed by Dr. Grayland Ormond.  Patient s/p surgery in May 2019 right neck dissection, right segmental mandibulectomy and right pectoralis flap.  Patient s/p G-tube placement on 6/7.  Patient currently receiving immunotherapy.  Also met with palliative care today.   Met with husband during infusion today.  Reports that feeding tube was changed out recently and it is working well.  Reports that she is getting ensure plus 4 times per day (7am, 10am, 1pm and 4pm) via tube and tolerating well.  Reports that 5 cartons of tube feeding was making her sick (ie nausea and vomiting).  Reports that he give 23m of water before and after each feeding.  Also give medications at 7 am, 12 and sometimes around 7:30pm at night with water flush.  Reports that she is urinating well and having regular bowel movement  Reports that she he tries to get her to take soft foods, pudding, yogurt, soups, spaghetti but she only takes a 1/2 spoonful at times.    Husband reports that ALa Grangeis sending him supplies and ensure plus  Medications: reviewed  Labs: glucose 113  Anthropometrics:   Weight 109 lb today in clinic.  Weight has been between 109 and 110 lb since surgery in May 2019.  Prior to surgery weight was 116-120s.    8% weight loss in 9 months  Estimated Energy Needs  Kcals: 1250-1500 calories Protein: 62-75 g/d Fluid: 1.5 L/d  NUTRITION DIAGNOSIS: Inadequate oral intake poor with resulting feeding tube   INTERVENTION:  Recommend continuing current tube feeding regimen of ensure plus 4 cartons per day with 60 ml water flush before and after feeding.  Provides: 1400 calories, 52 g protein and 1200 free water (does not include water with medications).  May need to consider trying promod 369mdaily for additional calories and protein to better meet protein needs as po intake limited at this time.       MONITORING, EVALUATION, GOAL: weight trends, TF tolerance   NEXT VISIT: October 17 during infusion  Ulysse Siemen B. AlZenia ResidesRDCarrier MillsLDGormanegistered Dietitian [phone])

## 2018-05-20 NOTE — Progress Notes (Signed)
Here for follow up.pain meds/ Fentanyl helping pt"thinks " tongue lesions continue and husband stated removing dead skin as able

## 2018-05-20 NOTE — Progress Notes (Signed)
[place]  [phone] [phone]   Name: Dana Perez Date: 05/20/2018 MRN: [id number]  DOB: 1940/11/19  Patient Care Team: Glendon Axe, MD as PCP - General (Internal Medicine) [name], MD as Referring Physician (Surgery)    REASON FOR CONSULTATION: Palliative Care consult requested for this 77 y.o. 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  PMH also includes adenocarcinoma of the colon not currently on treatment, malnutrition status post PEG insertion, she had recent bilateral ankle fractures causing a marked reduction in her performance status, chronic pain on transdermal fentanyl, hypertension, hyperlipidemia, and CKD stage III.  Patient has had disease progression and general decline despite ongoing treatment of her head & neck cancer.  Hospice had been discussed but patient/husband opted to continue treatment with nivolumab.  Palliative care is now asked to follow and provide her with support.   SOCIAL HISTORY:  Patient is married.  She lives at home with her husband.  She has no children.  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  ADVANCE DIRECTIVES:  Patient does not currently have advance directives.  She would want her husband to be her decision-maker.  Patient and husband have talked about completing a living will but have not currently done so.  CODE STATUS: DNR  PAST MEDICAL HISTORY: Past Medical History:  Diagnosis Date  . Adenocarcinoma of colon ([place])   . Benign neoplasm of ascending colon   . Essential hypertension 06/13/2014  . Goals of care, counseling/discussion 07/14/2017  . HLD (hyperlipidemia) 06/13/2014  . Hypercholesteremia   . Hyperlipidemia     . Multiple thyroid nodules 01/22/2012  . Osteoporosis   . Polycythemia, secondary 12/26/2014  . Pure hypercholesterolemia 03/15/2015  . Thyroid nodule     PAST SURGICAL HISTORY:  Past Surgical History:  Procedure Laterality Date  . ABDOMINAL HYSTERECTOMY    . COLONOSCOPY WITH PROPOFOL N/A 07/02/2017   Procedure: COLONOSCOPY WITH PROPOFOL;  Surgeon: Lucilla Lame, MD;  Location: [place];  Service: Endoscopy;  Laterality: N/A;  . ORIF ANKLE FRACTURE Left 08/08/2017   Procedure: OPEN REDUCTION INTERNAL FIXATION (ORIF) ANKLE FRACTURE;  Surgeon: Dereck Leep, MD;  Location: [facility];  Service: Orthopedics;  Laterality: Left;  . POLYPECTOMY N/A 07/02/2017   Procedure: POLYPECTOMY;  Surgeon: Lucilla Lame, MD;  Location: [place];  Service: Endoscopy;  Laterality: N/A;  . WRIST FRACTURE SURGERY  08/2009   badly broken    HEMATOLOGY/ONCOLOGY HISTORY:    Squamous cell carcinoma of head and neck ([place])   07/01/2017 Initial Diagnosis    Squamous cell carcinoma of head and neck ([place])    04/02/2018 -  Chemotherapy    The patient had nivolumab (OPDIVO) 240 mg in sodium chloride 0.9 % 100 mL chemo infusion, 240 mg, Intravenous, Once, 4 of 6 cycles Administration: 240 mg (04/08/2018), 240 mg (04/22/2018), 240 mg (05/06/2018)  for chemotherapy treatment.      ALLERGIES:  is allergic to potassium; other; and tetanus antitoxin.  MEDICATIONS:  Current Outpatient Medications  Medication Sig Dispense Refill  . calcium carbonate (OSCAL) 1500 (600 Ca) MG TABS tablet Take 600 mg of elemental calcium by mouth daily with breakfast.    . ENSURE (ENSURE) Take 237 mLs by mouth.    . fentaNYL (DURAGESIC - DOSED  MCG/HR) 50 MCG/HR Place 50 mcg onto the skin every 3 (three) days.     Marland Kitchen ibuprofen (ADVIL,MOTRIN) 200 MG tablet Take 400 mg by mouth every 4 (four) hours as needed.    Marland Kitchen morphine (ROXANOL) 20 MG/ML concentrated solution Place 0.25 mLs (5 mg total) into feeding tube every 4 (four)  hours as needed for moderate pain or severe pain. 30 mL 0  . NARCAN 4 MG/0.1ML LIQD nasal spray kit USE 1 SPRAY PER INTRANASAL ROUTE AS DIRECTED  0  . pravastatin (PRAVACHOL) 10 MG tablet Take 10 mg by mouth daily.    . promethazine (PHENERGAN) 25 MG tablet Take 25 mg by mouth every 6 (six) hours as needed for nausea or vomiting.     No current facility-administered medications for this visit.    Facility-Administered Medications Ordered in Other Visits  Medication Dose Route Frequency Provider Last Rate Last Dose  . heparin lock flush 100 unit/mL  250 Units Intracatheter PRN Leia Alf, MD      . heparin lock flush 100 unit/mL  500 Units Intracatheter PRN Leia Alf, MD      . heparin lock flush 100 unit/mL  500 Units Intracatheter Once PRN [name], MD      . nivolumab (OPDIVO) 240 mg in sodium chloride 0.9 % 100 mL chemo infusion  240 mg Intravenous Once [name], MD      . potassium chloride 20 mEq in sodium chloride 0.9 % 500 mL injection   Intravenous Once Tye Savoy, Doni H, RN      . sodium chloride 0.9 % injection 10 mL  10 mL Intracatheter PRN Leia Alf, MD      . sodium chloride 0.9 % injection 10 mL  10 mL Intracatheter PRN Leia Alf, MD      . sodium chloride 0.9 % injection 10 mL  10 mL Intracatheter PRN Ma Hillock, Sandeep, MD      . sodium chloride 0.9 % injection 10 mL  10 mL Intracatheter PRN Leia Alf, MD        VITAL SIGNS: There were no vitals taken for this visit. There were no vitals filed for this visit.  Estimated body mass index is 18.71 kg/m as calculated from the following:   Height as of 04/14/18: 5' 4" (1.626 m).   Weight as of an earlier encounter on 05/20/18: 109 lb (49.4 kg).  LABS: CBC:    Component Value Date/Time   WBC 6.7 05/20/2018 0849   HGB 13.0 05/20/2018 0849   HCT 39.4 05/20/2018 0849   PLT 322 05/20/2018 0849   MCV 82.4 05/20/2018 0849   NEUTROABS 5.3 05/20/2018 0849   LYMPHSABS 1.0 05/20/2018  0849   MONOABS 0.4 05/20/2018 0849   EOSABS 0.1 05/20/2018 0849   BASOSABS 0.0 05/20/2018 0849   Comprehensive Metabolic Panel:    Component Value Date/Time   NA 138 05/20/2018 0849   K 4.0 05/20/2018 0849   CL 104 05/20/2018 0849   CO2 25 05/20/2018 0849   BUN 22 05/20/2018 0849   CREATININE 0.79 05/20/2018 0849   GLUCOSE 113 (H) 05/20/2018 0849   CALCIUM 9.1 05/20/2018 0849   AST 26 05/20/2018 0849   ALT 13 05/20/2018 0849   ALKPHOS 89 05/20/2018 0849   BILITOT 0.4 05/20/2018 0849   PROT 6.8 05/20/2018 0849   ALBUMIN 3.5 05/20/2018 0849    RADIOGRAPHIC STUDIES: No results found.  PERFORMANCE STATUS (ECOG) : 3 - Symptomatic, >50% confined to bed  Review of Systems  As noted above. Otherwise, a complete review of systems is negative.  Physical Exam General: ill appearing, in wheelchair HEENT: necrotic appearing neck mass, oral mucosa dry and flaky Lungs: Clear to auscultation bilaterally. Heart: Regular rate and rhythm. Abdomen: Soft, nontender, PEG noted but not visualized Musculoskeletal: No edema, cyanosis, or clubbing. Neuro: Alert, answers some questions but speaking is difficult   IMPRESSION: I met with patient and her husband today in the clinic.  Husband primarily communicated for patient given her difficulty with speaking.  Husband feels like patient has been improving on the nivolumab.  He remains optimistic and hopeful for a good response to treatment.  However, he recognizes that future treatment options are limited and that patient could be approaching end-of-life.    At baseline, patient relies on husband for all ADLs. She is chairbound but can take a few steps with assistance.   Patient has been symptomatic with pain despite increased transdermal fentanyl to 50 mcg every 72 hours.  Patient has ongoing reports of breakthrough pain not entirely relieved by PRN ibuprofen.  Will start PRN morphine elixir to be given via PEG for breakthrough pain.  Continue  transdermal fentanyl.  Patient's pain will likely need combination of a long-acting opioid and short acting opioid for use with BTP.  Patient denies constipation and we discussed a PRN bowel regimen if needed.  Husband states his primary goal at this point is for continued treatment.  However, he verbalized his desire to ensure the patient is comfortable.  He prioritizes keeping her at home although he would not exclude the option of hospitalization if needed.  We discussed at length and completed today a MOST form.  Patient would not want to be resuscitated or have her life prolonged artificially via machines.  MOST form details desire for DNAR, limited scope of treatment, IV fluids and antibiotics for defined trial period.  Patient already has a PEG and receives supplemental nutrition.  Husband says that he is familiar with hospice services from the care of his father.  We discussed hospice as being a future intervention should patient reach a point where she is no longer appropriate for oncologic intervention.  PLAN: 1. DNR and MOST form completed today (DNAR, limited scope of treatment, IVF/ABX for defined trial period) 2. Morphine elixir (75m/ML) 535m(0.2533mvia PEG q4h prn for pain 3. Continue transdermal fentanyl 4. RTC in 2 weeks at time of appointment with Dr. FinGrayland Ormond Patient expressed understanding and was in agreement with this plan. She also understands that She can call clinic at any time with any questions, concerns, or complaints.    Time Total: 45 minutes  Visit consisted of counseling and education dealing with the complex and emotionally intense issues of symptom management and palliative care in the setting of serious and potentially life-threatening illness.Greater than 50%  of this time was spent counseling and coordinating care related to the above assessment and plan.  Signed by: JosAltha HarmNPHawkinsP-C, ACHMakenaork Cell)

## 2018-05-21 ENCOUNTER — Other Ambulatory Visit: Payer: Self-pay | Admitting: *Deleted

## 2018-05-21 LAB — THYROID PANEL WITH TSH
Free Thyroxine Index: 2.2 (ref 1.2–4.9)
T3 UPTAKE RATIO: 25 % (ref 24–39)
T4, Total: 8.8 ug/dL (ref 4.5–12.0)
TSH: 2.2 u[IU]/mL (ref 0.450–4.500)

## 2018-05-21 MED ORDER — MORPHINE SULFATE (CONCENTRATE) 20 MG/ML PO SOLN: 5.0000 mg | ORAL | 0 refills | Status: DC | PRN

## 2018-05-21 NOTE — Addendum Note (Signed)
Addended by: Betti Cruz on: 05/21/2018 12:49 PM   Modules accepted: Orders

## 2018-05-21 NOTE — Telephone Encounter (Signed)
Prescription needs ICD 10 code

## 2018-05-21 NOTE — Addendum Note (Signed)
Addended by: Jarrett Soho C on: 05/21/2018 11:53 AM   Modules accepted: Orders

## 2018-05-22 ENCOUNTER — Encounter: Payer: Self-pay | Admitting: Emergency Medicine

## 2018-05-22 ENCOUNTER — Other Ambulatory Visit: Payer: Self-pay

## 2018-05-22 ENCOUNTER — Emergency Department: Payer: PPO

## 2018-05-22 ENCOUNTER — Emergency Department
Admission: EM | Admit: 2018-05-22 | Discharge: 2018-05-22 | Disposition: A | Discharge status: Home / Self Care | Payer: PPO | Attending: Emergency Medicine | Admitting: Emergency Medicine

## 2018-05-22 DIAGNOSIS — Z87891 Personal history of nicotine dependence: Secondary | ICD-10-CM | POA: Insufficient documentation

## 2018-05-22 DIAGNOSIS — K9420 Gastrostomy complication, unspecified: Secondary | ICD-10-CM | POA: Diagnosis not present

## 2018-05-22 DIAGNOSIS — I129 Hypertensive chronic kidney disease with stage 1 through stage 4 chronic kidney disease, or unspecified chronic kidney disease: Secondary | ICD-10-CM | POA: Diagnosis not present

## 2018-05-22 DIAGNOSIS — Z7902 Long term (current) use of antithrombotics/antiplatelets: Secondary | ICD-10-CM | POA: Diagnosis not present

## 2018-05-22 DIAGNOSIS — Z79899 Other long term (current) drug therapy: Secondary | ICD-10-CM | POA: Insufficient documentation

## 2018-05-22 DIAGNOSIS — N183 Chronic kidney disease, stage 3 (moderate): Secondary | ICD-10-CM | POA: Diagnosis not present

## 2018-05-22 DIAGNOSIS — D010 Carcinoma in situ of colon: Secondary | ICD-10-CM | POA: Diagnosis not present

## 2018-05-22 DIAGNOSIS — Z431 Encounter for attention to gastrostomy: Secondary | ICD-10-CM | POA: Diagnosis not present

## 2018-05-22 DIAGNOSIS — Z85828 Personal history of other malignant neoplasm of skin: Secondary | ICD-10-CM | POA: Diagnosis not present

## 2018-05-22 HISTORY — DX: Squamous cell carcinoma of skin of scalp and neck: C44.42

## 2018-05-22 HISTORY — DX: Malignant neoplasm of head, face and neck: C76.0

## 2018-05-22 MED ORDER — IOPAMIDOL (ISOVUE-300) INJECTION 61%
50.0000 mL | Freq: Once | INTRAVENOUS | Status: AC | PRN
  Administered 2018-05-22: 50 mL

## 2018-05-22 NOTE — ED Notes (Signed)
X-ray at bedside

## 2018-05-22 NOTE — ED Notes (Signed)
ED Provider at bedside. 

## 2018-05-22 NOTE — ED Triage Notes (Signed)
Pt is cancer patient and her feeding tube is clogged since 3 am this morning. Nothing PO.  Cannot get meds or food. Food is done as bolus.  Attempted to flush. Unable to flush feeding tube.

## 2018-05-22 NOTE — ED Notes (Signed)
G-tube replaced by MD. Pending imaging for placement verification per MD.

## 2018-05-22 NOTE — ED Provider Notes (Signed)
 [facility] Emergency Department Provider Note  ____________________________________________  Time seen: Approximately 10:32 AM  I have reviewed the triage vital signs and the nursing notes.   HISTORY  Chief Complaint Feeding tube clogged  Level 5 caveat:  Portions of the history and physical were unable to be obtained due to nonverbal   HPI Dana Perez is a 77 y.o. female with a history of head and neck cancer feeding tube dependent who presents for evaluation of feeding tube clogged.  Husband reports that the G-tube that was exchanged in clinic by Dr. Cintron-Diaz 4 days ago.  Was working well until 3 AM this morning.  He tried flushing with water and Coke with no improvement.  Patient does not seem to be in any discomfort.  Husband endorses that he crushes all her pills and flushes the tube both before and after every time he uses it.   Past Medical History:  Diagnosis Date  . Adenocarcinoma of colon (HCC)   . Benign neoplasm of ascending colon   . Essential hypertension 06/13/2014  . Goals of care, counseling/discussion 07/14/2017  . HLD (hyperlipidemia) 06/13/2014  . Hypercholesteremia   . Hyperlipidemia   . Multiple thyroid nodules 01/22/2012  . Osteoporosis   . Polycythemia, secondary 12/26/2014  . Pure hypercholesterolemia 03/15/2015  . Squamous cell carcinoma of head and neck (HCC)   . Thyroid nodule     Patient Active Problem List   Diagnosis Date Noted  . Nausea and vomiting 02/03/2018  . Cancer associated pain 12/31/2017  . Healthcare-associated pneumonia   . CAP (community acquired pneumonia) 11/15/2017  . Acute hypoxemic respiratory failure (HCC) 11/15/2017  . Oral candidiasis 11/15/2017  . Acute respiratory failure (HCC) 11/15/2017  . Trimalleolar fracture of ankle, closed, left, with routine healing, subsequent encounter 09/27/2017  . Malnutrition of moderate degree 08/10/2017  . Closed left ankle fracture 08/07/2017  .  Goals of care, counseling/discussion 07/14/2017  . Adenocarcinoma of colon (HCC)   . Benign neoplasm of ascending colon   . Squamous cell carcinoma of head and neck (HCC) 07/01/2017  . CKD (chronic kidney disease) stage 3, GFR 30-59 ml/min (HCC) 04/26/2017  . Erythrocytosis 07/17/2015  . Pure hypercholesterolemia 03/15/2015  . Polycythemia, secondary 12/26/2014  . Thyroid nodule 12/14/2014  . HLD (hyperlipidemia) 06/13/2014  . Essential (primary) hypertension 06/13/2014  . Hyperlipidemia, unspecified 06/13/2014  . Essential hypertension 06/13/2014  . Multiple thyroid nodules 01/22/2012    Past Surgical History:  Procedure Laterality Date  . ABDOMINAL HYSTERECTOMY    . COLONOSCOPY WITH PROPOFOL N/A 07/02/2017   Procedure: COLONOSCOPY WITH PROPOFOL;  Surgeon: Wohl, Darren, MD;  Location: [facility];  Service: Endoscopy;  Laterality: N/A;  . ORIF ANKLE FRACTURE Left 08/08/2017   Procedure: OPEN REDUCTION INTERNAL FIXATION (ORIF) ANKLE FRACTURE;  Surgeon: Hooten, James P, MD;  Location: [facility];  Service: Orthopedics;  Laterality: Left;  . POLYPECTOMY N/A 07/02/2017   Procedure: POLYPECTOMY;  Surgeon: Wohl, Darren, MD;  Location: [facility];  Service: Endoscopy;  Laterality: N/A;  . WRIST FRACTURE SURGERY  08/2009   badly broken    Prior to Admission medications   Medication Sig Start Date End Date Taking? Authorizing Provider  calcium carbonate (OSCAL) 1500 (600 Ca) MG TABS tablet Take 600 mg of elemental calcium by mouth daily with breakfast.    [provider]  ENSURE (ENSURE) Take 237 mLs by mouth.    [provider]  fentaNYL (DURAGESIC - DOSED MCG/HR) 50 MCG/HR Place 50   mcg onto the skin every 3 (three) days.  05/03/18   [provider]  ibuprofen (ADVIL,MOTRIN) 200 MG tablet Take 400 mg by mouth every 4 (four) hours as needed.    [provider]  morphine (ROXANOL) 20 MG/ML concentrated solution Place 0.25 mLs (5 mg total)  into feeding tube every 4 (four) hours as needed for moderate pain or severe pain. 05/21/18   Borders, Kirt Boys, NP  NARCAN 4 MG/0.1ML LIQD nasal spray kit USE 1 SPRAY PER INTRANASAL ROUTE AS DIRECTED 05/03/18   [provider]  pravastatin (PRAVACHOL) 10 MG tablet Take 10 mg by mouth daily.    [provider]  promethazine (PHENERGAN) 25 MG tablet Take 25 mg by mouth every 6 (six) hours as needed for nausea or vomiting.    [provider]    Allergies Potassium; Other; and Tetanus antitoxin  Family History  Problem Relation Age of Onset  . Heart disease Mother   . Diabetes Father     Social History Social History   Tobacco Use  . Smoking status: Former Smoker    Packs/day: 0.25    Types: Cigarettes    Last attempt to quit: 06/17/2017    Years since quitting: 0.9  . Smokeless tobacco: Never Used  Substance Use Topics  . Alcohol use: No  . Drug use: No    Review of Systems  Constitutional: Negative for fever. Respiratory: Negative for shortness of breath. Gastrointestinal: Negative for abdominal pain, vomiting or diarrhea.  ____________________________________________   PHYSICAL EXAM:  VITAL SIGNS: ED Triage Vitals  Enc Vitals Group     BP 05/22/18 0925 130/63     Pulse Rate 05/22/18 0925 85     Resp 05/22/18 0925 16     Temp 05/22/18 0925 97.6 F (36.4 C)     Temp Source 05/22/18 0925 Oral     SpO2 05/22/18 0925 96 %     Weight 05/22/18 0922 108 lb 15.9 oz (49.4 kg)     Height --      Head Circumference --      Peak Flow --      Pain Score 05/22/18 0921 0     Pain Loc --      Pain Edu? --      Excl. in [place]? --     Constitutional: Alert and oriented. Well appearing and in no apparent distress. HEENT:      Head: Normocephalic and atraumatic.         Eyes: Conjunctivae are normal. Sclera is non-icteric.       Mouth/Throat: Mucous membranes are moist.       Neck: Supple with no signs of meningismus. Cardiovascular: Regular rate  and rhythm. No murmurs, gallops, or rubs. 2+ symmetrical distal pulses are present in all extremities. No JVD. Respiratory: Normal respiratory effort. Lungs are clear to auscultation bilaterally. No wheezes, crackles, or rhonchi.  Gastrointestinal: Soft, non tender, and non distended with positive bowel sounds. No rebound or guarding. G-tube in place with no residue seen in the tube  Musculoskeletal: Nontender with normal range of motion in all extremities. No edema, cyanosis, or erythema of extremities. Neurologic: Non verbal, moves all extremities, face symmetric Skin: Skin is warm, dry and intact. No rash noted.   ____________________________________________   LABS (all labs ordered are listed, but only abnormal results are displayed)  Labs Reviewed - No data to display ____________________________________________  EKG  none  ____________________________________________  RADIOLOGY  I have personally reviewed the images  performed during this visit and I agree with the Radiologist's read.   Interpretation by Radiologist:  Dg Abdomen 1 View  Result Date: 05/22/2018 CLINICAL DATA:  G-tube placement EXAM: ABDOMEN - 1 VIEW COMPARISON:  Portable exam 1319 hours compared to PET-CT 11/02/2017 FINDINGS: Contrast was injected through a gastrostomy tube. Catheter balloon is at the distal gastric antrum. Contrast opacifies the gastric lumen, duodenum and proximal jejunal loops. No definite contrast extravasation. IMPRESSION: Gastrostomy tube is at the distal gastric antrum. Electronically Signed   By: Mark  Boles M.D.   On: 05/22/2018 13:33     ____________________________________________   PROCEDURES  Procedure(s) performed:yes Procedures   Gastrostomy tube replacement Performed by: [place] Veronese Consent: Verbal consent obtained. Risks and benefits: risks, benefits and alternatives were discussed Required items: required blood products, implants, devices, and special equipment  available Patient identity confirmed: hospital-assigned identification number Time out: Immediately prior to procedure a "time out" was called to verify the correct patient, procedure, equipment, support staff and site/side marked as required. Preparation: Patient was prepped and draped in the usual sterile fashion. Patient tolerance: Patient tolerated the procedure well with no immediate complications.  Comments: 16 french Gastrostomy tube placed without difficulty  Critical Care performed:  None ____________________________________________   INITIAL IMPRESSION / ASSESSMENT AND PLAN / ED COURSE  77 y.o. female with a history of head and neck cancer feeding tube dependent who presents for evaluation of feeding tube clogged since 3AM.  Patient is in no significant distress, abdomen is soft with no tenderness, vitals are within normal limits.  Tube looks clean with no residue and side.  Will attempt to declog using Coca[employer]Cola.  Clinical Course as of May 23 1339  Sat May 22, 2018  1151 Unable to declog tube. Spoke with Dr. Pabon who will evaluate patient in ED and exchange the tube.   [CV]    Clinical Course User Index [CV] Veronese, [place], MD   _________________________ 1:39 PM on 05/22/2018 -----------------------------------------  Dr. Pabon was busy in the OR therefore I exchanged the Gtube in the ED. XR confirms appropriate placement. Patient is now stable for dc.  Discussed tube care with husband.  As part of my medical decision making, I reviewed the following data within the electronic medical record:  Nursing notes reviewed and incorporated, Old chart reviewed, Notes from prior ED visits and [place] Controlled Substance Database    Pertinent labs & imaging results that were available during my care of the patient were reviewed by me and considered in my medical decision making (see chart for details).    ____________________________________________   FINAL CLINICAL  IMPRESSION(S) / ED DIAGNOSES  Final diagnoses:  Problem with gastrostomy tube (HCC)      NEW MEDICATIONS STARTED DURING THIS VISIT:  ED Discharge Orders    None       Note:  This document was prepared using Dragon voice recognition software and may include unintentional dictation errors.    Veronese, Rosine, MD 05/22/18 1340  

## 2018-05-23 DIAGNOSIS — C760 Malignant neoplasm of head, face and neck: Secondary | ICD-10-CM | POA: Diagnosis not present

## 2018-05-24 ENCOUNTER — Telehealth: Payer: Self-pay | Admitting: *Deleted

## 2018-05-24 ENCOUNTER — Other Ambulatory Visit: Payer: Self-pay | Admitting: Hospice and Palliative Medicine

## 2018-05-24 MED ORDER — MORPHINE SULFATE (CONCENTRATE) 20 MG/ML PO SOLN: 5.0000 mg | ORAL | 0 refills | Status: DC | PRN

## 2018-05-24 NOTE — Telephone Encounter (Signed)
Pharmacy will not fill prescription, they want a hard copy. They cancelled the prescription sent last week that escribed.

## 2018-05-30 NOTE — Progress Notes (Signed)
[facility]  [phone] [phone]  ID: Dana Perez OB: [birth year]  MR#: [id number]  CBJ#:[id number]  Patient Care Team: Glendon Axe, MD as PCP - General (Internal Medicine) [name], MD as Referring Physician (Surgery)  CHIEF COMPLAINT:  Recurrent, progressive stage IVa head and neck squamous cell carcinoma, adenocarcinoma of the colon.  INTERVAL HISTORY: Patient returns to clinic today for further evaluation and consideration of cycle 5 of palliative nivolumab.  She continues to have a significantly decreased performance status, but slowly has improved.  The mass on her neck appears to be smaller and her pain is better controlled.  She continues to have difficulty eating and uses her PEG tube for nearly 100% of her nutrition.  She has no neurologic complaints.  She denies any recent fevers. She has no chest pain or shortness of breath.  She has intermittent nausea and vomiting, but denies constipation or diarrhea.  She has no melena or hematochezia.  She has no urinary complaints.  Patient offers no further specific complaints today.  REVIEW OF SYSTEMS:   Review of Systems  Constitutional: Positive for malaise/fatigue. Negative for fever and weight loss.  HENT: Negative.  Negative for sore throat.   Respiratory: Negative.  Negative for cough, shortness of breath and stridor.   Cardiovascular: Negative.  Negative for chest pain and leg swelling.  Gastrointestinal: Positive for nausea and vomiting. Negative for abdominal pain and constipation.  Genitourinary: Negative.  Negative for dysuria.  Musculoskeletal: Positive for joint pain. Negative for falls.  Skin: Negative.  Negative for itching and rash.  Neurological: Positive for weakness. Negative for sensory change, focal weakness and headaches.  Psychiatric/Behavioral: Positive for depression and memory loss. Negative for hallucinations. The patient is nervous/anxious. The patient  does not have insomnia.     As per HPI. Otherwise, a complete review of systems is negative.  PAST MEDICAL HISTORY: Past Medical History:  Diagnosis Date  . Adenocarcinoma of colon ([place])   . Benign neoplasm of ascending colon   . Essential hypertension 06/13/2014  . Goals of care, counseling/discussion 07/14/2017  . HLD (hyperlipidemia) 06/13/2014  . Hypercholesteremia   . Hyperlipidemia   . Multiple thyroid nodules 01/22/2012  . Osteoporosis   . Polycythemia, secondary 12/26/2014  . Pure hypercholesterolemia 03/15/2015  . Squamous cell carcinoma of head and neck ([place])   . Thyroid nodule     PAST SURGICAL HISTORY: Past Surgical History:  Procedure Laterality Date  . ABDOMINAL HYSTERECTOMY    . COLONOSCOPY WITH PROPOFOL N/A 07/02/2017   Procedure: COLONOSCOPY WITH PROPOFOL;  Surgeon: Lucilla Lame, MD;  Location: [facility];  Service: Endoscopy;  Laterality: N/A;  . ORIF ANKLE FRACTURE Left 08/08/2017   Procedure: OPEN REDUCTION INTERNAL FIXATION (ORIF) ANKLE FRACTURE;  Surgeon: Dereck Leep, MD;  Location: [facility];  Service: Orthopedics;  Laterality: Left;  . POLYPECTOMY N/A 07/02/2017   Procedure: POLYPECTOMY;  Surgeon: Lucilla Lame, MD;  Location: [facility];  Service: Endoscopy;  Laterality: N/A;  . WRIST FRACTURE SURGERY  08/2009   badly broken    FAMILY HISTORY Family History  Problem Relation Age of Onset  . Heart disease Mother   . Diabetes Father        ADVANCED DIRECTIVES:    HEALTH MAINTENANCE: Social History   Tobacco Use  . Smoking status: Former Smoker    Packs/day: 0.25    Types: Cigarettes    Last attempt to quit: 06/17/2017    Years since quitting: 0.9  .  Smokeless tobacco: Never Used  Substance Use Topics  . Alcohol use: No  . Drug use: No     Allergies  Allergen Reactions  . Potassium Nausea Only and Nausea And Vomiting  . Other Other (See Comments) and Rash    TDAP tdap TDAP  . Tetanus Antitoxin Rash     Current Outpatient Medications  Medication Sig Dispense Refill  . calcium carbonate (OSCAL) 1500 (600 Ca) MG TABS tablet Take 600 mg of elemental calcium by mouth daily with breakfast.    . ENSURE (ENSURE) Take 237 mLs by mouth.    . fentaNYL (DURAGESIC - DOSED MCG/HR) 50 MCG/HR Place 50 mcg onto the skin every 3 (three) days.     Marland Kitchen ibuprofen (ADVIL,MOTRIN) 200 MG tablet Take 400 mg by mouth every 4 (four) hours as needed.    Marland Kitchen morphine (ROXANOL) 20 MG/ML concentrated solution Place 0.25 mLs (5 mg total) into feeding tube every 4 (four) hours as needed for moderate pain or severe pain. 30 mL 0  . NARCAN 4 MG/0.1ML LIQD nasal spray kit USE 1 SPRAY PER INTRANASAL ROUTE AS DIRECTED  0  . pravastatin (PRAVACHOL) 10 MG tablet Take 10 mg by mouth daily.    . promethazine (PHENERGAN) 25 MG tablet Take 25 mg by mouth every 6 (six) hours as needed for nausea or vomiting.    . Nutritional Supplements (PROMOD) LIQD Give 67m daily via feeding tube.   Mix promod with 30-673mof water Flush tube with 3029mf water, then add promod and water mixture to syringe and flush with additional 84m56m water. 946 mL 12   No current facility-administered medications for this visit.    Facility-Administered Medications Ordered in Other Visits  Medication Dose Route Frequency Provider Last Rate Last Dose  . heparin lock flush 100 unit/mL  250 Units Intracatheter PRN PandLeia Alf      . heparin lock flush 100 unit/mL  500 Units Intracatheter PRN PandLeia Alf      . potassium chloride 20 mEq in sodium chloride 0.9 % 500 mL injection   Intravenous Once SaunTye Savoyni H, RN      . sodium chloride 0.9 % injection 10 mL  10 mL Intracatheter PRN PandLeia Alf      . sodium chloride 0.9 % injection 10 mL  10 mL Intracatheter PRN PandMa Hillockndeep, MD      . sodium chloride 0.9 % injection 10 mL  10 mL Intracatheter PRN PandMa Hillockndeep, MD      . sodium chloride 0.9 % injection 10 mL  10 mL  Intracatheter PRN PandLeia Alf      -year-old  OBJECTIVE: Vitals:   06/03/18 0931 06/03/18 0936  BP:  98/65  Pulse:  (!) 105  Resp: 12   Temp:  (!) 97.2 F (36.2 C)     Body mass index is 18.01 kg/m.    ECOG FS:3 - Symptomatic, >50% confined to bed  General: Ill-appearing, no acute distress.  Sitting in wheelchair. Eyes: Pink conjunctiva, anicteric sclera. HEENT: Easily palpable mass on bilateral neck, decreased in size. Lungs: Clear to auscultation bilaterally. Heart: Regular rate and rhythm. No rubs, murmurs, or gallops. Abdomen: PEG tube noted.   Musculoskeletal: No edema, cyanosis, or clubbing. Neuro: Alert, answering all questions appropriately. Cranial nerves grossly intact. Skin: No rashes or petechiae noted. Psych: Normal affect.  LAB RESULTS:  Lab Results  Component Value Date   NA 135 06/03/2018   K 4.0 06/03/2018  CL 102 06/03/2018   CO2 24 06/03/2018   GLUCOSE 132 (H) 06/03/2018   BUN 21 06/03/2018   CREATININE 0.82 06/03/2018   CALCIUM 9.5 06/03/2018   PROT 6.8 06/03/2018   ALBUMIN 3.6 06/03/2018   AST 27 06/03/2018   ALT 14 06/03/2018   ALKPHOS 94 06/03/2018   BILITOT 0.3 06/03/2018   GFRNONAA >60 06/03/2018   GFRAA >60 06/03/2018    Lab Results  Component Value Date   WBC 8.1 06/03/2018   NEUTROABS 6.5 06/03/2018   HGB 12.7 06/03/2018   HCT 40.2 06/03/2018   MCV 83.6 06/03/2018   PLT 317 06/03/2018     STUDIES: Dg Abdomen 1 View  Result Date: 05/22/2018 CLINICAL DATA:  G-tube placement EXAM: ABDOMEN - 1 VIEW COMPARISON:  Portable exam 1319 hours compared to PET-CT 11/02/2017 FINDINGS: Contrast was injected through a gastrostomy tube. Catheter balloon is at the distal gastric antrum. Contrast opacifies the gastric lumen, duodenum and proximal jejunal loops. No definite contrast extravasation. IMPRESSION: Gastrostomy tube is at the distal gastric antrum. Electronically Signed   By: Lavonia Dana M.D.   On: 05/22/2018 13:33     ASSESSMENT: Recurrent, progressive stage IVa head and neck squamous cell carcinoma, adenocarcinoma of the colon.  PLAN:    1.  Recurrent, progressive stage IVa head and neck squamous cell carcinoma: Despite recent surgery at [facility] for residual disease, patient continues to have progressive malignancy.  Her performance status is significantly decreased.  Previously we discussed at length the possibility of enrolling in hospice, but patient ultimately decided she would like to try immunotherapy as one last effort for control of disease.  Patient will receive nivolumab every 2 weeks until intolerable side effects or progression of disease.  She expressed understanding that this is likely not curative.  Proceed with cycle 5 of nivolumab today.  Return to clinic in 2 weeks for further evaluation and consideration of cycle 6.   2.  Adenocarcinoma of the colon: No intervention is planned at this time.  Patient could not tolerate any further surgery.  CEA continues to be within normal limits at 2.6. 3.  Fractured ankles: Continue follow-up and treatment per orthopedics. 4.  Decreased performance status: Likely multifactorial.  Chronic and unchanged.  Continue home health and physical therapy.  Consider referral back to dietary in the near future.  Patient has declined hospice as above.  Appreciate palliative care input. 5.  Nausea: Continue current medications as prescribed. 6.  Pain: Continue current narcotic regimen. 7.  Hypotension/tachycardia: Improved.  Monitor. 8.  Hallucinations: Patient does not complain of this today.  Unclear etiology, but likely medication induced.  Monitor. 9.  PEG tube: Appreciate surgical input.  Continue tube feeds as directed.   I spent a total of 30 minutes face-to-face with the patient of which greater than 50% of the visit was spent in counseling and coordination of care as detailed above.     [name], MD 06/04/18 3:17 PM

## 2018-06-03 ENCOUNTER — Inpatient Hospital Stay: Payer: PPO

## 2018-06-03 ENCOUNTER — Inpatient Hospital Stay (HOSPITAL_BASED_OUTPATIENT_CLINIC_OR_DEPARTMENT_OTHER): Payer: PPO | Admitting: Oncology

## 2018-06-03 ENCOUNTER — Inpatient Hospital Stay (HOSPITAL_BASED_OUTPATIENT_CLINIC_OR_DEPARTMENT_OTHER): Payer: PPO | Admitting: Hospice and Palliative Medicine

## 2018-06-03 ENCOUNTER — Other Ambulatory Visit: Payer: Self-pay

## 2018-06-03 ENCOUNTER — Encounter: Payer: Self-pay | Admitting: Oncology

## 2018-06-03 VITALS — HR 94

## 2018-06-03 VITALS — BP 98/65 | HR 105 | Temp 97.2°F | Resp 12 | Ht 66.0 in | Wt 111.6 lb

## 2018-06-03 DIAGNOSIS — C760 Malignant neoplasm of head, face and neck: Secondary | ICD-10-CM

## 2018-06-03 DIAGNOSIS — Z8781 Personal history of (healed) traumatic fracture: Secondary | ICD-10-CM

## 2018-06-03 DIAGNOSIS — R443 Hallucinations, unspecified: Secondary | ICD-10-CM | POA: Diagnosis not present

## 2018-06-03 DIAGNOSIS — G893 Neoplasm related pain (acute) (chronic): Secondary | ICD-10-CM

## 2018-06-03 DIAGNOSIS — I959 Hypotension, unspecified: Secondary | ICD-10-CM

## 2018-06-03 DIAGNOSIS — C189 Malignant neoplasm of colon, unspecified: Secondary | ICD-10-CM | POA: Diagnosis not present

## 2018-06-03 DIAGNOSIS — Z87891 Personal history of nicotine dependence: Secondary | ICD-10-CM | POA: Diagnosis not present

## 2018-06-03 DIAGNOSIS — Z5112 Encounter for antineoplastic immunotherapy: Secondary | ICD-10-CM | POA: Diagnosis not present

## 2018-06-03 DIAGNOSIS — R110 Nausea: Secondary | ICD-10-CM | POA: Diagnosis not present

## 2018-06-03 DIAGNOSIS — Z931 Gastrostomy status: Secondary | ICD-10-CM | POA: Diagnosis not present

## 2018-06-03 DIAGNOSIS — Z515 Encounter for palliative care: Secondary | ICD-10-CM

## 2018-06-03 DIAGNOSIS — R000 Tachycardia, unspecified: Secondary | ICD-10-CM | POA: Diagnosis not present

## 2018-06-03 LAB — COMPREHENSIVE METABOLIC PANEL
ALBUMIN: 3.6 g/dL (ref 3.5–5.0)
ALT: 14 U/L (ref 0–44)
ANION GAP: 9 (ref 5–15)
AST: 27 U/L (ref 15–41)
Alkaline Phosphatase: 94 U/L (ref 38–126)
BUN: 21 mg/dL (ref 8–23)
CHLORIDE: 102 mmol/L (ref 98–111)
CO2: 24 mmol/L (ref 22–32)
CREATININE: 0.82 mg/dL (ref 0.44–1.00)
Calcium: 9.5 mg/dL (ref 8.9–10.3)
GFR calc non Af Amer: >60 mL/min (ref >60)
Glucose, Bld: 132 mg/dL — ABNORMAL HIGH (ref 70–99)
Potassium: 4 mmol/L (ref 3.5–5.1)
SODIUM: 135 mmol/L (ref 135–145)
Total Bilirubin: 0.3 mg/dL (ref 0.3–1.2)
Total Protein: 6.8 g/dL (ref 6.5–8.1)

## 2018-06-03 LAB — CBC WITH DIFFERENTIAL/PLATELET
ABS IMMATURE GRANULOCYTES: 0.03 10*3/uL (ref 0.00–0.07)
BASOS ABS: 0 10*3/uL (ref 0.0–0.1)
Basophils Relative: 0 %
EOS PCT: 1 %
Eosinophils Absolute: 0.1 10*3/uL (ref 0.0–0.5)
HEMATOCRIT: 40.2 % (ref 36.0–46.0)
HEMOGLOBIN: 12.7 g/dL (ref 12.0–15.0)
Immature Granulocytes: 0 %
LYMPHS PCT: 14 %
Lymphs Abs: 1.2 10*3/uL (ref 0.7–4.0)
MCH: 26.4 pg (ref 26.0–34.0)
MCHC: 31.6 g/dL (ref 30.0–36.0)
MCV: 83.6 fL (ref 80.0–100.0)
Monocytes Absolute: 0.4 10*3/uL (ref 0.1–1.0)
Monocytes Relative: 5 %
NEUTROS ABS: 6.5 10*3/uL (ref 1.7–7.7)
NRBC: 0 % (ref 0.0–0.2)
Neutrophils Relative %: 80 %
Platelets: 317 10*3/uL (ref 150–400)
RBC: 4.81 MIL/uL (ref 3.87–5.11)
RDW: 19.5 % — ABNORMAL HIGH (ref 11.5–15.5)
WBC: 8.1 10*3/uL (ref 4.0–10.5)

## 2018-06-03 MED ORDER — SODIUM CHLORIDE 0.9 % IV SOLN
240.0000 mg | Freq: Once | INTRAVENOUS | Status: AC
  Administered 2018-06-03: 240 mg via INTRAVENOUS
  Filled 2018-06-03: qty 24

## 2018-06-03 MED ORDER — PROMOD PO LIQD: ORAL | 12 refills | Status: DC

## 2018-06-03 MED ORDER — SODIUM CHLORIDE 0.9 % IV SOLN
Freq: Once | INTRAVENOUS | Status: AC
  Administered 2018-06-03: 11:00:00 via INTRAVENOUS
  Filled 2018-06-03: qty 250

## 2018-06-03 NOTE — Progress Notes (Signed)
Patient here for pre treatment check. She has been having increased skin sloughing in her mouth. Morphine has been added for breakthrough  Pain.

## 2018-06-03 NOTE — Progress Notes (Signed)
Nutrition Follow-up:  Patient with stage IV head and neck cancer and adenocarcinoma of colon.  Patient followed by Dr. Grayland Ormond. Patient s/p g-tube placement on 6/7.  Noted clogging of feeding tube recently with tube exchange done in ED.    Met with patient and husband during infusion today.  Husband reports that patient is doing ok. Continues to give ensure plus 4 times per day via feeding tube with 35m free water before and after each feeding.  Also gives medications at 7am, 12 and sometimes around 7:30pm at night with water flush.  Husband reports regular bowel movement, typically 1 time per day.    Patient continues to nibble on soft foods but main nutrition coming from feeding tube.    No nutrition impact symptoms reported today from husband.     Medications: reviewed  Labs: glucose 132  Anthropometrics:   Weight today 111 lb up from 108 lb on 10/5 (?? If heavy coat on when weighed)  Weight 109-110 since surgery in May 2019.  Prior to surgery weight was 116-120s   Estimated Energy Needs  Kcals: 1250-1500 calories Protein: 62-75 g/d Fluid: 1.5 L/d  NUTRITION DIAGNOSIS: Inadequate oral intake poor with resulting feeding tube   INTERVENTION:  Recommend for patient to continues with ensure plus 4 cartons per day via feeding tube with water flush 643mbefore and after each feeding. Recommend adding promod 3040maily for additional protein to better meet protein needs. Discussed with husband and agreeable to try. Will contact [facility] feeding regimen will provide 1500 calorie, 62 g protein and 1320m17mter (plus additional water with medications).      MONITORING, EVALUATION, GOAL: weight trends, intake, TF tolerance   NEXT VISIT: Thursday, October 31   Nishanth Mccaughan B. AlleZenia Resides, GibbonN Winfieldistered Dietitian [phone])

## 2018-06-03 NOTE — Progress Notes (Signed)
[facility]  [phone] [phone]   Name: Dana Perez Date: 06/03/2018 MRN: [id number]  DOB: 04/05/1941  Patient Care Team: Glendon Axe, MD as PCP - General (Internal Medicine) [name], MD as Referring Physician (Surgery)    REASON FOR CONSULTATION: Palliative Care consult requested for this 77 y.o. 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  PMH also includes adenocarcinoma of the colon not currently on treatment, malnutrition status post PEG insertion, she had recent bilateral ankle fractures causing a marked reduction in her performance status, chronic pain on transdermal fentanyl, hypertension, hyperlipidemia, and CKD stage III.  Patient has had disease progression and general decline despite ongoing treatment of her head & neck cancer.  Hospice had been discussed but patient/husband opted to continue treatment with nivolumab.  Palliative care is now asked to follow and provide her with support.   SOCIAL HISTORY:  Patient is married.  She lives at home with her husband.  She has no children.  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  ADVANCE DIRECTIVES:  Patient does not currently have advance directives.  She would want her husband to be her decision-maker.  Patient and husband have talked about completing a living will but have not currently done so.  CODE STATUS: DNR  PAST MEDICAL HISTORY: Past Medical History:  Diagnosis Date  . Adenocarcinoma of colon ([place])   . Benign neoplasm of ascending colon   . Essential hypertension 06/13/2014  . Goals of care, counseling/discussion 07/14/2017  . HLD (hyperlipidemia) 06/13/2014  . Hypercholesteremia   . Hyperlipidemia     . Multiple thyroid nodules 01/22/2012  . Osteoporosis   . Polycythemia, secondary 12/26/2014  . Pure hypercholesterolemia 03/15/2015  . Squamous cell carcinoma of head and neck ([place])   . Thyroid nodule     PAST SURGICAL HISTORY:  Past Surgical History:  Procedure Laterality Date  . ABDOMINAL HYSTERECTOMY    . COLONOSCOPY WITH PROPOFOL N/A 07/02/2017   Procedure: COLONOSCOPY WITH PROPOFOL;  Surgeon: Lucilla Lame, MD;  Location: [facility];  Service: Endoscopy;  Laterality: N/A;  . ORIF ANKLE FRACTURE Left 08/08/2017   Procedure: OPEN REDUCTION INTERNAL FIXATION (ORIF) ANKLE FRACTURE;  Surgeon: Dereck Leep, MD;  Location: [facility];  Service: Orthopedics;  Laterality: Left;  . POLYPECTOMY N/A 07/02/2017   Procedure: POLYPECTOMY;  Surgeon: Lucilla Lame, MD;  Location: [place] [place];  Service: Endoscopy;  Laterality: N/A;  . WRIST FRACTURE SURGERY  08/2009   badly broken    HEMATOLOGY/ONCOLOGY HISTORY:    Squamous cell carcinoma of head and neck ([place])   07/01/2017 Initial Diagnosis    Squamous cell carcinoma of head and neck ([place])    04/02/2018 -  Chemotherapy    The patient had nivolumab (OPDIVO) 240 mg in sodium chloride 0.9 % 100 mL chemo infusion, 240 mg, Intravenous, Once, 4 of 6 cycles Administration: 240 mg (04/08/2018), 240 mg (04/22/2018), 240 mg (05/06/2018), 240 mg (05/20/2018)  for chemotherapy treatment.      ALLERGIES:  is allergic to potassium; other; and tetanus antitoxin.  MEDICATIONS:  Current Outpatient Medications  Medication Sig Dispense Refill  . calcium carbonate (OSCAL) 1500 (600 Ca) MG TABS tablet Take 600 mg of elemental calcium by mouth daily with breakfast.    . ENSURE (  ENSURE) Take 237 mLs by mouth.    . fentaNYL (DURAGESIC - DOSED MCG/HR) 50 MCG/HR Place 50 mcg onto the skin every 3 (three) days.     Marland Kitchen ibuprofen (ADVIL,MOTRIN) 200 MG tablet Take 400 mg by mouth every 4 (four) hours as needed.    Marland Kitchen morphine (ROXANOL) 20 MG/ML concentrated  solution Place 0.25 mLs (5 mg total) into feeding tube every 4 (four) hours as needed for moderate pain or severe pain. 30 mL 0  . NARCAN 4 MG/0.1ML LIQD nasal spray kit USE 1 SPRAY PER INTRANASAL ROUTE AS DIRECTED  0  . pravastatin (PRAVACHOL) 10 MG tablet Take 10 mg by mouth daily.    . promethazine (PHENERGAN) 25 MG tablet Take 25 mg by mouth every 6 (six) hours as needed for nausea or vomiting.     No current facility-administered medications for this visit.    Facility-Administered Medications Ordered in Other Visits  Medication Dose Route Frequency Provider Last Rate Last Dose  . heparin lock flush 100 unit/mL  250 Units Intracatheter PRN Leia Alf, MD      . heparin lock flush 100 unit/mL  500 Units Intracatheter PRN Leia Alf, MD      . potassium chloride 20 mEq in sodium chloride 0.9 % 500 mL injection   Intravenous Once Tye Savoy, Doni H, RN      . sodium chloride 0.9 % injection 10 mL  10 mL Intracatheter PRN Leia Alf, MD      . sodium chloride 0.9 % injection 10 mL  10 mL Intracatheter PRN Leia Alf, MD      . sodium chloride 0.9 % injection 10 mL  10 mL Intracatheter PRN Ma Hillock, Sandeep, MD      . sodium chloride 0.9 % injection 10 mL  10 mL Intracatheter PRN Leia Alf, MD        VITAL SIGNS: There were no vitals taken for this visit. There were no vitals filed for this visit.  Estimated body mass index is 18.01 kg/m as calculated from the following:   Height as of an earlier encounter on 06/03/18: 5' 6"  (1.676 m).   Weight as of an earlier encounter on 06/03/18: 111 lb 9.6 oz (50.6 kg).  LABS: CBC:    Component Value Date/Time   WBC 8.1 06/03/2018 0912   HGB 12.7 06/03/2018 0912   HCT 40.2 06/03/2018 0912   PLT 317 06/03/2018 0912   MCV 83.6 06/03/2018 0912   NEUTROABS 6.5 06/03/2018 0912   LYMPHSABS 1.2 06/03/2018 0912   MONOABS 0.4 06/03/2018 0912   EOSABS 0.1 06/03/2018 0912   BASOSABS 0.0 06/03/2018 0912   Comprehensive  Metabolic Panel:    Component Value Date/Time   NA 135 06/03/2018 0912   K 4.0 06/03/2018 0912   CL 102 06/03/2018 0912   CO2 24 06/03/2018 0912   BUN 21 06/03/2018 0912   CREATININE 0.82 06/03/2018 0912   GLUCOSE 132 (H) 06/03/2018 0912   CALCIUM 9.5 06/03/2018 0912   AST 27 06/03/2018 0912   ALT 14 06/03/2018 0912   ALKPHOS 94 06/03/2018 0912   BILITOT 0.3 06/03/2018 0912   PROT 6.8 06/03/2018 0912   ALBUMIN 3.6 06/03/2018 0912    RADIOGRAPHIC STUDIES: Dg Abdomen 1 View  Result Date: 05/22/2018 CLINICAL DATA:  G-tube placement EXAM: ABDOMEN - 1 VIEW COMPARISON:  Portable exam 1319 hours compared to PET-CT 11/02/2017 FINDINGS: Contrast was injected through a gastrostomy tube. Catheter balloon is at the distal gastric antrum. Contrast opacifies the gastric  lumen, duodenum and proximal jejunal loops. No definite contrast extravasation. IMPRESSION: Gastrostomy tube is at the distal gastric antrum. Electronically Signed   By: Lavonia Dana M.D.   On: 05/22/2018 13:33    PERFORMANCE STATUS (ECOG) : 3 - Symptomatic, >50% confined to bed  Review of Systems As noted above. Otherwise, a complete review of systems is negative.  Physical Exam General: ill appearing, in wheelchair HEENT: necrotic appearing neck mass, oral mucosa dry and flaky Lungs: Clear to auscultation bilaterally. Heart: Regular rate and rhythm. Abdomen: Soft, nontender, PEG noted but not visualized Musculoskeletal: No edema, cyanosis, or clubbing. Skin: multiple circular lesions to r. Shoulder, back, and chest. Concentric with slough-like center Neuro: Alert, answers some questions but speaking is difficult   IMPRESSION: Follow-up visit today with patient and husband in the clinic.  Husband reports that patient is essentially unchanged since last seen.  He reports pain is improved with as needed use of morphine elixir.  Medication use instructions were reviewed again today.  He is using the morphine once daily,  primarily at night to help patient sleep.  He recognizes that he could use this every 4-6 hours as needed.  Patient continues to have intermittent hallucinations.  Husband reports that they are short-lived and that she is typically able to be redirected without prolonged agitation.  Etiology is unclear.  Medication use could be a factor.  The hallucinations predated starting morphine.  Husband says he will let us know if the hallucinations are significantly affecting patient's quality of life.  Would then consider starting an antipsychotic as needed.  We discussed patient's oral care.  She continues to have dry, flaky oral mucosa.  Husband is using Biotene.  Recommend oral care every 4 hours.  Discussed trial of hydrogen peroxide/water and a one-to-one solution with a soft bristle toothbrush or oral sponge.  Patient has multiple skin lesions of unclear etiology.  Results of biopsy reviewed.  Biopsy revealed mild epidermal hyperplasia, spongiosis, and dermal inflammation.  These lesions do not appear to be painful or pruritic.  Husband says appear to be resolving on their own.  Patient appears otherwise stable.  She is speaking with Dr. Grayland Ormond today for consideration of treatment.  Long-term prognosis is poor.  Will continue to follow in the palliative care clinic.  PLAN: 1.  DNR 2.  Continue morphine elixir 5 mg every 4 6 hours as needed for pain 3.  Continue transdermal fentanyl 50 mcg every 72 hours 4.  Recommend oral care with hydrogen peroxide/water 1: 1 solution every 4 hours 5.  Consider starting as needed antipsychotic should hallucinations persist and distress patient or husband   Patient expressed understanding and was in agreement with this plan. She also understands that She can call clinic at any time with any questions, concerns, or complaints.    Time Total: 15 minutes  Visit consisted of counseling and education dealing with the complex and emotionally intense issues of  symptom management and palliative care in the setting of serious and potentially life-threatening illness.Greater than 50%  of this time was spent counseling and coordinating care related to the above assessment and plan.  Signed by: Altha Harm, [place], NP-C, [place] ([facility])

## 2018-06-03 NOTE — Addendum Note (Signed)
Addended by: Jennet Maduro B on: 06/03/2018 03:13 PM   Modules accepted: Orders

## 2018-06-04 LAB — THYROID PANEL WITH TSH
Free Thyroxine Index: 2.2 (ref 1.2–4.9)
T3 Uptake Ratio: 25 % (ref 24–39)
T4 TOTAL: 8.7 ug/dL (ref 4.5–12.0)
TSH: 1.72 u[IU]/mL (ref 0.450–4.500)

## 2018-06-14 NOTE — Progress Notes (Signed)
[facility]  [phone] [phone]) [phone]  ID: Dana Perez OB: 04-May-1941  MR#: [id number]  QIW#:[id number]  Patient Care Team: Glendon Axe, MD as PCP - General (Internal Medicine) [name], MD as Referring Physician (Surgery)  CHIEF COMPLAINT:  Recurrent, progressive stage IVa head and neck squamous cell carcinoma, adenocarcinoma of the colon.  INTERVAL HISTORY: Patient returns to clinic today for further evaluation and consideration of cycle 6 of palliative nivolumab.  She has had worsening diarrhea over the past 1 to 2 weeks. She continues to have a significantly decreased performance status, but slowly has improved.  The mass on her neck appears to be smaller and her pain is better controlled.  She continues to have difficulty eating and uses her PEG tube for nearly 100% of her nutrition.  She has no neurologic complaints.  She denies any recent fevers. She has no chest pain or shortness of breath. She has no urinary complaints.  Patient offers no further specific complaints today.  REVIEW OF SYSTEMS:   Review of Systems  Constitutional: Positive for malaise/fatigue. Negative for fever and weight loss.  HENT: Negative.  Negative for sore throat.   Respiratory: Negative.  Negative for cough, shortness of breath and stridor.   Cardiovascular: Negative.  Negative for chest pain and leg swelling.  Gastrointestinal: Positive for diarrhea, nausea and vomiting. Negative for abdominal pain and constipation.  Genitourinary: Negative.  Negative for dysuria.  Musculoskeletal: Positive for joint pain. Negative for falls.  Skin: Negative.  Negative for itching and rash.  Neurological: Positive for weakness. Negative for sensory change, focal weakness and headaches.  Psychiatric/Behavioral: Positive for depression and memory loss. Negative for hallucinations. The patient is nervous/anxious. The patient does not have insomnia.     As per HPI.  Otherwise, a complete review of systems is negative.  PAST MEDICAL HISTORY: Past Medical History:  Diagnosis Date  . Adenocarcinoma of colon ([place])   . Benign neoplasm of ascending colon   . Essential hypertension 06/13/2014  . Goals of care, counseling/discussion 07/14/2017  . HLD (hyperlipidemia) 06/13/2014  . Hypercholesteremia   . Hyperlipidemia   . Multiple thyroid nodules 01/22/2012  . Osteoporosis   . Polycythemia, secondary 12/26/2014  . Pure hypercholesterolemia 03/15/2015  . Squamous cell carcinoma of head and neck ([place])   . Thyroid nodule     PAST SURGICAL HISTORY: Past Surgical History:  Procedure Laterality Date  . ABDOMINAL HYSTERECTOMY    . COLONOSCOPY WITH PROPOFOL N/A 07/02/2017   Procedure: COLONOSCOPY WITH PROPOFOL;  Surgeon: Lucilla Lame, MD;  Location: [place];  Service: Endoscopy;  Laterality: N/A;  . ORIF ANKLE FRACTURE Left 08/08/2017   Procedure: OPEN REDUCTION INTERNAL FIXATION (ORIF) ANKLE FRACTURE;  Surgeon: Dereck Leep, MD;  Location: [facility];  Service: Orthopedics;  Laterality: Left;  . POLYPECTOMY N/A 07/02/2017   Procedure: POLYPECTOMY;  Surgeon: Lucilla Lame, MD;  Location: [place];  Service: Endoscopy;  Laterality: N/A;  . WRIST FRACTURE SURGERY  08/2009   badly broken    FAMILY HISTORY Family History  Problem Relation Age of Onset  . Heart disease Mother   . Diabetes Father        ADVANCED DIRECTIVES:    HEALTH MAINTENANCE: Social History   Tobacco Use  . Smoking status: Former Smoker    Packs/day: 0.25    Types: Cigarettes    Last attempt to quit: 06/17/2017    Years since quitting: 1.0  . Smokeless tobacco: Never Used  Substance  Use Topics  . Alcohol use: No  . Drug use: No     Allergies  Allergen Reactions  . Potassium Nausea Only and Nausea And Vomiting  . Other Other (See Comments) and Rash    TDAP tdap TDAP  . Tetanus Antitoxin Rash    Current Outpatient Medications  Medication Sig  Dispense Refill  . calcium carbonate (OSCAL) 1500 (600 Ca) MG TABS tablet Take 600 mg of elemental calcium by mouth daily with breakfast.    . ENSURE (ENSURE) Take 237 mLs by mouth.    . fentaNYL (DURAGESIC - DOSED MCG/HR) 50 MCG/HR Place 50 mcg onto the skin every 3 (three) days.     Marland Kitchen ibuprofen (ADVIL,MOTRIN) 200 MG tablet Take 400 mg by mouth every 4 (four) hours as needed.    Marland Kitchen morphine (ROXANOL) 20 MG/ML concentrated solution Place 0.25 mLs (5 mg total) into feeding tube every 4 (four) hours as needed for moderate pain or severe pain. 30 mL 0  . NARCAN 4 MG/0.1ML LIQD nasal spray kit USE 1 SPRAY PER INTRANASAL ROUTE AS DIRECTED  0  . Nutritional Supplements (PROMOD) LIQD Give 73m daily via feeding tube.   Mix promod with 30-635mof water Flush tube with 3026mf water, then add promod and water mixture to syringe and flush with additional 41m46m water. 946 mL 12  . pravastatin (PRAVACHOL) 10 MG tablet Take 10 mg by mouth daily.    . promethazine (PHENERGAN) 25 MG tablet Take 25 mg by mouth every 6 (six) hours as needed for nausea or vomiting.    . predniSONE (DELTASONE) 50 MG tablet Take one tablet daily for 7 days 7 tablet 0   No current facility-administered medications for this visit.    Facility-Administered Medications Ordered in Other Visits  Medication Dose Route Frequency Provider Last Rate Last Dose  . heparin lock flush 100 unit/mL  250 Units Intracatheter PRN PandLeia Alf      . heparin lock flush 100 unit/mL  500 Units Intracatheter PRN PandLeia Alf      . potassium chloride 20 mEq in sodium chloride 0.9 % 500 mL injection   Intravenous Once SaunTye Savoyni H, RN      . sodium chloride 0.9 % injection 10 mL  10 mL Intracatheter PRN PandLeia Alf      . sodium chloride 0.9 % injection 10 mL  10 mL Intracatheter PRN PandLeia Alf      . sodium chloride 0.9 % injection 10 mL  10 mL Intracatheter PRN PandMa Hillockndeep, MD      . sodium chloride 0.9 %  injection 10 mL  10 mL Intracatheter PRN PandLeia Alf      -year-old  OBJECTIVE: Vitals:   06/17/18 0908  BP: 134/79  Pulse: 95  Resp: 20  Temp: (!) 97.5 F (36.4 C)     Body mass index is 16.72 kg/m.    ECOG FS:3 - Symptomatic, >50% confined to bed  General: Ill-appearing, no acute distress.  Sitting in a wheelchair. Eyes: Pink conjunctiva, anicteric sclera. HEENT: Neck mass significantly reduced in size.   Lungs: Clear to auscultation bilaterally. Heart: Regular rate and rhythm. No rubs, murmurs, or gallops. Abdomen: Soft, nontender, nondistended. No organomegaly noted, normoactive bowel sounds. Musculoskeletal: No edema, cyanosis, or clubbing. Neuro: Alert, answering all questions appropriately. Cranial nerves grossly intact. Skin: No rashes or petechiae noted. Psych: Flat affect.  LAB RESULTS:  Lab Results  Component Value Date   NA 143  06/17/2018   K 4.0 06/17/2018   CL 109 06/17/2018   CO2 21 (L) 06/17/2018   GLUCOSE 98 06/17/2018   BUN 22 06/17/2018   CREATININE 0.96 06/17/2018   CALCIUM 9.6 06/17/2018   PROT 7.2 06/17/2018   ALBUMIN 3.9 06/17/2018   AST 27 06/17/2018   ALT 17 06/17/2018   ALKPHOS 85 06/17/2018   BILITOT 0.6 06/17/2018   GFRNONAA 56 (L) 06/17/2018   GFRAA >60 06/17/2018    Lab Results  Component Value Date   WBC 7.9 06/17/2018   NEUTROABS 6.3 06/17/2018   HGB 13.3 06/17/2018   HCT 41.8 06/17/2018   MCV 85.5 06/17/2018   PLT 324 06/17/2018     STUDIES: Dg Abdomen 1 View  Result Date: 05/22/2018 CLINICAL DATA:  G-tube placement EXAM: ABDOMEN - 1 VIEW COMPARISON:  Portable exam 1319 hours compared to PET-CT 11/02/2017 FINDINGS: Contrast was injected through a gastrostomy tube. Catheter balloon is at the distal gastric antrum. Contrast opacifies the gastric lumen, duodenum and proximal jejunal loops. No definite contrast extravasation. IMPRESSION: Gastrostomy tube is at the distal gastric antrum. Electronically Signed   By: Lavonia Dana M.D.   On: 05/22/2018 13:33    ASSESSMENT: Recurrent, progressive stage IVa head and neck squamous cell carcinoma, adenocarcinoma of the colon.  PLAN:    1.  Recurrent, progressive stage IVa head and neck squamous cell carcinoma: Despite recent surgery [facility] for residual disease, patient continues to have progressive malignancy.  Her performance status is significantly decreased.  Previously we discussed at length the possibility of enrolling in hospice, but patient ultimately decided she would like to try immunotherapy as one last effort for control of disease.  Patient will receive nivolumab every 2 weeks until intolerable side effects or progression of disease.  She expressed understanding that this is likely not curative.  Because of significant diarrhea, will delay cycle 6 of nivolumab today.  Return to clinic in 1 week for further evaluation and reconsideration of cycle 6.    2.  Adenocarcinoma of the colon: No intervention is planned at this time.  Patient could not tolerate any further surgery.  Patient's most recent CEA continues to be within normal limits at 2.6. 3.  Fractured ankles: Continue follow-up and treatment per orthopedics. 4.  Decreased performance status: Likely multifactorial.  Chronic and unchanged.  Continue home health and physical therapy.  Consider referral back to dietary in the near future.  Patient has declined hospice as above.  Appreciate palliative care input. 5.  Nausea: Continue current medications as prescribed. 6.  Pain: Continue current narcotic regimen. 7.  Hypotension/tachycardia: Resolved. 8.  Hallucinations: Patient does not complain of this today.  Unclear etiology, but likely medication induced.  Monitor. 9.  PEG tube: Appreciate surgical input.  Continue tube feeds as directed. 10.  Diarrhea: Likely secondary to immunotherapy.  Patient was given a burst dose of 50 mg prednisone daily which is approximately 1 mg/kg.  She also received 1 L IV fluids  today.  Return to clinic as above.  Patient and her husband expressed understanding that they can call or return to clinic at any time if they have any questions, concerns, or complaints.  [name], MD 06/19/18 7:29 AM

## 2018-06-17 ENCOUNTER — Inpatient Hospital Stay (HOSPITAL_BASED_OUTPATIENT_CLINIC_OR_DEPARTMENT_OTHER): Payer: PPO | Admitting: Oncology

## 2018-06-17 ENCOUNTER — Encounter: Payer: Self-pay | Admitting: Oncology

## 2018-06-17 ENCOUNTER — Inpatient Hospital Stay: Payer: PPO

## 2018-06-17 ENCOUNTER — Inpatient Hospital Stay (HOSPITAL_BASED_OUTPATIENT_CLINIC_OR_DEPARTMENT_OTHER): Payer: PPO | Admitting: Hospice and Palliative Medicine

## 2018-06-17 VITALS — BP 134/79 | HR 95 | Temp 97.5°F | Resp 20 | Wt 103.6 lb

## 2018-06-17 DIAGNOSIS — C760 Malignant neoplasm of head, face and neck: Secondary | ICD-10-CM

## 2018-06-17 DIAGNOSIS — G893 Neoplasm related pain (acute) (chronic): Secondary | ICD-10-CM

## 2018-06-17 DIAGNOSIS — I10 Essential (primary) hypertension: Secondary | ICD-10-CM | POA: Diagnosis not present

## 2018-06-17 DIAGNOSIS — Z931 Gastrostomy status: Secondary | ICD-10-CM | POA: Diagnosis not present

## 2018-06-17 DIAGNOSIS — Z5112 Encounter for antineoplastic immunotherapy: Secondary | ICD-10-CM | POA: Diagnosis not present

## 2018-06-17 DIAGNOSIS — Z515 Encounter for palliative care: Secondary | ICD-10-CM | POA: Diagnosis not present

## 2018-06-17 DIAGNOSIS — E860 Dehydration: Secondary | ICD-10-CM

## 2018-06-17 DIAGNOSIS — R443 Hallucinations, unspecified: Secondary | ICD-10-CM

## 2018-06-17 DIAGNOSIS — Z8781 Personal history of (healed) traumatic fracture: Secondary | ICD-10-CM

## 2018-06-17 DIAGNOSIS — Z87891 Personal history of nicotine dependence: Secondary | ICD-10-CM | POA: Diagnosis not present

## 2018-06-17 DIAGNOSIS — C189 Malignant neoplasm of colon, unspecified: Secondary | ICD-10-CM

## 2018-06-17 DIAGNOSIS — R197 Diarrhea, unspecified: Secondary | ICD-10-CM | POA: Diagnosis not present

## 2018-06-17 DIAGNOSIS — D751 Secondary polycythemia: Secondary | ICD-10-CM

## 2018-06-17 LAB — COMPREHENSIVE METABOLIC PANEL
ALBUMIN: 3.9 g/dL (ref 3.5–5.0)
ALT: 17 U/L (ref 0–44)
ANION GAP: 13 (ref 5–15)
AST: 27 U/L (ref 15–41)
Alkaline Phosphatase: 85 U/L (ref 38–126)
BILIRUBIN TOTAL: 0.6 mg/dL (ref 0.3–1.2)
BUN: 22 mg/dL (ref 8–23)
CALCIUM: 9.6 mg/dL (ref 8.9–10.3)
CO2: 21 mmol/L — ABNORMAL LOW (ref 22–32)
Chloride: 109 mmol/L (ref 98–111)
Creatinine, Ser: 0.96 mg/dL (ref 0.44–1.00)
GFR, EST NON AFRICAN AMERICAN: 56 mL/min — AB (ref >60)
Glucose, Bld: 98 mg/dL (ref 70–99)
POTASSIUM: 4 mmol/L (ref 3.5–5.1)
Sodium: 143 mmol/L (ref 135–145)
TOTAL PROTEIN: 7.2 g/dL (ref 6.5–8.1)

## 2018-06-17 LAB — CBC WITH DIFFERENTIAL/PLATELET
ABS IMMATURE GRANULOCYTES: 0.02 10*3/uL (ref 0.00–0.07)
BASOS PCT: 0 %
Basophils Absolute: 0 10*3/uL (ref 0.0–0.1)
EOS ABS: 0.1 10*3/uL (ref 0.0–0.5)
Eosinophils Relative: 1 %
HCT: 41.8 % (ref 36.0–46.0)
Hemoglobin: 13.3 g/dL (ref 12.0–15.0)
IMMATURE GRANULOCYTES: 0 %
Lymphocytes Relative: 14 %
Lymphs Abs: 1.1 10*3/uL (ref 0.7–4.0)
MCH: 27.2 pg (ref 26.0–34.0)
MCHC: 31.8 g/dL (ref 30.0–36.0)
MCV: 85.5 fL (ref 80.0–100.0)
Monocytes Absolute: 0.4 10*3/uL (ref 0.1–1.0)
Monocytes Relative: 5 %
NEUTROS PCT: 80 %
NRBC: 0 % (ref 0.0–0.2)
Neutro Abs: 6.3 10*3/uL (ref 1.7–7.7)
PLATELETS: 324 10*3/uL (ref 150–400)
RBC: 4.89 MIL/uL (ref 3.87–5.11)
RDW: 18.1 % — AB (ref 11.5–15.5)
WBC: 7.9 10*3/uL (ref 4.0–10.5)

## 2018-06-17 MED ORDER — SODIUM CHLORIDE 0.9 % IV SOLN: Freq: Once | INTRAVENOUS | Status: DC

## 2018-06-17 MED ORDER — DEXAMETHASONE SODIUM PHOSPHATE 10 MG/ML IJ SOLN
10.0000 mg | Freq: Once | INTRAMUSCULAR | Status: AC
  Administered 2018-06-17: 10 mg via INTRAVENOUS
  Filled 2018-06-17: qty 1

## 2018-06-17 MED ORDER — SODIUM CHLORIDE 0.9 % IV SOLN
Freq: Once | INTRAVENOUS | Status: AC
  Administered 2018-06-17: 10:00:00 via INTRAVENOUS
  Filled 2018-06-17: qty 250

## 2018-06-17 MED ORDER — PREDNISONE 50 MG PO TABS: ORAL_TABLET | ORAL | 0 refills | Status: DC

## 2018-06-17 NOTE — Progress Notes (Signed)
[facility]  Telephone:([phone] [phone]   Name: Dana Perez Date: 06/17/2018 MRN: [id number]  DOB: 13-Dec-1940  Patient Care Team: Glendon Axe, MD as PCP - General (Internal Medicine) [name], MD as Referring Physician (Surgery)    REASON FOR CONSULTATION: Palliative Care consult requested for this 77 y.o. 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  PMH also includes adenocarcinoma of the colon not currently on treatment, malnutrition status post PEG insertion, she had recent bilateral ankle fractures causing a marked reduction in her performance status, chronic pain on transdermal fentanyl, hypertension, hyperlipidemia, and CKD stage III.  Patient has had disease progression and general decline despite ongoing treatment of her head & neck cancer.  Hospice had been discussed but patient/husband opted to continue treatment with nivolumab.  Palliative care is now asked to follow and provide her with support.   SOCIAL HISTORY:  Patient is married.  She lives at home with her husband.  She has no children.  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  ADVANCE DIRECTIVES:  Patient does not currently have advance directives.  She would want her husband to be her decision-maker.  Patient and husband have talked about completing a living will but have not currently done so.  CODE STATUS: DNR  PAST MEDICAL HISTORY: Past Medical History:  Diagnosis Date  . Adenocarcinoma of colon ([place])   . Benign neoplasm of ascending colon   . Essential hypertension 06/13/2014  . Goals of care, counseling/discussion 07/14/2017  . HLD (hyperlipidemia) 06/13/2014  . Hypercholesteremia   . Hyperlipidemia     . Multiple thyroid nodules 01/22/2012  . Osteoporosis   . Polycythemia, secondary 12/26/2014  . Pure hypercholesterolemia 03/15/2015  . Squamous cell carcinoma of head and neck ([place])   . Thyroid nodule     PAST SURGICAL HISTORY:  Past Surgical History:  Procedure Laterality Date  . ABDOMINAL HYSTERECTOMY    . COLONOSCOPY WITH PROPOFOL N/A 07/02/2017   Procedure: COLONOSCOPY WITH PROPOFOL;  Surgeon: Lucilla Lame, MD;  Location: [facility];  Service: Endoscopy;  Laterality: N/A;  . ORIF ANKLE FRACTURE Left 08/08/2017   Procedure: OPEN REDUCTION INTERNAL FIXATION (ORIF) ANKLE FRACTURE;  Surgeon: Dereck Leep, MD;  Location: [facility];  Service: Orthopedics;  Laterality: Left;  . POLYPECTOMY N/A 07/02/2017   Procedure: POLYPECTOMY;  Surgeon: Lucilla Lame, MD;  Location: [facility];  Service: Endoscopy;  Laterality: N/A;  . WRIST FRACTURE SURGERY  08/2009   badly broken    HEMATOLOGY/ONCOLOGY HISTORY:    Squamous cell carcinoma of head and neck ([place])   07/01/2017 Initial Diagnosis    Squamous cell carcinoma of head and neck ([place])    04/02/2018 -  Chemotherapy    The patient had nivolumab (OPDIVO) 240 mg in sodium chloride 0.9 % 100 mL chemo infusion, 240 mg, Intravenous, Once, 5 of 6 cycles Administration: 240 mg (04/08/2018), 240 mg (04/22/2018), 240 mg (05/06/2018), 240 mg (05/20/2018), 240 mg (06/03/2018)  for chemotherapy treatment.      ALLERGIES:  is allergic to potassium; other; and tetanus antitoxin.  MEDICATIONS:  Current Outpatient Medications  Medication Sig Dispense Refill  . calcium carbonate (OSCAL) 1500 (600 Ca) MG TABS tablet Take 600 mg of elemental calcium by mouth daily with breakfast.    .  ENSURE (ENSURE) Take 237 mLs by mouth.    . fentaNYL (DURAGESIC - DOSED MCG/HR) 50 MCG/HR Place 50 mcg onto the skin every 3 (three) days.     Marland Kitchen ibuprofen (ADVIL,MOTRIN) 200 MG tablet Take 400 mg by mouth every 4 (four) hours as needed.    Marland Kitchen morphine (ROXANOL)  20 MG/ML concentrated solution Place 0.25 mLs (5 mg total) into feeding tube every 4 (four) hours as needed for moderate pain or severe pain. 30 mL 0  . NARCAN 4 MG/0.1ML LIQD nasal spray kit USE 1 SPRAY PER INTRANASAL ROUTE AS DIRECTED  0  . Nutritional Supplements (PROMOD) LIQD Give 71m daily via feeding tube.   Mix promod with 30-627mof water Flush tube with 3054mf water, then add promod and water mixture to syringe and flush with additional 110m19m water. 946 mL 12  . pravastatin (PRAVACHOL) 10 MG tablet Take 10 mg by mouth daily.    . predniSONE (DELTASONE) 50 MG tablet Take one tablet daily for 7 days 7 tablet 0  . promethazine (PHENERGAN) 25 MG tablet Take 25 mg by mouth every 6 (six) hours as needed for nausea or vomiting.     No current facility-administered medications for this visit.    Facility-Administered Medications Ordered in Other Visits  Medication Dose Route Frequency Provider Last Rate Last Dose  . heparin lock flush 100 unit/mL  250 Units Intracatheter PRN PandLeia Alf      . heparin lock flush 100 unit/mL  500 Units Intracatheter PRN PandLeia Alf      . potassium chloride 20 mEq in sodium chloride 0.9 % 500 mL injection   Intravenous Once SaunTye Savoyni H, RN      . sodium chloride 0.9 % injection 10 mL  10 mL Intracatheter PRN PandLeia Alf      . sodium chloride 0.9 % injection 10 mL  10 mL Intracatheter PRN PandLeia Alf      . sodium chloride 0.9 % injection 10 mL  10 mL Intracatheter PRN PandMa Hillockndeep, MD      . sodium chloride 0.9 % injection 10 mL  10 mL Intracatheter PRN PandLeia Alf        VITAL SIGNS: There were no vitals taken for this visit. There were no vitals filed for this visit.  Estimated body mass index is 16.72 kg/m as calculated from the following:   Height as of 06/03/18: _0  (1.676 m).   Weight as of an earlier encounter on 06/17/18: 103 lb 9.6 oz (47 kg).  LABS: CBC:    Component Value Date/Time    WBC 7.9 06/17/2018 0834   HGB 13.3 06/17/2018 0834   HCT 41.8 06/17/2018 0834   PLT 324 06/17/2018 0834   MCV 85.5 06/17/2018 0834   NEUTROABS 6.3 06/17/2018 0834   LYMPHSABS 1.1 06/17/2018 0834   MONOABS 0.4 06/17/2018 0834   EOSABS 0.1 06/17/2018 0834   BASOSABS 0.0 06/17/2018 0834   Comprehensive Metabolic Panel:    Component Value Date/Time   NA 143 06/17/2018 0834   K 4.0 06/17/2018 0834   CL 109 06/17/2018 0834   CO2 21 (L) 06/17/2018 0834   BUN 22 06/17/2018 0834   CREATININE 0.96 06/17/2018 0834   GLUCOSE 98 06/17/2018 0834   CALCIUM 9.6 06/17/2018 0834   AST 27 06/17/2018 0834   ALT 17 06/17/2018 0834   ALKPHOS 85 06/17/2018 0834   BILITOT 0.6 06/17/2018 0834   PROT 7.2 06/17/2018 0834  ALBUMIN 3.9 06/17/2018 0834    RADIOGRAPHIC STUDIES: Dg Abdomen 1 View  Result Date: 05/22/2018 CLINICAL DATA:  G-tube placement EXAM: ABDOMEN - 1 VIEW COMPARISON:  Portable exam 1319 hours compared to PET-CT 11/02/2017 FINDINGS: Contrast was injected through a gastrostomy tube. Catheter balloon is at the distal gastric antrum. Contrast opacifies the gastric lumen, duodenum and proximal jejunal loops. No definite contrast extravasation. IMPRESSION: Gastrostomy tube is at the distal gastric antrum. Electronically Signed   By: Lavonia Dana M.D.   On: 05/22/2018 13:33    PERFORMANCE STATUS (ECOG) : 3 - Symptomatic, >50% confined to bed  Review of Systems As noted above. Otherwise, a complete review of systems is negative.  Physical Exam General: ill appearing, in wheelchair HEENT: necrotic appearing neck mass, oral mucosa dry and flaky Lungs: Clear to auscultation bilaterally. Heart: Regular rate and rhythm. Abdomen: Soft, nontender, PEG noted but not visualized Musculoskeletal: No edema, cyanosis, or clubbing. Skin: multiple circular lesions to r. Shoulder, back, and chest. Concentric with slough-like center Neuro: Alert, answers some questions but speaking is  difficult   IMPRESSION: Follow-up visit today with patient and husband in the clinic.  Husband reports patient has had persistent diarrhea for the past 7 to 10 days.  Diarrhea felt likely secondary to the nivolumab and she is being started on steroids by Dr. Grayland Ormond.  Patient is also receiving IV fluids today.  Pain is reportedly stable.  Patient is giving the morphine at night and then giving her ibuprofen during the day.  Additionally, she is on transdermal fentanyl.  This regimen is reportedly working well.  No recent hallucinations.  We have again discussed oral care.  Patient has dry skin and I recommended OTC lotion.  PLAN: 1.  DNR 2.  Continue morphine elixir 5 mg every 4 6 hours as needed for pain 3.  Continue transdermal fentanyl 50 mcg every 72 hours 4.  Recommend oral care with hydrogen peroxide/water 1: 1 solution every 4 hours 5.  RTC in 1 month   Patient expressed understanding and was in agreement with this plan. She also understands that She can call clinic at any time with any questions, concerns, or complaints.    Time Total: 15 minutes  Visit consisted of counseling and education dealing with the complex and emotionally intense issues of symptom management and palliative care in the setting of serious and potentially life-threatening illness.Greater than 50%  of this time was spent counseling and coordinating care related to the above assessment and plan.  Signed by: Altha Harm, [place], NP-C, [place] (Work Cell)

## 2018-06-17 NOTE — Progress Notes (Addendum)
Nutrition Follow-up:  Patient with stage IV head and neck cancer and adenocarcinoma of colon.  Patient followed by Dr. Finnegan. G-tube placement on 6/7.  Met with patient and husband during infusion today.  Husband reports diarrhea for the last 10 days and imodium not helping.  Has not started promod yet as husband requested [facility] to send with next shipment.  Husband reports that he has cut tube feeding back to 2 cartons vs 4 due to diarrhea.  He has increase water flush to help keep her hydrated.  Giving IV fluids today in clinic.  Husband also reports issues with nausea and vomiting as well over the last 10 days.    Reports few bites of soft solid foods but most of nutrition coming from PEG feeding.    Medications: prednisone added today to help with diarrhea (immunotherapy induced)  Labs: BUN 22, creatinine 0.96  Anthropometrics:   Weight 103 lb  9.6 oz today decreased from 111 lb 9.6 oz on 10/17.     Estimated Energy Needs  Kcals: 1250-1500 calories Protein: 62-75 g Fluid: 1.5 L/d  NUTRITION DIAGNOSIS: Inadequate oral intake poor with resulting feeding tube    INTERVENTION: Discussed importance of hydration during times of diarrhea with husband. Water flush and pedialyte are options husband can use to keep patient hydrated.   Continue tube feeding of ensure plus 4 carton per day with water flush.   Recommend holding off starting promod at this time with diarrhea.   Encouraged husband to call clinic if diarrhea not improved.  Reviewed [facility] as option.     MONITORING, EVALUATION, GOAL: weight trends,  Intake, TF tolerance   NEXT VISIT: November 7 during infusion   B. , RD, LDN Registered Dietitian [phone] (pager)     

## 2018-06-17 NOTE — Progress Notes (Signed)
Patient here today for follow up regarding head and neck cancer. Patients husband reports worsening diarrhea over the past 2 weeks, Imodium is not helping.

## 2018-06-18 LAB — THYROID PANEL WITH TSH
FREE THYROXINE INDEX: 2.7 (ref 1.2–4.9)
T3 Uptake Ratio: 29 % (ref 24–39)
T4, Total: 9.3 ug/dL (ref 4.5–12.0)
TSH: 0.416 u[IU]/mL — ABNORMAL LOW (ref 0.450–4.500)

## 2018-06-20 NOTE — Progress Notes (Signed)
[facility]  Telephone:([phone] [phone]) [phone]  ID: Dana Perez OB: 04/11/1941  MR#: [id number]  ZPH#:[id number]  Patient Care Team: Glendon Axe, MD as PCP - General (Internal Medicine) [name], MD as Referring Physician (Surgery)  CHIEF COMPLAINT:  Recurrent, progressive stage IVa head and neck squamous cell carcinoma, adenocarcinoma of the colon.  INTERVAL HISTORY: Patient returns to clinic today for further evaluation and reconsideration of cycle 6 of palliative nivolumab.  Her diarrhea has only mildly improved despite high-dose steroids. She continues to have a significantly decreased performance status, but this has slowly improved.  The mass on her neck appears to be smaller and her pain is better controlled.  She continues to have difficulty eating and uses her PEG tube for nearly 100% of her nutrition.  She has no neurologic complaints.  She denies any recent fevers. She has no chest pain or shortness of breath. She has no urinary complaints.  Patient offers no further specific complaints today.  REVIEW OF SYSTEMS:   Review of Systems  Constitutional: Positive for malaise/fatigue. Negative for fever and weight loss.  HENT: Negative.  Negative for sore throat.   Respiratory: Negative.  Negative for cough, shortness of breath and stridor.   Cardiovascular: Negative.  Negative for chest pain and leg swelling.  Gastrointestinal: Positive for diarrhea, nausea and vomiting. Negative for abdominal pain and constipation.  Genitourinary: Negative.  Negative for dysuria.  Musculoskeletal: Positive for joint pain. Negative for falls.  Skin: Negative.  Negative for itching and rash.  Neurological: Positive for weakness. Negative for sensory change, focal weakness and headaches.  Psychiatric/Behavioral: Positive for depression and memory loss. Negative for hallucinations. The patient is nervous/anxious. The patient does not have insomnia.     As  per HPI. Otherwise, a complete review of systems is negative.  PAST MEDICAL HISTORY: Past Medical History:  Diagnosis Date  . Adenocarcinoma of colon ([place])   . Benign neoplasm of ascending colon   . Essential hypertension 06/13/2014  . Goals of care, counseling/discussion 07/14/2017  . HLD (hyperlipidemia) 06/13/2014  . Hypercholesteremia   . Hyperlipidemia   . Multiple thyroid nodules 01/22/2012  . Osteoporosis   . Polycythemia, secondary 12/26/2014  . Pure hypercholesterolemia 03/15/2015  . Squamous cell carcinoma of head and neck ([place])   . Thyroid nodule     PAST SURGICAL HISTORY: Past Surgical History:  Procedure Laterality Date  . ABDOMINAL HYSTERECTOMY    . COLONOSCOPY WITH PROPOFOL N/A 07/02/2017   Procedure: COLONOSCOPY WITH PROPOFOL;  Surgeon: Lucilla Lame, MD;  Location: [facility];  Service: Endoscopy;  Laterality: N/A;  . ORIF ANKLE FRACTURE Left 08/08/2017   Procedure: OPEN REDUCTION INTERNAL FIXATION (ORIF) ANKLE FRACTURE;  Surgeon: Dereck Leep, MD;  Location: [facility];  Service: Orthopedics;  Laterality: Left;  . POLYPECTOMY N/A 07/02/2017   Procedure: POLYPECTOMY;  Surgeon: Lucilla Lame, MD;  Location: [place];  Service: Endoscopy;  Laterality: N/A;  . WRIST FRACTURE SURGERY  08/2009   badly broken    FAMILY HISTORY Family History  Problem Relation Age of Onset  . Heart disease Mother   . Diabetes Father        ADVANCED DIRECTIVES:    HEALTH MAINTENANCE: Social History   Tobacco Use  . Smoking status: Former Smoker    Packs/day: 0.25    Types: Cigarettes    Last attempt to quit: 06/17/2017    Years since quitting: 1.0  . Smokeless tobacco: Never Used  Substance Use Topics  .  Alcohol use: No  . Drug use: No     Allergies  Allergen Reactions  . Potassium Nausea Only and Nausea And Vomiting  . Other Other (See Comments) and Rash    TDAP tdap TDAP  . Tetanus Antitoxin Rash    Current Outpatient Medications    Medication Sig Dispense Refill  . calcium carbonate (OSCAL) 1500 (600 Ca) MG TABS tablet Take 600 mg of elemental calcium by mouth daily with breakfast.    . ENSURE (ENSURE) Take 237 mLs by mouth.    . fentaNYL (DURAGESIC - DOSED MCG/HR) 50 MCG/HR Place 50 mcg onto the skin every 3 (three) days.     Marland Kitchen ibuprofen (ADVIL,MOTRIN) 200 MG tablet Take 400 mg by mouth every 4 (four) hours as needed.    Marland Kitchen morphine (ROXANOL) 20 MG/ML concentrated solution Place 0.25 mLs (5 mg total) into feeding tube every 4 (four) hours as needed for moderate pain or severe pain. 30 mL 0  . NARCAN 4 MG/0.1ML LIQD nasal spray kit USE 1 SPRAY PER INTRANASAL ROUTE AS DIRECTED  0  . Nutritional Supplements (PROMOD) LIQD Give 16m daily via feeding tube.   Mix promod with 30-652mof water Flush tube with 3059mf water, then add promod and water mixture to syringe and flush with additional 33m16m water. 946 mL 12  . pravastatin (PRAVACHOL) 10 MG tablet Take 10 mg by mouth daily.    . predniSONE (DELTASONE) 20 MG tablet Take 1 tablet (20 mg total) by mouth daily with breakfast. Take with a 50 mg tablet for a total dose of 70 mg daily 4 tablet 0  . predniSONE (DELTASONE) 50 MG tablet Take one tablet daily for 7 days 7 tablet 0  . promethazine (PHENERGAN) 25 MG tablet Take 25 mg by mouth every 6 (six) hours as needed for nausea or vomiting.    . diphenoxylate-atropine (LOMOTIL) 2.5-0.025 MG tablet Take 1 tablet by mouth 4 (four) times daily as needed for diarrhea or loose stools. 30 tablet 0  . loperamide (IMODIUM A-D) 2 MG tablet Take 1 tablet (2 mg total) by mouth 4 (four) times daily as needed for diarrhea or loose stools. 30 tablet 2   No current facility-administered medications for this visit.    Facility-Administered Medications Ordered in Other Visits  Medication Dose Route Frequency Provider Last Rate Last Dose  . heparin lock flush 100 unit/mL  250 Units Intracatheter PRN PandLeia Alf      . heparin lock  flush 100 unit/mL  500 Units Intracatheter PRN PandLeia Alf      . potassium chloride 20 mEq in sodium chloride 0.9 % 500 mL injection   Intravenous Once SaunTye Savoyni H, RN      . sodium chloride 0.9 % injection 10 mL  10 mL Intracatheter PRN PandLeia Alf      . sodium chloride 0.9 % injection 10 mL  10 mL Intracatheter PRN PandMa Hillockndeep, MD      . sodium chloride 0.9 % injection 10 mL  10 mL Intracatheter PRN PandMa Hillockndeep, MD      . sodium chloride 0.9 % injection 10 mL  10 mL Intracatheter PRN PandLeia Alf      -year-old  OBJECTIVE: Vitals:   06/24/18 0941  BP: 123/79  Pulse: 72  Temp: (!) 96.8 F (36 C)     Body mass index is 17.35 kg/m.    ECOG FS:3 - Symptomatic, >50% confined to bed  General: Ill-appearing, no  acute distress.  Sitting in a wheelchair. Eyes: Pink conjunctiva, anicteric sclera. HEENT: Neck mass significantly reduced in size.   Lungs: Clear to auscultation bilaterally. Heart: Regular rate and rhythm. No rubs, murmurs, or gallops. Abdomen: Soft, nontender, nondistended. No organomegaly noted, normoactive bowel sounds. Musculoskeletal: No edema, cyanosis, or clubbing. Neuro: Alert, answering all questions appropriately. Cranial nerves grossly intact. Skin: No rashes or petechiae noted. Psych: Flat affect.  LAB RESULTS:  Lab Results  Component Value Date   NA 137 06/24/2018   K 3.6 06/24/2018   CL 104 06/24/2018   CO2 25 06/24/2018   GLUCOSE 106 (H) 06/24/2018   BUN 22 06/24/2018   CREATININE 0.72 06/24/2018   CALCIUM 8.6 (L) 06/24/2018   PROT 7.0 06/24/2018   ALBUMIN 3.9 06/24/2018   AST 24 06/24/2018   ALT 25 06/24/2018   ALKPHOS 72 06/24/2018   BILITOT 0.4 06/24/2018   GFRNONAA >60 06/24/2018   GFRAA >60 06/24/2018    Lab Results  Component Value Date   WBC 12.0 (H) 06/24/2018   NEUTROABS 10.9 (H) 06/24/2018   HGB 14.4 06/24/2018   HCT 44.2 06/24/2018   MCV 84.0 06/24/2018   PLT 176 06/24/2018      STUDIES: No results found.  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  PLAN:    1.  Recurrent, progressive stage IVa head and neck squamous cell carcinoma: Despite recent surgery at [facility] for residual disease, patient continues to have progressive malignancy.  Her performance status is significantly decreased.  Previously we discussed at length the possibility of enrolling in hospice, but patient ultimately decided she would like to try immunotherapy as one last effort for control of disease.  Patient will receive nivolumab every 2 weeks until intolerable side effects or progression of disease.  She expressed understanding that this is likely not curative.  Although patient is still having diarrhea, it has improved therefore we will proceed with cycle 6 of nivolumab today.  Return to clinic in 2 weeks for further evaluation and consideration of cycle 7.   2.  Diarrhea: Mildly improved the steroids.  Possibly related to nivolumab, although will proceed with treatment as above.  Patient was given a prescription for Lomotil today.  Will consider infectious work-up in the future if it does not resolve. 3.  Decreased performance status: Likely multifactorial.  Chronic and unchanged.  Continue home health and physical therapy. Patient has declined hospice as above.  Appreciate palliative care input. 4.  Nausea: Continue current medications as prescribed. 5.  Pain: Continue current narcotic regimen. 6.  Hallucinations: Patient does not complain of this today.  Unclear etiology, but likely medication induced.  Monitor. 7.  PEG tube: Appreciate surgical input.  Continue tube feeds as directed.  Appreciate dietary input  Patient and her husband expressed understanding that they can call or return to clinic at any time if they have any questions, concerns, or complaints.  [name], MD 06/24/18 2:42 PM

## 2018-06-21 ENCOUNTER — Telehealth: Payer: Self-pay | Admitting: *Deleted

## 2018-06-21 DIAGNOSIS — R197 Diarrhea, unspecified: Secondary | ICD-10-CM

## 2018-06-21 DIAGNOSIS — Z5189 Encounter for other specified aftercare: Secondary | ICD-10-CM

## 2018-06-21 MED ORDER — PREDNISONE 20 MG PO TABS: 20.0000 mg | ORAL_TABLET | Freq: Every day | ORAL | 0 refills | Status: DC

## 2018-06-21 NOTE — Telephone Encounter (Signed)
Information routed to [facility].

## 2018-06-21 NOTE — Telephone Encounter (Signed)
Per VO Dr Grayland Ormond, Add Prednisone 20 mg to current 50 mg for total dose of 70 mg # 4 tabs, add stool studies to labs. Husband informed of increase in Prednisone dose and will pick up new rx

## 2018-06-21 NOTE — Telephone Encounter (Signed)
Patient's husband called to report that the patient's diarrhea has not improved, her appetite is poor she is unable to keep food in her system.Please advise

## 2018-06-21 NOTE — Telephone Encounter (Signed)
Appointment offered, but husband refused stating he cannot bring her in until Thursday because of other pending appointments. The soonest she can come is Thursday and she already has an appointment Thursday there. She is continuing to have diarrhea even with the pill she was given.  Please advise if you want to make any medicine changes. Husband also reports that he has cut back her tube feeding

## 2018-06-21 NOTE — Telephone Encounter (Signed)
[facility] clinic please.

## 2018-06-23 ENCOUNTER — Other Ambulatory Visit: Payer: Self-pay | Admitting: *Deleted

## 2018-06-23 DIAGNOSIS — C760 Malignant neoplasm of head, face and neck: Secondary | ICD-10-CM

## 2018-06-24 ENCOUNTER — Other Ambulatory Visit: Payer: Self-pay | Admitting: *Deleted

## 2018-06-24 ENCOUNTER — Inpatient Hospital Stay (HOSPITAL_BASED_OUTPATIENT_CLINIC_OR_DEPARTMENT_OTHER): Payer: PPO | Admitting: Oncology

## 2018-06-24 ENCOUNTER — Other Ambulatory Visit: Payer: Self-pay

## 2018-06-24 ENCOUNTER — Inpatient Hospital Stay (HOSPITAL_BASED_OUTPATIENT_CLINIC_OR_DEPARTMENT_OTHER): Payer: PPO | Admitting: Hospice and Palliative Medicine

## 2018-06-24 ENCOUNTER — Inpatient Hospital Stay: Payer: PPO

## 2018-06-24 ENCOUNTER — Inpatient Hospital Stay: Payer: PPO | Attending: Oncology

## 2018-06-24 ENCOUNTER — Encounter: Payer: Self-pay | Admitting: Oncology

## 2018-06-24 VITALS — BP 123/79 | HR 72 | Temp 96.8°F | Wt 107.5 lb

## 2018-06-24 DIAGNOSIS — C760 Malignant neoplasm of head, face and neck: Secondary | ICD-10-CM

## 2018-06-24 DIAGNOSIS — Z933 Colostomy status: Secondary | ICD-10-CM

## 2018-06-24 DIAGNOSIS — Z515 Encounter for palliative care: Secondary | ICD-10-CM | POA: Diagnosis not present

## 2018-06-24 DIAGNOSIS — R197 Diarrhea, unspecified: Secondary | ICD-10-CM | POA: Diagnosis not present

## 2018-06-24 DIAGNOSIS — R443 Hallucinations, unspecified: Secondary | ICD-10-CM

## 2018-06-24 DIAGNOSIS — C189 Malignant neoplasm of colon, unspecified: Secondary | ICD-10-CM

## 2018-06-24 DIAGNOSIS — I10 Essential (primary) hypertension: Secondary | ICD-10-CM

## 2018-06-24 DIAGNOSIS — Z79899 Other long term (current) drug therapy: Secondary | ICD-10-CM | POA: Diagnosis not present

## 2018-06-24 DIAGNOSIS — R110 Nausea: Secondary | ICD-10-CM

## 2018-06-24 DIAGNOSIS — M2550 Pain in unspecified joint: Secondary | ICD-10-CM

## 2018-06-24 DIAGNOSIS — Z5112 Encounter for antineoplastic immunotherapy: Secondary | ICD-10-CM | POA: Insufficient documentation

## 2018-06-24 LAB — COMPREHENSIVE METABOLIC PANEL
ALBUMIN: 3.9 g/dL (ref 3.5–5.0)
ALK PHOS: 72 U/L (ref 38–126)
ALT: 25 U/L (ref 0–44)
ANION GAP: 8 (ref 5–15)
AST: 24 U/L (ref 15–41)
BUN: 22 mg/dL (ref 8–23)
CALCIUM: 8.6 mg/dL — AB (ref 8.9–10.3)
CHLORIDE: 104 mmol/L (ref 98–111)
CO2: 25 mmol/L (ref 22–32)
Creatinine, Ser: 0.72 mg/dL (ref 0.44–1.00)
GFR calc Af Amer: >60 mL/min (ref >60)
GFR calc non Af Amer: >60 mL/min (ref >60)
GLUCOSE: 106 mg/dL — AB (ref 70–99)
Potassium: 3.6 mmol/L (ref 3.5–5.1)
SODIUM: 137 mmol/L (ref 135–145)
Total Bilirubin: 0.4 mg/dL (ref 0.3–1.2)
Total Protein: 7 g/dL (ref 6.5–8.1)

## 2018-06-24 LAB — CBC WITH DIFFERENTIAL/PLATELET
ABS IMMATURE GRANULOCYTES: 0.04 10*3/uL (ref 0.00–0.07)
Basophils Absolute: 0 10*3/uL (ref 0.0–0.1)
Basophils Relative: 0 %
EOS ABS: 0 10*3/uL (ref 0.0–0.5)
Eosinophils Relative: 0 %
HEMATOCRIT: 44.2 % (ref 36.0–46.0)
HEMOGLOBIN: 14.4 g/dL (ref 12.0–15.0)
IMMATURE GRANULOCYTES: 0 %
LYMPHS ABS: 0.9 10*3/uL (ref 0.7–4.0)
LYMPHS PCT: 7 %
MCH: 27.4 pg (ref 26.0–34.0)
MCHC: 32.6 g/dL (ref 30.0–36.0)
MCV: 84 fL (ref 80.0–100.0)
MONOS PCT: 1 %
Monocytes Absolute: 0.2 10*3/uL (ref 0.1–1.0)
NEUTROS PCT: 92 %
Neutro Abs: 10.9 10*3/uL — ABNORMAL HIGH (ref 1.7–7.7)
Platelets: 176 10*3/uL (ref 150–400)
RBC: 5.26 MIL/uL — ABNORMAL HIGH (ref 3.87–5.11)
RDW: 17.2 % — AB (ref 11.5–15.5)
WBC: 12 10*3/uL — ABNORMAL HIGH (ref 4.0–10.5)
nRBC: 0 % (ref 0.0–0.2)

## 2018-06-24 LAB — MAGNESIUM: Magnesium: 2.2 mg/dL (ref 1.7–2.4)

## 2018-06-24 MED ORDER — SODIUM CHLORIDE 0.9 % IV SOLN
Freq: Once | INTRAVENOUS | Status: AC
  Administered 2018-06-24: 11:00:00 via INTRAVENOUS
  Filled 2018-06-24: qty 250

## 2018-06-24 MED ORDER — LOPERAMIDE HCL 2 MG PO TABS: 2.0000 mg | ORAL_TABLET | Freq: Four times a day (QID) | ORAL | 2 refills | Status: DC | PRN

## 2018-06-24 MED ORDER — SODIUM CHLORIDE 0.9 % IV SOLN
240.0000 mg | Freq: Once | INTRAVENOUS | Status: AC
  Administered 2018-06-24: 240 mg via INTRAVENOUS
  Filled 2018-06-24: qty 24

## 2018-06-24 MED ORDER — DIPHENOXYLATE-ATROPINE 2.5-0.025 MG PO TABS: 1.0000 | ORAL_TABLET | Freq: Four times a day (QID) | ORAL | 0 refills | Status: DC | PRN

## 2018-06-24 NOTE — Progress Notes (Signed)
Patient here for follow up. Pt continues to have diarrhea. Per husband "not as bad as it was."

## 2018-06-24 NOTE — Progress Notes (Signed)
[facility]  [phone] [phone]   Name: Dana Perez Date: 06/24/2018 MRN: [id number]  DOB: 03-Jun-1941  Patient Care Team: Glendon Axe, MD as PCP - General (Internal Medicine) [name], MD as Referring Physician (Surgery)    REASON FOR CONSULTATION: Palliative Care consult requested for this 77 y.o. 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  PMH also includes adenocarcinoma of the colon not currently on treatment, malnutrition status post PEG insertion, she had recent bilateral ankle fractures causing a marked reduction in her performance status, chronic pain on transdermal fentanyl, hypertension, hyperlipidemia, and CKD stage III.  Patient has had disease progression and general decline despite ongoing treatment of her head & neck cancer.  Hospice had been discussed but patient/husband opted to continue treatment with nivolumab.  Palliative care is now asked to follow and provide her with support.   SOCIAL HISTORY:  Patient is married.  She lives at home with her husband.  She has no children.  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  ADVANCE DIRECTIVES:  Patient does not currently have advance directives.  She would want her husband to be her decision-maker.  Patient and husband have talked about completing a living will but have not currently done so.  CODE STATUS: DNR  PAST MEDICAL HISTORY: Past Medical History:  Diagnosis Date  . Adenocarcinoma of colon ([place])   . Benign neoplasm of ascending colon   . Essential hypertension 06/13/2014  . Goals of care, counseling/discussion 07/14/2017  . HLD (hyperlipidemia) 06/13/2014  . Hypercholesteremia   . Hyperlipidemia     . Multiple thyroid nodules 01/22/2012  . Osteoporosis   . Polycythemia, secondary 12/26/2014  . Pure hypercholesterolemia 03/15/2015  . Squamous cell carcinoma of head and neck ([place])   . Thyroid nodule     PAST SURGICAL HISTORY:  Past Surgical History:  Procedure Laterality Date  . ABDOMINAL HYSTERECTOMY    . COLONOSCOPY WITH PROPOFOL N/A 07/02/2017   Procedure: COLONOSCOPY WITH PROPOFOL;  Surgeon: Lucilla Lame, MD;  Location: [facility];  Service: Endoscopy;  Laterality: N/A;  . ORIF ANKLE FRACTURE Left 08/08/2017   Procedure: OPEN REDUCTION INTERNAL FIXATION (ORIF) ANKLE FRACTURE;  Surgeon: Dereck Leep, MD;  Location: [facility];  Service: Orthopedics;  Laterality: Left;  . POLYPECTOMY N/A 07/02/2017   Procedure: POLYPECTOMY;  Surgeon: Lucilla Lame, MD;  Location: [facility];  Service: Endoscopy;  Laterality: N/A;  . WRIST FRACTURE SURGERY  08/2009   badly broken    HEMATOLOGY/ONCOLOGY HISTORY:    Squamous cell carcinoma of head and neck ([place])   07/01/2017 Initial Diagnosis    Squamous cell carcinoma of head and neck ([place])    04/02/2018 -  Chemotherapy    The patient had nivolumab (OPDIVO) 240 mg in sodium chloride 0.9 % 100 mL chemo infusion, 240 mg, Intravenous, Once, 6 of 6 cycles Administration: 240 mg (04/08/2018), 240 mg (04/22/2018), 240 mg (05/06/2018), 240 mg (05/20/2018), 240 mg (06/03/2018), 240 mg (06/24/2018)  for chemotherapy treatment.      ALLERGIES:  is allergic to potassium; other; and tetanus antitoxin.  MEDICATIONS:  Current Outpatient Medications  Medication Sig Dispense Refill  . calcium carbonate (OSCAL) 1500 (600 Ca) MG TABS tablet Take 600 mg of elemental calcium by mouth daily with  breakfast.    . diphenoxylate-atropine (LOMOTIL) 2.5-0.025 MG tablet Take 1 tablet by mouth 4 (four) times daily as needed for diarrhea or loose stools. 30 tablet 0  . ENSURE (ENSURE) Take 237 mLs by mouth.    . fentaNYL (DURAGESIC - DOSED MCG/HR) 50 MCG/HR  Place 50 mcg onto the skin every 3 (three) days.     Marland Kitchen ibuprofen (ADVIL,MOTRIN) 200 MG tablet Take 400 mg by mouth every 4 (four) hours as needed.    . loperamide (IMODIUM A-D) 2 MG tablet Take 1 tablet (2 mg total) by mouth 4 (four) times daily as needed for diarrhea or loose stools. 30 tablet 2  . morphine (ROXANOL) 20 MG/ML concentrated solution Place 0.25 mLs (5 mg total) into feeding tube every 4 (four) hours as needed for moderate pain or severe pain. 30 mL 0  . NARCAN 4 MG/0.1ML LIQD nasal spray kit USE 1 SPRAY PER INTRANASAL ROUTE AS DIRECTED  0  . Nutritional Supplements (PROMOD) LIQD Give 72m daily via feeding tube.   Mix promod with 30-619mof water Flush tube with 301mf water, then add promod and water mixture to syringe and flush with additional 28m85m water. 946 mL 12  . pravastatin (PRAVACHOL) 10 MG tablet Take 10 mg by mouth daily.    . predniSONE (DELTASONE) 20 MG tablet Take 1 tablet (20 mg total) by mouth daily with breakfast. Take with a 50 mg tablet for a total dose of 70 mg daily 4 tablet 0  . predniSONE (DELTASONE) 50 MG tablet Take one tablet daily for 7 days 7 tablet 0  . promethazine (PHENERGAN) 25 MG tablet Take 25 mg by mouth every 6 (six) hours as needed for nausea or vomiting.     No current facility-administered medications for this visit.    Facility-Administered Medications Ordered in Other Visits  Medication Dose Route Frequency Provider Last Rate Last Dose  . heparin lock flush 100 unit/mL  250 Units Intracatheter PRN PandLeia Alf      . heparin lock flush 100 unit/mL  500 Units Intracatheter PRN PandLeia Alf      . potassium chloride 20 mEq in sodium chloride 0.9 % 500 mL injection   Intravenous Once SaunTye Savoyni H, RN      . sodium chloride 0.9 % injection 10 mL  10 mL Intracatheter PRN PandLeia Alf      . sodium chloride 0.9 % injection 10 mL  10 mL Intracatheter PRN PandLeia Alf      . sodium chloride 0.9 % injection 10 mL   10 mL Intracatheter PRN PandMa Hillockndeep, MD      . sodium chloride 0.9 % injection 10 mL  10 mL Intracatheter PRN PandLeia Alf        VITAL SIGNS: There were no vitals taken for this visit. There were no vitals filed for this visit.  Estimated body mass index is 17.35 kg/m as calculated from the following:   Height as of 06/03/18: 5' 6" (1.676 m).   Weight as of an earlier encounter on 06/24/18: 107 lb 8 oz (48.8 kg).  LABS: CBC:    Component Value Date/Time   WBC 12.0 (H) 06/24/2018 0913   HGB 14.4 06/24/2018 0913   HCT 44.2 06/24/2018 0913   PLT 176 06/24/2018 0913   MCV 84.0 06/24/2018 0913   NEUTROABS 10.9 (H) 06/24/2018 0913   LYMPHSABS 0.9 06/24/2018 0913   MONOABS 0.2 06/24/2018 0913   EOSABS 0.0 06/24/2018  0913   BASOSABS 0.0 06/24/2018 0913   Comprehensive Metabolic Panel:    Component Value Date/Time   NA 137 06/24/2018 0913   K 3.6 06/24/2018 0913   CL 104 06/24/2018 0913   CO2 25 06/24/2018 0913   BUN 22 06/24/2018 0913   CREATININE 0.72 06/24/2018 0913   GLUCOSE 106 (H) 06/24/2018 0913   CALCIUM 8.6 (L) 06/24/2018 0913   AST 24 06/24/2018 0913   ALT 25 06/24/2018 0913   ALKPHOS 72 06/24/2018 0913   BILITOT 0.4 06/24/2018 0913   PROT 7.0 06/24/2018 0913   ALBUMIN 3.9 06/24/2018 0913    RADIOGRAPHIC STUDIES: No results found.  PERFORMANCE STATUS (ECOG) : 3 - Symptomatic, >50% confined to bed  Review of Systems As noted above. Otherwise, a complete review of systems is negative.  Physical Exam General: ill appearing, in wheelchair HEENT: necrotic appearing neck mass, oral mucosa dry and flaky Lungs: Clear to auscultation bilaterally. Heart: Regular rate and rhythm. Abdomen: Soft, nontender, PEG noted but not visualized Musculoskeletal: No edema, cyanosis, or clubbing. Skin: multiple circular lesions to r. Shoulder, back, and chest. Concentric with slough-like center Neuro: Alert, answers some questions   IMPRESSION: Follow-up visit  today with patient and husband in the clinic.  Husband reports the diarrhea has persisted but improved over the past few days.  She completed course of steroids.  Diarrhea likely secondary to chemotherapy although other causes cannot be excluded.  Stool cultures are being checked.  Immunotherapy is being resumed today  Pain is reportedly well controlled.  PLAN: 1.  DNR 2.  Continue morphine elixir 5 mg every 4 6 hours as needed for pain 3.  Continue transdermal fentanyl 50 mcg every 72 hours 4.  Stool cultures 5.  RTC in 1 month   Patient expressed understanding and was in agreement with this plan. She also understands that She can call clinic at any time with any questions, concerns, or complaints.    Time Total: 15 minutes  Visit consisted of counseling and education dealing with the complex and emotionally intense issues of symptom management and palliative care in the setting of serious and potentially life-threatening illness.Greater than 50%  of this time was spent counseling and coordinating care related to the above assessment and plan.  Signed by: Altha Harm, [place], NP-C, [place] (Work Cell)

## 2018-06-24 NOTE — Progress Notes (Signed)
Nutrition Follow-up:   Patient with stage IV head and neck cancer and adenocarcinoma of colon. Patient followed by Dr. Grayland Ormond. G-tube placed 6/7  Met with husband and patient during infusion. Diarrhea continues per husband and is getting in about 2 cartons of ensure plus per day.  Reports diarrhea is better but also cutting back on food.    Medications: imodium, predinsone has stopped  Labs: reviewed  Anthropometrics:   Weight per husband report on scales at home 107 lb this am.     NUTRITION DIAGNOSIS: Inadequate oral intake poor with resulting feeding tube   INTERVENTION:  Stressed to husband importance of continuing tube feeding of 4 cartons per day to give patient adequate nutrition.  Discussed several different options 1) continuing bolus tube feeding but giving less at each feeding with more frequent feedings, 2) gravity feeding, 3) continuous feeding to help with diarrhea.  Husband wants to try 1st option.  MD adding lomotil to immodium regimen and will discuss with patient.     MONITORING, EVALUATION, GOAL: weight trends, intake   NEXT VISIT: phone call on 11/25 as RD offsite on 11/21 next infusion  Kyrell Ruacho B. Zenia Resides, [place], [place] Registered Dietitian [phone] (pager)

## 2018-06-25 ENCOUNTER — Other Ambulatory Visit: Payer: Self-pay

## 2018-06-25 ENCOUNTER — Other Ambulatory Visit: Payer: Self-pay | Admitting: *Deleted

## 2018-06-25 DIAGNOSIS — R197 Diarrhea, unspecified: Secondary | ICD-10-CM

## 2018-06-25 DIAGNOSIS — Z5112 Encounter for antineoplastic immunotherapy: Secondary | ICD-10-CM | POA: Diagnosis not present

## 2018-06-25 DIAGNOSIS — Z5189 Encounter for other specified aftercare: Secondary | ICD-10-CM

## 2018-06-25 DIAGNOSIS — C760 Malignant neoplasm of head, face and neck: Secondary | ICD-10-CM | POA: Diagnosis not present

## 2018-06-25 LAB — GASTROINTESTINAL PANEL BY PCR, STOOL (REPLACES STOOL CULTURE)
Adenovirus F40/41: NOT DETECTED
Astrovirus: NOT DETECTED
CYCLOSPORA CAYETANENSIS: NOT DETECTED
Campylobacter species: NOT DETECTED
Cryptosporidium: NOT DETECTED
E. coli O157: NOT DETECTED
ENTAMOEBA HISTOLYTICA: NOT DETECTED
ENTEROAGGREGATIVE E COLI (EAEC): NOT DETECTED
Enterotoxigenic E coli (ETEC): NOT DETECTED
GIARDIA LAMBLIA: NOT DETECTED
NOROVIRUS GI/GII: NOT DETECTED
Plesimonas shigelloides: NOT DETECTED
Rotavirus A: NOT DETECTED
SALMONELLA SPECIES: NOT DETECTED
SAPOVIRUS (I, II, IV, AND V): NOT DETECTED
SHIGELLA/ENTEROINVASIVE E COLI (EIEC): NOT DETECTED
Shiga like toxin producing E coli (STEC): DETECTED — AB
VIBRIO CHOLERAE: NOT DETECTED
VIBRIO SPECIES: NOT DETECTED
Yersinia enterocolitica: NOT DETECTED

## 2018-06-25 LAB — C DIFFICILE QUICK SCREEN W PCR REFLEX
C DIFFICLE (CDIFF) ANTIGEN: POSITIVE — AB
C Diff toxin: NEGATIVE

## 2018-06-25 LAB — CLOSTRIDIUM DIFFICILE BY PCR, REFLEXED: CDIFFPCR: NEGATIVE

## 2018-06-25 MED ORDER — CIPROFLOXACIN HCL 500 MG PO TABS: 500.0000 mg | ORAL_TABLET | Freq: Two times a day (BID) | ORAL | 0 refills | Status: DC

## 2018-06-25 NOTE — Progress Notes (Signed)
GI panel positive for STEC E. Coli, results given to Dr. Grayland Ormond. Pt to start on Cipro 500 mg BID for 10 days. Pts husband notified and prescription sent to pharmacy.

## 2018-07-01 DIAGNOSIS — K9423 Gastrostomy malfunction: Secondary | ICD-10-CM | POA: Diagnosis not present

## 2018-07-03 NOTE — Progress Notes (Signed)
[facility]  [phone] [phone]) [phone]  ID: Dana Perez OB: 09/20/1940  MR#: [id number]  WFU#:[id number]  Patient Care Team: Glendon Axe, MD as PCP - General (Internal Medicine) [name], MD as Referring Physician (Surgery)  CHIEF COMPLAINT:  Recurrent, progressive stage IVa head and neck squamous cell carcinoma, adenocarcinoma of the colon.  INTERVAL HISTORY: Patient returns to clinic today for further evaluation and consideration of cycle 7 of palliative nivolumab.  Her diarrhea has nearly resolved. She continues to have a significantly decreased performance status, but this has slowly improved.  The mass on her neck appears to be smaller and her pain is better controlled.  She continues to have difficulty eating and uses her PEG tube for nearly 100% of her nutrition.  She has no neurologic complaints.  She denies any recent fevers. She has no chest pain or shortness of breath. She has no urinary complaints.  Patient offers no further specific complaints today.  REVIEW OF SYSTEMS:   Review of Systems  Constitutional: Positive for malaise/fatigue. Negative for fever and weight loss.  HENT: Negative.  Negative for sore throat.   Respiratory: Negative.  Negative for cough, shortness of breath and stridor.   Cardiovascular: Negative.  Negative for chest pain and leg swelling.  Gastrointestinal: Positive for diarrhea, nausea and vomiting. Negative for abdominal pain and constipation.  Genitourinary: Negative.  Negative for dysuria.  Musculoskeletal: Positive for joint pain. Negative for falls.  Skin: Negative.  Negative for itching and rash.  Neurological: Positive for weakness. Negative for sensory change, focal weakness and headaches.  Psychiatric/Behavioral: Positive for depression and memory loss. Negative for hallucinations. The patient is nervous/anxious. The patient does not have insomnia.     As per HPI. Otherwise, a complete  review of systems is negative.  PAST MEDICAL HISTORY: Past Medical History:  Diagnosis Date  . Adenocarcinoma of colon ([place])   . Benign neoplasm of ascending colon   . Essential hypertension 06/13/2014  . Goals of care, counseling/discussion 07/14/2017  . HLD (hyperlipidemia) 06/13/2014  . Hypercholesteremia   . Hyperlipidemia   . Multiple thyroid nodules 01/22/2012  . Osteoporosis   . Polycythemia, secondary 12/26/2014  . Pure hypercholesterolemia 03/15/2015  . Squamous cell carcinoma of head and neck ([place])   . Thyroid nodule     PAST SURGICAL HISTORY: Past Surgical History:  Procedure Laterality Date  . ABDOMINAL HYSTERECTOMY    . COLONOSCOPY WITH PROPOFOL N/A 07/02/2017   Procedure: COLONOSCOPY WITH PROPOFOL;  Surgeon: Lucilla Lame, MD;  Location: [facility];  Service: Endoscopy;  Laterality: N/A;  . ORIF ANKLE FRACTURE Left 08/08/2017   Procedure: OPEN REDUCTION INTERNAL FIXATION (ORIF) ANKLE FRACTURE;  Surgeon: Dereck Leep, MD;  Location: [facility];  Service: Orthopedics;  Laterality: Left;  . POLYPECTOMY N/A 07/02/2017   Procedure: POLYPECTOMY;  Surgeon: Lucilla Lame, MD;  Location: [facility];  Service: Endoscopy;  Laterality: N/A;  . WRIST FRACTURE SURGERY  08/2009   badly broken    FAMILY HISTORY Family History  Problem Relation Age of Onset  . Heart disease Mother   . Diabetes Father        ADVANCED DIRECTIVES:    HEALTH MAINTENANCE: Social History   Tobacco Use  . Smoking status: Former Smoker    Packs/day: 0.25    Types: Cigarettes    Last attempt to quit: 06/17/2017    Years since quitting: 1.0  . Smokeless tobacco: Never Used  Substance Use Topics  . Alcohol use:  No  . Drug use: No     Allergies  Allergen Reactions  . Potassium Nausea Only and Nausea And Vomiting  . Other Other (See Comments) and Rash    TDAP tdap TDAP  . Tetanus Antitoxin Rash    Current Outpatient Medications  Medication Sig Dispense Refill  .  calcium carbonate (OSCAL) 1500 (600 Ca) MG TABS tablet Take 600 mg of elemental calcium by mouth daily with breakfast.    . ENSURE (ENSURE) Take 237 mLs by mouth.    . fentaNYL (DURAGESIC - DOSED MCG/HR) 50 MCG/HR Place 50 mcg onto the skin every 3 (three) days.     Marland Kitchen ibuprofen (ADVIL,MOTRIN) 200 MG tablet Take 400 mg by mouth every 4 (four) hours as needed.    . loperamide (IMODIUM A-D) 2 MG tablet Take 1 tablet (2 mg total) by mouth 4 (four) times daily as needed for diarrhea or loose stools. 30 tablet 2  . morphine (ROXANOL) 20 MG/ML concentrated solution Place 0.25 mLs (5 mg total) into feeding tube every 4 (four) hours as needed for moderate pain or severe pain. 30 mL 0  . NARCAN 4 MG/0.1ML LIQD nasal spray kit USE 1 SPRAY PER INTRANASAL ROUTE AS DIRECTED  0  . pravastatin (PRAVACHOL) 10 MG tablet Take 10 mg by mouth daily.    . promethazine (PHENERGAN) 25 MG tablet Take 25 mg by mouth every 6 (six) hours as needed for nausea or vomiting.    . ciprofloxacin (CIPRO) 500 MG tablet Take 1 tablet (500 mg total) by mouth 2 (two) times daily. (Patient not taking: Reported on 07/08/2018) 10 tablet 0  . diphenoxylate-atropine (LOMOTIL) 2.5-0.025 MG tablet Take 1 tablet by mouth 4 (four) times daily as needed for diarrhea or loose stools. (Patient not taking: Reported on 07/08/2018) 30 tablet 0  . Nutritional Supplements (PROMOD) LIQD Give 46m daily via feeding tube.   Mix promod with 30-654mof water Flush tube with 3041mf water, then add promod and water mixture to syringe and flush with additional 57m24m water. (Patient not taking: Reported on 07/08/2018) 946 mL 12  . predniSONE (DELTASONE) 20 MG tablet Take 1 tablet (20 mg total) by mouth daily with breakfast. Take with a 50 mg tablet for a total dose of 70 mg daily (Patient not taking: Reported on 07/08/2018) 4 tablet 0  . predniSONE (DELTASONE) 50 MG tablet Take one tablet daily for 7 days (Patient not taking: Reported on 07/08/2018) 7 tablet 0     No current facility-administered medications for this visit.    Facility-Administered Medications Ordered in Other Visits  Medication Dose Route Frequency Provider Last Rate Last Dose  . heparin lock flush 100 unit/mL  250 Units Intracatheter PRN PandLeia Alf      . heparin lock flush 100 unit/mL  500 Units Intracatheter PRN PandLeia Alf      . potassium chloride 20 mEq in sodium chloride 0.9 % 500 mL injection   Intravenous Once SaunTye Savoyni H, RN      . sodium chloride 0.9 % injection 10 mL  10 mL Intracatheter PRN PandLeia Alf      . sodium chloride 0.9 % injection 10 mL  10 mL Intracatheter PRN PandLeia Alf      . sodium chloride 0.9 % injection 10 mL  10 mL Intracatheter PRN PandMa Hillockndeep, MD      . sodium chloride 0.9 % injection 10 mL  10 mL Intracatheter PRN PandLeia Alf      -  year-old  OBJECTIVE: Vitals:   07/08/18 0902  BP: 97/63  Pulse: 96  Resp: 18  Temp: 98.1 F (36.7 C)     Body mass index is 17.72 kg/m.    ECOG FS:3 - Symptomatic, >50% confined to bed  General: Ill-appearing, no acute distress.  Sitting in a wheelchair.   Eyes: Pink conjunctiva, anicteric sclera. HEENT: Neck mass significantly reduced in size. Lungs: Clear to auscultation bilaterally. Heart: Regular rate and rhythm. No rubs, murmurs, or gallops. Abdomen: Soft, nontender, nondistended. No organomegaly noted, normoactive bowel sounds. Musculoskeletal: No edema, cyanosis, or clubbing. Neuro: Alert, answering all questions appropriately. Cranial nerves grossly intact. Skin: No rashes or petechiae noted. Psych: Normal affect.  LAB RESULTS:  Lab Results  Component Value Date   NA 135 07/08/2018   K 3.7 07/08/2018   CL 99 07/08/2018   CO2 26 07/08/2018   GLUCOSE 97 07/08/2018   BUN 18 07/08/2018   CREATININE 0.82 07/08/2018   CALCIUM 9.0 07/08/2018   PROT 6.4 (L) 07/08/2018   ALBUMIN 3.3 (L) 07/08/2018   AST 26 07/08/2018   ALT 14 07/08/2018    ALKPHOS 98 07/08/2018   BILITOT 0.1 (L) 07/08/2018   GFRNONAA >60 07/08/2018   GFRAA >60 07/08/2018    Lab Results  Component Value Date   WBC 8.2 07/08/2018   NEUTROABS 6.7 07/08/2018   HGB 12.3 07/08/2018   HCT 38.6 07/08/2018   MCV 85.6 07/08/2018   PLT 311 07/08/2018     STUDIES: No results found.  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  PLAN:    1.  Recurrent, progressive stage IVa head and neck squamous cell carcinoma: Despite recent surgery at [facility] for residual disease, patient continues to have progressive malignancy.  Her performance status is significantly decreased.  Previously we discussed at length the possibility of enrolling in hospice, but patient ultimately decided she would like to try immunotherapy as one last effort for control of disease.  Patient will receive nivolumab every 2 weeks until intolerable side effects or progression of disease.  She expressed understanding that this is likely not curative.  Proceed with cycle 7 today.  Return to clinic in 2 weeks for further evaluation and consideration of cycle 8.   2.  Diarrhea: Resolved.  Continue Imodium and Lomotil as needed.   3.  Decreased performance status: Likely multifactorial.  Chronic and unchanged.  Continue home health and physical therapy. Patient has declined hospice as above.  Appreciate palliative care input. 4.  Nausea: Continue current medications as prescribed. 5.  Pain: Chronic and unchanged.  Continue current narcotic regimen. 6.  Hallucinations: Patient does not complain of this today.  Unclear etiology, but likely medication induced.  Monitor. 7.  PEG tube: Appreciate surgical input.  Continue tube feeds as directed.  Appreciate dietary input  Patient and her husband expressed understanding that they can call or return to clinic at any time if they have any questions, concerns, or complaints.  [name], MD  07/13/18 9:36 AM

## 2018-07-05 DIAGNOSIS — I10 Essential (primary) hypertension: Secondary | ICD-10-CM | POA: Diagnosis not present

## 2018-07-05 DIAGNOSIS — G893 Neoplasm related pain (acute) (chronic): Secondary | ICD-10-CM | POA: Diagnosis not present

## 2018-07-05 DIAGNOSIS — Z23 Encounter for immunization: Secondary | ICD-10-CM | POA: Diagnosis not present

## 2018-07-05 DIAGNOSIS — Z0000 Encounter for general adult medical examination without abnormal findings: Secondary | ICD-10-CM | POA: Diagnosis not present

## 2018-07-07 ENCOUNTER — Other Ambulatory Visit: Payer: Self-pay | Admitting: Oncology

## 2018-07-08 ENCOUNTER — Other Ambulatory Visit: Payer: Self-pay

## 2018-07-08 ENCOUNTER — Inpatient Hospital Stay: Payer: PPO

## 2018-07-08 ENCOUNTER — Encounter: Payer: Self-pay | Admitting: Oncology

## 2018-07-08 ENCOUNTER — Inpatient Hospital Stay (HOSPITAL_BASED_OUTPATIENT_CLINIC_OR_DEPARTMENT_OTHER): Payer: PPO | Admitting: Oncology

## 2018-07-08 VITALS — BP 97/63 | HR 96 | Temp 98.1°F | Resp 18 | Wt 109.8 lb

## 2018-07-08 DIAGNOSIS — Z79899 Other long term (current) drug therapy: Secondary | ICD-10-CM | POA: Diagnosis not present

## 2018-07-08 DIAGNOSIS — C760 Malignant neoplasm of head, face and neck: Secondary | ICD-10-CM

## 2018-07-08 DIAGNOSIS — R197 Diarrhea, unspecified: Secondary | ICD-10-CM

## 2018-07-08 DIAGNOSIS — R110 Nausea: Secondary | ICD-10-CM

## 2018-07-08 DIAGNOSIS — Z933 Colostomy status: Secondary | ICD-10-CM | POA: Diagnosis not present

## 2018-07-08 DIAGNOSIS — Z5112 Encounter for antineoplastic immunotherapy: Secondary | ICD-10-CM | POA: Diagnosis not present

## 2018-07-08 LAB — CBC WITH DIFFERENTIAL/PLATELET
ABS IMMATURE GRANULOCYTES: 0.02 10*3/uL (ref 0.00–0.07)
Basophils Absolute: 0 10*3/uL (ref 0.0–0.1)
Basophils Relative: 0 %
Eosinophils Absolute: 0.2 10*3/uL (ref 0.0–0.5)
Eosinophils Relative: 2 %
HCT: 38.6 % (ref 36.0–46.0)
HEMOGLOBIN: 12.3 g/dL (ref 12.0–15.0)
Immature Granulocytes: 0 %
LYMPHS PCT: 10 %
Lymphs Abs: 0.8 10*3/uL (ref 0.7–4.0)
MCH: 27.3 pg (ref 26.0–34.0)
MCHC: 31.9 g/dL (ref 30.0–36.0)
MCV: 85.6 fL (ref 80.0–100.0)
MONO ABS: 0.5 10*3/uL (ref 0.1–1.0)
Monocytes Relative: 6 %
NEUTROS ABS: 6.7 10*3/uL (ref 1.7–7.7)
Neutrophils Relative %: 82 %
PLATELETS: 311 10*3/uL (ref 150–400)
RBC: 4.51 MIL/uL (ref 3.87–5.11)
RDW: 15.6 % — ABNORMAL HIGH (ref 11.5–15.5)
WBC: 8.2 10*3/uL (ref 4.0–10.5)
nRBC: 0 % (ref 0.0–0.2)

## 2018-07-08 LAB — COMPREHENSIVE METABOLIC PANEL
ALBUMIN: 3.3 g/dL — AB (ref 3.5–5.0)
ALT: 14 U/L (ref 0–44)
AST: 26 U/L (ref 15–41)
Alkaline Phosphatase: 98 U/L (ref 38–126)
Anion gap: 10 (ref 5–15)
BUN: 18 mg/dL (ref 8–23)
CHLORIDE: 99 mmol/L (ref 98–111)
CO2: 26 mmol/L (ref 22–32)
Calcium: 9 mg/dL (ref 8.9–10.3)
Creatinine, Ser: 0.82 mg/dL (ref 0.44–1.00)
GFR calc Af Amer: >60 mL/min (ref >60)
GFR calc non Af Amer: >60 mL/min (ref >60)
Glucose, Bld: 97 mg/dL (ref 70–99)
POTASSIUM: 3.7 mmol/L (ref 3.5–5.1)
SODIUM: 135 mmol/L (ref 135–145)
Total Bilirubin: 0.1 mg/dL — ABNORMAL LOW (ref 0.3–1.2)
Total Protein: 6.4 g/dL — ABNORMAL LOW (ref 6.5–8.1)

## 2018-07-08 LAB — MAGNESIUM: Magnesium: 2.2 mg/dL (ref 1.7–2.4)

## 2018-07-08 MED ORDER — SODIUM CHLORIDE 0.9 % IV SOLN
240.0000 mg | Freq: Once | INTRAVENOUS | Status: AC
  Administered 2018-07-08: 240 mg via INTRAVENOUS
  Filled 2018-07-08: qty 24

## 2018-07-08 MED ORDER — SODIUM CHLORIDE 0.9 % IV SOLN
Freq: Once | INTRAVENOUS | Status: AC
  Administered 2018-07-08: 10:00:00 via INTRAVENOUS
  Filled 2018-07-08: qty 250

## 2018-07-08 NOTE — Progress Notes (Signed)
Patient here for follow up. Pt states feeling good today. Diarrhea has resolved. She has been having itching on her back, no rash or bumps present.

## 2018-07-09 LAB — THYROID PANEL WITH TSH
Free Thyroxine Index: 2.3 (ref 1.2–4.9)
T3 Uptake Ratio: 28 % (ref 24–39)
T4, Total: 8.1 ug/dL (ref 4.5–12.0)
TSH: 0.975 u[IU]/mL (ref 0.450–4.500)

## 2018-07-12 ENCOUNTER — Telehealth: Payer: Self-pay

## 2018-07-12 NOTE — Telephone Encounter (Signed)
Nutrition  Called husband for nutrition follow-up.  Husband reports patient bowel movement are more solid/formed.  May have 1 one day then next 2 bowel movements.  Husband reports that he is giving tube feeding over 5 feedings per day.  Flushes with 27ml of water before and 60 ml after.  Also gives water with medication as well.    Noted positive for ecoli in stool as well.    Intervention:  Continue ensure plus 4 cartons over 5 feeding (2 1/2 syringes) with water flush 7ml before and after feeding.  Husband also giving water with medications as well (~1668ml water/d).   Next visit: December 5 during infusion  Nicolet Griffy B. Zenia Resides, [place], [place] Registered Dietitian [phone] (pager)

## 2018-07-18 NOTE — Progress Notes (Signed)
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  INTERVAL HISTORY: Patient returns to clinic today for further evaluation and consideration of cycle 8 of palliative nivolumab.  Her diarrhea has resolved. She continues to have a significantly decreased performance status.  Her pain is much better controlled.  The mass on her neck appears to be smaller. She continues to have difficulty eating and uses her PEG tube for 100% of her nutrition.  She has no neurologic complaints.  She denies any recent fevers. She has no chest pain or shortness of breath. She has no urinary complaints.  Patient offers no further specific complaints today.  REVIEW OF SYSTEMS:   Review of Systems  Constitutional: Positive for malaise/fatigue. Negative for fever and weight loss.  HENT: Negative.  Negative for sore throat.   Respiratory: Negative.  Negative for cough, shortness of breath and stridor.   Cardiovascular: Negative.  Negative for chest pain and leg swelling.  Gastrointestinal: Positive for diarrhea, nausea and vomiting. Negative for abdominal pain and constipation.  Genitourinary: Negative.  Negative for dysuria.  Musculoskeletal: Positive for joint pain. Negative for falls.  Skin: Negative.  Negative for itching and rash.  Neurological: Positive for weakness. Negative for sensory change, focal weakness and headaches.  Psychiatric/Behavioral: Positive for depression and memory loss. Negative for hallucinations. The patient is nervous/anxious. The patient does not have insomnia.     As per HPI. Otherwise, a complete review of systems is negative.  PAST MEDICAL  HISTORY: Past Medical History:  Diagnosis Date  . Adenocarcinoma of colon ([place])   . Benign neoplasm of ascending colon   . Essential hypertension 06/13/2014  . Goals of care, counseling/discussion 07/14/2017  . HLD (hyperlipidemia) 06/13/2014  . Hypercholesteremia   . Hyperlipidemia   . Multiple thyroid nodules 01/22/2012  . Osteoporosis   . Polycythemia, secondary 12/26/2014  . Pure hypercholesterolemia 03/15/2015  . Squamous cell carcinoma of head and neck ([place])   . Thyroid nodule     PAST SURGICAL HISTORY: Past Surgical History:  Procedure Laterality Date  . ABDOMINAL HYSTERECTOMY    . COLONOSCOPY WITH PROPOFOL N/A 07/02/2017   Procedure: COLONOSCOPY WITH PROPOFOL;  Surgeon: Lucilla Lame, MD;  Location: [place];  Service: Endoscopy;  Laterality: N/A;  . ORIF ANKLE FRACTURE Left 08/08/2017   Procedure: OPEN REDUCTION INTERNAL FIXATION (ORIF) ANKLE FRACTURE;  Surgeon: Dereck Leep, MD;  Location: [facility];  Service: Orthopedics;  Laterality: Left;  . POLYPECTOMY N/A 07/02/2017   Procedure: POLYPECTOMY;  Surgeon: Lucilla Lame, MD;  Location: [place];  Service: Endoscopy;  Laterality: N/A;  . WRIST FRACTURE SURGERY  08/2009   badly broken    FAMILY HISTORY Family History  Problem Relation Age of Onset  . Heart disease Mother   . Diabetes Father        ADVANCED DIRECTIVES:    HEALTH MAINTENANCE: Social History   Tobacco Use  . Smoking status: Former Smoker    Packs/day: 0.25    Types: Cigarettes    Last attempt to quit: 06/17/2017    Years since quitting: 1.1  . Smokeless tobacco: Never Used  Substance Use Topics  . Alcohol use: No  . Drug use: No  Allergies  Allergen Reactions  . Potassium Nausea Only and Nausea And Vomiting  . Other Other (See Comments) and Rash    TDAP tdap TDAP  . Tetanus Antitoxin Rash    Current Outpatient Medications  Medication Sig Dispense Refill  . calcium carbonate (OSCAL) 1500 (600 Ca) MG TABS  tablet Take 600 mg of elemental calcium by mouth daily with breakfast.    . ENSURE (ENSURE) Take 237 mLs by mouth.    . fentaNYL (DURAGESIC - DOSED MCG/HR) 50 MCG/HR Place 50 mcg onto the skin every 3 (three) days.     Marland Kitchen ibuprofen (ADVIL,MOTRIN) 200 MG tablet Take 400 mg by mouth every 4 (four) hours as needed.    . loperamide (IMODIUM A-D) 2 MG tablet Take 1 tablet (2 mg total) by mouth 4 (four) times daily as needed for diarrhea or loose stools. 30 tablet 2  . morphine (ROXANOL) 20 MG/ML concentrated solution Place 0.25 mLs (5 mg total) into feeding tube every 4 (four) hours as needed for moderate pain or severe pain. 30 mL 0  . NARCAN 4 MG/0.1ML LIQD nasal spray kit USE 1 SPRAY PER INTRANASAL ROUTE AS DIRECTED  0  . pravastatin (PRAVACHOL) 10 MG tablet Take 10 mg by mouth daily.    . promethazine (PHENERGAN) 25 MG tablet Take 25 mg by mouth every 6 (six) hours as needed for nausea or vomiting.     No current facility-administered medications for this visit.    Facility-Administered Medications Ordered in Other Visits  Medication Dose Route Frequency Provider Last Rate Last Dose  . heparin lock flush 100 unit/mL  250 Units Intracatheter PRN Leia Alf, MD      . heparin lock flush 100 unit/mL  500 Units Intracatheter PRN Leia Alf, MD      . potassium chloride 20 mEq in sodium chloride 0.9 % 500 mL injection   Intravenous Once Tye Savoy, Doni H, RN      . sodium chloride 0.9 % injection 10 mL  10 mL Intracatheter PRN Leia Alf, MD      . sodium chloride 0.9 % injection 10 mL  10 mL Intracatheter PRN Leia Alf, MD      . sodium chloride 0.9 % injection 10 mL  10 mL Intracatheter PRN Ma Hillock, Sandeep, MD      . sodium chloride 0.9 % injection 10 mL  10 mL Intracatheter PRN Leia Alf, MD      -year-old  OBJECTIVE: Vitals:   07/22/18 0913  BP: 126/79  Pulse: 99  Resp: 18  Temp: (!) 97.2 F (36.2 C)     Body mass index is 16.96 kg/m.    ECOG FS:3 - Symptomatic,  >50% confined to bed  General: Ill-appearing, no acute distress.  Sitting in a wheelchair. Eyes: Pink conjunctiva, anicteric sclera. HEENT: Neck mass significantly reduced in size.  Difficult to evaluate oropharynx. Lungs: Clear to auscultation bilaterally. Heart: Regular rate and rhythm. No rubs, murmurs, or gallops. Abdomen: Soft, nontender, nondistended. No organomegaly noted, normoactive bowel sounds. Musculoskeletal: No edema, cyanosis, or clubbing. Neuro: Alert, answering all questions appropriately. Cranial nerves grossly intact. Skin: No rashes or petechiae noted. Psych: Flat affect.  LAB RESULTS:  Lab Results  Component Value Date   NA 137 07/22/2018   K 4.1 07/22/2018   CL 101 07/22/2018   CO2 26 07/22/2018   GLUCOSE 104 (H) 07/22/2018   BUN 22 07/22/2018   CREATININE 0.78 07/22/2018   CALCIUM 9.3 07/22/2018   PROT 7.2 07/22/2018  ALBUMIN 3.6 07/22/2018   AST 24 07/22/2018   ALT 13 07/22/2018   ALKPHOS 114 07/22/2018   BILITOT 0.4 07/22/2018   GFRNONAA >60 07/22/2018   GFRAA >60 07/22/2018    Lab Results  Component Value Date   WBC 9.7 07/22/2018   NEUTROABS 7.7 07/22/2018   HGB 13.6 07/22/2018   HCT 43.0 07/22/2018   MCV 84.5 07/22/2018   PLT 391 07/22/2018     STUDIES: No results found.  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  PLAN:    1.  Recurrent, progressive stage IVa head and neck squamous cell carcinoma: Despite surgery at [facility] for residual disease, patient continueD to have progressive malignancy.  Her performance status is significantly decreased.  Previously we discussed at length the possibility of enrolling in hospice, but patient ultimately decided she would like to try immunotherapy as one last effort for control of disease.  Patient will receive nivolumab every 2 weeks until intolerable side effects or progression of disease.  She expressed understanding that this  is likely not curative.  Proceed with cycle 8 of palliative nivolumab today.  Return to clinic in 2 weeks for further evaluation and consideration of cycle 9.   2.  Diarrhea: Resolved.  Continue Imodium and Lomotil as needed.   3.  Decreased performance status: Likely multifactorial.  Chronic and unchanged.  Continue home health and physical therapy. Patient has declined hospice as above.  Continue follow-up with palliative care as indicated. 4.  Nausea: Continue current medications as prescribed. 5.  Pain: Chronic and unchanged.  Continue current narcotic regimen. 6.  Hallucinations: Patient does not complain of this today.  Unclear etiology, but likely medication induced.  Monitor. 7.  PEG tube: Appreciate surgical input.  Continue tube feeds as directed.  Appreciate dietary input.  I spent a total of 30 minutes face-to-face with the patient of which greater than 50% of the visit was spent in counseling and coordination of care as detailed above.   Patient and her husband expressed understanding that they can call or return to clinic at any time if they have any questions, concerns, or complaints.  [name], MD 07/24/18 7:52 AM

## 2018-07-19 LAB — MISCELLANEOUS TEST

## 2018-07-22 ENCOUNTER — Inpatient Hospital Stay: Payer: PPO | Attending: Hospice and Palliative Medicine | Admitting: Hospice and Palliative Medicine

## 2018-07-22 ENCOUNTER — Inpatient Hospital Stay: Payer: PPO

## 2018-07-22 ENCOUNTER — Encounter: Payer: Self-pay | Admitting: Oncology

## 2018-07-22 ENCOUNTER — Inpatient Hospital Stay: Payer: PPO | Admitting: Oncology

## 2018-07-22 ENCOUNTER — Inpatient Hospital Stay (HOSPITAL_BASED_OUTPATIENT_CLINIC_OR_DEPARTMENT_OTHER): Payer: PPO | Admitting: Oncology

## 2018-07-22 ENCOUNTER — Other Ambulatory Visit: Payer: Self-pay

## 2018-07-22 VITALS — BP 126/79 | HR 99 | Temp 97.2°F | Resp 18 | Wt 105.1 lb

## 2018-07-22 DIAGNOSIS — Z85038 Personal history of other malignant neoplasm of large intestine: Secondary | ICD-10-CM

## 2018-07-22 DIAGNOSIS — I129 Hypertensive chronic kidney disease with stage 1 through stage 4 chronic kidney disease, or unspecified chronic kidney disease: Secondary | ICD-10-CM

## 2018-07-22 DIAGNOSIS — E46 Unspecified protein-calorie malnutrition: Secondary | ICD-10-CM

## 2018-07-22 DIAGNOSIS — C760 Malignant neoplasm of head, face and neck: Secondary | ICD-10-CM

## 2018-07-22 DIAGNOSIS — G8929 Other chronic pain: Secondary | ICD-10-CM | POA: Diagnosis not present

## 2018-07-22 DIAGNOSIS — Z79899 Other long term (current) drug therapy: Secondary | ICD-10-CM

## 2018-07-22 DIAGNOSIS — R110 Nausea: Secondary | ICD-10-CM

## 2018-07-22 DIAGNOSIS — G893 Neoplasm related pain (acute) (chronic): Secondary | ICD-10-CM

## 2018-07-22 DIAGNOSIS — Z5112 Encounter for antineoplastic immunotherapy: Secondary | ICD-10-CM | POA: Insufficient documentation

## 2018-07-22 DIAGNOSIS — Z515 Encounter for palliative care: Secondary | ICD-10-CM

## 2018-07-22 DIAGNOSIS — N183 Chronic kidney disease, stage 3 (moderate): Secondary | ICD-10-CM | POA: Diagnosis not present

## 2018-07-22 DIAGNOSIS — R443 Hallucinations, unspecified: Secondary | ICD-10-CM

## 2018-07-22 DIAGNOSIS — Z931 Gastrostomy status: Secondary | ICD-10-CM

## 2018-07-22 LAB — CBC WITH DIFFERENTIAL/PLATELET
Abs Immature Granulocytes: 0.04 10*3/uL (ref 0.00–0.07)
Basophils Absolute: 0 10*3/uL (ref 0.0–0.1)
Basophils Relative: 0 %
Eosinophils Absolute: 0.2 10*3/uL (ref 0.0–0.5)
Eosinophils Relative: 2 %
HCT: 43 % (ref 36.0–46.0)
HEMOGLOBIN: 13.6 g/dL (ref 12.0–15.0)
Immature Granulocytes: 0 %
Lymphocytes Relative: 12 %
Lymphs Abs: 1.2 10*3/uL (ref 0.7–4.0)
MCH: 26.7 pg (ref 26.0–34.0)
MCHC: 31.6 g/dL (ref 30.0–36.0)
MCV: 84.5 fL (ref 80.0–100.0)
Monocytes Absolute: 0.6 10*3/uL (ref 0.1–1.0)
Monocytes Relative: 6 %
Neutro Abs: 7.7 10*3/uL (ref 1.7–7.7)
Neutrophils Relative %: 80 %
Platelets: 391 10*3/uL (ref 150–400)
RBC: 5.09 MIL/uL (ref 3.87–5.11)
RDW: 14.6 % (ref 11.5–15.5)
WBC: 9.7 10*3/uL (ref 4.0–10.5)
nRBC: 0 % (ref 0.0–0.2)

## 2018-07-22 LAB — COMPREHENSIVE METABOLIC PANEL
ALT: 13 U/L (ref 0–44)
ANION GAP: 10 (ref 5–15)
AST: 24 U/L (ref 15–41)
Albumin: 3.6 g/dL (ref 3.5–5.0)
Alkaline Phosphatase: 114 U/L (ref 38–126)
BUN: 22 mg/dL (ref 8–23)
CO2: 26 mmol/L (ref 22–32)
Calcium: 9.3 mg/dL (ref 8.9–10.3)
Chloride: 101 mmol/L (ref 98–111)
Creatinine, Ser: 0.78 mg/dL (ref 0.44–1.00)
GFR calc Af Amer: >60 mL/min (ref >60)
GFR calc non Af Amer: >60 mL/min (ref >60)
Glucose, Bld: 104 mg/dL — ABNORMAL HIGH (ref 70–99)
Potassium: 4.1 mmol/L (ref 3.5–5.1)
Sodium: 137 mmol/L (ref 135–145)
Total Bilirubin: 0.4 mg/dL (ref 0.3–1.2)
Total Protein: 7.2 g/dL (ref 6.5–8.1)

## 2018-07-22 LAB — MAGNESIUM: Magnesium: 2.1 mg/dL (ref 1.7–2.4)

## 2018-07-22 MED ORDER — SODIUM CHLORIDE 0.9 % IV SOLN
Freq: Once | INTRAVENOUS | Status: AC
  Administered 2018-07-22: 10:00:00 via INTRAVENOUS
  Filled 2018-07-22: qty 250

## 2018-07-22 MED ORDER — SODIUM CHLORIDE 0.9 % IV SOLN
240.0000 mg | Freq: Once | INTRAVENOUS | Status: AC
  Administered 2018-07-22: 240 mg via INTRAVENOUS
  Filled 2018-07-22: qty 24

## 2018-07-22 NOTE — Progress Notes (Signed)
Patient here today for follow up and treatment consideration regarding head and neck cancer. Patients husband reports pain is well controlled, appetite improving.

## 2018-07-22 NOTE — Progress Notes (Unsigned)
   Symptom Management Consult note [facility]  [phone] [phone]  Patient Care Team: Glendon Axe, MD as PCP - General (Internal Medicine) [name], MD as Referring Physician (Surgery)   Name of the patient: Dana Perez  [id number]  Jan 27, 1941   Date of visit: 07/22/2018  Diagnosis: Squamous Cell Carcinoma of head and neck  Chief Complaint: Peg tube skin irritation  Current Treatment: S/p cycle 8 Opdivo. Last given today 07/22/18  Oncology History:    Squamous cell carcinoma of head and neck ([place])   07/01/2017 Initial Diagnosis    Squamous cell carcinoma of head and neck ([place])    04/02/2018 -  Chemotherapy    The patient had nivolumab (OPDIVO) 240 mg in sodium chloride 0.9 % 100 mL chemo infusion, 240 mg, Intravenous, Once, 8 of 12 cycles Administration: 240 mg (04/08/2018), 240 mg (04/22/2018), 240 mg (05/06/2018), 240 mg (05/20/2018), 240 mg (06/03/2018), 240 mg (06/24/2018), 240 mg (07/08/2018), 240 mg (07/22/2018)  for chemotherapy treatment.      Subjective Data:  ECOG: {CHL ONC OI:[id number]}

## 2018-07-22 NOTE — Progress Notes (Signed)
Nutrition Follow-up:  Patient with head and neck cancer stage IV and adenocarcinoma of the colon.  Patient followed by Dr. Grayland Ormond. G tube placed on 6/7 at [facility]. Patient currently receiving nivolumab.   Met with patient and husband during infusion today.  Husband reports diarrhea has resolved. Reports that typically has a bowel movement 1 daily or every other day.  Reports that he is feeding ensure plus 4 cartons over 5 feedings daily (2 1/2 syringe) at each feeding.  Flushes with 17m water before and after each feeding and also gives water with medication.    Reports tolerating feeding well without issues.  Oral intake remains the same as few bites.   Husband reports redness around peg tube site that seems to be increasing.   Medications: reviewed  Labs: reviewed  Anthropometrics:   Weight decreased to 105 lb 1.6 oz today from 109 lb 12.8 oz on 11/21.    Weight on 11/7 reported weight by husband of 107 lb 8 oz, 103 lb on 10/31, 111 lb on 10/17  Husband reports on home scales 2 days ago weighing around 108 lb.    From mid October to mid November patient has been struggling with diarrhea and husband has been cutting back tube feeding from 4 cartons to 2 during this time frame.     Estimated Energy Needs  Kcals: 1250-1500 calories/d Protein: 62-75 g Fluid: 1.5 L/d  NUTRITION DIAGNOSIS: Inadequate oral intake continues   INTERVENTION:  Concerned regarding recent weight loss. Will continue current tube feeding regimen of ensure plus 4 cartons per day at this time and monitor weight changes.  If continues to decrease would recommend increasing to 5 cartons of ensure plus daily.  Current regimen providing 1400 calories, 52 g protein and ~1680 ml free water. Asked JSonia Baller NP to see patient regarding redness around PEG tube site and provide recommendations    MONITORING, EVALUATION, GOAL: weight trends, TF tolerance   NEXT VISIT: December 19 during infusion  Alondra Sahni B. AZenia [place]ides [place]  LPoundRegistered Dietitian [phone](pager)

## 2018-07-22 NOTE — Progress Notes (Signed)
[place]  [phone] [phone]   Name: Dana Perez Date: 07/22/2018 MRN: [id number]  DOB: 08-Sep-1940  Patient Care Team: Glendon Axe, MD as PCP - General (Internal Medicine) [name], MD as Referring Physician (Surgery)    REASON FOR CONSULTATION: Palliative Care consult requested for this 77 y.o. 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  PMH also includes adenocarcinoma of the colon not currently on treatment, malnutrition status post PEG insertion, she had recent bilateral ankle fractures causing a marked reduction in her performance status, chronic pain on transdermal fentanyl, hypertension, hyperlipidemia, and CKD stage III.  Patient has had disease progression and general decline despite ongoing treatment of her head & neck cancer.  Hospice had been discussed but patient/husband opted to continue treatment with nivolumab.  Palliative care is now asked to follow and provide her with support.   SOCIAL HISTORY:  Patient is married.  She lives at home with her husband.  She has no children.  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  ADVANCE DIRECTIVES:  Patient does not currently have advance directives.  She would want her husband to be her decision-maker.  Patient and husband have talked about completing a living will but have not currently done so.  CODE STATUS: DNR  PAST MEDICAL HISTORY: Past Medical History:  Diagnosis Date  . Adenocarcinoma of colon ([place])   . Benign neoplasm of ascending colon   . Essential hypertension 06/13/2014  . Goals of care, counseling/discussion 07/14/2017  . HLD (hyperlipidemia) 06/13/2014  . Hypercholesteremia   . Hyperlipidemia     . Multiple thyroid nodules 01/22/2012  . Osteoporosis   . Polycythemia, secondary 12/26/2014  . Pure hypercholesterolemia 03/15/2015  . Squamous cell carcinoma of head and neck ([place])   . Thyroid nodule     PAST SURGICAL HISTORY:  Past Surgical History:  Procedure Laterality Date  . ABDOMINAL HYSTERECTOMY    . COLONOSCOPY WITH PROPOFOL N/A 07/02/2017   Procedure: COLONOSCOPY WITH PROPOFOL;  Surgeon: Lucilla Lame, MD;  Location: [place];  Service: Endoscopy;  Laterality: N/A;  . ORIF ANKLE FRACTURE Left 08/08/2017   Procedure: OPEN REDUCTION INTERNAL FIXATION (ORIF) ANKLE FRACTURE;  Surgeon: Dereck Leep, MD;  Location: [facility];  Service: Orthopedics;  Laterality: Left;  . POLYPECTOMY N/A 07/02/2017   Procedure: POLYPECTOMY;  Surgeon: Lucilla Lame, MD;  Location: [place];  Service: Endoscopy;  Laterality: N/A;  . WRIST FRACTURE SURGERY  08/2009   badly broken    HEMATOLOGY/ONCOLOGY HISTORY:    Squamous cell carcinoma of head and neck ([place])   07/01/2017 Initial Diagnosis    Squamous cell carcinoma of head and neck ([place])    04/02/2018 -  Chemotherapy    The patient had nivolumab (OPDIVO) 240 mg in sodium chloride 0.9 % 100 mL chemo infusion, 240 mg, Intravenous, Once, 7 of 12 cycles Administration: 240 mg (04/08/2018), 240 mg (04/22/2018), 240 mg (05/06/2018), 240 mg (05/20/2018), 240 mg (06/03/2018), 240 mg (06/24/2018), 240 mg (07/08/2018)  for chemotherapy treatment.      ALLERGIES:  is allergic to potassium; other; and tetanus antitoxin.  MEDICATIONS:  Current Outpatient Medications  Medication Sig Dispense Refill  . calcium carbonate (OSCAL) 1500 (600 Ca) MG TABS tablet Take 600 mg of elemental calcium by  mouth daily with breakfast.    . ENSURE (ENSURE) Take 237 mLs by mouth.    . fentaNYL (DURAGESIC - DOSED MCG/HR) 50 MCG/HR Place 50 mcg onto the skin every 3 (three) days.     Marland Kitchen ibuprofen (ADVIL,MOTRIN) 200 MG tablet Take 400 mg by mouth every 4 (four)  hours as needed.    . loperamide (IMODIUM A-D) 2 MG tablet Take 1 tablet (2 mg total) by mouth 4 (four) times daily as needed for diarrhea or loose stools. 30 tablet 2  . morphine (ROXANOL) 20 MG/ML concentrated solution Place 0.25 mLs (5 mg total) into feeding tube every 4 (four) hours as needed for moderate pain or severe pain. 30 mL 0  . NARCAN 4 MG/0.1ML LIQD nasal spray kit USE 1 SPRAY PER INTRANASAL ROUTE AS DIRECTED  0  . pravastatin (PRAVACHOL) 10 MG tablet Take 10 mg by mouth daily.    . promethazine (PHENERGAN) 25 MG tablet Take 25 mg by mouth every 6 (six) hours as needed for nausea or vomiting.     No current facility-administered medications for this visit.    Facility-Administered Medications Ordered in Other Visits  Medication Dose Route Frequency Provider Last Rate Last Dose  . heparin lock flush 100 unit/mL  250 Units Intracatheter PRN Leia Alf, MD      . heparin lock flush 100 unit/mL  500 Units Intracatheter PRN Leia Alf, MD      . potassium chloride 20 mEq in sodium chloride 0.9 % 500 mL injection   Intravenous Once Tye Savoy, Doni H, RN      . sodium chloride 0.9 % injection 10 mL  10 mL Intracatheter PRN Leia Alf, MD      . sodium chloride 0.9 % injection 10 mL  10 mL Intracatheter PRN Leia Alf, MD      . sodium chloride 0.9 % injection 10 mL  10 mL Intracatheter PRN Ma Hillock, Sandeep, MD      . sodium chloride 0.9 % injection 10 mL  10 mL Intracatheter PRN Leia Alf, MD        VITAL SIGNS: There were no vitals taken for this visit. There were no vitals filed for this visit.  Estimated body mass index is 16.96 kg/m as calculated from the following:   Height as of 06/03/18: _0  (1.676 m).   Weight as of an earlier encounter on 07/22/18: 105 lb 1.6 oz (47.7 kg).  LABS: CBC:    Component Value Date/Time   WBC 9.7 07/22/2018 0841   HGB 13.6 07/22/2018 0841   HCT 43.0 07/22/2018 0841   PLT 391 07/22/2018 0841   MCV 84.5 07/22/2018  0841   NEUTROABS 7.7 07/22/2018 0841   LYMPHSABS 1.2 07/22/2018 0841   MONOABS 0.6 07/22/2018 0841   EOSABS 0.2 07/22/2018 0841   BASOSABS 0.0 07/22/2018 0841   Comprehensive Metabolic Panel:    Component Value Date/Time   NA 137 07/22/2018 0841   K 4.1 07/22/2018 0841   CL 101 07/22/2018 0841   CO2 26 07/22/2018 0841   BUN 22 07/22/2018 0841   CREATININE 0.78 07/22/2018 0841   GLUCOSE 104 (H) 07/22/2018 0841   CALCIUM 9.3 07/22/2018 0841   AST 24 07/22/2018 0841   ALT 13 07/22/2018 0841   ALKPHOS 114 07/22/2018 0841   BILITOT 0.4 07/22/2018 0841   PROT 7.2 07/22/2018 0841   ALBUMIN 3.6 07/22/2018 0841    RADIOGRAPHIC STUDIES: No results found.  PERFORMANCE STATUS (ECOG) : 3 - Symptomatic, >50% confined  to bed  Review of Systems As noted above. Otherwise, a complete review of systems is negative.  Physical Exam General: ill appearing, in wheelchair HEENT: necrotic appearing neck mass, oral mucosa dry and flaky Lungs: Clear to auscultation bilaterally. Heart: Regular rate and rhythm. Abdomen: Soft, nontender, PEG noted but not visualized Musculoskeletal: No edema, cyanosis, or clubbing. Skin: multiple circular lesions to r. Shoulder, back, and chest seem to be improving Neuro: Alert, answers some questions   IMPRESSION: Follow-up visit today with patient and husband in the clinic.    Husband reports the patient is doing well.  Pain is being managed with transdermal fentanyl and occasional use of morphine elixir.  Husband says he is only needing the morphine about 5 times per week.  Patient is currently asymptomatic and denies pain, shortness of breath, nausea, diarrhea, or other distressing symptoms.  Patient reportedly has occasional itching to her back.  She denies pruritus currently.  She does not appear to have any redness or rashes evident during today's visit.  Husband says this occurs when she is sitting on her couch for prolonged period.  It seems possible  that the itching might be sweat related.  We talked about transferring positions and use of a barrier cream if necessary.  I note that patient's weight has declined slightly.  She is down about 4 pounds over the past month to current weight of 105.  Oral intake is reportedly stable and patient continues to receive 4 cans of nutritional supplement via PEG daily.  She is followed by our dietitian.  Follow weight trends.  PLAN: 1.  Continue morphine elixir 5 mg every 4-6 hours as needed for pain 2.  Continue transdermal fentanyl 50 mcg every 72 hours 3.  RTC in 1 month   Patient expressed understanding and was in agreement with this plan. She also understands that She can call clinic at any time with any questions, concerns, or complaints.    Time Total: 15 minutes  Visit consisted of counseling and education dealing with the complex and emotionally intense issues of symptom management and palliative care in the setting of serious and potentially life-threatening illness.Greater than 50%  of this time was spent counseling and coordinating care related to the above assessment and plan.  Signed by: Altha Harm, [place], NP-C, [place] (Work Cell)

## 2018-07-23 LAB — THYROID PANEL WITH TSH
Free Thyroxine Index: 2 (ref 1.2–4.9)
T3 Uptake Ratio: 25 % (ref 24–39)
T4, Total: 8 ug/dL (ref 4.5–12.0)
TSH: 1.5 u[IU]/mL (ref 0.450–4.500)

## 2018-07-30 ENCOUNTER — Telehealth: Payer: Self-pay | Admitting: *Deleted

## 2018-07-30 MED ORDER — METHYLPREDNISOLONE 4 MG PO TBPK: ORAL_TABLET | Freq: Every morning | ORAL | 0 refills | Status: AC

## 2018-07-30 NOTE — Telephone Encounter (Signed)
Husband called to report that patient again has diarrhea and is asking for more Prednisone to treat it. Please advise

## 2018-07-30 NOTE — Telephone Encounter (Signed)
Medrol dose pack please

## 2018-08-01 NOTE — Progress Notes (Signed)
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  INTERVAL HISTORY: Patient returns to clinic today for further evaluation and consideration of 9 of palliative nivolumab.  She had increased diarrhea this past week, but resolved with a Medrol Dosepak.  She has erythema and irritation around her PEG tube site.  She continues have a decreased performance status.  Her pain is much better controlled.  The mass on her neck appears to be smaller. She continues to have difficulty eating and uses her PEG tube for 100% of her nutrition.  She has no neurologic complaints.  She denies any recent fevers. She has no chest pain or shortness of breath. She has no urinary complaints.  Patient offers no further specific complaints today.  REVIEW OF SYSTEMS:   Review of Systems  Constitutional: Positive for malaise/fatigue. Negative for fever and weight loss.  HENT: Negative.  Negative for sore throat.   Respiratory: Negative.  Negative for cough, shortness of breath and stridor.   Cardiovascular: Negative.  Negative for chest pain and leg swelling.  Gastrointestinal: Positive for diarrhea. Negative for abdominal pain, constipation, nausea and vomiting.  Genitourinary: Negative.  Negative for dysuria.  Musculoskeletal: Negative.  Negative for falls and joint pain.  Skin: Negative.  Negative for itching and rash.  Neurological: Positive for weakness. Negative for sensory change, focal weakness and headaches.  Psychiatric/Behavioral: Positive for depression and memory loss. Negative for hallucinations. The patient is not nervous/anxious and does not have insomnia.      As per HPI. Otherwise, a complete review of systems is negative.  PAST MEDICAL HISTORY: Past Medical History:  Diagnosis Date  . Adenocarcinoma of colon ([place])   . Benign neoplasm of ascending colon   . Essential hypertension 06/13/2014  . Goals of care, counseling/discussion 07/14/2017  . HLD (hyperlipidemia) 06/13/2014  . Hypercholesteremia   . Hyperlipidemia   . Multiple thyroid nodules 01/22/2012  . Osteoporosis   . Polycythemia, secondary 12/26/2014  . Pure hypercholesterolemia 03/15/2015  . Squamous cell carcinoma of head and neck ([place])   . Thyroid nodule     PAST SURGICAL HISTORY: Past Surgical History:  Procedure Laterality Date  . ABDOMINAL HYSTERECTOMY    . COLONOSCOPY WITH PROPOFOL N/A 07/02/2017   Procedure: COLONOSCOPY WITH PROPOFOL;  Surgeon: Lucilla Lame, MD;  Location: [place];  Service: Endoscopy;  Laterality: N/A;  . ORIF ANKLE FRACTURE Left 08/08/2017   Procedure: OPEN REDUCTION INTERNAL FIXATION (ORIF) ANKLE FRACTURE;  Surgeon: Dereck Leep, MD;  Location: [facility];  Service: Orthopedics;  Laterality: Left;  . POLYPECTOMY N/A 07/02/2017   Procedure: POLYPECTOMY;  Surgeon: Lucilla Lame, MD;  Location: [place];  Service: Endoscopy;  Laterality: N/A;  . WRIST FRACTURE SURGERY  08/2009   badly broken    FAMILY HISTORY Family History  Problem Relation Age of Onset  . Heart disease Mother   . Diabetes Father        ADVANCED DIRECTIVES:    HEALTH MAINTENANCE: Social History   Tobacco Use  . Smoking status: Former Smoker    Packs/day: 0.25    Types: Cigarettes    Last attempt to quit: 06/17/2017    Years since quitting: 1.1  . Smokeless  tobacco: Never Used  Substance Use Topics  . Alcohol use: No  . Drug use: No     Allergies  Allergen Reactions  . Potassium Nausea Only and Nausea And Vomiting  . Other Other (See Comments) and Rash    TDAP tdap TDAP  . Tetanus Antitoxin Rash    Current Outpatient Medications   Medication Sig Dispense Refill  . calcium carbonate (OSCAL) 1500 (600 Ca) MG TABS tablet Take 600 mg of elemental calcium by mouth daily with breakfast.    . ENSURE (ENSURE) Take 237 mLs by mouth.    . fentaNYL (DURAGESIC - DOSED MCG/HR) 50 MCG/HR Place 50 mcg onto the skin every 3 (three) days.     Marland Kitchen ibuprofen (ADVIL,MOTRIN) 200 MG tablet Take 400 mg by mouth every 4 (four) hours as needed.    . loperamide (IMODIUM A-D) 2 MG tablet Take 1 tablet (2 mg total) by mouth 4 (four) times daily as needed for diarrhea or loose stools. 30 tablet 2  . NARCAN 4 MG/0.1ML LIQD nasal spray kit USE 1 SPRAY PER INTRANASAL ROUTE AS DIRECTED  0  . pravastatin (PRAVACHOL) 10 MG tablet Take 10 mg by mouth daily.    . promethazine (PHENERGAN) 25 MG tablet Take 25 mg by mouth every 6 (six) hours as needed for nausea or vomiting.    . cephALEXin (KEFLEX) 500 MG capsule Take 1 capsule (500 mg total) by mouth 3 (three) times daily. 21 capsule 0  . morphine (ROXANOL) 20 MG/ML concentrated solution Place 0.25 mLs (5 mg total) into feeding tube every 4 (four) hours as needed for moderate pain or severe pain. 30 mL 0   No current facility-administered medications for this visit.    Facility-Administered Medications Ordered in Other Visits  Medication Dose Route Frequency Provider Last Rate Last Dose  . heparin lock flush 100 unit/mL  250 Units Intracatheter PRN Leia Alf, MD      . heparin lock flush 100 unit/mL  500 Units Intracatheter PRN Leia Alf, MD      . potassium chloride 20 mEq in sodium chloride 0.9 % 500 mL injection   Intravenous Once Tye Savoy, Doni H, RN      . sodium chloride 0.9 % injection 10 mL  10 mL Intracatheter PRN Leia Alf, MD      . sodium chloride 0.9 % injection 10 mL  10 mL Intracatheter PRN Leia Alf, MD      . sodium chloride 0.9 % injection 10 mL  10 mL Intracatheter PRN Ma Hillock, Sandeep, MD      . sodium chloride 0.9 % injection 10 mL  10 mL Intracatheter PRN Leia Alf, MD      -year-old  OBJECTIVE: Vitals:   08/05/18 0902  BP: 123/70  Pulse: 82  Resp: 18  Temp: 97.9 F (36.6 C)     Body mass index is 17.03 kg/m.    ECOG FS:3 - Symptomatic, >50% confined to bed  General: Ill-appearing, no acute distress.  Sitting in wheelchair. Eyes: Pink conjunctiva, anicteric sclera. HEENT: Mass on neck significantly reduced in size.  Difficult to evaluate oropharynx. Lungs: Clear to auscultation bilaterally. Heart: Regular rate and rhythm. No rubs, murmurs, or gallops. Abdomen: PEG tube noted with mild erythema and drainage surrounding site. Musculoskeletal: No edema, cyanosis, or clubbing. Neuro: Alert, answering all questions appropriately. Cranial nerves grossly intact. Skin: No rashes or petechiae noted. Psych: Normal affect.  LAB RESULTS:  Lab Results  Component Value Date   NA 137 08/05/2018  K 3.8 08/05/2018   CL 102 08/05/2018   CO2 26 08/05/2018   GLUCOSE 102 (H) 08/05/2018   BUN 22 08/05/2018   CREATININE 0.83 08/05/2018   CALCIUM 9.2 08/05/2018   PROT 7.0 08/05/2018   ALBUMIN 3.8 08/05/2018   AST 23 08/05/2018   ALT 16 08/05/2018   ALKPHOS 90 08/05/2018   BILITOT 0.5 08/05/2018   GFRNONAA >60 08/05/2018   GFRAA >60 08/05/2018    Lab Results  Component Value Date   WBC 7.5 08/05/2018   NEUTROABS 5.3 08/05/2018   HGB 14.4 08/05/2018   HCT 45.7 08/05/2018   MCV 85.4 08/05/2018   PLT 319 08/05/2018     STUDIES: No results found.  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  PLAN:    1.  Recurrent, progressive stage IVa head and neck squamous cell carcinoma: Despite surgery at [facility] for residual disease, patient continues to have progressive malignancy.  Her performance status is significantly decreased.  Previously we discussed at length the possibility of enrolling in hospice, but patient ultimately decided she would like to try immunotherapy as  one last effort for control of disease.  Patient will receive nivolumab every 2 weeks until intolerable side effects or progression of disease.  She expressed understanding that this is likely not curative.  Proceed with cycle 9 of nivolumab today.  Return to clinic in 2 weeks for further evaluation and consideration of cycle 10.  Consider reimaging after cycle 12.     2.  Diarrhea: Resolved with Medrol Dosepak.  Continue Imodium and Lomotil as needed.   3.  Decreased performance status: Likely multifactorial.  Chronic and unchanged.  Continue home health and physical therapy. Patient has declined hospice as above.  Continue follow-up with palliative care as indicated. 4.  Nausea: Continue current medications as prescribed. 5.  Pain: Chronic and unchanged.  Continue current narcotic regimen. 6.  Hallucinations: Patient does not complain of this today.  Unclear etiology, but likely medication induced.  Monitor. 7.  PEG tube: Patient was given a prescription for Keflex today.  Continue follow-up with surgery as needed.  Continue tube feeds as directed.  Appreciate dietary input.  Patient and her husband expressed understanding that they can call or return to clinic at any time if they have any questions, concerns, or complaints.  [name], MD 08/07/18 7:18 AM

## 2018-08-05 ENCOUNTER — Inpatient Hospital Stay (HOSPITAL_BASED_OUTPATIENT_CLINIC_OR_DEPARTMENT_OTHER): Payer: PPO | Admitting: Oncology

## 2018-08-05 ENCOUNTER — Inpatient Hospital Stay: Payer: PPO

## 2018-08-05 ENCOUNTER — Other Ambulatory Visit: Payer: Self-pay | Admitting: *Deleted

## 2018-08-05 ENCOUNTER — Encounter: Payer: Self-pay | Admitting: Oncology

## 2018-08-05 VITALS — BP 123/70 | HR 82 | Temp 97.9°F | Resp 18 | Wt 105.5 lb

## 2018-08-05 DIAGNOSIS — C760 Malignant neoplasm of head, face and neck: Secondary | ICD-10-CM

## 2018-08-05 DIAGNOSIS — R110 Nausea: Secondary | ICD-10-CM

## 2018-08-05 DIAGNOSIS — Z931 Gastrostomy status: Secondary | ICD-10-CM

## 2018-08-05 DIAGNOSIS — Z85038 Personal history of other malignant neoplasm of large intestine: Secondary | ICD-10-CM | POA: Diagnosis not present

## 2018-08-05 DIAGNOSIS — G8929 Other chronic pain: Secondary | ICD-10-CM

## 2018-08-05 DIAGNOSIS — Z5112 Encounter for antineoplastic immunotherapy: Secondary | ICD-10-CM | POA: Diagnosis not present

## 2018-08-05 DIAGNOSIS — Z79899 Other long term (current) drug therapy: Secondary | ICD-10-CM

## 2018-08-05 DIAGNOSIS — R443 Hallucinations, unspecified: Secondary | ICD-10-CM | POA: Diagnosis not present

## 2018-08-05 LAB — COMPREHENSIVE METABOLIC PANEL
ALK PHOS: 90 U/L (ref 38–126)
ALT: 16 U/L (ref 0–44)
ANION GAP: 9 (ref 5–15)
AST: 23 U/L (ref 15–41)
Albumin: 3.8 g/dL (ref 3.5–5.0)
BUN: 22 mg/dL (ref 8–23)
CALCIUM: 9.2 mg/dL (ref 8.9–10.3)
CO2: 26 mmol/L (ref 22–32)
Chloride: 102 mmol/L (ref 98–111)
Creatinine, Ser: 0.83 mg/dL (ref 0.44–1.00)
GFR calc Af Amer: >60 mL/min (ref >60)
GFR calc non Af Amer: >60 mL/min (ref >60)
Glucose, Bld: 102 mg/dL — ABNORMAL HIGH (ref 70–99)
POTASSIUM: 3.8 mmol/L (ref 3.5–5.1)
Sodium: 137 mmol/L (ref 135–145)
TOTAL PROTEIN: 7 g/dL (ref 6.5–8.1)
Total Bilirubin: 0.5 mg/dL (ref 0.3–1.2)

## 2018-08-05 LAB — CBC WITH DIFFERENTIAL/PLATELET
Abs Immature Granulocytes: 0.02 10*3/uL (ref 0.00–0.07)
Basophils Absolute: 0 10*3/uL (ref 0.0–0.1)
Basophils Relative: 0 %
Eosinophils Absolute: 0.2 10*3/uL (ref 0.0–0.5)
Eosinophils Relative: 3 %
HCT: 45.7 % (ref 36.0–46.0)
Hemoglobin: 14.4 g/dL (ref 12.0–15.0)
Immature Granulocytes: 0 %
Lymphocytes Relative: 18 %
Lymphs Abs: 1.3 10*3/uL (ref 0.7–4.0)
MCH: 26.9 pg (ref 26.0–34.0)
MCHC: 31.5 g/dL (ref 30.0–36.0)
MCV: 85.4 fL (ref 80.0–100.0)
MONO ABS: 0.6 10*3/uL (ref 0.1–1.0)
Monocytes Relative: 7 %
Neutro Abs: 5.3 10*3/uL (ref 1.7–7.7)
Neutrophils Relative %: 72 %
Platelets: 319 10*3/uL (ref 150–400)
RBC: 5.35 MIL/uL — ABNORMAL HIGH (ref 3.87–5.11)
RDW: 13.9 % (ref 11.5–15.5)
WBC: 7.5 10*3/uL (ref 4.0–10.5)
nRBC: 0 % (ref 0.0–0.2)

## 2018-08-05 LAB — MAGNESIUM: Magnesium: 2.2 mg/dL (ref 1.7–2.4)

## 2018-08-05 MED ORDER — MORPHINE SULFATE (CONCENTRATE) 20 MG/ML PO SOLN: 5.0000 mg | ORAL | 0 refills | Status: DC | PRN

## 2018-08-05 MED ORDER — SODIUM CHLORIDE 0.9 % IV SOLN
240.0000 mg | Freq: Once | INTRAVENOUS | Status: AC
  Administered 2018-08-05: 240 mg via INTRAVENOUS
  Filled 2018-08-05: qty 24

## 2018-08-05 MED ORDER — HEPARIN SOD (PORK) LOCK FLUSH 100 UNIT/ML IV SOLN: 500.0000 [IU] | Freq: Once | INTRAVENOUS | Status: DC | PRN

## 2018-08-05 MED ORDER — CEPHALEXIN 500 MG PO CAPS: 500.0000 mg | ORAL_CAPSULE | Freq: Three times a day (TID) | ORAL | 0 refills | Status: DC

## 2018-08-05 MED ORDER — SODIUM CHLORIDE 0.9 % IV SOLN
Freq: Once | INTRAVENOUS | Status: AC
  Administered 2018-08-05: 10:00:00 via INTRAVENOUS
  Filled 2018-08-05: qty 250

## 2018-08-05 NOTE — Progress Notes (Signed)
Patient here today for follow up regarding head and neck cancer. Patient reports diarrhea improved, completed steroid taper yesterday. Patient also reports continued irritation and drainage from PEG tube.

## 2018-08-05 NOTE — Progress Notes (Signed)
Nutrition Follow-up:  Patient with head and neck cancer stage IV and adenocarcinoma of the colon.  Patient followed by Dr. Grayland Ormond.  G tube placed on 6/7 at [facility].  Patient currently receiving nivolumab.    Met with patient and husband during infusion.  Husband reports that she had diarrhea again for about 1 weeks.  Cut tube feeding back to 3 cartons for about 3-4 days during this time frame.  Reports diarrhea is better. No BM yet today, 1 loose stool yesterday, small BM on Tuesday and none on Monday of this week.   Reports irritation around PEG site continues and Dr. Grayland Ormond gave instructions for care today per husband.      Medications: medrol and keflex (antibiotic) added today for diarrhea  Labs: reviewed  Anthropometrics:   Weight 105 lb 8 oz today, stable from last weight of 105 lb 1.6 oz on 12/05.   Overall weight decrease likely from diarrhea and husband decreasing tube feeding.   11/21 109 12.8 oz 111 lb on 10/17 116 lb on 07/31/2017   UBW 122-124 lb noted 124 lb on 07/06/2017   Estimated Energy Needs  Kcals: 1250-1500 calories/d Protein: 62-75 g Fluid: 1.5 g/d  NUTRITION DIAGNOSIS: Inadequate oral intake continues   INTERVENTION:  Continue to be concerned with weight loss and diarrhea effecting overall nutrition.   Encouraged husband to not cut back on tube feeding.  Would really like to increase tube feeding to 5 cartons of ensure plus to prevent further weight loss.  Husband wants to wait a few more weeks and see how she does.      MONITORING, EVALUATION, GOAL: weight trends, intake, TF tolerance   NEXT VISIT: Jan 2 during infusion  Fawnda Vitullo B. Zenia Resides, [place], [place] Registered Dietitian [phone] (pager)

## 2018-08-06 LAB — THYROID PANEL WITH TSH
Free Thyroxine Index: 2.3 (ref 1.2–4.9)
T3 Uptake Ratio: 26 % (ref 24–39)
T4, Total: 9 ug/dL (ref 4.5–12.0)
TSH: 1.66 u[IU]/mL (ref 0.450–4.500)

## 2018-08-07 DIAGNOSIS — C189 Malignant neoplasm of colon, unspecified: Secondary | ICD-10-CM | POA: Diagnosis not present

## 2018-08-07 DIAGNOSIS — C760 Malignant neoplasm of head, face and neck: Secondary | ICD-10-CM | POA: Diagnosis not present

## 2018-08-13 NOTE — Progress Notes (Signed)
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  INTERVAL HISTORY: Patient returns to clinic today for further evaluation and consideration of cycle 10 of palliative nivolumab.  Dana Perez diarrhea has resolved.  She had some issues with Dana Perez PEG tube, but was evaluated by surgery and these have resolved as well.  She continues have a decreased performance status.  Dana Perez pain is much better controlled.  The mass on Dana Perez neck appears to be smaller. She continues to have difficulty eating and uses Dana Perez PEG tube for 100% of Dana Perez nutrition.  She has no neurologic complaints.  She denies any recent fevers. She has no chest pain or shortness of breath. She has no urinary complaints.  Patient offers no further specific complaints today.  REVIEW OF SYSTEMS:   Review of Systems  Constitutional: Positive for malaise/fatigue. Negative for fever and weight loss.  HENT: Negative.  Negative for sore throat.   Respiratory: Negative.  Negative for cough, shortness of breath and stridor.   Cardiovascular: Negative.  Negative for chest pain and leg swelling.  Gastrointestinal: Negative.  Negative for abdominal pain, constipation, diarrhea, nausea and vomiting.  Genitourinary: Negative.  Negative for dysuria.  Musculoskeletal: Negative.  Negative for falls and joint pain.  Skin: Negative.  Negative for itching and rash.  Neurological: Positive for weakness. Negative for sensory change, focal weakness and headaches.  Psychiatric/Behavioral: Positive for depression and memory loss. Negative for hallucinations. The patient is not nervous/anxious and does not have insomnia.     As  per HPI. Otherwise, a complete review of systems is negative.  PAST MEDICAL HISTORY: Past Medical History:  Diagnosis Date  . Adenocarcinoma of colon ([place])   . Benign neoplasm of ascending colon   . Essential hypertension 06/13/2014  . Goals of care, counseling/discussion 07/14/2017  . HLD (hyperlipidemia) 06/13/2014  . Hypercholesteremia   . Hyperlipidemia   . Multiple thyroid nodules 01/22/2012  . Osteoporosis   . Polycythemia, secondary 12/26/2014  . Pure hypercholesterolemia 03/15/2015  . Squamous cell carcinoma of head and neck ([place])   . Thyroid nodule     PAST SURGICAL HISTORY: Past Surgical History:  Procedure Laterality Date  . ABDOMINAL HYSTERECTOMY    . COLONOSCOPY WITH PROPOFOL N/A 07/02/2017   Procedure: COLONOSCOPY WITH PROPOFOL;  Surgeon: Lucilla Lame, MD;  Location: [place];  Service: Endoscopy;  Laterality: N/A;  . ORIF ANKLE FRACTURE Left 08/08/2017   Procedure: OPEN REDUCTION INTERNAL FIXATION (ORIF) ANKLE FRACTURE;  Surgeon: Dereck Leep, MD;  Location: [facility];  Service: Orthopedics;  Laterality: Left;  . POLYPECTOMY N/A 07/02/2017   Procedure: POLYPECTOMY;  Surgeon: Lucilla Lame, MD;  Location: [place];  Service: Endoscopy;  Laterality: N/A;  . WRIST FRACTURE SURGERY  08/2009   badly broken    FAMILY HISTORY Family History  Problem Relation Age of Onset  . Heart disease Mother   . Diabetes Father        ADVANCED DIRECTIVES:    HEALTH MAINTENANCE: Social History   Tobacco Use  . Smoking status: Former Smoker    Packs/day: 0.25    Types: Cigarettes    Last attempt to quit: 06/17/2017    Years since quitting: 1.1  .  Smokeless tobacco: Never Used  Substance Use Topics  . Alcohol use: No  . Drug use: No     Allergies  Allergen Reactions  . Potassium Nausea Only and Nausea And Vomiting  . Other Other (See Comments) and Rash    TDAP tdap TDAP  . Tetanus Antitoxin Rash    Current Outpatient Medications    Medication Sig Dispense Refill  . calcium carbonate (OSCAL) 1500 (600 Ca) MG TABS tablet Take 600 mg of elemental calcium by mouth daily with breakfast.    . ENSURE (ENSURE) Take 237 mLs by mouth.    . fentaNYL (DURAGESIC - DOSED MCG/HR) 50 MCG/HR Place 50 mcg onto the skin every 3 (three) days.     Marland Kitchen ibuprofen (ADVIL,MOTRIN) 200 MG tablet Take 400 mg by mouth every 4 (four) hours as needed.    . loperamide (IMODIUM A-D) 2 MG tablet Take 1 tablet (2 mg total) by mouth 4 (four) times daily as needed for diarrhea or loose stools. 30 tablet 2  . morphine (ROXANOL) 20 MG/ML concentrated solution Place 0.25 mLs (5 mg total) into feeding tube every 4 (four) hours as needed for moderate pain or severe pain. 30 mL 0  . NARCAN 4 MG/0.1ML LIQD nasal spray kit USE 1 SPRAY PER INTRANASAL ROUTE AS DIRECTED  0  . pravastatin (PRAVACHOL) 10 MG tablet Take 10 mg by mouth daily.    . promethazine (PHENERGAN) 25 MG tablet Take 25 mg by mouth every 6 (six) hours as needed for nausea or vomiting.     No current facility-administered medications for this visit.    Facility-Administered Medications Ordered in Other Visits  Medication Dose Route Frequency Provider Last Rate Last Dose  . heparin lock flush 100 unit/mL  250 Units Intracatheter PRN Leia Alf, MD      . heparin lock flush 100 unit/mL  500 Units Intracatheter PRN Leia Alf, MD      . potassium chloride 20 mEq in sodium chloride 0.9 % 500 mL injection   Intravenous Once Tye Savoy, Doni H, RN      . sodium chloride 0.9 % injection 10 mL  10 mL Intracatheter PRN Leia Alf, MD      . sodium chloride 0.9 % injection 10 mL  10 mL Intracatheter PRN Leia Alf, MD      . sodium chloride 0.9 % injection 10 mL  10 mL Intracatheter PRN Ma Hillock, Sandeep, MD      . sodium chloride 0.9 % injection 10 mL  10 mL Intracatheter PRN Leia Alf, MD      -year-old  OBJECTIVE: Vitals:   08/19/18 1103  BP: 118/72  Pulse: 77  Resp: 18  Temp:  (!) 97.1 F (36.2 C)     Body mass index is 17.48 kg/m.    ECOG FS:3 - Symptomatic, >50% confined to bed  General: Well-developed, well-nourished, no acute distress.  Sitting in a wheelchair Eyes: Pink conjunctiva, anicteric sclera. HEENT: Normocephalic, moist mucous membranes, clear oropharnyx.  Mass on neck nearly resolved. Lungs: Clear to auscultation bilaterally. Heart: Regular rate and rhythm. No rubs, murmurs, or gallops. Abdomen: Soft, nontender, nondistended. No organomegaly noted, normoactive bowel sounds. Musculoskeletal: No edema, cyanosis, or clubbing. Neuro: Alert, answering all questions appropriately. Cranial nerves grossly intact. Skin: No rashes or petechiae noted. Psych: Normal affect.  LAB RESULTS:  Lab Results  Component Value Date   NA 140 08/19/2018   K 4.3 08/19/2018   CL 103 08/19/2018   CO2 26 08/19/2018   GLUCOSE  96 08/19/2018   BUN 24 (H) 08/19/2018   CREATININE 0.91 08/19/2018   CALCIUM 9.3 08/19/2018   PROT 6.8 08/19/2018   ALBUMIN 3.7 08/19/2018   AST 29 08/19/2018   ALT 16 08/19/2018   ALKPHOS 89 08/19/2018   BILITOT 0.4 08/19/2018   GFRNONAA >60 08/19/2018   GFRAA >60 08/19/2018    Lab Results  Component Value Date   WBC 6.1 08/19/2018   NEUTROABS 4.4 08/19/2018   HGB 14.0 08/19/2018   HCT 44.7 08/19/2018   MCV 86.3 08/19/2018   PLT 272 08/19/2018     STUDIES: No results found.  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  PLAN:    1.  Recurrent, progressive stage IVa head and neck squamous cell carcinoma: Despite surgery at [facility] for residual disease, patient continues to have progressive malignancy.  Dana Perez performance status is significantly decreased.  Previously we discussed at length the possibility of enrolling in hospice, but patient ultimately decided she would like to try immunotherapy as one last effort for control of disease.  Patient will receive  nivolumab every 2 weeks until intolerable side effects or progression of disease.  She expressed understanding that this is likely not curative.  Proceed with cycle 10 of nivolumab today.  Return to clinic in 2 weeks for further evaluation and consideration of cycle 11.  Plan to reimage at the conclusion of cycle 12.     2.  Diarrhea: Resolved with Medrol Dosepak.  Continue Imodium and Lomotil as needed.   3.  Decreased performance status: Likely multifactorial.  Chronic and unchanged.  Continue home health and physical therapy. Patient has declined hospice as above.  Continue follow-up with palliative care as indicated. 4.  Nausea: Patient does not complain of this today.  Continue current medications as prescribed. 5.  Pain: Chronic and unchanged.  Continue current narcotic regimen. 6.  Hallucinations: Patient does not complain of this today.  Unclear etiology, but likely medication induced.  Monitor. 7.  PEG tube: Appreciate surgery and dietary input.  I spent a total of 30 minutes face-to-face with the patient of which greater than 50% of the visit was spent in counseling and coordination of care as detailed above.  Patient and Dana Perez husband expressed understanding that they can call or return to clinic at any time if they have any questions, concerns, or complaints.  [name], MD 08/20/18 9:06 AM

## 2018-08-16 DIAGNOSIS — K9423 Gastrostomy malfunction: Secondary | ICD-10-CM | POA: Diagnosis not present

## 2018-08-18 ENCOUNTER — Other Ambulatory Visit: Payer: Self-pay | Admitting: Oncology

## 2018-08-18 DIAGNOSIS — C760 Malignant neoplasm of head, face and neck: Secondary | ICD-10-CM | POA: Diagnosis not present

## 2018-08-18 DIAGNOSIS — C189 Malignant neoplasm of colon, unspecified: Secondary | ICD-10-CM | POA: Diagnosis not present

## 2018-08-19 ENCOUNTER — Other Ambulatory Visit: Payer: Self-pay

## 2018-08-19 ENCOUNTER — Inpatient Hospital Stay (HOSPITAL_BASED_OUTPATIENT_CLINIC_OR_DEPARTMENT_OTHER): Payer: PPO | Admitting: Oncology

## 2018-08-19 ENCOUNTER — Inpatient Hospital Stay: Payer: PPO | Attending: Hospice and Palliative Medicine | Admitting: Hospice and Palliative Medicine

## 2018-08-19 ENCOUNTER — Encounter: Payer: PPO | Admitting: Hospice and Palliative Medicine

## 2018-08-19 ENCOUNTER — Encounter: Payer: Self-pay | Admitting: Oncology

## 2018-08-19 ENCOUNTER — Inpatient Hospital Stay: Payer: PPO

## 2018-08-19 VITALS — BP 118/72 | HR 77 | Temp 97.1°F | Resp 18 | Wt 108.3 lb

## 2018-08-19 DIAGNOSIS — Z87891 Personal history of nicotine dependence: Secondary | ICD-10-CM | POA: Diagnosis not present

## 2018-08-19 DIAGNOSIS — R197 Diarrhea, unspecified: Secondary | ICD-10-CM | POA: Insufficient documentation

## 2018-08-19 DIAGNOSIS — I10 Essential (primary) hypertension: Secondary | ICD-10-CM

## 2018-08-19 DIAGNOSIS — C760 Malignant neoplasm of head, face and neck: Secondary | ICD-10-CM

## 2018-08-19 DIAGNOSIS — Z5112 Encounter for antineoplastic immunotherapy: Secondary | ICD-10-CM | POA: Insufficient documentation

## 2018-08-19 DIAGNOSIS — Z8719 Personal history of other diseases of the digestive system: Secondary | ICD-10-CM | POA: Diagnosis not present

## 2018-08-19 DIAGNOSIS — Z931 Gastrostomy status: Secondary | ICD-10-CM

## 2018-08-19 DIAGNOSIS — G8929 Other chronic pain: Secondary | ICD-10-CM

## 2018-08-19 DIAGNOSIS — G893 Neoplasm related pain (acute) (chronic): Secondary | ICD-10-CM | POA: Insufficient documentation

## 2018-08-19 DIAGNOSIS — L304 Erythema intertrigo: Secondary | ICD-10-CM | POA: Insufficient documentation

## 2018-08-19 DIAGNOSIS — E860 Dehydration: Secondary | ICD-10-CM | POA: Diagnosis not present

## 2018-08-19 DIAGNOSIS — Z79899 Other long term (current) drug therapy: Secondary | ICD-10-CM | POA: Diagnosis not present

## 2018-08-19 DIAGNOSIS — Z515 Encounter for palliative care: Secondary | ICD-10-CM | POA: Insufficient documentation

## 2018-08-19 DIAGNOSIS — K9423 Gastrostomy malfunction: Secondary | ICD-10-CM | POA: Diagnosis not present

## 2018-08-19 LAB — COMPREHENSIVE METABOLIC PANEL
ALBUMIN: 3.7 g/dL (ref 3.5–5.0)
ALT: 16 U/L (ref 0–44)
AST: 29 U/L (ref 15–41)
Alkaline Phosphatase: 89 U/L (ref 38–126)
Anion gap: 11 (ref 5–15)
BUN: 24 mg/dL — ABNORMAL HIGH (ref 8–23)
CHLORIDE: 103 mmol/L (ref 98–111)
CO2: 26 mmol/L (ref 22–32)
Calcium: 9.3 mg/dL (ref 8.9–10.3)
Creatinine, Ser: 0.91 mg/dL (ref 0.44–1.00)
GFR calc Af Amer: >60 mL/min (ref >60)
GFR calc non Af Amer: >60 mL/min (ref >60)
GLUCOSE: 96 mg/dL (ref 70–99)
Potassium: 4.3 mmol/L (ref 3.5–5.1)
Sodium: 140 mmol/L (ref 135–145)
Total Bilirubin: 0.4 mg/dL (ref 0.3–1.2)
Total Protein: 6.8 g/dL (ref 6.5–8.1)

## 2018-08-19 LAB — CBC WITH DIFFERENTIAL/PLATELET
Abs Immature Granulocytes: 0.01 10*3/uL (ref 0.00–0.07)
Basophils Absolute: 0 10*3/uL (ref 0.0–0.1)
Basophils Relative: 0 %
Eosinophils Absolute: 0.1 10*3/uL (ref 0.0–0.5)
Eosinophils Relative: 2 %
HCT: 44.7 % (ref 36.0–46.0)
Hemoglobin: 14 g/dL (ref 12.0–15.0)
Immature Granulocytes: 0 %
Lymphocytes Relative: 20 %
Lymphs Abs: 1.2 10*3/uL (ref 0.7–4.0)
MCH: 27 pg (ref 26.0–34.0)
MCHC: 31.3 g/dL (ref 30.0–36.0)
MCV: 86.3 fL (ref 80.0–100.0)
Monocytes Absolute: 0.4 10*3/uL (ref 0.1–1.0)
Monocytes Relative: 6 %
Neutro Abs: 4.4 10*3/uL (ref 1.7–7.7)
Neutrophils Relative %: 72 %
Platelets: 272 10*3/uL (ref 150–400)
RBC: 5.18 MIL/uL — ABNORMAL HIGH (ref 3.87–5.11)
RDW: 14.2 % (ref 11.5–15.5)
WBC: 6.1 10*3/uL (ref 4.0–10.5)
nRBC: 0 % (ref 0.0–0.2)

## 2018-08-19 LAB — MAGNESIUM: Magnesium: 2.1 mg/dL (ref 1.7–2.4)

## 2018-08-19 MED ORDER — SODIUM CHLORIDE 0.9 % IV SOLN
240.0000 mg | Freq: Once | INTRAVENOUS | Status: AC
  Administered 2018-08-19: 240 mg via INTRAVENOUS
  Filled 2018-08-19: qty 24

## 2018-08-19 MED ORDER — SODIUM CHLORIDE 0.9 % IV SOLN
Freq: Once | INTRAVENOUS | Status: AC
  Administered 2018-08-19: 12:00:00 via INTRAVENOUS
  Filled 2018-08-19: qty 250

## 2018-08-19 NOTE — Progress Notes (Signed)
Nutrition Follow-up:  Patient with head and neck cancer stage IV and adenocarcinoma of the colon.  Patient followed by Dr. Grayland Ormond.  G tube placed on 6/7 at [facility].  Patient receiving nivolumab.    Met with patient and husband during infusion.  Husband reports patient had issues with nausea and vomiting for about 1 week.  Reports feeding tube was displaced (pushed back into stomach) recently and husband took patient back to surgeon on Monday 12/30 to have it adjusted (issues with leaking around PEG site as well).  All of this is better per husband.  Husband reports clump of skin and debris in mouth that is causing nausea and vomiting.  Reports that they clean mouth regularly and patient coughs to get clump of skin up and out of mouth.    Husband has been giving 4 cartons of ensure plus daily but during times of nausea giving less.    Reports no issues with diarrhea currently  Medications: reviewed  Labs: reviewed  Anthropometrics:   Weight increased to 108 lb 4.8 oz today.  Husband reports patient was weighed without jacket and bedroom slippers on.   Increased from weight of 105 lb 8 oz on 12/19.     Estimated Energy Needs  Kcals: 1250-1500 calories Protein: 62-75 g Fluid: 1.5 L/d  NUTRITION DIAGNOSIS: Inadequate oral intake continues but receiving nutrition from PEG feeding   INTERVENTION:  Continue with current tube feeding regimen of ensure plus 4 cartons per day.  Husband dividing 4 cartons into 5 feedings per day (3 syringe full)    MONITORING, EVALUATION, GOAL: weight trends, intake, TF tolerance   NEXT VISIT: Jan 16 during infusion  Muskan Bolla B. Zenia Resides, [place], [place] Registered Dietitian [phone] (pager)

## 2018-08-19 NOTE — Progress Notes (Signed)
[place]  [phone] [phone]) [phone]   Name: Dana Perez Date: 08/19/2018 MRN: [id number]  DOB: [birth year]  Patient Care Team: Glendon Axe, MD as PCP - General (Internal Medicine) [name], MD as Referring Physician (Surgery)    REASON FOR CONSULTATION: Palliative Care consult requested for this 78 y.o. 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  PMH also includes adenocarcinoma of the colon not currently on treatment, malnutrition status post PEG insertion, she had recent bilateral ankle fractures causing a marked reduction in her performance status, chronic pain on transdermal fentanyl, hypertension, hyperlipidemia, and CKD stage III.  Patient has had disease progression and general decline despite ongoing treatment of her head & neck cancer.  Hospice had been discussed but patient/husband opted to continue treatment with nivolumab.  Palliative care is now asked to follow and provide her with support.   SOCIAL HISTORY:  Patient is married.  She lives at home with her husband.  She has no children.  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  ADVANCE DIRECTIVES:  Patient does not currently have advance directives.  She would want her husband to be her decision-maker.  Patient and husband have talked about completing a living will but have not currently done so.  CODE STATUS: DNR  PAST MEDICAL HISTORY: Past Medical History:  Diagnosis Date  . Adenocarcinoma of colon ([place])   . Benign neoplasm of ascending colon   . Essential hypertension 06/13/2014  . Goals of care, counseling/discussion 07/14/2017  . HLD (hyperlipidemia) 06/13/2014  . Hypercholesteremia   . Hyperlipidemia   .  Multiple thyroid nodules 01/22/2012  . Osteoporosis   . Polycythemia, secondary 12/26/2014  . Pure hypercholesterolemia 03/15/2015  . Squamous cell carcinoma of head and neck ([place])   . Thyroid nodule     PAST SURGICAL HISTORY:  Past Surgical History:  Procedure Laterality Date  . ABDOMINAL HYSTERECTOMY    . COLONOSCOPY WITH PROPOFOL N/A 07/02/2017   Procedure: COLONOSCOPY WITH PROPOFOL;  Surgeon: Lucilla Lame, MD;  Location: [place];  Service: Endoscopy;  Laterality: N/A;  . ORIF ANKLE FRACTURE Left 08/08/2017   Procedure: OPEN REDUCTION INTERNAL FIXATION (ORIF) ANKLE FRACTURE;  Surgeon: Dereck Leep, MD;  Location: [facility];  Service: Orthopedics;  Laterality: Left;  . POLYPECTOMY N/A 07/02/2017   Procedure: POLYPECTOMY;  Surgeon: Lucilla Lame, MD;  Location: [place];  Service: Endoscopy;  Laterality: N/A;  . WRIST FRACTURE SURGERY  08/2009   badly broken    HEMATOLOGY/ONCOLOGY HISTORY:    Squamous cell carcinoma of head and neck ([place])   07/01/2017 Initial Diagnosis    Squamous cell carcinoma of head and neck ([place])    04/08/2018 -  Chemotherapy    The patient had nivolumab (OPDIVO) 240 mg in sodium chloride 0.9 % 100 mL chemo infusion, 240 mg, Intravenous, Once, 9 of 12 cycles Administration: 240 mg (04/08/2018), 240 mg (04/22/2018), 240 mg (05/06/2018), 240 mg (05/20/2018), 240 mg (06/03/2018), 240 mg (06/24/2018), 240 mg (07/08/2018), 240 mg (07/22/2018), 240 mg (08/05/2018)  for chemotherapy treatment.      ALLERGIES:  is allergic to potassium; other; and tetanus antitoxin.  MEDICATIONS:  Current Outpatient Medications  Medication Sig Dispense Refill  . calcium carbonate (OSCAL) 1500 (600 Ca) MG TABS tablet Take 600  mg of elemental calcium by mouth daily with breakfast.    . ENSURE (ENSURE) Take 237 mLs by mouth.    . fentaNYL (DURAGESIC - DOSED MCG/HR) 50 MCG/HR Place 50 mcg onto the skin every 3 (three) days.     Marland Kitchen ibuprofen (ADVIL,MOTRIN) 200 MG tablet  Take 400 mg by mouth every 4 (four) hours as needed.    . loperamide (IMODIUM A-D) 2 MG tablet Take 1 tablet (2 mg total) by mouth 4 (four) times daily as needed for diarrhea or loose stools. 30 tablet 2  . morphine (ROXANOL) 20 MG/ML concentrated solution Place 0.25 mLs (5 mg total) into feeding tube every 4 (four) hours as needed for moderate pain or severe pain. 30 mL 0  . NARCAN 4 MG/0.1ML LIQD nasal spray kit USE 1 SPRAY PER INTRANASAL ROUTE AS DIRECTED  0  . pravastatin (PRAVACHOL) 10 MG tablet Take 10 mg by mouth daily.    . promethazine (PHENERGAN) 25 MG tablet Take 25 mg by mouth every 6 (six) hours as needed for nausea or vomiting.     No current facility-administered medications for this visit.    Facility-Administered Medications Ordered in Other Visits  Medication Dose Route Frequency Provider Last Rate Last Dose  . heparin lock flush 100 unit/mL  250 Units Intracatheter PRN Leia Alf, MD      . heparin lock flush 100 unit/mL  500 Units Intracatheter PRN Leia Alf, MD      . potassium chloride 20 mEq in sodium chloride 0.9 % 500 mL injection   Intravenous Once Tye Savoy, Doni H, RN      . sodium chloride 0.9 % injection 10 mL  10 mL Intracatheter PRN Leia Alf, MD      . sodium chloride 0.9 % injection 10 mL  10 mL Intracatheter PRN Leia Alf, MD      . sodium chloride 0.9 % injection 10 mL  10 mL Intracatheter PRN Ma Hillock, Sandeep, MD      . sodium chloride 0.9 % injection 10 mL  10 mL Intracatheter PRN Leia Alf, MD        VITAL SIGNS: There were no vitals taken for this visit. There were no vitals filed for this visit.  Estimated body mass index is 17.48 kg/m as calculated from the following:   Height as of 06/03/18: 5' 6"  (1.676 m).   Weight as of an earlier encounter on 08/19/18: 108 lb 4.8 oz (49.1 kg).  LABS: CBC:    Component Value Date/Time   WBC 6.1 08/19/2018 1034   HGB 14.0 08/19/2018 1034   HCT 44.7 08/19/2018 1034   PLT 272  08/19/2018 1034   MCV 86.3 08/19/2018 1034   NEUTROABS 4.4 08/19/2018 1034   LYMPHSABS 1.2 08/19/2018 1034   MONOABS 0.4 08/19/2018 1034   EOSABS 0.1 08/19/2018 1034   BASOSABS 0.0 08/19/2018 1034   Comprehensive Metabolic Panel:    Component Value Date/Time   NA 140 08/19/2018 1034   K 4.3 08/19/2018 1034   CL 103 08/19/2018 1034   CO2 26 08/19/2018 1034   BUN 24 (H) 08/19/2018 1034   CREATININE 0.91 08/19/2018 1034   GLUCOSE 96 08/19/2018 1034   CALCIUM 9.3 08/19/2018 1034   AST 29 08/19/2018 1034   ALT 16 08/19/2018 1034   ALKPHOS 89 08/19/2018 1034   BILITOT 0.4 08/19/2018 1034   PROT 6.8 08/19/2018 1034   ALBUMIN 3.7 08/19/2018 1034    RADIOGRAPHIC STUDIES: No results found.  PERFORMANCE STATUS (ECOG) :  3 - Symptomatic, >50% confined to bed  Review of Systems As noted above. Otherwise, a complete review of systems is negative.  Physical Exam General: ill appearing, in wheelchair HEENT: necrotic appearing neck mass, oral mucosa dry and flaky Lungs: Clear to auscultation bilat Heart: RRR Abdomen: Soft, nontender, PEG noted covered in dressing Musculoskeletal: No edema, cyanosis, or clubbing. Skin: scattered skin lesions Neuro: Alert, answers some questions   IMPRESSION: Follow-up visit today with patient and husband in the clinic.    Husband reports the patient is doing well. He has had some recent leakage around PEG tube and saw surgery for that. Discussed use of a barrier cream to prevent skin breakdown.   Pain is reportedly stable with prn use of morphine elixir and TD fentanyl.   Husband denies any other symptomatic concerns today.   Weight appears stable. Patient's weight increased 3lbs to 108lbs over past month.  PLAN: 1.  Continue morphine elixir 5 mg every 4-6 hours as needed for pain 2.  Continue transdermal fentanyl 50 mcg every 72 hours 3.  RTC in 1 month   Patient expressed understanding and was in agreement with this plan. She also  understands that She can call clinic at any time with any questions, concerns, or complaints.    Time Total: 15 minutes  Visit consisted of counseling and education dealing with the complex and emotionally intense issues of symptom management and palliative care in the setting of serious and potentially life-threatening illness.Greater than 50%  of this time was spent counseling and coordinating care related to the above assessment and plan.  Signed by: Altha Harm, [place], NP-C, [place] (Work Cell)

## 2018-08-19 NOTE — Progress Notes (Signed)
Patient here today for follow up regarding head and neck cancer. Patient denies any concerns today.

## 2018-08-20 LAB — THYROID PANEL WITH TSH
Free Thyroxine Index: 1.9 (ref 1.2–4.9)
T3 Uptake Ratio: 22 % — ABNORMAL LOW (ref 24–39)
T4, Total: 8.6 ug/dL (ref 4.5–12.0)
TSH: 2.49 u[IU]/mL (ref 0.450–4.500)

## 2018-08-23 ENCOUNTER — Telehealth: Payer: Self-pay | Admitting: *Deleted

## 2018-08-23 MED ORDER — PREDNISONE 10 MG (21) PO TBPK: ORAL_TABLET | ORAL | 0 refills | Status: DC

## 2018-08-23 NOTE — Telephone Encounter (Signed)
Medrol dose pack. Thanks!

## 2018-08-23 NOTE — Telephone Encounter (Signed)
Prescription sent and Mr [facility] notified

## 2018-08-23 NOTE — Telephone Encounter (Signed)
Husband called reporting that patient has severe diarrhea again and needs medicine called in as before. Please advise

## 2018-08-26 ENCOUNTER — Other Ambulatory Visit: Payer: Self-pay | Admitting: *Deleted

## 2018-08-26 ENCOUNTER — Telehealth: Payer: Self-pay | Admitting: *Deleted

## 2018-08-26 DIAGNOSIS — R197 Diarrhea, unspecified: Secondary | ICD-10-CM

## 2018-08-26 NOTE — Telephone Encounter (Signed)
Appointment accepted for tomorrow am at 830

## 2018-08-26 NOTE — Telephone Encounter (Signed)
sband calling to report that patient still has diarrhea and that the prednisone has not helped. She cancelled her appointment with Dr Baruch Gouty due to diarrhea. Please advise

## 2018-08-26 NOTE — Telephone Encounter (Signed)
[facility] tommorow with IVF and lab.

## 2018-08-27 ENCOUNTER — Inpatient Hospital Stay: Payer: PPO

## 2018-08-27 ENCOUNTER — Other Ambulatory Visit: Payer: Self-pay | Admitting: *Deleted

## 2018-08-27 ENCOUNTER — Ambulatory Visit: Payer: PPO | Admitting: Radiation Oncology

## 2018-08-27 ENCOUNTER — Inpatient Hospital Stay (HOSPITAL_BASED_OUTPATIENT_CLINIC_OR_DEPARTMENT_OTHER): Payer: PPO | Admitting: Oncology

## 2018-08-27 ENCOUNTER — Encounter: Payer: Self-pay | Admitting: Oncology

## 2018-08-27 VITALS — BP 135/75 | HR 79 | Temp 96.9°F | Resp 18

## 2018-08-27 DIAGNOSIS — R197 Diarrhea, unspecified: Secondary | ICD-10-CM

## 2018-08-27 DIAGNOSIS — Z931 Gastrostomy status: Secondary | ICD-10-CM | POA: Diagnosis not present

## 2018-08-27 DIAGNOSIS — R110 Nausea: Secondary | ICD-10-CM

## 2018-08-27 DIAGNOSIS — E860 Dehydration: Secondary | ICD-10-CM | POA: Diagnosis not present

## 2018-08-27 DIAGNOSIS — C760 Malignant neoplasm of head, face and neck: Secondary | ICD-10-CM | POA: Diagnosis not present

## 2018-08-27 DIAGNOSIS — R1310 Dysphagia, unspecified: Secondary | ICD-10-CM

## 2018-08-27 DIAGNOSIS — Z5112 Encounter for antineoplastic immunotherapy: Secondary | ICD-10-CM | POA: Diagnosis not present

## 2018-08-27 DIAGNOSIS — L304 Erythema intertrigo: Secondary | ICD-10-CM | POA: Diagnosis not present

## 2018-08-27 LAB — CBC WITH DIFFERENTIAL/PLATELET
Abs Immature Granulocytes: 0.03 10*3/uL (ref 0.00–0.07)
Basophils Absolute: 0 10*3/uL (ref 0.0–0.1)
Basophils Relative: 0 %
Eosinophils Absolute: 0 10*3/uL (ref 0.0–0.5)
Eosinophils Relative: 0 %
HCT: 45.6 % (ref 36.0–46.0)
Hemoglobin: 14.2 g/dL (ref 12.0–15.0)
Immature Granulocytes: 0 %
Lymphocytes Relative: 15 %
Lymphs Abs: 1.5 10*3/uL (ref 0.7–4.0)
MCH: 27.4 pg (ref 26.0–34.0)
MCHC: 31.1 g/dL (ref 30.0–36.0)
MCV: 87.9 fL (ref 80.0–100.0)
MONO ABS: 0.5 10*3/uL (ref 0.1–1.0)
Monocytes Relative: 5 %
Neutro Abs: 7.5 10*3/uL (ref 1.7–7.7)
Neutrophils Relative %: 80 %
Platelets: 322 10*3/uL (ref 150–400)
RBC: 5.19 MIL/uL — AB (ref 3.87–5.11)
RDW: 14.2 % (ref 11.5–15.5)
WBC: 9.5 10*3/uL (ref 4.0–10.5)
nRBC: 0 % (ref 0.0–0.2)

## 2018-08-27 LAB — COMPREHENSIVE METABOLIC PANEL
ALT: 17 U/L (ref 0–44)
AST: 28 U/L (ref 15–41)
Albumin: 3.7 g/dL (ref 3.5–5.0)
Alkaline Phosphatase: 85 U/L (ref 38–126)
Anion gap: 10 (ref 5–15)
BUN: 31 mg/dL — ABNORMAL HIGH (ref 8–23)
CHLORIDE: 110 mmol/L (ref 98–111)
CO2: 23 mmol/L (ref 22–32)
Calcium: 9 mg/dL (ref 8.9–10.3)
Creatinine, Ser: 0.91 mg/dL (ref 0.44–1.00)
GFR calc Af Amer: >60 mL/min (ref >60)
Glucose, Bld: 104 mg/dL — ABNORMAL HIGH (ref 70–99)
Potassium: 3.8 mmol/L (ref 3.5–5.1)
Sodium: 143 mmol/L (ref 135–145)
Total Bilirubin: 0.4 mg/dL (ref 0.3–1.2)
Total Protein: 6.8 g/dL (ref 6.5–8.1)

## 2018-08-27 LAB — GASTROINTESTINAL PANEL BY PCR, STOOL (REPLACES STOOL CULTURE)
Adenovirus F40/41: NOT DETECTED
Astrovirus: NOT DETECTED
Campylobacter species: NOT DETECTED
Cryptosporidium: NOT DETECTED
Cyclospora cayetanensis: NOT DETECTED
ENTEROTOXIGENIC E COLI (ETEC): NOT DETECTED
Entamoeba histolytica: NOT DETECTED
Enteroaggregative E coli (EAEC): NOT DETECTED
Enteropathogenic E coli (EPEC): NOT DETECTED
Giardia lamblia: NOT DETECTED
Norovirus GI/GII: NOT DETECTED
Plesimonas shigelloides: NOT DETECTED
Rotavirus A: NOT DETECTED
Salmonella species: NOT DETECTED
Sapovirus (I, II, IV, and V): NOT DETECTED
Shiga like toxin producing E coli (STEC): NOT DETECTED
Shigella/Enteroinvasive E coli (EIEC): NOT DETECTED
VIBRIO SPECIES: NOT DETECTED
Vibrio cholerae: NOT DETECTED
Yersinia enterocolitica: NOT DETECTED

## 2018-08-27 LAB — C DIFFICILE QUICK SCREEN W PCR REFLEX
C Diff antigen: POSITIVE — AB
C Diff toxin: NEGATIVE

## 2018-08-27 LAB — MAGNESIUM: MAGNESIUM: 2.3 mg/dL (ref 1.7–2.4)

## 2018-08-27 LAB — CLOSTRIDIUM DIFFICILE BY PCR, REFLEXED: Toxigenic C. Difficile by PCR: NEGATIVE

## 2018-08-27 MED ORDER — NYSTATIN 100000 UNIT/GM EX POWD: Freq: Four times a day (QID) | CUTANEOUS | 0 refills | Status: DC

## 2018-08-27 MED ORDER — ONDANSETRON HCL 4 MG/2ML IJ SOLN
8.0000 mg | Freq: Once | INTRAMUSCULAR | Status: AC
  Administered 2018-08-27: 8 mg via INTRAVENOUS
  Filled 2018-08-27: qty 4

## 2018-08-27 MED ORDER — NYSTATIN 100000 UNIT/ML MT SUSP: 5.0000 mL | Freq: Four times a day (QID) | OROMUCOSAL | 0 refills | Status: DC

## 2018-08-27 MED ORDER — DIPHENOXYLATE-ATROPINE 2.5-0.025 MG PO TABS: 1.0000 | ORAL_TABLET | Freq: Four times a day (QID) | ORAL | 0 refills | Status: DC | PRN

## 2018-08-27 MED ORDER — SODIUM CHLORIDE 0.9 % IV SOLN
Freq: Once | INTRAVENOUS | Status: AC
  Administered 2018-08-27: 10:00:00 via INTRAVENOUS
  Filled 2018-08-27: qty 250

## 2018-08-27 NOTE — Progress Notes (Addendum)
Symptom Management Consult note [facility]  [phone] [phone]) [phone]  Patient Perez Team: Dana Axe, MD as PCP - General (Internal Medicine) Dana Patience, MD as Referring Physician (Surgery)   Name of the patient: Dana Perez  [id number]  September 23, 1940   Date of visit: 08/27/2018  Diagnosis: Squamous Cell Carcinoma of head and neck   Chief Complaint: Diarrhea  Current Treatment: s/p cycle 10 Opdivo  Oncology History: Patient last seen by primary oncologist Dana Perez on 08/19/2018 prior to cycle 10 of palliative Opdivo.  Issues with PEG tube had resolved. Diarrhea from previous visits had essentially resolved with Medrol Dosepak.  Performance status continued to decline.  Pain (mainly neck ) was better controlled with new narcotic regimen.  The mass on her neck appeared to be smaller.  She continued to experience dysphagia and husband admitted to using her PEG tube for 100% of her nutrition.  Palliative Perez was initiated.  Hospice was deferred for now.  Plan was to continue palliative Opdivo cycle 10 and to reimage after cycle 12.  Seen and evaluated by Dana Perez with palliative Perez on 08/19/2018 where narcotic regimen (morphine elixir 5 mg every 4-6 hours as needed and transdermal fentanyl 50 mcg every 72 hours) was continued.  She is scheduled to return to clinic in 1 month for follow-up.  In the interim, she began having severe diarrhea and was prescribed a steroid Dosepak on 08/23/2018.  Today, patient's husband called clinic stating steroids were not helping her diarrhea.  He felt like she was dehydrated and needed to be seen for possible IV fluids.    Squamous cell carcinoma of head and neck (H[facility])   07/01/2017 Initial Diagnosis    Squamous cell carcinoma of head and neck ([place])    04/08/2018 -  Chemotherapy    The patient had nivolumab (OPDIVO) 240 mg in sodium chloride 0.9 % 100 mL chemo infusion, 240 mg, Intravenous,  Once, 10 of 12 cycles Administration: 240 mg (04/08/2018), 240 mg (04/22/2018), 240 mg (05/06/2018), 240 mg (05/20/2018), 240 mg (06/03/2018), 240 mg (06/24/2018), 240 mg (07/08/2018), 240 mg (07/22/2018), 240 mg (08/05/2018), 240 mg (08/19/2018)  for chemotherapy treatment.      Subjective Data:  ECOG: 3 - Symptomatic, >50% confined to bed   Subjective:     Dana Perez is a 78 y.o. female who presents for evaluation of diarrhea. Onset of diarrhea was 4 days ago. Diarrhea is occurring approximately 6 times per day. Patient describes diarrhea as watery. Diarrhea has been associated with Administration of recent immunotherapy (08/19/18) and PEG tube feedings. . Patient denies blood in stool, fever, recent antibiotic use. Previous visits for diarrhea: yes, last seen 1 month ago by Dana Perez. Evaluation to date: stool cultures.  Treatment to date: Has been treated on several occasions for diarrhea.  Given Medrol Dosepak on 07/30/2018 and most recently 08/20/18.  Tested positive for STEC E. coli on 06/25/2018 and positive for C. difficile antigen.  She was treated with antibiotics with improvement.  The following portions of the patient's history were reviewed and updated as appropriate: allergies, current medications, past family history, past medical history, past social history, past surgical history and problem list.  Review of Systems A comprehensive review of systems was negative except for: Constitutional: positive for anorexia, fatigue and malaise Gastrointestinal: positive for diarrhea and reflux symptoms Integument/breast: positive for Pallor Musculoskeletal: positive for muscle weakness Neurological: positive for coordination problems and weakness    Objective:  There were no vitals taken for this visit. General: alert, distracted, fatigued, no distress and pale  Hydration:  moderately dehydrated  Abdomen:    soft, non-tender; bowel sounds normal; no masses,  no organomegaly and mild  bloating    Assessment:    Diarrhea, severe in severity Unclear etiology.  Appears to be worsened after immunotherapy treatment and tube feedings.  Plan:    Lab studies per orders. Medications per orders. OTC antidiarrheals may be used judiciously. Stool studies per orders.    Progressive stage IV a head and neck squamous cell carcinoma: S/p 10 cycles of palliative Opdivo.  Last given on 08/19/2018.  Plan is for every 2 week Opdivo and reimaging at the conclusion of cycle 12.  Intermittent diarrhea that previously resolved with steroids thought to be d/t chemotherapy-induced colitis.  Previous stool studies revealed E. coli and C. difficile antigen. Received treatment with antibiotics.   Previous therapies included Erbitux (12/18-1/19) and 3 cycles cisplatin (11/18-12/19) with concurrent radiation.  Had right neck dissection glossectomy, right segmental mandibulectomy ( Dr. Ihor Austin) , L1-3 right neck dissection and right pectoralis flap on 01/07/2018 at [facility].  Had FNA of the left submental area mass on 03/12/2018 revealing metastatic squamous cell carcinoma. Had gastrotomy tube placed (June 2019).  Incidentally found to have colon cancer after polypectomy.  Unfortunately, not a surgical candidate.  Diarrhea Grade 2/3 refractory to imodium: Rx Lomotil.  Patient only taking Imodium twice daily.  Instructed patient and husband to take Imodium after each loose stool but not to exceed 14 mg daily.  If diarrhea continues to remain uncontrolled, encouraged to use Lomotil QID.  Prescription sent into pharmacy.  Spoke to Dana Perez regarding worsening diarrhea refractory to steroids and Imodium.  Recommend CT scans ASAP to rule out progression of disease and or acute process. Orders placed.  Intertrigo/bilateral breasts: RX nystatin powder.  Likely due to steroid use.  Previously using Desitin without relief.  Apply 4 times daily under the breast.  Mouth hygiene: History of sloughing of buccal membranes  d/t previous radaition.  Continue vigorous oral hygiene with Biotene and/or baking soda. Can try Nystatin. White plaque like appearance on buccal membranes. RX Nystatin solution.   Dehydration: Mild elevation in kidney function.  Recommend tube feeds as instructed by dietitian.  Do not hold tube feeds.  Continue free water as recommended.  Plan: Give 1 L NaCl in clinic. Give 8 mg Zofran in clinic. Have stat CT abdomen/pelvis/chest/neck scheduled. Scheduled for Monday morning.  Referral to speech therapy. Previously used Well Perez.  Stat stool sample to rule out infection. Positive C. difficile antigen negative toxin.  GI panel negative.   Addendum: Patient's husband called on 08/30/2018 stating diarrhea has worsened and Lomotil was not helping.  He does not think she will tolerate contrast through her PEG tube.  Spoke with Dana Perez, and it is okay for her to change to CT abdomen/pelvis without contrast.  Ebony Hail from CT updated.  CT scan revealed no acute abnormalities.  Instructed patient to continue Imodium PRN for loose stools, continue tube feeds as instructed by dietitian.  RX Questran 4 mg TID to help slow diarrhea.   Greater than 50% was spent in counseling and coordination of Perez with this patient including but not limited to discussion of the relevant topics above (See A&P) including, but not limited to diagnosis and management of acute and chronic medical conditions.   Faythe Casa, Perez 08/27/2018 3:48 PM

## 2018-08-28 NOTE — Progress Notes (Signed)
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  INTERVAL HISTORY: Patient returns to clinic today for further evaluation and consideration of cycle 11 of palliative nivolumab.  She continues to have significant diarrhea, but her husband reports this has improved. She continues have a decreased performance status.  Her pain is much better controlled.  The mass on her neck appears to be smaller. She continues to have difficulty eating and uses her PEG tube for 100% of her nutrition.  She has no neurologic complaints.  She denies any recent fevers. She has no chest pain or shortness of breath. She has no urinary complaints.  Patient offers no further specific complaints today.  REVIEW OF SYSTEMS:   Review of Systems  Constitutional: Positive for malaise/fatigue. Negative for fever and weight loss.  HENT: Negative.  Negative for sore throat.   Respiratory: Negative.  Negative for cough, shortness of breath and stridor.   Cardiovascular: Negative.  Negative for chest pain and leg swelling.  Gastrointestinal: Positive for diarrhea. Negative for abdominal pain, constipation, nausea and vomiting.  Genitourinary: Negative.  Negative for dysuria.  Musculoskeletal: Negative.  Negative for falls and joint pain.  Skin: Negative.  Negative for itching and rash.  Neurological: Positive for weakness. Negative for sensory change, focal weakness and headaches.  Psychiatric/Behavioral: Positive for depression and memory loss. Negative for hallucinations. The patient is not nervous/anxious and does not have insomnia.     As per HPI. Otherwise, a complete review of  systems is negative.  PAST MEDICAL HISTORY: Past Medical History:  Diagnosis Date  . Adenocarcinoma of colon ([place])   . Benign neoplasm of ascending colon   . Essential hypertension 06/13/2014  . Goals of care, counseling/discussion 07/14/2017  . HLD (hyperlipidemia) 06/13/2014  . Hypercholesteremia   . Hyperlipidemia   . Multiple thyroid nodules 01/22/2012  . Osteoporosis   . Polycythemia, secondary 12/26/2014  . Pure hypercholesterolemia 03/15/2015  . Squamous cell carcinoma of head and neck ([place])   . Thyroid nodule     PAST SURGICAL HISTORY: Past Surgical History:  Procedure Laterality Date  . ABDOMINAL HYSTERECTOMY    . COLONOSCOPY WITH PROPOFOL N/A 07/02/2017   Procedure: COLONOSCOPY WITH PROPOFOL;  Surgeon: Lucilla Lame, MD;  Location: [facility];  Service: Endoscopy;  Laterality: N/A;  . ORIF ANKLE FRACTURE Left 08/08/2017   Procedure: OPEN REDUCTION INTERNAL FIXATION (ORIF) ANKLE FRACTURE;  Surgeon: Dereck Leep, MD;  Location: [facility];  Service: Orthopedics;  Laterality: Left;  . POLYPECTOMY N/A 07/02/2017   Procedure: POLYPECTOMY;  Surgeon: Lucilla Lame, MD;  Location: [facility];  Service: Endoscopy;  Laterality: N/A;  . WRIST FRACTURE SURGERY  08/2009   badly broken    FAMILY HISTORY Family History  Problem Relation Age of Onset  . Heart disease Mother   . Diabetes Father        ADVANCED DIRECTIVES:    HEALTH MAINTENANCE: Social History   Tobacco Use  . Smoking status: Former Smoker    Packs/day: 0.25    Types: Cigarettes    Last attempt to quit: 06/17/2017    Years since quitting: 1.2  . Smokeless tobacco: Never Used  Substance Use Topics  . Alcohol use:  No  . Drug use: No     Allergies  Allergen Reactions  . Potassium Nausea Only and Nausea And Vomiting  . Other Other (See Comments) and Rash    TDAP tdap TDAP  . Tetanus Antitoxin Rash    Current Outpatient Medications  Medication Sig Dispense Refill  . calcium  carbonate (OSCAL) 1500 (600 Ca) MG TABS tablet Take 600 mg of elemental calcium by mouth daily with breakfast.    . diphenoxylate-atropine (LOMOTIL) 2.5-0.025 MG tablet Take 1 tablet by mouth 4 (four) times daily as needed for diarrhea or loose stools. 30 tablet 0  . ENSURE (ENSURE) Take 237 mLs by mouth.    . fentaNYL (DURAGESIC - DOSED MCG/HR) 50 MCG/HR Place 50 mcg onto the skin every 3 (three) days.     Marland Kitchen ibuprofen (ADVIL,MOTRIN) 200 MG tablet Take 400 mg by mouth every 4 (four) hours as needed.    . loperamide (IMODIUM A-D) 2 MG tablet Take 1 tablet (2 mg total) by mouth 4 (four) times daily as needed for diarrhea or loose stools. 30 tablet 2  . morphine (ROXANOL) 20 MG/ML concentrated solution Place 0.25 mLs (5 mg total) into feeding tube every 4 (four) hours as needed for moderate pain or severe pain. 30 mL 0  . NARCAN 4 MG/0.1ML LIQD nasal spray kit USE 1 SPRAY PER INTRANASAL ROUTE AS DIRECTED  0  . nystatin (MYCOSTATIN/NYSTOP) powder Apply topically 4 (four) times daily. Under breasts 15 g 0  . pravastatin (PRAVACHOL) 10 MG tablet Take 10 mg by mouth daily.    . predniSONE (STERAPRED UNI-PAK 21 TAB) 10 MG (21) TBPK tablet Take as directed 21 tablet 0  . promethazine (PHENERGAN) 25 MG tablet Take 25 mg by mouth every 6 (six) hours as needed for nausea or vomiting.    . cholestyramine (QUESTRAN) 4 GM/DOSE powder Take 1 packet (4 g total) by mouth 3 (three) times daily with meals. (Patient not taking: Reported on 09/02/2018) 378 g 12   No current facility-administered medications for this visit.    Facility-Administered Medications Ordered in Other Visits  Medication Dose Route Frequency Provider Last Rate Last Dose  . heparin lock flush 100 unit/mL  250 Units Intracatheter PRN Leia Alf, MD      . heparin lock flush 100 unit/mL  500 Units Intracatheter PRN Leia Alf, MD      . potassium chloride 20 mEq in sodium chloride 0.9 % 500 mL injection   Intravenous Once Tye Savoy, Doni  H, RN      . sodium chloride 0.9 % injection 10 mL  10 mL Intracatheter PRN Leia Alf, MD      . sodium chloride 0.9 % injection 10 mL  10 mL Intracatheter PRN Ma Hillock, Sandeep, MD      . sodium chloride 0.9 % injection 10 mL  10 mL Intracatheter PRN Ma Hillock, Sandeep, MD      . sodium chloride 0.9 % injection 10 mL  10 mL Intracatheter PRN Leia Alf, MD      -year-old  OBJECTIVE: Vitals:   09/02/18 0956  BP: 106/74  Pulse: (!) 120  Temp: (!) 97.3 F (36.3 C)     Body mass index is 16.59 kg/m.    ECOG FS:3 - Symptomatic, >50% confined to bed  General: Well-developed, well-nourished, no acute distress.  Sitting in a wheelchair. Eyes: Pink conjunctiva, anicteric sclera. HEENT: Normocephalic, moist mucous membranes, clear oropharnyx.  Mass on neck significantly improved. Lungs: Clear to auscultation bilaterally. Heart: Regular rate  and rhythm. No rubs, murmurs, or gallops. Abdomen: Soft, nontender, nondistended. No organomegaly noted, normoactive bowel sounds. Musculoskeletal: No edema, cyanosis, or clubbing. Neuro: Alert, answering all questions appropriately. Cranial nerves grossly intact. Skin: No rashes or petechiae noted. Psych: Normal affect.  LAB RESULTS:  Lab Results  Component Value Date   NA 141 09/02/2018   K 4.1 09/02/2018   CL 105 09/02/2018   CO2 25 09/02/2018   GLUCOSE 98 09/02/2018   BUN 19 09/02/2018   CREATININE 0.88 09/02/2018   CALCIUM 9.3 09/02/2018   PROT 7.0 09/02/2018   ALBUMIN 4.0 09/02/2018   AST 26 09/02/2018   ALT 16 09/02/2018   ALKPHOS 91 09/02/2018   BILITOT 0.5 09/02/2018   GFRNONAA >60 09/02/2018   GFRAA >60 09/02/2018    Lab Results  Component Value Date   WBC 7.7 09/02/2018   NEUTROABS 5.8 09/02/2018   HGB 15.9 (H) 09/02/2018   HCT 49.6 (H) 09/02/2018   MCV 86.1 09/02/2018   PLT 282 09/02/2018     STUDIES: Ct Abdomen Pelvis W Contrast  Result Date: 08/30/2018 CLINICAL DATA:  Diarrhea. History of E coli in C  difficile infection. History of colon cancer as well as squamous cell carcinoma of the head and neck. EXAM: CT ABDOMEN AND PELVIS WITH CONTRAST TECHNIQUE: Multidetector CT imaging of the abdomen and pelvis was performed using the standard protocol following bolus administration of intravenous contrast. CONTRAST:  57m OMNIPAQUE IOHEXOL 300 MG/ML  SOLN COMPARISON:  PET-CT-11/02/2017 FINDINGS: Lower chest: Limited visualization of the lower thorax demonstrates advanced paraseptal emphysematous change. Subsegmental atelectasis demonstrated within the imaged bilateral lower lobes. No pleural effusion. No focal airspace opacities. Normal heart size.  Small pericardial effusion, unchanged. Hepatobiliary: Normal hepatic contour. No discrete hepatic lesions. Post cholecystectomy. No radiopaque gallstones. No intra or extrahepatic biliary ductal dilatation. No ascites. Pancreas: Normal appearance of the pancreas. Spleen: Normal appearance of the spleen. Adrenals/Urinary Tract: There is symmetric enhancement and excretion of the bilateral kidneys. No definite renal stones on this postcontrast examination. No discrete renal lesions. No urine obstruction or perinephric stranding. Normal appearance of the bilateral adrenal glands. The urinary bladder is underdistended. Stomach/Bowel: Gastrostomy tube balloon is inflated within the gastric antrum. Bowel is normal in course and caliber without evidence of obstruction. No discrete areas of bowel wall thickening. Normal appearance of the terminal ileum. The appendix is not visualized, however there is no pericecal inflammatory change. No pneumoperitoneum, pneumatosis or portal venous gas. Vascular/Lymphatic: Large amount of irregular calcified and noncalcified atherosclerotic plaque throughout a normal caliber abdominal aorta. The major branch vessels of the abdominal aorta appear patent on this non CTA examination however there is significant mixed calcified and noncalcified  atherosclerotic plaque involving the origin the celiac artery as well as origin proximal and main trunk of the SMA. The IMA appears patent. Extensive atherosclerotic plaque involving the bilateral common iliac arteries (representative images 46 and 51, series 2) with suspected tandem areas of hemodynamically significant narrowing involving the bilateral external iliac arteries. No bulky retroperitoneal, mesenteric, pelvic or inguinal lymphadenopathy. Reproductive: Post hysterectomy. No discrete adnexal lesion. No free fluid in the pelvic cul-de-sac. Other: Regional soft tissues appear normal. Musculoskeletal: No acute or aggressive osseous abnormalities. Mild degenerative change of the bilateral hips with joint space loss, subchondral sclerosis and osteophytosis. IMPRESSION: 1. No definite explanation for patient's diarrhea. Specifically, no evidence of enteric obstruction or discrete area of bowel wall thickening. 2. Gastrostomy retention balloon located within the gastric antrum. 3. Large amount of atherosclerotic  plaque within normal caliber abdominal aorta. Aortic Atherosclerosis (ICD10-I70.0). 4. Suspected hemodynamically significant stenoses involving the bilateral common and external iliac arteries. Correlation for symptoms of lower extremity PAD is advised. 5.  Emphysema (ICD10-J43.9). Electronically Signed   By: Sandi Mariscal M.D.   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  PLAN:    1.  Recurrent, progressive stage IVa head and neck squamous cell carcinoma: Despite surgery at [facility] for residual disease, patient continues to have progressive malignancy.  Her performance status is significantly decreased.  Previously we discussed at length the possibility of enrolling in hospice, but patient ultimately decided she would like to try immunotherapy as one last effort for control of disease.  Patient will receive  nivolumab every 2 weeks until intolerable side effects or progression of disease.  She expressed understanding that this is likely not curative.  Despite diarrhea, will proceed with cycle 11 of nivolumab today.  Return to clinic in 2 weeks for further evaluation and consideration of cycle 12.  Will get reimaging with PET scan prior to cycle 12.   2.  Diarrhea: Possibly secondary to nivolumab.  Continue Imodium and Lomotil as needed.  PET scan as above.  Can also consider Remicade if necessary. 3.  Decreased performance status: Likely multifactorial.  Chronic and unchanged.  Continue home health and physical therapy. Patient has declined hospice as above.  Continue follow-up with palliative care as indicated. 4.  Nausea: Patient does not complain of this today.  Continue current medications as prescribed. 5.  Pain: Chronic and unchanged.  Continue current narcotic regimen. 6.  Hallucinations: Patient does not complain of this today.  Unclear etiology, but likely medication induced.  Monitor. 7.  PEG tube: Appreciate surgery and dietary input.  I spent a total of 30 minutes face-to-face with the patient of which greater than 50% of the visit was spent in counseling and coordination of care as detailed above.  Patient and her husband expressed understanding that they can call or return to clinic at any time if they have any questions, concerns, or complaints.  [name], MD 09/03/18 6:50 AM

## 2018-08-30 ENCOUNTER — Other Ambulatory Visit: Payer: Self-pay | Admitting: Oncology

## 2018-08-30 ENCOUNTER — Ambulatory Visit: Admission: RE | Admit: 2018-08-30 | Payer: PPO | Source: Ambulatory Visit

## 2018-08-30 ENCOUNTER — Ambulatory Visit
Admission: RE | Admit: 2018-08-30 | Discharge: 2018-08-30 | Disposition: A | Discharge status: Home / Self Care | Payer: PPO | Source: Ambulatory Visit | Attending: Oncology | Admitting: Oncology

## 2018-08-30 DIAGNOSIS — R197 Diarrhea, unspecified: Secondary | ICD-10-CM | POA: Diagnosis not present

## 2018-08-30 DIAGNOSIS — I700 Atherosclerosis of aorta: Secondary | ICD-10-CM | POA: Diagnosis not present

## 2018-08-30 MED ORDER — IOHEXOL 300 MG/ML  SOLN
75.0000 mL | Freq: Once | INTRAMUSCULAR | Status: AC | PRN
  Administered 2018-08-30: 75 mL via INTRAVENOUS

## 2018-08-30 MED ORDER — CHOLESTYRAMINE 4 GM/DOSE PO POWD: 4.0000 g | Freq: Three times a day (TID) | ORAL | 12 refills | Status: AC

## 2018-08-30 NOTE — Progress Notes (Signed)
.   I will talk to [facility] about her current tube feeds. See if there are any additional options.

## 2018-08-30 NOTE — Progress Notes (Signed)
FYI. Nothing to explain the diarrhea.

## 2018-09-02 ENCOUNTER — Inpatient Hospital Stay (HOSPITAL_BASED_OUTPATIENT_CLINIC_OR_DEPARTMENT_OTHER): Payer: PPO | Admitting: Oncology

## 2018-09-02 ENCOUNTER — Inpatient Hospital Stay: Payer: PPO

## 2018-09-02 ENCOUNTER — Other Ambulatory Visit: Payer: Self-pay

## 2018-09-02 VITALS — BP 106/74 | HR 120 | Temp 97.3°F | Wt 102.8 lb

## 2018-09-02 VITALS — BP 112/77 | HR 81 | Resp 20

## 2018-09-02 DIAGNOSIS — Z87891 Personal history of nicotine dependence: Secondary | ICD-10-CM

## 2018-09-02 DIAGNOSIS — Z85038 Personal history of other malignant neoplasm of large intestine: Secondary | ICD-10-CM | POA: Diagnosis not present

## 2018-09-02 DIAGNOSIS — C760 Malignant neoplasm of head, face and neck: Secondary | ICD-10-CM

## 2018-09-02 DIAGNOSIS — G8929 Other chronic pain: Secondary | ICD-10-CM | POA: Diagnosis not present

## 2018-09-02 DIAGNOSIS — R197 Diarrhea, unspecified: Secondary | ICD-10-CM

## 2018-09-02 DIAGNOSIS — Z931 Gastrostomy status: Secondary | ICD-10-CM | POA: Diagnosis not present

## 2018-09-02 DIAGNOSIS — Z5112 Encounter for antineoplastic immunotherapy: Secondary | ICD-10-CM | POA: Diagnosis not present

## 2018-09-02 LAB — COMPREHENSIVE METABOLIC PANEL
ALK PHOS: 91 U/L (ref 38–126)
ALT: 16 U/L (ref 0–44)
AST: 26 U/L (ref 15–41)
Albumin: 4 g/dL (ref 3.5–5.0)
Anion gap: 11 (ref 5–15)
BILIRUBIN TOTAL: 0.5 mg/dL (ref 0.3–1.2)
BUN: 19 mg/dL (ref 8–23)
CO2: 25 mmol/L (ref 22–32)
Calcium: 9.3 mg/dL (ref 8.9–10.3)
Chloride: 105 mmol/L (ref 98–111)
Creatinine, Ser: 0.88 mg/dL (ref 0.44–1.00)
GFR calc Af Amer: >60 mL/min (ref >60)
GFR calc non Af Amer: >60 mL/min (ref >60)
GLUCOSE: 98 mg/dL (ref 70–99)
Potassium: 4.1 mmol/L (ref 3.5–5.1)
SODIUM: 141 mmol/L (ref 135–145)
TOTAL PROTEIN: 7 g/dL (ref 6.5–8.1)

## 2018-09-02 LAB — CBC WITH DIFFERENTIAL/PLATELET
ABS IMMATURE GRANULOCYTES: 0.03 10*3/uL (ref 0.00–0.07)
Basophils Absolute: 0 10*3/uL (ref 0.0–0.1)
Basophils Relative: 0 %
Eosinophils Absolute: 0.2 10*3/uL (ref 0.0–0.5)
Eosinophils Relative: 3 %
HCT: 49.6 % — ABNORMAL HIGH (ref 36.0–46.0)
Hemoglobin: 15.9 g/dL — ABNORMAL HIGH (ref 12.0–15.0)
Immature Granulocytes: 0 %
Lymphocytes Relative: 17 %
Lymphs Abs: 1.3 10*3/uL (ref 0.7–4.0)
MCH: 27.6 pg (ref 26.0–34.0)
MCHC: 32.1 g/dL (ref 30.0–36.0)
MCV: 86.1 fL (ref 80.0–100.0)
Monocytes Absolute: 0.4 10*3/uL (ref 0.1–1.0)
Monocytes Relative: 5 %
NEUTROS ABS: 5.8 10*3/uL (ref 1.7–7.7)
Neutrophils Relative %: 75 %
Platelets: 282 10*3/uL (ref 150–400)
RBC: 5.76 MIL/uL — ABNORMAL HIGH (ref 3.87–5.11)
RDW: 14.7 % (ref 11.5–15.5)
WBC: 7.7 10*3/uL (ref 4.0–10.5)
nRBC: 0 % (ref 0.0–0.2)

## 2018-09-02 LAB — MAGNESIUM: Magnesium: 2.1 mg/dL (ref 1.7–2.4)

## 2018-09-02 MED ORDER — SODIUM CHLORIDE 0.9 % IV SOLN
Freq: Once | INTRAVENOUS | Status: AC
  Administered 2018-09-02: 11:00:00 via INTRAVENOUS
  Filled 2018-09-02: qty 250

## 2018-09-02 MED ORDER — SODIUM CHLORIDE 0.9 % IV SOLN
240.0000 mg | Freq: Once | INTRAVENOUS | Status: AC
  Administered 2018-09-02: 240 mg via INTRAVENOUS
  Filled 2018-09-02: qty 24

## 2018-09-02 NOTE — Progress Notes (Signed)
Patient is here today to follow up on her Squamous cell carcinoma of head and neck. Patient's husband stated that her diarrhea is not any better. Patient was here on 08/27/2018 and saw Faythe Casa, NP and she had given her Lucrezia Starch but were not able to get since the pharmacy needed to speak with the provider.

## 2018-09-02 NOTE — Progress Notes (Signed)
Nutrition Follow-up:  Patient with head and neck cancer stage IV and adenocarcinoma of the colon.  Patient followed by Dr. Grayland Ormond G-tube placed on 6/7 at [facility]  Patient receiving nivolumab.   Met with patient and husband during infusion today. Noted issues with diarrhea and being seen in [facility] on 1/10.  Husband reports that he has only been giving 1 carton of tube feeding vs 4 due to diarrhea being so bad (7-8 times per day with full tube feeding dose).  Has had issues with getting questran from pharmacy and planning to pick it up today.   Reviewed with husband that patient was initially placed on osmolite tube feeding after placement on 6/7.  Reports she did not tolerate it with increased nausea and vomiting.  Home health switched to Boost for short time which she did not tolerate then ensure plus.  Patient has been on ensure plus, husband reports since May but tube was placed in June.    Medications: reviewed  Labs: reviewed  Anthropometrics:   Weight decreased to 102 lb 12.8 oz from 108 lb 4.8 oz (likely from diarrhea and cutting back on tube feeding)   Estimated Energy Needs  Kcals: 1250-1500 calories Protein: 62-75 g Fluid: 1.5 L/d  NUTRITION DIAGNOSIS: Inadequate oral intake continues but receiving nutrition from PEG   INTERVENTION:  Discussed plan of care with Dr. Grayland Ormond today.  MD planning PET scan in 2 weeks. RD can consider trying higher fiber formula (ensure plus and jevity 1.5) or nutrisource fiber to see if it will help with diarrhea.  MD wants to hold off on making changes to tube feeding at this time and try other interventions.   Strongly encouraged husband to not hold tube feeding.      MONITORING, EVALUATION, GOAL: weight trends, intake, TF tolerance   NEXT VISIT: Jan 30 during infusion  Aubria Vanecek B. Zenia Resides, [place], [place] Registered Dietitian [phone] (pager)

## 2018-09-03 DIAGNOSIS — K9420 Gastrostomy complication, unspecified: Secondary | ICD-10-CM | POA: Insufficient documentation

## 2018-09-03 LAB — THYROID PANEL WITH TSH
Free Thyroxine Index: 2.3 (ref 1.2–4.9)
T3 Uptake Ratio: 26 % (ref 24–39)
T4, Total: 8.7 ug/dL (ref 4.5–12.0)
TSH: 1.65 u[IU]/mL (ref 0.450–4.500)

## 2018-09-09 DIAGNOSIS — C760 Malignant neoplasm of head, face and neck: Secondary | ICD-10-CM | POA: Diagnosis not present

## 2018-09-10 ENCOUNTER — Telehealth: Payer: Self-pay | Admitting: *Deleted

## 2018-09-10 NOTE — Telephone Encounter (Signed)
Mr [facility] informed f physician response. He reports that he stopped the medicine this morning

## 2018-09-10 NOTE — Telephone Encounter (Signed)
Hold meds, although I'm hesitant to give something in fear of recurrent diarrhea.

## 2018-09-10 NOTE — Telephone Encounter (Signed)
Husband called reporting that the new medicine given for diarrhea has worked too well and now she is constipated and has not had bowel movement in 2 days and her abdomen is hurting. He is asking what he can do for her.PLease advise

## 2018-09-12 NOTE — Progress Notes (Signed)
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  INTERVAL HISTORY: Patient returns to clinic today for further evaluation, discussion of her imaging results, and consideration of cycle 12 of palliative nivolumab.  She continues to have a decreased performance status.  Her diarrhea has resolved.  She does not complain of pain today.  She has gained weight in the interim.  She continues to have difficulty eating and uses her PEG tube for 100% of her nutrition.  She has no neurologic complaints.  She denies any recent fevers. She has no chest pain or shortness of breath. She has no urinary complaints.  Patient offers no further specific complaints today.  REVIEW OF SYSTEMS:   Review of Systems  Constitutional: Positive for malaise/fatigue. Negative for fever and weight loss.  HENT: Negative.  Negative for sore throat.   Respiratory: Negative.  Negative for cough, shortness of breath and stridor.   Cardiovascular: Negative.  Negative for chest pain and leg swelling.  Gastrointestinal: Negative.  Negative for abdominal pain, constipation, diarrhea, nausea and vomiting.  Genitourinary: Negative.  Negative for dysuria.  Musculoskeletal: Negative.  Negative for falls and joint pain.  Skin: Negative.  Negative for itching and rash.  Neurological: Positive for weakness. Negative for sensory change, focal weakness and headaches.  Psychiatric/Behavioral: Positive for depression and memory loss. Negative for hallucinations. The patient is not nervous/anxious and does not have insomnia.     As per HPI. Otherwise, a complete review of systems is  negative.  PAST MEDICAL HISTORY: Past Medical History:  Diagnosis Date  . Adenocarcinoma of colon ([place])   . Benign neoplasm of ascending colon   . Essential hypertension 06/13/2014  . Goals of care, counseling/discussion 07/14/2017  . HLD (hyperlipidemia) 06/13/2014  . Hypercholesteremia   . Hyperlipidemia   . Multiple thyroid nodules 01/22/2012  . Osteoporosis   . Polycythemia, secondary 12/26/2014  . Pure hypercholesterolemia 03/15/2015  . Squamous cell carcinoma of head and neck ([place])   . Thyroid nodule     PAST SURGICAL HISTORY: Past Surgical History:  Procedure Laterality Date  . ABDOMINAL HYSTERECTOMY    . COLONOSCOPY WITH PROPOFOL N/A 07/02/2017   Procedure: COLONOSCOPY WITH PROPOFOL;  Surgeon: Lucilla Lame, MD;  Location: [facility];  Service: Endoscopy;  Laterality: N/A;  . ORIF ANKLE FRACTURE Left 08/08/2017   Procedure: OPEN REDUCTION INTERNAL FIXATION (ORIF) ANKLE FRACTURE;  Surgeon: Dereck Leep, MD;  Location: [facility];  Service: Orthopedics;  Laterality: Left;  . POLYPECTOMY N/A 07/02/2017   Procedure: POLYPECTOMY;  Surgeon: Lucilla Lame, MD;  Location: [facility];  Service: Endoscopy;  Laterality: N/A;  . WRIST FRACTURE SURGERY  08/2009   badly broken    FAMILY HISTORY Family History  Problem Relation Age of Onset  . Heart disease Mother   . Diabetes Father        ADVANCED DIRECTIVES:    HEALTH MAINTENANCE: Social History   Tobacco Use  . Smoking status: Former Smoker    Packs/day: 0.25    Types: Cigarettes    Last attempt to quit: 06/17/2017    Years since quitting: 1.2  . Smokeless tobacco: Never Used  Substance Use Topics  . Alcohol use: No  .  Drug use: No     Allergies  Allergen Reactions  . Potassium Nausea Only and Nausea And Vomiting  . Other Other (See Comments) and Rash    TDAP tdap TDAP  . Tetanus Antitoxin Rash    Current Outpatient Medications  Medication Sig Dispense Refill  . calcium carbonate  (OSCAL) 1500 (600 Ca) MG TABS tablet Take 600 mg of elemental calcium by mouth daily with breakfast.    . cholestyramine (QUESTRAN) 4 GM/DOSE powder Take 1 packet (4 g total) by mouth 3 (three) times daily with meals. 378 g 12  . ENSURE (ENSURE) Take 237 mLs by mouth.    . fentaNYL (DURAGESIC - DOSED MCG/HR) 50 MCG/HR Place 50 mcg onto the skin every 3 (three) days.     Marland Kitchen ibuprofen (ADVIL,MOTRIN) 200 MG tablet Take 400 mg by mouth every 4 (four) hours as needed.    . loperamide (IMODIUM A-D) 2 MG tablet Take 1 tablet (2 mg total) by mouth 4 (four) times daily as needed for diarrhea or loose stools. 30 tablet 2  . morphine (ROXANOL) 20 MG/ML concentrated solution Place 0.25 mLs (5 mg total) into feeding tube every 4 (four) hours as needed for moderate pain or severe pain. 30 mL 0  . NARCAN 4 MG/0.1ML LIQD nasal spray kit USE 1 SPRAY PER INTRANASAL ROUTE AS DIRECTED  0  . nystatin (MYCOSTATIN/NYSTOP) powder Apply topically 4 (four) times daily. Under breasts 15 g 0  . pravastatin (PRAVACHOL) 10 MG tablet Take 10 mg by mouth daily.    . predniSONE (STERAPRED UNI-PAK 21 TAB) 10 MG (21) TBPK tablet Take as directed 21 tablet 0  . promethazine (PHENERGAN) 25 MG tablet Take 25 mg by mouth every 6 (six) hours as needed for nausea or vomiting.     No current facility-administered medications for this visit.    Facility-Administered Medications Ordered in Other Visits  Medication Dose Route Frequency Provider Last Rate Last Dose  . heparin lock flush 100 unit/mL  250 Units Intracatheter PRN Leia Alf, MD      . heparin lock flush 100 unit/mL  500 Units Intracatheter PRN Leia Alf, MD      . potassium chloride 20 mEq in sodium chloride 0.9 % 500 mL injection   Intravenous Once Tye Savoy, Doni H, RN      . sodium chloride 0.9 % injection 10 mL  10 mL Intracatheter PRN Leia Alf, MD      . sodium chloride 0.9 % injection 10 mL  10 mL Intracatheter PRN Ma Hillock, Sandeep, MD      . sodium  chloride 0.9 % injection 10 mL  10 mL Intracatheter PRN Ma Hillock, Sandeep, MD      . sodium chloride 0.9 % injection 10 mL  10 mL Intracatheter PRN Leia Alf, MD      -year-old  OBJECTIVE: Vitals:   09/16/18 0927  BP: 97/65  Pulse: (!) 114  Temp: (!) 97.5 F (36.4 C)     Body mass index is 17.35 kg/m.    ECOG FS:3 - Symptomatic, >50% confined to bed  General: Well-developed, well-nourished, no acute distress.  Sitting in a wheelchair. Eyes: Pink conjunctiva, anicteric sclera. HEENT: Normocephalic, moist mucous membranes, clear oropharnyx.  Mass on neck significantly improved. Lungs: Clear to auscultation bilaterally. Heart: Regular rate and rhythm. No rubs, murmurs, or gallops. Abdomen: Soft, nontender, nondistended. No organomegaly noted, normoactive bowel sounds. Musculoskeletal: No edema, cyanosis, or clubbing. Neuro: Alert, answering all questions appropriately. Cranial nerves grossly intact. Skin: No  rashes or petechiae noted. Psych: Normal affect.  LAB RESULTS:  Lab Results  Component Value Date   NA 136 09/16/2018   K 3.9 09/16/2018   CL 100 09/16/2018   CO2 28 09/16/2018   GLUCOSE 126 (H) 09/16/2018   BUN 19 09/16/2018   CREATININE 0.95 09/16/2018   CALCIUM 9.0 09/16/2018   PROT 7.0 09/16/2018   ALBUMIN 3.6 09/16/2018   AST 28 09/16/2018   ALT 28 09/16/2018   ALKPHOS 112 09/16/2018   BILITOT 0.4 09/16/2018   GFRNONAA 59 (L) 09/16/2018   GFRAA >60 09/16/2018    Lab Results  Component Value Date   WBC 6.1 09/16/2018   NEUTROABS 4.8 09/16/2018   HGB 15.1 (H) 09/16/2018   HCT 47.8 (H) 09/16/2018   MCV 87.7 09/16/2018   PLT 288 09/16/2018     STUDIES: Ct Abdomen Pelvis W Contrast  Result Date: 08/30/2018 CLINICAL DATA:  Diarrhea. History of E coli in C difficile infection. History of colon cancer as well as squamous cell carcinoma of the head and neck. EXAM: CT ABDOMEN AND PELVIS WITH CONTRAST TECHNIQUE: Multidetector CT imaging of the abdomen and  pelvis was performed using the standard protocol following bolus administration of intravenous contrast. CONTRAST:  70m OMNIPAQUE IOHEXOL 300 MG/ML  SOLN COMPARISON:  PET-CT-11/02/2017 FINDINGS: Lower chest: Limited visualization of the lower thorax demonstrates advanced paraseptal emphysematous change. Subsegmental atelectasis demonstrated within the imaged bilateral lower lobes. No pleural effusion. No focal airspace opacities. Normal heart size.  Small pericardial effusion, unchanged. Hepatobiliary: Normal hepatic contour. No discrete hepatic lesions. Post cholecystectomy. No radiopaque gallstones. No intra or extrahepatic biliary ductal dilatation. No ascites. Pancreas: Normal appearance of the pancreas. Spleen: Normal appearance of the spleen. Adrenals/Urinary Tract: There is symmetric enhancement and excretion of the bilateral kidneys. No definite renal stones on this postcontrast examination. No discrete renal lesions. No urine obstruction or perinephric stranding. Normal appearance of the bilateral adrenal glands. The urinary bladder is underdistended. Stomach/Bowel: Gastrostomy tube balloon is inflated within the gastric antrum. Bowel is normal in course and caliber without evidence of obstruction. No discrete areas of bowel wall thickening. Normal appearance of the terminal ileum. The appendix is not visualized, however there is no pericecal inflammatory change. No pneumoperitoneum, pneumatosis or portal venous gas. Vascular/Lymphatic: Large amount of irregular calcified and noncalcified atherosclerotic plaque throughout a normal caliber abdominal aorta. The major branch vessels of the abdominal aorta appear patent on this non CTA examination however there is significant mixed calcified and noncalcified atherosclerotic plaque involving the origin the celiac artery as well as origin proximal and main trunk of the SMA. The IMA appears patent. Extensive atherosclerotic plaque involving the bilateral common  iliac arteries (representative images 46 and 51, series 2) with suspected tandem areas of hemodynamically significant narrowing involving the bilateral external iliac arteries. No bulky retroperitoneal, mesenteric, pelvic or inguinal lymphadenopathy. Reproductive: Post hysterectomy. No discrete adnexal lesion. No free fluid in the pelvic cul-de-sac. Other: Regional soft tissues appear normal. Musculoskeletal: No acute or aggressive osseous abnormalities. Mild degenerative change of the bilateral hips with joint space loss, subchondral sclerosis and osteophytosis. IMPRESSION: 1. No definite explanation for patient's diarrhea. Specifically, no evidence of enteric obstruction or discrete area of bowel wall thickening. 2. Gastrostomy retention balloon located within the gastric antrum. 3. Large amount of atherosclerotic plaque within normal caliber abdominal aorta. Aortic Atherosclerosis (ICD10-I70.0). 4. Suspected hemodynamically significant stenoses involving the bilateral common and external iliac arteries. Correlation for symptoms of lower extremity PAD is advised. 5.  Emphysema (ICD10-J43.9). Electronically Signed   By: Sandi Mariscal M.D.   On: 08/30/2018 14:22   Nm Pet Image Restag (ps) Skull Base To Thigh  Result Date: 09/13/2018 CLINICAL DATA:  Subsequent treatment strategy for head and neck squamous cell carcinoma. EXAM: NUCLEAR MEDICINE PET SKULL BASE TO THIGH TECHNIQUE: 5.75 mCi F-18 FDG was injected intravenously. Full-ring PET imaging was performed from the skull base to thigh after the radiotracer. CT data was obtained and used for attenuation correction and anatomic localization. Fasting blood glucose: 80 mg/dl COMPARISON:  PET-CT 3189 FINDINGS: Mediastinal blood pool activity: SUV max 1.8 NECK: No hypermetabolic lymph nodes in the neck. Postsurgical change in the RIGHT neck without evidence of hypermetabolic activity suggests residual local disease. Incidental CT findings: none CHEST: Elongated nodule  in the RIGHT upper lobe measures 13 mm and has mild metabolic activity SUV max equal 2.0. This nodule is new from comparison exam. No hypermetabolic mediastinal lymph nodes. Second nodule within the periphery of the RIGHT lower lobe measures 5 mm (image 144/3) and has very low metabolic activity with SUV max equal 1.0 which is less than blood pool activity. Extensive centrilobular emphysema the upper lobes. Incidental CT findings: none ABDOMEN/PELVIS: No abnormal hypermetabolic activity within the liver, pancreas, adrenal glands, or spleen. No hypermetabolic lymph nodes in the abdomen or pelvis. Metabolic activity associated with the PEG tube tract is typical benign finding. Incidental CT findings: Atherosclerotic calcification of the aorta. SKELETON: No focal hypermetabolic activity to suggest skeletal metastasis. Incidental CT findings: none IMPRESSION: 1. Mild metabolic activity associated with small irregular nodule in the RIGHT upper lobe which is new from comparison exams. Findings are concerning for bronchogenic carcinoma versus metastatic carcinoma versus inflammatory nodule. Favor bronchogenic carcinoma. Consider short-term (1-3 months) CT follow-up versus tissue sampling. 2. Smaller nodule within the RIGHT lower lobe is indeterminate. 3. Postsurgical change in the RIGHT neck without metabolic activity suggest residual local disease. 4. No mediastinal metastatic adenopathy. 5. Severe centrilobular emphysema the upper lobes. Electronically Signed   By: Suzy Bouchard M.D.   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  PLAN:    1.  Recurrent, progressive stage IVa head and neck squamous cell carcinoma: PET scan results from September 13, 2018 reviewed independently and report as above with significant improvement and near resolution of her malignancy.  Her performance status remains decreased, but she is  symptomatically improved as well. Previously we discussed at length the possibility of enrolling in hospice, but patient ultimately decided she would like to try immunotherapy as one last effort for control of disease.  Patient will receive nivolumab every 2 weeks until intolerable side effects or progression of disease.  She expressed understanding that this is likely not curative.  Proceed with cycle 12 of nivolumab today.  Return to clinic in 2 weeks for further evaluation and consideration of cycle 13.  Will reimage with CT scan in approximately April 2020.   2.  Diarrhea: Resolved.  Continue cholestyramine, Imodium, and Lomotil as needed.   3.  Decreased performance status: Likely multifactorial.  Chronic and unchanged.  Have recommended reinitiating physical therapy. Patient has declined hospice as above.  Continue follow-up with palliative care as indicated. 4.  Nausea: Patient does not complain of this today.  Continue current medications as prescribed. 5.  Pain: Appears well controlled with current narcotic regimen. 6.  Hallucinations: Patient does not complain of this today.  Unclear  etiology, but likely medication induced.  Monitor. 7.  PEG tube: Appreciate surgery and dietary input.  Patient and her husband expressed understanding that they can call or return to clinic at any time if they have any questions, concerns, or complaints.  [name], MD 09/17/18 6:50 AM

## 2018-09-13 ENCOUNTER — Ambulatory Visit
Admission: RE | Admit: 2018-09-13 | Discharge: 2018-09-13 | Disposition: A | Discharge status: Home / Self Care | Payer: PPO | Source: Ambulatory Visit | Attending: Oncology | Admitting: Oncology

## 2018-09-13 DIAGNOSIS — J432 Centrilobular emphysema: Secondary | ICD-10-CM | POA: Insufficient documentation

## 2018-09-13 DIAGNOSIS — C4442 Squamous cell carcinoma of skin of scalp and neck: Secondary | ICD-10-CM | POA: Diagnosis not present

## 2018-09-13 DIAGNOSIS — C760 Malignant neoplasm of head, face and neck: Secondary | ICD-10-CM | POA: Diagnosis not present

## 2018-09-13 DIAGNOSIS — R918 Other nonspecific abnormal finding of lung field: Secondary | ICD-10-CM | POA: Diagnosis not present

## 2018-09-13 LAB — GLUCOSE, CAPILLARY: Glucose-Capillary: 80 mg/dL (ref 70–99)

## 2018-09-13 MED ORDER — FLUDEOXYGLUCOSE F - 18 (FDG) INJECTION
5.7500 | Freq: Once | INTRAVENOUS | Status: AC | PRN
  Administered 2018-09-13: 5.75 via INTRAVENOUS

## 2018-09-16 ENCOUNTER — Encounter: Payer: PPO | Admitting: Hospice and Palliative Medicine

## 2018-09-16 ENCOUNTER — Inpatient Hospital Stay: Payer: PPO

## 2018-09-16 ENCOUNTER — Other Ambulatory Visit: Payer: Self-pay

## 2018-09-16 ENCOUNTER — Ambulatory Visit
Admission: RE | Admit: 2018-09-16 | Discharge: 2018-09-16 | Disposition: A | Discharge status: Home / Self Care | Payer: PPO | Source: Ambulatory Visit | Attending: Radiation Oncology | Admitting: Radiation Oncology

## 2018-09-16 ENCOUNTER — Inpatient Hospital Stay (HOSPITAL_BASED_OUTPATIENT_CLINIC_OR_DEPARTMENT_OTHER): Payer: PPO | Admitting: Oncology

## 2018-09-16 VITALS — BP 97/65 | HR 114 | Temp 97.5°F | Wt 107.5 lb

## 2018-09-16 VITALS — BP 126/74 | HR 95 | Resp 20

## 2018-09-16 DIAGNOSIS — C760 Malignant neoplasm of head, face and neck: Secondary | ICD-10-CM

## 2018-09-16 DIAGNOSIS — Z931 Gastrostomy status: Secondary | ICD-10-CM | POA: Diagnosis not present

## 2018-09-16 DIAGNOSIS — R918 Other nonspecific abnormal finding of lung field: Secondary | ICD-10-CM | POA: Insufficient documentation

## 2018-09-16 DIAGNOSIS — R197 Diarrhea, unspecified: Secondary | ICD-10-CM | POA: Diagnosis not present

## 2018-09-16 DIAGNOSIS — Z923 Personal history of irradiation: Secondary | ICD-10-CM | POA: Insufficient documentation

## 2018-09-16 DIAGNOSIS — Z87891 Personal history of nicotine dependence: Secondary | ICD-10-CM | POA: Diagnosis not present

## 2018-09-16 DIAGNOSIS — Z9221 Personal history of antineoplastic chemotherapy: Secondary | ICD-10-CM | POA: Diagnosis not present

## 2018-09-16 DIAGNOSIS — F17210 Nicotine dependence, cigarettes, uncomplicated: Secondary | ICD-10-CM | POA: Diagnosis not present

## 2018-09-16 DIAGNOSIS — Z5112 Encounter for antineoplastic immunotherapy: Secondary | ICD-10-CM | POA: Diagnosis not present

## 2018-09-16 LAB — COMPREHENSIVE METABOLIC PANEL
ALT: 28 U/L (ref 0–44)
AST: 28 U/L (ref 15–41)
Albumin: 3.6 g/dL (ref 3.5–5.0)
Alkaline Phosphatase: 112 U/L (ref 38–126)
Anion gap: 8 (ref 5–15)
BUN: 19 mg/dL (ref 8–23)
CALCIUM: 9 mg/dL (ref 8.9–10.3)
CO2: 28 mmol/L (ref 22–32)
CREATININE: 0.95 mg/dL (ref 0.44–1.00)
Chloride: 100 mmol/L (ref 98–111)
GFR calc Af Amer: >60 mL/min (ref >60)
GFR calc non Af Amer: 59 mL/min — ABNORMAL LOW (ref >60)
Glucose, Bld: 126 mg/dL — ABNORMAL HIGH (ref 70–99)
Potassium: 3.9 mmol/L (ref 3.5–5.1)
Sodium: 136 mmol/L (ref 135–145)
Total Bilirubin: 0.4 mg/dL (ref 0.3–1.2)
Total Protein: 7 g/dL (ref 6.5–8.1)

## 2018-09-16 LAB — CBC WITH DIFFERENTIAL/PLATELET
Abs Immature Granulocytes: 0.02 10*3/uL (ref 0.00–0.07)
Basophils Absolute: 0 10*3/uL (ref 0.0–0.1)
Basophils Relative: 0 %
EOS ABS: 0.2 10*3/uL (ref 0.0–0.5)
Eosinophils Relative: 3 %
HEMATOCRIT: 47.8 % — AB (ref 36.0–46.0)
Hemoglobin: 15.1 g/dL — ABNORMAL HIGH (ref 12.0–15.0)
Immature Granulocytes: 0 %
Lymphocytes Relative: 14 %
Lymphs Abs: 0.9 10*3/uL (ref 0.7–4.0)
MCH: 27.7 pg (ref 26.0–34.0)
MCHC: 31.6 g/dL (ref 30.0–36.0)
MCV: 87.7 fL (ref 80.0–100.0)
Monocytes Absolute: 0.3 10*3/uL (ref 0.1–1.0)
Monocytes Relative: 4 %
Neutro Abs: 4.8 10*3/uL (ref 1.7–7.7)
Neutrophils Relative %: 79 %
Platelets: 288 10*3/uL (ref 150–400)
RBC: 5.45 MIL/uL — ABNORMAL HIGH (ref 3.87–5.11)
RDW: 15.2 % (ref 11.5–15.5)
WBC: 6.1 10*3/uL (ref 4.0–10.5)
nRBC: 0 % (ref 0.0–0.2)

## 2018-09-16 LAB — MAGNESIUM: Magnesium: 2.1 mg/dL (ref 1.7–2.4)

## 2018-09-16 MED ORDER — SODIUM CHLORIDE 0.9 % IV SOLN
Freq: Once | INTRAVENOUS | Status: AC
  Administered 2018-09-16: 10:00:00 via INTRAVENOUS
  Filled 2018-09-16: qty 250

## 2018-09-16 MED ORDER — SODIUM CHLORIDE 0.9 % IV SOLN
240.0000 mg | Freq: Once | INTRAVENOUS | Status: AC
  Administered 2018-09-16: 240 mg via INTRAVENOUS
  Filled 2018-09-16: qty 24

## 2018-09-16 NOTE — Progress Notes (Signed)
Radiation Oncology Follow up Note  Name: Dana Perez   Date:   09/16/2018 MRN:  [id number] DOB: February 10, 1941    This 78 y.o. female presents to the clinic today for 11 month follow-up status post initial concurrent chemoradiation therapyfor locally advanced squamous cell carcinoma presenting as a large right sub-mandibular mass status post salvage surgery now currently on immunotherapy.  REFERRING PROVIDER: Glendon Axe, MD  HPI: patient is a 78 year old female now out 11 months having completed concurrent chemoradiation therapy for large locally advanced squamous cell carcinoma presenting as a large right submandibular mass. She is seen today in follow-up. She went on to have persistent disease by PET CT criteria underwent a right neck dissection with right segmental mandibulectomy radical resection of the right neck with residual tumor spanning approxi-4.5 cm with perineural invasion..she's been treated with palliative nivolumabwhich is tolerated fairly well although having some episodes of diarrhea. She's currently receiving her nutrition through her PEG tube solely.she recently had a PET CT scan which I have reviewed showing a right upper lobe lesion which is slightly hypermetabolic could be primary bronchogenic carcinoma versus metastatic disease. There is also a smaller nodule in the right lower lobe which is indeterminate. She has post surgical changes in the right neck without significant hypermetabolic activity suggesting diseases stable.  COMPLICATIONS OF TREATMENT: none  FOLLOW UP COMPLIANCE: keeps appointments   PHYSICAL EXAM:  There were no vitals taken for this visit. Frail-appearing female wheelchair-bound in NAD. She has marked trismus making oral exam difficult. No oral mucosal lesions are identified on cursory exam. Neck is clear without evidence of cervical or supra clavicular adenopathy.Well-developed well-nourished patient in NAD. HEENT reveals PERLA, EOMI, discs not  visualized.  Oral cavity is clear. No oral mucosal lesions are identified. Neck is clear without evidence of cervical or supraclavicular adenopathy. Lungs are clear to A&P. Cardiac examination is essentially unremarkable with regular rate and rhythm without murmur rub or thrill. Abdomen is benign with no organomegaly or masses noted. Motor sensory and DTR levels are equal and symmetric in the upper and lower extremities. Cranial nerves II through XII are grossly intact. Proprioception is intact. No peripheral adenopathy or edema is identified. No motor or sensory levels are noted. Crude visual fields are within normal range.  RADIOLOGY RESULTS: PET CT scan is reviewed and compatible with the above-stated findings  PLAN: present time patient is extremely frail continues to receive palliative nivolumab. At this time she'll have repeat CT scans for surveillance monitoring her both her head and neck in the pulmonary nodules. I have asked to see her back in 6 months for follow-up. I see no role for radiation at this time. We'll be happy to reevaluate her any time should further palliative treatment be indicated.  I would like to take this opportunity to thank you for allowing me to participate in the care of your patient.Noreene Filbert, MD

## 2018-09-16 NOTE — Progress Notes (Signed)
Patient is here today to follow up on her squamous cell carcinoma of head and neck. Patient stated that sometimes she has pain on her right side of her jaw at times. Patient's husband stated that patient is doing well now with her bowel movements with prune juice.

## 2018-09-16 NOTE — Progress Notes (Signed)
Nutrition Follow-up:  Patient with head and neck cancer stage IV and adenocarcinoma of the colon.  Patient followed by Dr. Grayland Ormond and receiving nivolumab.  [facility] 6/7 [facility].    Met with patient and husband during infusion today.  Husband reports Lucrezia Starch worked too good, had issues with constipation.  Reports gave prune juice via PEG and has helped with constipation.  Husband reports good bowel movement this am.  Having BM about every 2 days now.  Husband giving 4 cartons of ensure plus at this time and patient tolerating well.    Medications: reviewed  Labs: reviewed  Anthropometrics:   Weight increased to 107 lb 8 oz today increased from 102 lb 12.8 oz on 09/02/2017   Estimated Energy Needs  Kcals: 1250-1500 calories Protein: 62-75 g Fluid: 1.5 L/d  NUTRITION DIAGNOSIS: Inadequate oral  Intake continues but receiving nutrition via PEG   INTERVENTION:  Continue ensure plus at least 4 cartons per day.  If patient able to take 4 1/2 or 5 cartons encouraged husband to give it to increase calories and protein and weight.      MONITORING, EVALUATION, GOAL: weight trends, intake   NEXT VISIT: Feb 13 during infusion  Kenyetta Fife B. Zenia Resides, [place], [place] Registered Dietitian [phone] (pager)

## 2018-09-17 LAB — THYROID PANEL WITH TSH
FREE THYROXINE INDEX: 2 (ref 1.2–4.9)
T3 Uptake Ratio: 25 % (ref 24–39)
T4, Total: 7.9 ug/dL (ref 4.5–12.0)
TSH: 1.03 u[IU]/mL (ref 0.450–4.500)

## 2018-09-24 NOTE — Progress Notes (Signed)
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  INTERVAL HISTORY: Patient returns to clinic today for further evaluation and consideration of cycle 13 of palliative nivolumab.  She continues to have a decreased performance status, but otherwise feels well.  She has no further diarrhea. She does not complain of pain today.  She continues to gain weight.  She continues to have difficulty eating and uses her PEG tube for 100% of her nutrition.  She has no neurologic complaints.  She denies any recent fevers. She has no chest pain or shortness of breath. She has no urinary complaints.  Patient offers no further specific complaints today.  REVIEW OF SYSTEMS:   Review of Systems  Constitutional: Positive for malaise/fatigue. Negative for fever and weight loss.  HENT: Negative.  Negative for sore throat.   Respiratory: Negative.  Negative for cough, shortness of breath and stridor.   Cardiovascular: Negative.  Negative for chest pain and leg swelling.  Gastrointestinal: Negative.  Negative for abdominal pain, constipation, diarrhea, nausea and vomiting.  Genitourinary: Negative.  Negative for dysuria.  Musculoskeletal: Negative.  Negative for falls and joint pain.  Skin: Negative.  Negative for itching and rash.  Neurological: Positive for weakness. Negative for sensory change, focal weakness and headaches.  Psychiatric/Behavioral: Positive for depression and memory loss. Negative for hallucinations. The patient is not nervous/anxious and does not have insomnia.     As per HPI. Otherwise, a complete review of systems is negative.  PAST MEDICAL  HISTORY: Past Medical History:  Diagnosis Date  . Adenocarcinoma of colon ([place])   . Benign neoplasm of ascending colon   . Essential hypertension 06/13/2014  . Goals of care, counseling/discussion 07/14/2017  . HLD (hyperlipidemia) 06/13/2014  . Hypercholesteremia   . Hyperlipidemia   . Multiple thyroid nodules 01/22/2012  . Osteoporosis   . Polycythemia, secondary 12/26/2014  . Pure hypercholesterolemia 03/15/2015  . Squamous cell carcinoma of head and neck ([place])   . Thyroid nodule     PAST SURGICAL HISTORY: Past Surgical History:  Procedure Laterality Date  . ABDOMINAL HYSTERECTOMY    . COLONOSCOPY WITH PROPOFOL N/A 07/02/2017   Procedure: COLONOSCOPY WITH PROPOFOL;  Surgeon: Lucilla Lame, MD;  Location: [facility];  Service: Endoscopy;  Laterality: N/A;  . ORIF ANKLE FRACTURE Left 08/08/2017   Procedure: OPEN REDUCTION INTERNAL FIXATION (ORIF) ANKLE FRACTURE;  Surgeon: Dereck Leep, MD;  Location: [facility];  Service: Orthopedics;  Laterality: Left;  . POLYPECTOMY N/A 07/02/2017   Procedure: POLYPECTOMY;  Surgeon: Lucilla Lame, MD;  Location: [facility];  Service: Endoscopy;  Laterality: N/A;  . WRIST FRACTURE SURGERY  08/2009   badly broken    FAMILY HISTORY Family History  Problem Relation Age of Onset  . Heart disease Mother   . Diabetes Father        ADVANCED DIRECTIVES:    HEALTH MAINTENANCE: Social History   Tobacco Use  . Smoking status: Former Smoker    Packs/day: 0.25    Types: Cigarettes    Last attempt to quit: 06/17/2017    Years since quitting: 1.2  . Smokeless tobacco: Never Used  Substance Use Topics  . Alcohol use: No  . Drug use:  No     Allergies  Allergen Reactions  . Potassium Nausea Only and Nausea And Vomiting  . Other Other (See Comments) and Rash    TDAP tdap TDAP  . Tetanus Antitoxin Rash    Current Outpatient Medications  Medication Sig Dispense Refill  . ENSURE (ENSURE) Take 237 mLs by mouth.    .  fentaNYL (DURAGESIC - DOSED MCG/HR) 50 MCG/HR Place 50 mcg onto the skin every 3 (three) days.     Marland Kitchen ibuprofen (ADVIL,MOTRIN) 200 MG tablet Take 400 mg by mouth every 4 (four) hours as needed.    Marland Kitchen morphine (ROXANOL) 20 MG/ML concentrated solution Place 0.25 mLs (5 mg total) into feeding tube every 4 (four) hours as needed for moderate pain or severe pain. 30 mL 0  . NARCAN 4 MG/0.1ML LIQD nasal spray kit USE 1 SPRAY PER INTRANASAL ROUTE AS DIRECTED  0  . pravastatin (PRAVACHOL) 10 MG tablet Take 10 mg by mouth daily.    . promethazine (PHENERGAN) 25 MG tablet Take 25 mg by mouth every 6 (six) hours as needed for nausea or vomiting.    . cholestyramine (QUESTRAN) 4 GM/DOSE powder Take 1 packet (4 g total) by mouth 3 (three) times daily with meals. (Patient not taking: Reported on 09/30/2018) 378 g 12   No current facility-administered medications for this visit.    Facility-Administered Medications Ordered in Other Visits  Medication Dose Route Frequency Provider Last Rate Last Dose  . heparin lock flush 100 unit/mL  250 Units Intracatheter PRN Leia Alf, MD      . heparin lock flush 100 unit/mL  500 Units Intracatheter PRN Leia Alf, MD      . potassium chloride 20 mEq in sodium chloride 0.9 % 500 mL injection   Intravenous Once Tye Savoy, Doni H, RN      . sodium chloride 0.9 % injection 10 mL  10 mL Intracatheter PRN Leia Alf, MD      . sodium chloride 0.9 % injection 10 mL  10 mL Intracatheter PRN Leia Alf, MD      . sodium chloride 0.9 % injection 10 mL  10 mL Intracatheter PRN Ma Hillock, Sandeep, MD      . sodium chloride 0.9 % injection 10 mL  10 mL Intracatheter PRN Leia Alf, MD      -year-old  OBJECTIVE: Vitals:   09/30/18 1006  BP: 93/60  Pulse: 97  Temp: (!) 97.3 F (36.3 C)     Body mass index is 17.75 kg/m.    ECOG FS:3 - Symptomatic, >50% confined to bed  General: Well-developed, well-nourished, no acute distress.  Sitting in a  wheelchair. Eyes: Pink conjunctiva, anicteric sclera. HEENT: Normocephalic, moist mucous membranes, clear oropharnyx.  Neck mass significantly reduced. Lungs: Clear to auscultation bilaterally. Heart: Regular rate and rhythm. No rubs, murmurs, or gallops. Abdomen: Soft, nontender, nondistended. No organomegaly noted, normoactive bowel sounds. Musculoskeletal: No edema, cyanosis, or clubbing. Neuro: Alert, answering all questions appropriately. Cranial nerves grossly intact. Skin: No rashes or petechiae noted. Psych: Normal affect.  LAB RESULTS:  Lab Results  Component Value Date   NA 135 09/30/2018   K 3.9 09/30/2018   CL 102 09/30/2018   CO2 26 09/30/2018   GLUCOSE 95 09/30/2018   BUN 24 (H) 09/30/2018   CREATININE 0.82 09/30/2018   CALCIUM 9.0 09/30/2018   PROT 6.8 09/30/2018   ALBUMIN 3.6 09/30/2018   AST 23 09/30/2018   ALT 13 09/30/2018   ALKPHOS 96 09/30/2018   BILITOT  0.5 09/30/2018   GFRNONAA >60 09/30/2018   GFRAA >60 09/30/2018    Lab Results  Component Value Date   WBC 8.5 09/30/2018   NEUTROABS 6.8 09/30/2018   HGB 13.3 09/30/2018   HCT 42.1 09/30/2018   MCV 87.7 09/30/2018   PLT 273 09/30/2018     STUDIES: Nm Pet Image Restag (ps) Skull Base To Thigh  Result Date: 09/13/2018 CLINICAL DATA:  Subsequent treatment strategy for head and neck squamous cell carcinoma. EXAM: NUCLEAR MEDICINE PET SKULL BASE TO THIGH TECHNIQUE: 5.75 mCi F-18 FDG was injected intravenously. Full-ring PET imaging was performed from the skull base to thigh after the radiotracer. CT data was obtained and used for attenuation correction and anatomic localization. Fasting blood glucose: 80 mg/dl COMPARISON:  PET-CT 3189 FINDINGS: Mediastinal blood pool activity: SUV max 1.8 NECK: No hypermetabolic lymph nodes in the neck. Postsurgical change in the RIGHT neck without evidence of hypermetabolic activity suggests residual local disease. Incidental CT findings: none CHEST: Elongated nodule  in the RIGHT upper lobe measures 13 mm and has mild metabolic activity SUV max equal 2.0. This nodule is new from comparison exam. No hypermetabolic mediastinal lymph nodes. Second nodule within the periphery of the RIGHT lower lobe measures 5 mm (image 144/3) and has very low metabolic activity with SUV max equal 1.0 which is less than blood pool activity. Extensive centrilobular emphysema the upper lobes. Incidental CT findings: none ABDOMEN/PELVIS: No abnormal hypermetabolic activity within the liver, pancreas, adrenal glands, or spleen. No hypermetabolic lymph nodes in the abdomen or pelvis. Metabolic activity associated with the PEG tube tract is typical benign finding. Incidental CT findings: Atherosclerotic calcification of the aorta. SKELETON: No focal hypermetabolic activity to suggest skeletal metastasis. Incidental CT findings: none IMPRESSION: 1. Mild metabolic activity associated with small irregular nodule in the RIGHT upper lobe which is new from comparison exams. Findings are concerning for bronchogenic carcinoma versus metastatic carcinoma versus inflammatory nodule. Favor bronchogenic carcinoma. Consider short-term (1-3 months) CT follow-up versus tissue sampling. 2. Smaller nodule within the RIGHT lower lobe is indeterminate. 3. Postsurgical change in the RIGHT neck without metabolic activity suggest residual local disease. 4. No mediastinal metastatic adenopathy. 5. Severe centrilobular emphysema the upper lobes. Electronically Signed   By: Suzy Bouchard M.D.   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  PLAN:    1.  Recurrent, progressive stage IVa head and neck squamous cell carcinoma: PET scan results from September 13, 2018 reviewed independently and reported as above with significant improvement and near resolution of her malignancy.  Her performance status remains decreased, but she is  symptomatically improved as well. Previously we discussed at length the possibility of enrolling in hospice, but patient ultimately decided she would like to try immunotherapy as one last effort for control of disease.  Patient will receive nivolumab every 2 weeks until intolerable side effects or progression of disease.  She expressed understanding that this is likely not curative.  Proceed with cycle 13 today.  Return to clinic in 2 weeks for further evaluation and consideration of cycle 14.  Plan to reimage with CT scan in approximately April 2020.   2.  Diarrhea: Resolved.  Continue cholestyramine, Imodium, and Lomotil as needed.   3.  Decreased performance status: Likely multifactorial.  Chronic and unchanged.  Have recommended reinitiating physical therapy. Patient has declined hospice as above.  Continue follow-up with palliative care as indicated. 4.  Nausea: Patient does not complain of this today.  Continue current medications as prescribed. 5.  Pain: Appears well controlled with current narcotic regimen. 6.  Hallucinations: Patient does not complain of this today.  Unclear etiology, but likely medication induced.  Monitor. 7.  PEG tube: Appreciate surgery and dietary input.  I spent a total of 30 minutes face-to-face with the patient of which greater than 50% of the visit was spent in counseling and coordination of care as detailed above.   Patient and her husband expressed understanding that they can call or return to clinic at any time if they have any questions, concerns, or complaints.  [name], MD 10/01/18 11:14 AM

## 2018-09-28 ENCOUNTER — Other Ambulatory Visit: Payer: Self-pay | Admitting: Oncology

## 2018-09-30 ENCOUNTER — Inpatient Hospital Stay: Payer: PPO | Attending: Oncology

## 2018-09-30 ENCOUNTER — Other Ambulatory Visit: Payer: Self-pay

## 2018-09-30 ENCOUNTER — Inpatient Hospital Stay (HOSPITAL_BASED_OUTPATIENT_CLINIC_OR_DEPARTMENT_OTHER): Payer: PPO | Admitting: Oncology

## 2018-09-30 ENCOUNTER — Inpatient Hospital Stay: Payer: PPO

## 2018-09-30 VITALS — BP 93/60 | HR 97 | Temp 97.3°F | Wt 110.0 lb

## 2018-09-30 DIAGNOSIS — C760 Malignant neoplasm of head, face and neck: Secondary | ICD-10-CM | POA: Diagnosis not present

## 2018-09-30 DIAGNOSIS — Z79899 Other long term (current) drug therapy: Secondary | ICD-10-CM | POA: Diagnosis not present

## 2018-09-30 DIAGNOSIS — Z931 Gastrostomy status: Secondary | ICD-10-CM

## 2018-09-30 DIAGNOSIS — Z85038 Personal history of other malignant neoplasm of large intestine: Secondary | ICD-10-CM

## 2018-09-30 DIAGNOSIS — Z87891 Personal history of nicotine dependence: Secondary | ICD-10-CM | POA: Diagnosis not present

## 2018-09-30 DIAGNOSIS — Z5112 Encounter for antineoplastic immunotherapy: Secondary | ICD-10-CM | POA: Diagnosis not present

## 2018-09-30 LAB — COMPREHENSIVE METABOLIC PANEL
ALBUMIN: 3.6 g/dL (ref 3.5–5.0)
ALT: 13 U/L (ref 0–44)
ANION GAP: 7 (ref 5–15)
AST: 23 U/L (ref 15–41)
Alkaline Phosphatase: 96 U/L (ref 38–126)
BUN: 24 mg/dL — ABNORMAL HIGH (ref 8–23)
CALCIUM: 9 mg/dL (ref 8.9–10.3)
CO2: 26 mmol/L (ref 22–32)
Chloride: 102 mmol/L (ref 98–111)
Creatinine, Ser: 0.82 mg/dL (ref 0.44–1.00)
GFR calc Af Amer: >60 mL/min (ref >60)
GFR calc non Af Amer: >60 mL/min (ref >60)
Glucose, Bld: 95 mg/dL (ref 70–99)
Potassium: 3.9 mmol/L (ref 3.5–5.1)
Sodium: 135 mmol/L (ref 135–145)
Total Bilirubin: 0.5 mg/dL (ref 0.3–1.2)
Total Protein: 6.8 g/dL (ref 6.5–8.1)

## 2018-09-30 LAB — CBC WITH DIFFERENTIAL/PLATELET
Abs Immature Granulocytes: 0.03 10*3/uL (ref 0.00–0.07)
Basophils Absolute: 0 10*3/uL (ref 0.0–0.1)
Basophils Relative: 0 %
Eosinophils Absolute: 0.1 10*3/uL (ref 0.0–0.5)
Eosinophils Relative: 1 %
HEMATOCRIT: 42.1 % (ref 36.0–46.0)
Hemoglobin: 13.3 g/dL (ref 12.0–15.0)
Immature Granulocytes: 0 %
Lymphocytes Relative: 13 %
Lymphs Abs: 1.1 10*3/uL (ref 0.7–4.0)
MCH: 27.7 pg (ref 26.0–34.0)
MCHC: 31.6 g/dL (ref 30.0–36.0)
MCV: 87.7 fL (ref 80.0–100.0)
Monocytes Absolute: 0.5 10*3/uL (ref 0.1–1.0)
Monocytes Relative: 6 %
Neutro Abs: 6.8 10*3/uL (ref 1.7–7.7)
Neutrophils Relative %: 80 %
Platelets: 273 10*3/uL (ref 150–400)
RBC: 4.8 MIL/uL (ref 3.87–5.11)
RDW: 15.8 % — ABNORMAL HIGH (ref 11.5–15.5)
WBC: 8.5 10*3/uL (ref 4.0–10.5)
nRBC: 0 % (ref 0.0–0.2)

## 2018-09-30 LAB — MAGNESIUM: Magnesium: 2.1 mg/dL (ref 1.7–2.4)

## 2018-09-30 MED ORDER — SODIUM CHLORIDE 0.9 % IV SOLN
240.0000 mg | Freq: Once | INTRAVENOUS | Status: AC
  Administered 2018-09-30: 240 mg via INTRAVENOUS
  Filled 2018-09-30: qty 24

## 2018-09-30 MED ORDER — SODIUM CHLORIDE 0.9 % IV SOLN
Freq: Once | INTRAVENOUS | Status: AC
  Administered 2018-09-30: 11:00:00 via INTRAVENOUS
  Filled 2018-09-30: qty 250

## 2018-09-30 NOTE — Progress Notes (Signed)
Patient is here today to follow up on her squamous cell carcinoma of head and neck. Patient stated that she has a lot of pain on her right side of her jaw at times. Patient mentioned this to Dr. Baruch Gouty but was told that it was normal.

## 2018-09-30 NOTE — Progress Notes (Signed)
Nutrition Follow-up:  Patient with head and neck cancer stage IV and adenocarcinoma of the colon.  Patient followed by Dr. Grayland Ormond and receiving nivolulmab.  [facility] on 6/7 at [facility]. Husband reports MD planning treatment until October of 2020  Met with patient and wife during infusion.  Husband reports no issues with diarrhea at this time.  Reports bowel movements about 1 every day or every other day (normal consistency).   Husband giving 4 cartons of ensure plus daily via PEG and tolerating well.    No report of nausea  Medications: reviewed  Labs: reviewed  Anthropometrics:   Weight has increased to 110 lb today from 107 lb 8 oz on 09/16/2018   Estimated Energy Needs  Kcals: 1250-1500 calories Protein: 62-75 g Fluid: 1.5 L/d  NUTRITION DIAGNOSIS: Inadequate oral intake continues but receiving nutrition via PEG   INTERVENTION:  Continue ensure plus at least 4 cartons per day.  If patient can tolerate 4 1/2 cartons or 5 per day would better meet nutritional needs.    MONITORING, EVALUATION, GOAL:  Weight trends, intake   NEXT VISIT: Feb 27 during infusion  Dana Perez, [place], [place] Registered Dietitian [phone] (pager)

## 2018-10-01 LAB — THYROID PANEL WITH TSH
Free Thyroxine Index: 2 (ref 1.2–4.9)
T3 UPTAKE RATIO: 24 % (ref 24–39)
T4, Total: 8.3 ug/dL (ref 4.5–12.0)
TSH: 1.48 u[IU]/mL (ref 0.450–4.500)

## 2018-10-05 DIAGNOSIS — L304 Erythema intertrigo: Secondary | ICD-10-CM | POA: Diagnosis not present

## 2018-10-05 DIAGNOSIS — K9423 Gastrostomy malfunction: Secondary | ICD-10-CM | POA: Diagnosis not present

## 2018-10-05 DIAGNOSIS — I10 Essential (primary) hypertension: Secondary | ICD-10-CM | POA: Diagnosis not present

## 2018-10-09 DIAGNOSIS — C760 Malignant neoplasm of head, face and neck: Secondary | ICD-10-CM | POA: Diagnosis not present

## 2018-10-10 NOTE — Progress Notes (Signed)
[place]  Telephone:([phone]) [phone] Fax:(336) [phone]  ID: EVELEN VAZGUEZ OB: 11/07/1940  MR#: [id number]  YQM#:[id number]  Patient Care Team: Glendon Axe, MD as PCP - General (Internal Medicine) [name], MD as Referring Physician (Surgery)  CHIEF COMPLAINT:  Recurrent, progressive stage IVa head and neck squamous cell carcinoma, adenocarcinoma of the colon.  INTERVAL HISTORY: Patient returns to clinic today for further evaluation and consideration of cycle 14 of palliative nivolumab.  She continues to have a decreased performance status which is unchanged, but otherwise feels well.  Her diarrhea and pain continue to be well controlled.  She continues to gain weight.  She continues to have difficulty eating and uses her PEG tube for 100% of her nutrition.  She has no neurologic complaints.  She denies any recent fevers. She has no chest pain or shortness of breath. She has no urinary complaints.  She had a rash that is now resolved after antifungal cream given to her by her primary care physician.  Patient offers no further specific complaints today.  REVIEW OF SYSTEMS:   Review of Systems  Constitutional: Positive for malaise/fatigue. Negative for fever and weight loss.  HENT: Negative.  Negative for sore throat.   Respiratory: Negative.  Negative for cough, shortness of breath and stridor.   Cardiovascular: Negative.  Negative for chest pain and leg swelling.  Gastrointestinal: Negative.  Negative for abdominal pain, constipation, diarrhea, nausea and vomiting.  Genitourinary: Negative.  Negative for dysuria.  Musculoskeletal: Negative.  Negative for falls and joint pain.  Skin: Negative.  Negative for itching and rash.  Neurological: Positive for weakness. Negative for sensory change, focal weakness and headaches.  Psychiatric/Behavioral: Positive for depression and memory loss. Negative for hallucinations. The patient is not nervous/anxious and does  not have insomnia.     As per HPI. Otherwise, a complete review of systems is negative.  PAST MEDICAL HISTORY: Past Medical History:  Diagnosis Date  . Adenocarcinoma of colon ([place])   . Benign neoplasm of ascending colon   . Essential hypertension 06/13/2014  . Goals of care, counseling/discussion 07/14/2017  . HLD (hyperlipidemia) 06/13/2014  . Hypercholesteremia   . Hyperlipidemia   . Multiple thyroid nodules 01/22/2012  . Osteoporosis   . Polycythemia, secondary 12/26/2014  . Pure hypercholesterolemia 03/15/2015  . Squamous cell carcinoma of head and neck ([place])   . Thyroid nodule     PAST SURGICAL HISTORY: Past Surgical History:  Procedure Laterality Date  . ABDOMINAL HYSTERECTOMY    . COLONOSCOPY WITH PROPOFOL N/A 07/02/2017   Procedure: COLONOSCOPY WITH PROPOFOL;  Surgeon: Lucilla Lame, MD;  Location: [facility];  Service: Endoscopy;  Laterality: N/A;  . ORIF ANKLE FRACTURE Left 08/08/2017   Procedure: OPEN REDUCTION INTERNAL FIXATION (ORIF) ANKLE FRACTURE;  Surgeon: Dereck Leep, MD;  Location: [facility];  Service: Orthopedics;  Laterality: Left;  . POLYPECTOMY N/A 07/02/2017   Procedure: POLYPECTOMY;  Surgeon: Lucilla Lame, MD;  Location: [facility];  Service: Endoscopy;  Laterality: N/A;  . WRIST FRACTURE SURGERY  08/2009   badly broken    FAMILY HISTORY Family History  Problem Relation Age of Onset  . Heart disease Mother   . Diabetes Father        ADVANCED DIRECTIVES:    HEALTH MAINTENANCE: Social History   Tobacco Use  . Smoking status: Former Smoker    Packs/day: 0.25    Types: Cigarettes    Last attempt to quit: 06/17/2017    Years since quitting:  1.3  . Smokeless tobacco: Never Used  Substance Use Topics  . Alcohol use: No  . Drug use: No     Allergies  Allergen Reactions  . Potassium Nausea Only and Nausea And Vomiting  . Other Other (See Comments) and Rash    TDAP tdap TDAP  . Tetanus Antitoxin Rash    Current  Outpatient Medications  Medication Sig Dispense Refill  . cholestyramine (QUESTRAN) 4 GM/DOSE powder Take 1 packet (4 g total) by mouth 3 (three) times daily with meals. 378 g 12  . clotrimazole-betamethasone (LOTRISONE) cream APPLY TO AFFECTED AREA TWICE A DAY    . ENSURE (ENSURE) Take 237 mLs by mouth.    . fentaNYL (DURAGESIC - DOSED MCG/HR) 50 MCG/HR Place 50 mcg onto the skin every 3 (three) days.     Marland Kitchen ibuprofen (ADVIL,MOTRIN) 200 MG tablet Take 400 mg by mouth every 4 (four) hours as needed.    Marland Kitchen NARCAN 4 MG/0.1ML LIQD nasal spray kit USE 1 SPRAY PER INTRANASAL ROUTE AS DIRECTED  0  . pravastatin (PRAVACHOL) 10 MG tablet Take 10 mg by mouth daily.    . promethazine (PHENERGAN) 25 MG tablet Take 25 mg by mouth every 6 (six) hours as needed for nausea or vomiting.    Marland Kitchen morphine (ROXANOL) 20 MG/ML concentrated solution Place 0.25 mLs (5 mg total) into feeding tube every 4 (four) hours as needed for moderate pain or severe pain. 30 mL 0   No current facility-administered medications for this visit.    Facility-Administered Medications Ordered in Other Visits  Medication Dose Route Frequency Provider Last Rate Last Dose  . heparin lock flush 100 unit/mL  250 Units Intracatheter PRN Leia Alf, MD      . heparin lock flush 100 unit/mL  500 Units Intracatheter PRN Leia Alf, MD      . potassium chloride 20 mEq in sodium chloride 0.9 % 500 mL injection   Intravenous Once Tye Savoy, Doni H, RN      . sodium chloride 0.9 % injection 10 mL  10 mL Intracatheter PRN Leia Alf, MD      . sodium chloride 0.9 % injection 10 mL  10 mL Intracatheter PRN Leia Alf, MD      . sodium chloride 0.9 % injection 10 mL  10 mL Intracatheter PRN Ma Hillock, Sandeep, MD      . sodium chloride 0.9 % injection 10 mL  10 mL Intracatheter PRN Leia Alf, MD      -year-old  OBJECTIVE: Vitals:   10/14/18 0933  BP: 116/79  Pulse: 93  Resp: 18  Temp: 97.7 F (36.5 C)     Body mass index is  17.35 kg/m.    ECOG FS:3 - Symptomatic, >50% confined to bed  General: Well-developed, well-nourished, no acute distress.  Sitting in a wheelchair. Eyes: Pink conjunctiva, anicteric sclera. HEENT: Normocephalic, moist mucous membranes, clear oropharnyx.  Neck mass essentially resolved. Lungs: Clear to auscultation bilaterally. Heart: Regular rate and rhythm. No rubs, murmurs, or gallops. Abdomen: Soft, nontender, nondistended. No organomegaly noted, normoactive bowel sounds. Musculoskeletal: No edema, cyanosis, or clubbing. Neuro: Alert, answering all questions appropriately. Cranial nerves grossly intact. Skin: No rashes or petechiae noted. Psych: Normal affect.  LAB RESULTS:  Lab Results  Component Value Date   NA 137 10/14/2018   K 3.8 10/14/2018   CL 104 10/14/2018   CO2 26 10/14/2018   GLUCOSE 106 (H) 10/14/2018   BUN 25 (H) 10/14/2018   CREATININE 0.87 10/14/2018  CALCIUM 9.0 10/14/2018   PROT 6.7 10/14/2018   ALBUMIN 3.5 10/14/2018   AST 25 10/14/2018   ALT 15 10/14/2018   ALKPHOS 84 10/14/2018   BILITOT 0.4 10/14/2018   GFRNONAA >60 10/14/2018   GFRAA >60 10/14/2018    Lab Results  Component Value Date   WBC 8.0 10/14/2018   NEUTROABS 6.1 10/14/2018   HGB 13.8 10/14/2018   HCT 42.5 10/14/2018   MCV 89.1 10/14/2018   PLT 281 10/14/2018     STUDIES: No results found.  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  PLAN:    1.  Recurrent, progressive stage IVa head and neck squamous cell carcinoma: PET scan results from September 13, 2018 reviewed independently with significant improvement and near resolution of her malignancy.  Her performance status remains decreased, but she is symptomatically improved as well. Previously we discussed at length the possibility of enrolling in hospice, but patient ultimately decided she would like to try immunotherapy as one last effort for control of  disease.  Patient will receive nivolumab every 2 weeks until intolerable side effects or progression of disease.  She expressed understanding that this is likely not curative.  Proceed with cycle 14 of treatment today.  Return to clinic in 2 weeks for further evaluation and consideration of cycle 15 of nivolumab.  Plan to reimage with CT scan in approximately April 2020.   2.  Diarrhea: Resolved.  Continue cholestyramine, Imodium, and Lomotil as needed.   3.  Decreased performance status: Likely multifactorial.  Chronic and unchanged.  Have recommended reinitiating physical therapy. Patient has declined hospice as above.  Continue follow-up with palliative care as indicated. 4.  Nausea: Patient does not complain of this today.  Continue current medications as prescribed. 5.  Pain: Patient does not complain of this today.  Appears well controlled with current narcotic regimen. 6.  Hallucinations: Patient does not complain of this today.  Unclear etiology, but likely medication induced.  Monitor. 7.  PEG tube: Appreciate surgery and dietary input. 8.  Rash: Resolved with antifungal cream.  Patient and her husband expressed understanding that they can call or return to clinic at any time if they have any questions, concerns, or complaints.  [name], MD 10/15/18 10:24 PM

## 2018-10-14 ENCOUNTER — Inpatient Hospital Stay (HOSPITAL_BASED_OUTPATIENT_CLINIC_OR_DEPARTMENT_OTHER): Payer: PPO | Admitting: Oncology

## 2018-10-14 ENCOUNTER — Inpatient Hospital Stay: Payer: PPO

## 2018-10-14 ENCOUNTER — Other Ambulatory Visit: Payer: Self-pay | Admitting: *Deleted

## 2018-10-14 ENCOUNTER — Other Ambulatory Visit: Payer: Self-pay

## 2018-10-14 ENCOUNTER — Encounter: Payer: Self-pay | Admitting: Oncology

## 2018-10-14 VITALS — BP 116/79 | HR 93 | Temp 97.7°F | Resp 18 | Wt 107.5 lb

## 2018-10-14 DIAGNOSIS — R110 Nausea: Secondary | ICD-10-CM | POA: Diagnosis not present

## 2018-10-14 DIAGNOSIS — Z931 Gastrostomy status: Secondary | ICD-10-CM | POA: Diagnosis not present

## 2018-10-14 DIAGNOSIS — Z87891 Personal history of nicotine dependence: Secondary | ICD-10-CM | POA: Diagnosis not present

## 2018-10-14 DIAGNOSIS — Z85038 Personal history of other malignant neoplasm of large intestine: Secondary | ICD-10-CM

## 2018-10-14 DIAGNOSIS — C760 Malignant neoplasm of head, face and neck: Secondary | ICD-10-CM | POA: Diagnosis not present

## 2018-10-14 DIAGNOSIS — Z5112 Encounter for antineoplastic immunotherapy: Secondary | ICD-10-CM | POA: Diagnosis not present

## 2018-10-14 LAB — CBC WITH DIFFERENTIAL/PLATELET
Abs Immature Granulocytes: 0.02 10*3/uL (ref 0.00–0.07)
Basophils Absolute: 0 10*3/uL (ref 0.0–0.1)
Basophils Relative: 0 %
Eosinophils Absolute: 0.3 10*3/uL (ref 0.0–0.5)
Eosinophils Relative: 4 %
HCT: 42.5 % (ref 36.0–46.0)
Hemoglobin: 13.8 g/dL (ref 12.0–15.0)
Immature Granulocytes: 0 %
Lymphocytes Relative: 14 %
Lymphs Abs: 1.2 10*3/uL (ref 0.7–4.0)
MCH: 28.9 pg (ref 26.0–34.0)
MCHC: 32.5 g/dL (ref 30.0–36.0)
MCV: 89.1 fL (ref 80.0–100.0)
MONO ABS: 0.4 10*3/uL (ref 0.1–1.0)
Monocytes Relative: 5 %
NEUTROS ABS: 6.1 10*3/uL (ref 1.7–7.7)
Neutrophils Relative %: 77 %
Platelets: 281 10*3/uL (ref 150–400)
RBC: 4.77 MIL/uL (ref 3.87–5.11)
RDW: 15.3 % (ref 11.5–15.5)
WBC: 8 10*3/uL (ref 4.0–10.5)
nRBC: 0 % (ref 0.0–0.2)

## 2018-10-14 LAB — COMPREHENSIVE METABOLIC PANEL
ALT: 15 U/L (ref 0–44)
AST: 25 U/L (ref 15–41)
Albumin: 3.5 g/dL (ref 3.5–5.0)
Alkaline Phosphatase: 84 U/L (ref 38–126)
Anion gap: 7 (ref 5–15)
BILIRUBIN TOTAL: 0.4 mg/dL (ref 0.3–1.2)
BUN: 25 mg/dL — ABNORMAL HIGH (ref 8–23)
CO2: 26 mmol/L (ref 22–32)
Calcium: 9 mg/dL (ref 8.9–10.3)
Chloride: 104 mmol/L (ref 98–111)
Creatinine, Ser: 0.87 mg/dL (ref 0.44–1.00)
GFR calc non Af Amer: >60 mL/min (ref >60)
Glucose, Bld: 106 mg/dL — ABNORMAL HIGH (ref 70–99)
Potassium: 3.8 mmol/L (ref 3.5–5.1)
Sodium: 137 mmol/L (ref 135–145)
TOTAL PROTEIN: 6.7 g/dL (ref 6.5–8.1)

## 2018-10-14 LAB — MAGNESIUM: Magnesium: 2.1 mg/dL (ref 1.7–2.4)

## 2018-10-14 MED ORDER — SODIUM CHLORIDE 0.9 % IV SOLN
240.0000 mg | Freq: Once | INTRAVENOUS | Status: AC
  Administered 2018-10-14: 240 mg via INTRAVENOUS
  Filled 2018-10-14: qty 24

## 2018-10-14 MED ORDER — SODIUM CHLORIDE 0.9 % IV SOLN
Freq: Once | INTRAVENOUS | Status: AC
  Administered 2018-10-14: 11:00:00 via INTRAVENOUS
  Filled 2018-10-14: qty 250

## 2018-10-14 MED ORDER — MORPHINE SULFATE (CONCENTRATE) 20 MG/ML PO SOLN: 5.0000 mg | ORAL | 0 refills | Status: DC | PRN

## 2018-10-14 NOTE — Progress Notes (Signed)
Nutrition Follow-up:  Patient with head and neck cancer stage IV and adenocarcinoma of the colon.  Patient followed by Dr. Grayland Ormond and receiving nivolumab.  G-[facility] placed on 6/7 at [facility].  Husband reports MD planning treatment until October 2020.  Met with patient and husband during infusion.  Husband reports no issues with diarrhea.  Patient has been tolerating 4 cartons of ensure plus daily via PEG.    Husband reports Dr. Candiss Norse prescribe lotrisone cream for around PEG tube site and under breast area for redness and irritation and it has really worked.      Medications: reviewed  Labs: reviewed  Anthropometrics:   Weight 107 lb 8 oz today, decreased slightly from 110 lb on 2/13.    Estimated Energy Needs  Kcals: 1250-1500 calories Protein: 62-75 g Fluid: 1.5 L/d  NUTRITION DIAGNOSIS: Inadequate oral intake continues but receiving nutrition via PEG   INTERVENTION:  Continue ensure plus at least 4 cartons per day.  Husband to work on increase tube feeding to 4 1/2 cartons per day.  We discussed options on how to do that today.  5 cartons would better meet patient's needs.  Husband is aware.    MONITORING, EVALUATION, GOAL: weight trends, intake   NEXT VISIT: March 12 during infusion  Jaki Hammerschmidt B. Zenia Resides, [place], [place] Registered Dietitian [phone] (pager)

## 2018-10-14 NOTE — Progress Notes (Signed)
Patient denies any concerns today.  

## 2018-10-15 LAB — THYROID PANEL WITH TSH
Free Thyroxine Index: 2.1 (ref 1.2–4.9)
T3 UPTAKE RATIO: 25 % (ref 24–39)
T4, Total: 8.2 ug/dL (ref 4.5–12.0)
TSH: 2.1 u[IU]/mL (ref 0.450–4.500)

## 2018-10-24 NOTE — Progress Notes (Signed)
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  INTERVAL HISTORY: Patient returns to clinic today for further evaluation and consideration of cycle 15 of palliative nivolumab.  She continues to have a decreased performance status which is unchanged, but otherwise feels well.  She has increased pruritic skin.  She had several days of diarrhea recently, but this is now resolved.  Her pain is well controlled on her current narcotic regimen.  She continues to gain weight.  She continues to have difficulty eating and uses her PEG tube for 100% of her nutrition.  She has no neurologic complaints.  She denies any recent fevers. She has no chest pain or shortness of breath. She has no urinary complaints.  She had a rash that is now resolved after antifungal cream given to her by her primary care physician.  Patient offers no further specific complaints today.  REVIEW OF SYSTEMS:   Review of Systems  Constitutional: Positive for malaise/fatigue. Negative for fever and weight loss.  HENT: Negative.  Negative for sore throat.   Respiratory: Negative.  Negative for cough, shortness of breath and stridor.   Cardiovascular: Negative.  Negative for chest pain and leg swelling.  Gastrointestinal: Negative.  Negative for abdominal pain, constipation, diarrhea, nausea and vomiting.  Genitourinary: Negative.  Negative for dysuria.  Musculoskeletal: Negative.  Negative for falls and joint pain.  Skin: Negative.  Negative for itching and rash.  Neurological: Positive for weakness. Negative for sensory change, focal weakness and headaches.  Psychiatric/Behavioral:  Positive for depression and memory loss. Negative for hallucinations. The patient is not nervous/anxious and does not have insomnia.     As per HPI. Otherwise, a complete review of systems is negative.  PAST MEDICAL HISTORY: Past Medical History:  Diagnosis Date  . Adenocarcinoma of colon ([place])   . Benign neoplasm of ascending colon   . Essential hypertension 06/13/2014  . Goals of care, counseling/discussion 07/14/2017  . HLD (hyperlipidemia) 06/13/2014  . Hypercholesteremia   . Hyperlipidemia   . Multiple thyroid nodules 01/22/2012  . Osteoporosis   . Polycythemia, secondary 12/26/2014  . Pure hypercholesterolemia 03/15/2015  . Squamous cell carcinoma of head and neck ([place])   . Thyroid nodule     PAST SURGICAL HISTORY: Past Surgical History:  Procedure Laterality Date  . ABDOMINAL HYSTERECTOMY    . COLONOSCOPY WITH PROPOFOL N/A 07/02/2017   Procedure: COLONOSCOPY WITH PROPOFOL;  Surgeon: Lucilla Lame, MD;  Location: [facility];  Service: Endoscopy;  Laterality: N/A;  . ORIF ANKLE FRACTURE Left 08/08/2017   Procedure: OPEN REDUCTION INTERNAL FIXATION (ORIF) ANKLE FRACTURE;  Surgeon: Dereck Leep, MD;  Location: [facility];  Service: Orthopedics;  Laterality: Left;  . POLYPECTOMY N/A 07/02/2017   Procedure: POLYPECTOMY;  Surgeon: Lucilla Lame, MD;  Location: [place];  Service: Endoscopy;  Laterality: N/A;  . WRIST FRACTURE SURGERY  08/2009   badly broken    FAMILY HISTORY Family History  Problem Relation Age of Onset  . Heart disease Mother   . Diabetes Father        ADVANCED DIRECTIVES:    HEALTH MAINTENANCE: Social History   Tobacco Use  . Smoking status: Former Smoker    Packs/day:  0.25    Types: Cigarettes    Last attempt to quit: 06/17/2017    Years since quitting: 1.3  . Smokeless tobacco: Never Used  Substance Use Topics  . Alcohol use: No  . Drug use: No     Allergies  Allergen Reactions  . Potassium Nausea Only and Nausea And  Vomiting  . Other Other (See Comments) and Rash    TDAP tdap TDAP  . Tetanus Antitoxin Rash    Current Outpatient Medications  Medication Sig Dispense Refill  . cholestyramine (QUESTRAN) 4 GM/DOSE powder Take 1 packet (4 g total) by mouth 3 (three) times daily with meals. 378 g 12  . clotrimazole-betamethasone (LOTRISONE) cream APPLY TO AFFECTED AREA TWICE A DAY    . fentaNYL (DURAGESIC - DOSED MCG/HR) 50 MCG/HR Place 50 mcg onto the skin every 3 (three) days.     Marland Kitchen ibuprofen (ADVIL,MOTRIN) 200 MG tablet Take 400 mg by mouth every 4 (four) hours as needed.    Marland Kitchen morphine (ROXANOL) 20 MG/ML concentrated solution Place 0.25 mLs (5 mg total) into feeding tube every 4 (four) hours as needed for moderate pain or severe pain. 30 mL 0  . NARCAN 4 MG/0.1ML LIQD nasal spray kit USE 1 SPRAY PER INTRANASAL ROUTE AS DIRECTED  0  . pravastatin (PRAVACHOL) 10 MG tablet Take 10 mg by mouth daily.    . promethazine (PHENERGAN) 25 MG tablet Take 25 mg by mouth every 6 (six) hours as needed for nausea or vomiting.    . Ensure Plus (ENSURE PLUS) LIQD Give 1 carton of ensure plus 5 times daily via feeding tube.  Flush with 5m of water before and after feeding. 1185 mL 0   No current facility-administered medications for this visit.    Facility-Administered Medications Ordered in Other Visits  Medication Dose Route Frequency Provider Last Rate Last Dose  . heparin lock flush 100 unit/mL  250 Units Intracatheter PRN PLeia Alf MD      . heparin lock flush 100 unit/mL  500 Units Intracatheter PRN PLeia Alf MD      . potassium chloride 20 mEq in sodium chloride 0.9 % 500 mL injection   Intravenous Once STye Savoy Doni H, RN      . sodium chloride 0.9 % injection 10 mL  10 mL Intracatheter PRN PLeia Alf MD      . sodium chloride 0.9 % injection 10 mL  10 mL Intracatheter PRN PMa Hillock Sandeep, MD      . sodium chloride 0.9 % injection 10 mL  10 mL Intracatheter PRN PMa Hillock Sandeep, MD      .  sodium chloride 0.9 % injection 10 mL  10 mL Intracatheter PRN PLeia Alf MD      -year-old  OBJECTIVE: Vitals:   10/28/18 1109  BP: 112/69  Pulse: 86  Resp: 18  Temp: 98.2 F (36.8 C)     Body mass index is 17.58 kg/m.    ECOG FS:3 - Symptomatic, >50% confined to bed  General: Well-developed, well-nourished, no acute distress.  Sitting in a wheelchair. Eyes: Pink conjunctiva, anicteric sclera. HEENT: Normocephalic, moist mucous membranes, clear oropharnyx.  Neck mass resolved. Lungs: Clear to auscultation bilaterally. Heart: Regular rate and rhythm. No rubs, murmurs, or gallops. Abdomen: Soft, nontender, nondistended. No organomegaly noted, normoactive bowel sounds. Musculoskeletal: No edema, cyanosis, or clubbing. Neuro: Alert, answering all questions appropriately. Cranial nerves grossly intact. Skin: No rashes or petechiae noted. Psych: Normal affect.  LAB RESULTS:  Lab Results  Component Value  Date   NA 137 10/28/2018   K 4.2 10/28/2018   CL 104 10/28/2018   CO2 24 10/28/2018   GLUCOSE 98 10/28/2018   BUN 29 (H) 10/28/2018   CREATININE 0.80 10/28/2018   CALCIUM 9.0 10/28/2018   PROT 6.7 10/28/2018   ALBUMIN 3.6 10/28/2018   AST 24 10/28/2018   ALT 17 10/28/2018   ALKPHOS 80 10/28/2018   BILITOT 0.4 10/28/2018   GFRNONAA >60 10/28/2018   GFRAA >60 10/28/2018    Lab Results  Component Value Date   WBC 7.3 10/28/2018   NEUTROABS 5.3 10/28/2018   HGB 14.2 10/28/2018   HCT 43.9 10/28/2018   MCV 90.1 10/28/2018   PLT 249 10/28/2018     STUDIES: No results found.  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  PLAN:    1.  Recurrent, progressive stage IVa head and neck squamous cell carcinoma: PET scan results from September 13, 2018 reviewed independently with significant improvement and near resolution of her malignancy.  Her performance status remains decreased, but she is  symptomatically improved as well. Previously we discussed at length the possibility of enrolling in hospice, but patient ultimately decided she would like to try immunotherapy as one last effort for control of disease.  Patient will receive nivolumab every 2 weeks until intolerable side effects or progression of disease.  She expressed understanding that this is likely not curative.  Proceed with cycle 15 of treatment today.  Return to clinic in 2 weeks for further evaluation and consideration of cycle 16, although we discussed today temporarily discontinuing treatment for several months.   2.  Diarrhea: Resolved.  Continue cholestyramine, Imodium, and Lomotil as needed.   3.  Decreased performance status: Likely multifactorial.  Chronic and unchanged.  Have recommended reinitiating physical therapy. Patient has declined hospice as above.  Continue follow-up with palliative care as indicated. 4.  Nausea: Patient does not complain of this today.  Continue current medications as prescribed. 5.  Pain: Patient does not complain of this today.  Appears well controlled with current narcotic regimen. 6.  Hallucinations: Patient does not complain of this today.  Unclear etiology, but likely medication induced.  Monitor. 7.  PEG tube: Appreciate surgery and dietary input. 8.  Rash/pruritic skin: Continue topical treatment.  Consider temporarily discontinuing treatment as above.  Patient and her husband expressed understanding that they can call or return to clinic at any time if they have any questions, concerns, or complaints.  [name], MD 10/30/18 8:34 AM

## 2018-10-28 ENCOUNTER — Inpatient Hospital Stay: Payer: PPO

## 2018-10-28 ENCOUNTER — Encounter: Payer: Self-pay | Admitting: Oncology

## 2018-10-28 ENCOUNTER — Other Ambulatory Visit: Payer: Self-pay

## 2018-10-28 ENCOUNTER — Inpatient Hospital Stay: Payer: PPO | Attending: Oncology

## 2018-10-28 ENCOUNTER — Inpatient Hospital Stay (HOSPITAL_BASED_OUTPATIENT_CLINIC_OR_DEPARTMENT_OTHER): Payer: PPO | Admitting: Oncology

## 2018-10-28 VITALS — BP 112/69 | HR 86 | Temp 98.2°F | Resp 18 | Wt 108.9 lb

## 2018-10-28 DIAGNOSIS — Z87891 Personal history of nicotine dependence: Secondary | ICD-10-CM

## 2018-10-28 DIAGNOSIS — Z79899 Other long term (current) drug therapy: Secondary | ICD-10-CM | POA: Diagnosis not present

## 2018-10-28 DIAGNOSIS — C760 Malignant neoplasm of head, face and neck: Secondary | ICD-10-CM

## 2018-10-28 DIAGNOSIS — Z5112 Encounter for antineoplastic immunotherapy: Secondary | ICD-10-CM | POA: Diagnosis not present

## 2018-10-28 DIAGNOSIS — Z931 Gastrostomy status: Secondary | ICD-10-CM

## 2018-10-28 DIAGNOSIS — Z85038 Personal history of other malignant neoplasm of large intestine: Secondary | ICD-10-CM | POA: Diagnosis not present

## 2018-10-28 LAB — CBC WITH DIFFERENTIAL/PLATELET
Abs Immature Granulocytes: 0.02 10*3/uL (ref 0.00–0.07)
Basophils Absolute: 0 10*3/uL (ref 0.0–0.1)
Basophils Relative: 0 %
Eosinophils Absolute: 0.3 10*3/uL (ref 0.0–0.5)
Eosinophils Relative: 4 %
HCT: 43.9 % (ref 36.0–46.0)
Hemoglobin: 14.2 g/dL (ref 12.0–15.0)
Immature Granulocytes: 0 %
Lymphocytes Relative: 16 %
Lymphs Abs: 1.1 10*3/uL (ref 0.7–4.0)
MCH: 29.2 pg (ref 26.0–34.0)
MCHC: 32.3 g/dL (ref 30.0–36.0)
MCV: 90.1 fL (ref 80.0–100.0)
Monocytes Absolute: 0.4 10*3/uL (ref 0.1–1.0)
Monocytes Relative: 6 %
NEUTROS ABS: 5.3 10*3/uL (ref 1.7–7.7)
NRBC: 0 % (ref 0.0–0.2)
Neutrophils Relative %: 74 %
Platelets: 249 10*3/uL (ref 150–400)
RBC: 4.87 MIL/uL (ref 3.87–5.11)
RDW: 14.7 % (ref 11.5–15.5)
WBC: 7.3 10*3/uL (ref 4.0–10.5)

## 2018-10-28 LAB — COMPREHENSIVE METABOLIC PANEL
ALT: 17 U/L (ref 0–44)
AST: 24 U/L (ref 15–41)
Albumin: 3.6 g/dL (ref 3.5–5.0)
Alkaline Phosphatase: 80 U/L (ref 38–126)
Anion gap: 9 (ref 5–15)
BUN: 29 mg/dL — ABNORMAL HIGH (ref 8–23)
CO2: 24 mmol/L (ref 22–32)
Calcium: 9 mg/dL (ref 8.9–10.3)
Chloride: 104 mmol/L (ref 98–111)
Creatinine, Ser: 0.8 mg/dL (ref 0.44–1.00)
GFR calc Af Amer: >60 mL/min (ref >60)
GFR calc non Af Amer: >60 mL/min (ref >60)
Glucose, Bld: 98 mg/dL (ref 70–99)
Potassium: 4.2 mmol/L (ref 3.5–5.1)
Sodium: 137 mmol/L (ref 135–145)
Total Bilirubin: 0.4 mg/dL (ref 0.3–1.2)
Total Protein: 6.7 g/dL (ref 6.5–8.1)

## 2018-10-28 MED ORDER — ENSURE PLUS PO LIQD: ORAL | 0 refills | Status: AC

## 2018-10-28 MED ORDER — SODIUM CHLORIDE 0.9 % IV SOLN
240.0000 mg | Freq: Once | INTRAVENOUS | Status: AC
  Administered 2018-10-28: 240 mg via INTRAVENOUS
  Filled 2018-10-28: qty 24

## 2018-10-28 MED ORDER — SODIUM CHLORIDE 0.9 % IV SOLN
Freq: Once | INTRAVENOUS | Status: AC
  Administered 2018-10-28: 12:00:00 via INTRAVENOUS
  Filled 2018-10-28: qty 250

## 2018-10-28 NOTE — Progress Notes (Signed)
Patient here today for follow up regarding head and neck cancer. Patient reports body aches and headaches. Patient also reports dry skin and patches that are flaking off head and neck.

## 2018-10-28 NOTE — Addendum Note (Signed)
Addended by: Jennet Maduro B on: 10/28/2018 01:36 PM   Modules accepted: Orders

## 2018-10-28 NOTE — Progress Notes (Signed)
Nutrition Follow-up:  Patient with head and neck cancer stage IV and adenocarcinoma of the colon.  Patient followed by Dr. Grayland Ormond and receiving nivolumab.  G-tube placed on 6/7 at [facility].   Met with patient and husband in infusion. Husband reports patient is tolerating 5 cartons per day over 5 feedings.  Husband reports gives patient 7, 30m water flushes daily with medications and feedings.   Husband reports 3 days of diarrhea during the last 2 weeks but resolved after giving qSweden    Medications: reviewed  Labs: reviewed  Anthropometrics:   Weight is up to 108 lb 14.4 oz today from 107 lb 8 oz.     Estimated Energy Needs  Kcals: 1250-1500 calories Protein: 62-75 g Fluid: 1.5 L/d  NUTRITION DIAGNOSIS: Inadequate oral intake continues but nutrition being addressed with tube feeding   INTERVENTION:  Continue with 5 cartons of ensure plus (1 carton 5 times per day).   Provides 1750 calories, 65 g of protein, and 13264mfree water (from formula and water flush) Will contact DME with new tube feeding order for ensure plus 5 cartons per day.      MONITORING, EVALUATION, GOAL: weight trends, intake, TF tolerance   NEXT VISIT: March 26 during infusion  Kirstan Fentress B. AlZenia ResidesRDSierra BlancaLDYellow Bluffegistered Dietitian [phone])

## 2018-11-10 ENCOUNTER — Other Ambulatory Visit: Payer: Self-pay

## 2018-11-11 ENCOUNTER — Encounter: Payer: Self-pay | Admitting: Oncology

## 2018-11-11 ENCOUNTER — Inpatient Hospital Stay: Payer: PPO

## 2018-11-11 ENCOUNTER — Inpatient Hospital Stay (HOSPITAL_BASED_OUTPATIENT_CLINIC_OR_DEPARTMENT_OTHER): Payer: PPO | Admitting: Oncology

## 2018-11-11 ENCOUNTER — Other Ambulatory Visit: Payer: Self-pay

## 2018-11-11 VITALS — BP 111/72 | HR 82 | Temp 96.5°F | Wt 111.5 lb

## 2018-11-11 DIAGNOSIS — R110 Nausea: Secondary | ICD-10-CM

## 2018-11-11 DIAGNOSIS — C189 Malignant neoplasm of colon, unspecified: Secondary | ICD-10-CM | POA: Diagnosis not present

## 2018-11-11 DIAGNOSIS — Z85038 Personal history of other malignant neoplasm of large intestine: Secondary | ICD-10-CM | POA: Diagnosis not present

## 2018-11-11 DIAGNOSIS — C760 Malignant neoplasm of head, face and neck: Secondary | ICD-10-CM

## 2018-11-11 DIAGNOSIS — Z5112 Encounter for antineoplastic immunotherapy: Secondary | ICD-10-CM | POA: Diagnosis not present

## 2018-11-11 DIAGNOSIS — Z931 Gastrostomy status: Secondary | ICD-10-CM

## 2018-11-11 DIAGNOSIS — Z87891 Personal history of nicotine dependence: Secondary | ICD-10-CM | POA: Diagnosis not present

## 2018-11-11 LAB — MAGNESIUM: Magnesium: 2.2 mg/dL (ref 1.7–2.4)

## 2018-11-11 LAB — CBC WITH DIFFERENTIAL/PLATELET
Abs Immature Granulocytes: 0.03 10*3/uL (ref 0.00–0.07)
Basophils Absolute: 0 10*3/uL (ref 0.0–0.1)
Basophils Relative: 0 %
Eosinophils Absolute: 0.5 10*3/uL (ref 0.0–0.5)
Eosinophils Relative: 5 %
HEMATOCRIT: 44.3 % (ref 36.0–46.0)
HEMOGLOBIN: 14.7 g/dL (ref 12.0–15.0)
Immature Granulocytes: 0 %
LYMPHS PCT: 13 %
Lymphs Abs: 1.2 10*3/uL (ref 0.7–4.0)
MCH: 29.7 pg (ref 26.0–34.0)
MCHC: 33.2 g/dL (ref 30.0–36.0)
MCV: 89.5 fL (ref 80.0–100.0)
Monocytes Absolute: 0.4 10*3/uL (ref 0.1–1.0)
Monocytes Relative: 5 %
Neutro Abs: 7.1 10*3/uL (ref 1.7–7.7)
Neutrophils Relative %: 77 %
Platelets: 303 10*3/uL (ref 150–400)
RBC: 4.95 MIL/uL (ref 3.87–5.11)
RDW: 13.6 % (ref 11.5–15.5)
WBC: 9.3 10*3/uL (ref 4.0–10.5)
nRBC: 0 % (ref 0.0–0.2)

## 2018-11-11 LAB — COMPREHENSIVE METABOLIC PANEL
ALT: 16 U/L (ref 0–44)
AST: 27 U/L (ref 15–41)
Albumin: 3.5 g/dL (ref 3.5–5.0)
Alkaline Phosphatase: 81 U/L (ref 38–126)
Anion gap: 9 (ref 5–15)
BUN: 30 mg/dL — ABNORMAL HIGH (ref 8–23)
CO2: 21 mmol/L — ABNORMAL LOW (ref 22–32)
Calcium: 9 mg/dL (ref 8.9–10.3)
Chloride: 105 mmol/L (ref 98–111)
Creatinine, Ser: 0.76 mg/dL (ref 0.44–1.00)
GFR calc Af Amer: >60 mL/min (ref >60)
GFR calc non Af Amer: >60 mL/min (ref >60)
Glucose, Bld: 114 mg/dL — ABNORMAL HIGH (ref 70–99)
Potassium: 4 mmol/L (ref 3.5–5.1)
Sodium: 135 mmol/L (ref 135–145)
Total Bilirubin: 0.4 mg/dL (ref 0.3–1.2)
Total Protein: 6.9 g/dL (ref 6.5–8.1)

## 2018-11-11 MED ORDER — SODIUM CHLORIDE 0.9 % IV SOLN
Freq: Once | INTRAVENOUS | Status: AC
  Administered 2018-11-11: 11:00:00 via INTRAVENOUS
  Filled 2018-11-11: qty 250

## 2018-11-11 MED ORDER — PROMETHAZINE HCL 12.5 MG PO TABS: 12.5000 mg | ORAL_TABLET | Freq: Four times a day (QID) | ORAL | 2 refills | Status: DC | PRN

## 2018-11-11 MED ORDER — SODIUM CHLORIDE 0.9 % IV SOLN
240.0000 mg | Freq: Once | INTRAVENOUS | Status: AC
  Administered 2018-11-11: 240 mg via INTRAVENOUS
  Filled 2018-11-11: qty 24

## 2018-11-11 NOTE — Progress Notes (Signed)
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  INTERVAL HISTORY: Patient returns to clinic today for further evaluation and consideration of cycle 16 of palliative nivolumab. She continues to have a decreased performance status which is unchanged, but otherwise feels well.  She continues to have issues with irritated skin.  She continues to have persistent nausea which is well controlled with Phenergan.  She also has occasional diarrhea. Her pain is well controlled on her current narcotic regimen. She continues to have difficulty eating and uses her PEG tube for 100% of her nutrition.  She has no neurologic complaints.  She denies any recent fevers. She has no chest pain or shortness of breath. She has no urinary complaints.  Patient offers no further specific complaints today.  REVIEW OF SYSTEMS:   Review of Systems  Constitutional: Positive for malaise/fatigue. Negative for fever and weight loss.  HENT: Negative.  Negative for sore throat.   Respiratory: Negative.  Negative for cough, shortness of breath and stridor.   Cardiovascular: Negative.  Negative for chest pain and leg swelling.  Gastrointestinal: Positive for nausea. Negative for abdominal pain, constipation, diarrhea and vomiting.  Genitourinary: Negative.  Negative for dysuria.  Musculoskeletal: Negative.  Negative for falls and joint pain.  Skin: Positive for itching. Negative for rash.  Neurological: Positive for weakness. Negative for sensory change, focal weakness and headaches.  Psychiatric/Behavioral: Positive for depression and memory loss. Negative for hallucinations. The  patient is not nervous/anxious and does not have insomnia.     As per HPI. Otherwise, a complete review of systems is negative.  PAST MEDICAL HISTORY: Past Medical History:  Diagnosis Date  . Adenocarcinoma of colon ([place])   . Benign neoplasm of ascending colon   . Essential hypertension 06/13/2014  . Goals of care, counseling/discussion 07/14/2017  . HLD (hyperlipidemia) 06/13/2014  . Hypercholesteremia   . Hyperlipidemia   . Multiple thyroid nodules 01/22/2012  . Osteoporosis   . Polycythemia, secondary 12/26/2014  . Pure hypercholesterolemia 03/15/2015  . Squamous cell carcinoma of head and neck ([place])   . Thyroid nodule     PAST SURGICAL HISTORY: Past Surgical History:  Procedure Laterality Date  . ABDOMINAL HYSTERECTOMY    . COLONOSCOPY WITH PROPOFOL N/A 07/02/2017   Procedure: COLONOSCOPY WITH PROPOFOL;  Surgeon: Lucilla Lame, MD;  Location: [place];  Service: Endoscopy;  Laterality: N/A;  . ORIF ANKLE FRACTURE Left 08/08/2017   Procedure: OPEN REDUCTION INTERNAL FIXATION (ORIF) ANKLE FRACTURE;  Surgeon: Dereck Leep, MD;  Location: [facility];  Service: Orthopedics;  Laterality: Left;  . POLYPECTOMY N/A 07/02/2017   Procedure: POLYPECTOMY;  Surgeon: Lucilla Lame, MD;  Location: [place];  Service: Endoscopy;  Laterality: N/A;  . WRIST FRACTURE SURGERY  08/2009   badly broken    FAMILY HISTORY Family History  Problem Relation Age of Onset  . Heart disease Mother   . Diabetes Father        ADVANCED DIRECTIVES:    HEALTH MAINTENANCE: Social History   Tobacco Use  . Smoking status: Former Smoker    Packs/day: 0.25    Types: Cigarettes    Last attempt to quit: 06/17/2017    Years since quitting: 1.4  .  Smokeless tobacco: Never Used  Substance Use Topics  . Alcohol use: No  . Drug use: No     Allergies  Allergen Reactions  . Potassium Nausea Only and Nausea And Vomiting  . Other Other (See Comments) and Rash    TDAP tdap TDAP  .  Tetanus Antitoxin Rash    Current Outpatient Medications  Medication Sig Dispense Refill  . cholestyramine (QUESTRAN) 4 GM/DOSE powder Take 1 packet (4 g total) by mouth 3 (three) times daily with meals. 378 g 12  . clotrimazole-betamethasone (LOTRISONE) cream APPLY TO AFFECTED AREA TWICE A DAY    . Ensure Plus (ENSURE PLUS) LIQD Give 1 carton of ensure plus 5 times daily via feeding tube.  Flush with 76m of water before and after feeding. 1185 mL 0  . fentaNYL (DURAGESIC - DOSED MCG/HR) 50 MCG/HR Place 50 mcg onto the skin every 3 (three) days.     .Marland Kitchenibuprofen (ADVIL,MOTRIN) 200 MG tablet Take 400 mg by mouth every 4 (four) hours as needed.    .Marland Kitchenmorphine (ROXANOL) 20 MG/ML concentrated solution Place 0.25 mLs (5 mg total) into feeding tube every 4 (four) hours as needed for moderate pain or severe pain. 30 mL 0  . NARCAN 4 MG/0.1ML LIQD nasal spray kit USE 1 SPRAY PER INTRANASAL ROUTE AS DIRECTED  0  . pravastatin (PRAVACHOL) 10 MG tablet Take 10 mg by mouth daily.    . promethazine (PHENERGAN) 12.5 MG tablet Take 1 tablet (12.5 mg total) by mouth every 6 (six) hours as needed for nausea or vomiting. 60 tablet 2   No current facility-administered medications for this visit.    Facility-Administered Medications Ordered in Other Visits  Medication Dose Route Frequency Provider Last Rate Last Dose  . heparin lock flush 100 unit/mL  250 Units Intracatheter PRN PLeia Alf MD      . heparin lock flush 100 unit/mL  500 Units Intracatheter PRN PLeia Alf MD      . potassium chloride 20 mEq in sodium chloride 0.9 % 500 mL injection   Intravenous Once STye Savoy Doni H, RN      . sodium chloride 0.9 % injection 10 mL  10 mL Intracatheter PRN PLeia Alf MD      . sodium chloride 0.9 % injection 10 mL  10 mL Intracatheter PRN PLeia Alf MD      . sodium chloride 0.9 % injection 10 mL  10 mL Intracatheter PRN PMa Hillock Sandeep, MD      . sodium chloride 0.9 % injection 10 mL  10 mL  Intracatheter PRN PLeia Alf MD      -year-old  OBJECTIVE: Vitals:   11/11/18 1035  BP: 111/72  Pulse: 82  Temp: (!) 96.5 F (35.8 C)     Body mass index is 18 kg/m.    ECOG FS:3 - Symptomatic, >50% confined to bed  General: Well-developed, well-nourished, no acute distress.  Sitting in a wheelchair. Eyes: Pink conjunctiva, anicteric sclera. HEENT: Normocephalic, moist mucous membranes, clear oropharnyx.  Neck mass resolved. Lungs: Clear to auscultation bilaterally. Heart: Regular rate and rhythm. No rubs, murmurs, or gallops. Abdomen: Soft, nontender, nondistended. No organomegaly noted, normoactive bowel sounds. Musculoskeletal: No edema, cyanosis, or clubbing. Neuro: Alert, answering all questions appropriately. Cranial nerves grossly intact. Skin: No rashes or petechiae noted. Psych: Normal affect.  LAB RESULTS:  Lab Results  Component Value Date   NA 135 11/11/2018   K 4.0 11/11/2018   CL 105 11/11/2018   CO2 21 (L)  11/11/2018   GLUCOSE 114 (H) 11/11/2018   BUN 30 (H) 11/11/2018   CREATININE 0.76 11/11/2018   CALCIUM 9.0 11/11/2018   PROT 6.9 11/11/2018   ALBUMIN 3.5 11/11/2018   AST 27 11/11/2018   ALT 16 11/11/2018   ALKPHOS 81 11/11/2018   BILITOT 0.4 11/11/2018   GFRNONAA >60 11/11/2018   GFRAA >60 11/11/2018    Lab Results  Component Value Date   WBC 9.3 11/11/2018   NEUTROABS 7.1 11/11/2018   HGB 14.7 11/11/2018   HCT 44.3 11/11/2018   MCV 89.5 11/11/2018   PLT 303 11/11/2018     STUDIES: No results found.  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  PLAN:    1.  Recurrent, progressive stage IVa head and neck squamous cell carcinoma: PET scan results from September 13, 2018 reviewed independently with significant improvement and near resolution of her malignancy.  Her performance status remains decreased, but she is symptomatically improved as well. Previously we  discussed at length the possibility of enrolling in hospice, but patient ultimately decided she would like to try immunotherapy as one last effort for control of disease.  She expressed understanding that this is likely not curative.  Proceed with cycle 16 of treatment today.  Given her increasingly pruritic skin as well as the COVID-19 pandemic, it was decided upon to give patient a break on treatment for several months.  Return to clinic in 2 months for further evaluation and consideration of reinitiating.  2.  Diarrhea: Resolved.  Continue cholestyramine, Imodium, and Lomotil as needed.   3.  Decreased performance status: Likely multifactorial.  Chronic and unchanged.  Have recommended reinitiating physical therapy. Patient has declined hospice as above.  Continue follow-up with palliative care as indicated. 4.  Nausea: Continue Phenergan 12.5 mg 3 times a day as needed. 5.  Pain: Patient does not complain of this today.  Appears well controlled with current narcotic regimen. 6.  Hallucinations: Patient does not complain of this today.  Unclear etiology, but likely medication induced.  Monitor. 7.  PEG tube: Appreciate surgery and dietary input. 8.  Rash/pruritic skin: Continue topical treatment.  Temporarily discontinue treatment as above.   Patient and her husband expressed understanding that they can call or return to clinic at any time if they have any questions, concerns, or complaints.  [name], MD 11/11/18 3:35 PM

## 2018-11-11 NOTE — Progress Notes (Signed)
Patient here today for follow up.  Patient's constipation has resolved with Questran.  Patient has concerns with fentanyl patch itching and irritation.  Requesting either zofran or compazine rather than Phenergan.

## 2018-11-11 NOTE — Progress Notes (Signed)
Nutrition Follow-up:  Patient with head and neck cancer stage IV and adenocarcinoma of colon.  Patient followed by Dr. Grayland Ormond and receiving nivolumab.  G-tube placed on 6/7 at [facility] husband for nutrition follow-up as restricting face to face contact due to COVID-19.  Husband reports that overall patient continues to tolerate ensure plus 5 cartons per day.  Reports did have an episode of vomiting and diarrhea for few days.  Vomiting husband feels was due to sloughing of skin in mouth causing gagging as it has in the past.  Reports diarrhea was controlled with Lucrezia Starch, given to patient over 2 days.   Husband reports that is due to receive new shipment of tube feeding from [facility] on Friday, March 27.     Medications: reviewed  Labs: reviewed  Anthropometrics:   Weight increased to 111 lb 8 oz today from 108 lb 14.4 oz on 3/12   Estimated Energy Needs  Kcals: 1250-1500 calories Protein: 62-75 g Fluid: 1.5 L  NUTRITION DIAGNOSIS: Inadequate oral intake continues but nutrition being addressed with tube feeding   INTERVENTION:  Continue with 5 cartons of ensure plus (1 carton 5 times per day).   Provides 1750 calories, 65 g of protein and 1338ml free water (from formula and water flush) Husband reports will have tube feeding and supplies by tomorrow from DME.       MONITORING, EVALUATION, GOAL:  Weight trends, intake, TF tolerance  NEXT VISIT: Thursday, May 28  Corretta Munce B. Zenia Resides, [place], [place] Registered Dietitian [phone] (pager)

## 2018-11-12 LAB — THYROID PANEL WITH TSH
FREE THYROXINE INDEX: 2.3 (ref 1.2–4.9)
T3 Uptake Ratio: 26 % (ref 24–39)
T4, Total: 9 ug/dL (ref 4.5–12.0)
TSH: 4.33 u[IU]/mL (ref 0.450–4.500)

## 2018-11-16 DIAGNOSIS — K9423 Gastrostomy malfunction: Secondary | ICD-10-CM | POA: Diagnosis not present

## 2018-12-03 ENCOUNTER — Other Ambulatory Visit: Payer: Self-pay | Admitting: *Deleted

## 2018-12-03 MED ORDER — MORPHINE SULFATE (CONCENTRATE) 20 MG/ML PO SOLN: 5.0000 mg | ORAL | 0 refills | Status: DC | PRN

## 2018-12-08 DIAGNOSIS — C189 Malignant neoplasm of colon, unspecified: Secondary | ICD-10-CM | POA: Diagnosis not present

## 2018-12-08 DIAGNOSIS — C760 Malignant neoplasm of head, face and neck: Secondary | ICD-10-CM | POA: Diagnosis not present

## 2019-01-06 DIAGNOSIS — C189 Malignant neoplasm of colon, unspecified: Secondary | ICD-10-CM | POA: Diagnosis not present

## 2019-01-06 DIAGNOSIS — C760 Malignant neoplasm of head, face and neck: Secondary | ICD-10-CM | POA: Diagnosis not present

## 2019-01-10 NOTE — Progress Notes (Signed)
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  INTERVAL HISTORY: Patient returns to clinic today for repeat laboratory work and further evaluation.  She continues to have chronic weakness and fatigue and a decreased performance status, but this is unchanged over the past several months.  She has occasional nausea and vomiting.  She does not complain of pain today.  She continues to receive 100% of her nutrition via PEG tube. She has no neurologic complaints.  She denies any recent fevers.  She denies any chest pain, shortness of breath, cough, or hemoptysis.  She has no urinary complaints.  Patient offers no further specific complaints today.  REVIEW OF SYSTEMS:   Review of Systems  Constitutional: Positive for malaise/fatigue. Negative for fever and weight loss.  HENT: Negative.  Negative for sore throat.   Respiratory: Negative.  Negative for cough, shortness of breath and stridor.   Cardiovascular: Negative.  Negative for chest pain and leg swelling.  Gastrointestinal: Positive for nausea. Negative for abdominal pain, constipation, diarrhea and vomiting.  Genitourinary: Negative.  Negative for dysuria.  Musculoskeletal: Negative.  Negative for falls and joint pain.  Skin: Negative.  Negative for itching and rash.  Neurological: Positive for weakness. Negative for sensory change, focal weakness and headaches.  Psychiatric/Behavioral: Positive for depression and memory loss. Negative for hallucinations. The patient is not nervous/anxious and does not have insomnia.     As per HPI. Otherwise, a complete review of systems is negative.  PAST  MEDICAL HISTORY: Past Medical History:  Diagnosis Date  . Adenocarcinoma of colon ([place])   . Benign neoplasm of ascending colon   . Essential hypertension 06/13/2014  . Goals of care, counseling/discussion 07/14/2017  . HLD (hyperlipidemia) 06/13/2014  . Hypercholesteremia   . Hyperlipidemia   . Multiple thyroid nodules 01/22/2012  . Osteoporosis   . Polycythemia, secondary 12/26/2014  . Pure hypercholesterolemia 03/15/2015  . Squamous cell carcinoma of head and neck ([place])   . Thyroid nodule     PAST SURGICAL HISTORY: Past Surgical History:  Procedure Laterality Date  . ABDOMINAL HYSTERECTOMY    . COLONOSCOPY WITH PROPOFOL N/A 07/02/2017   Procedure: COLONOSCOPY WITH PROPOFOL;  Surgeon: Lucilla Lame, MD;  Location: [facility];  Service: Endoscopy;  Laterality: N/A;  . ORIF ANKLE FRACTURE Left 08/08/2017   Procedure: OPEN REDUCTION INTERNAL FIXATION (ORIF) ANKLE FRACTURE;  Surgeon: Dereck Leep, MD;  Location: [facility];  Service: Orthopedics;  Laterality: Left;  . POLYPECTOMY N/A 07/02/2017   Procedure: POLYPECTOMY;  Surgeon: Lucilla Lame, MD;  Location: [facility];  Service: Endoscopy;  Laterality: N/A;  . WRIST FRACTURE SURGERY  08/2009   badly broken    FAMILY HISTORY Family History  Problem Relation Age of Onset  . Heart disease Mother   . Diabetes Father        ADVANCED DIRECTIVES:    HEALTH MAINTENANCE: Social History   Tobacco Use  . Smoking status: Former Smoker    Packs/day: 0.25    Types: Cigarettes    Last attempt to quit: 06/17/2017    Years since quitting: 1.5  . Smokeless tobacco: Never Used  Substance Use Topics  . Alcohol use: No  . Drug use:  No     Allergies  Allergen Reactions  . Potassium Nausea Only and Nausea And Vomiting  . Other Other (See Comments) and Rash    TDAP tdap TDAP  . Tetanus Antitoxin Rash    Current Outpatient Medications  Medication Sig Dispense Refill  . cholestyramine (QUESTRAN) 4 GM/DOSE  powder Take 1 packet (4 g total) by mouth 3 (three) times daily with meals. 378 g 12  . clotrimazole-betamethasone (LOTRISONE) cream APPLY TO AFFECTED AREA TWICE A DAY    . Ensure Plus (ENSURE PLUS) LIQD Give 1 carton of ensure plus 5 times daily via feeding tube.  Flush with 33m of water before and after feeding. 1185 mL 0  . fentaNYL (DURAGESIC - DOSED MCG/HR) 50 MCG/HR Place 50 mcg onto the skin every 3 (three) days.     .Marland Kitchenibuprofen (ADVIL,MOTRIN) 200 MG tablet Take 400 mg by mouth every 4 (four) hours as needed.    .Marland Kitchenmorphine (ROXANOL) 20 MG/ML concentrated solution Place 0.25 mLs (5 mg total) into feeding tube every 4 (four) hours as needed for moderate pain or severe pain. 30 mL 0  . NARCAN 4 MG/0.1ML LIQD nasal spray kit USE 1 SPRAY PER INTRANASAL ROUTE AS DIRECTED  0  . pravastatin (PRAVACHOL) 10 MG tablet Take 10 mg by mouth daily.    . promethazine (PHENERGAN) 12.5 MG tablet Take 1 tablet (12.5 mg total) by mouth every 6 (six) hours as needed for nausea or vomiting. 60 tablet 2   No current facility-administered medications for this visit.    Facility-Administered Medications Ordered in Other Visits  Medication Dose Route Frequency Provider Last Rate Last Dose  . heparin lock flush 100 unit/mL  250 Units Intracatheter PRN PLeia Alf MD      . heparin lock flush 100 unit/mL  500 Units Intracatheter PRN PLeia Alf MD      . potassium chloride 20 mEq in sodium chloride 0.9 % 500 mL injection   Intravenous Once STye Savoy Doni H, RN      . sodium chloride 0.9 % injection 10 mL  10 mL Intracatheter PRN PLeia Alf MD      . sodium chloride 0.9 % injection 10 mL  10 mL Intracatheter PRN PMa Hillock Sandeep, MD      . sodium chloride 0.9 % injection 10 mL  10 mL Intracatheter PRN PMa Hillock Sandeep, MD      . sodium chloride 0.9 % injection 10 mL  10 mL Intracatheter PRN PLeia Alf MD      -year-old  OBJECTIVE: Vitals:   01/13/19 0949  BP: 113/80  Pulse: (!) 113  Resp:  18  Temp: (!) 96.9 F (36.1 C)     Body mass index is 19.85 kg/m.    ECOG FS:3 - Symptomatic, >50% confined to bed  General: Well-developed, well-nourished, no acute distress.  Sitting in a wheelchair Eyes: Pink conjunctiva, anicteric sclera. HEENT: Normocephalic, moist mucous membranes, clear oropharnyx.  Neck mass resolved. Lungs: Clear to auscultation bilaterally. Heart: Regular rate and rhythm. No rubs, murmurs, or gallops. Abdomen: Soft, nontender, nondistended. No organomegaly noted, normoactive bowel sounds. Musculoskeletal: No edema, cyanosis, or clubbing. Neuro: Alert, answering all questions appropriately.  Mildly confused.  Cranial nerves grossly intact. Skin: No rashes or petechiae noted. Psych: Flat affect.  LAB RESULTS:  Lab Results  Component Value Date   NA 137 01/13/2019   K 4.1 01/13/2019   CL 104 01/13/2019   CO2 23 01/13/2019   GLUCOSE 107 (H) 01/13/2019   BUN  26 (H) 01/13/2019   CREATININE 0.84 01/13/2019   CALCIUM 9.2 01/13/2019   PROT 6.9 01/13/2019   ALBUMIN 3.8 01/13/2019   AST 27 01/13/2019   ALT 16 01/13/2019   ALKPHOS 84 01/13/2019   BILITOT 0.4 01/13/2019   GFRNONAA >60 01/13/2019   GFRAA >60 01/13/2019    Lab Results  Component Value Date   WBC 9.6 01/13/2019   NEUTROABS 6.9 01/13/2019   HGB 15.4 (H) 01/13/2019   HCT 45.9 01/13/2019   MCV 89.8 01/13/2019   PLT 261 01/13/2019     STUDIES: No results found.  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  PLAN:    1.  Recurrent, progressive stage IVa head and neck squamous cell carcinoma: PET scan results from September 13, 2018 reviewed independently with significant improvement and near resolution of her malignancy.  Her performance status remains decreased, but she is symptomatically improved as well. Previously we discussed at length the possibility of enrolling in hospice, but patient ultimately decided she would  like to try immunotherapy as one last effort for control of disease.  She expressed understanding that this is likely not curative.  Patient's last infusion of nivolumab was on November 11, 2018.  She continues to have minimal evidence of disease.  She does not require retreatment at this time.  Return to clinic in 2 months with repeat imaging and further evaluation.   2.  Diarrhea: Resolved.  Continue cholestyramine, Imodium, and Lomotil as needed.   3.  Decreased performance status: Likely multifactorial.  Chronic and unchanged. Patient has declined hospice as above.  Continue follow-up with palliative care as indicated. 4.  Nausea/vomiting: Continue Phenergan 12.5 mg 3 times a day as needed. 5.  Pain: Patient does not complain of this today.  Appears well controlled with current narcotic regimen. 6.  Hallucinations: Patient does not complain of this today.  Unclear etiology, but likely medication induced.  Monitor. 7.  PEG tube: Appreciate surgery and dietary input. 8.  Rash/pruritic skin: Resolved.   Patient and her husband expressed understanding that they can call or return to clinic at any time if they have any questions, concerns, or complaints.  [name], MD 01/14/19 8:06 AM

## 2019-01-12 ENCOUNTER — Other Ambulatory Visit: Payer: Self-pay

## 2019-01-13 ENCOUNTER — Inpatient Hospital Stay: Payer: PPO

## 2019-01-13 ENCOUNTER — Inpatient Hospital Stay (HOSPITAL_BASED_OUTPATIENT_CLINIC_OR_DEPARTMENT_OTHER): Payer: PPO | Admitting: Oncology

## 2019-01-13 ENCOUNTER — Inpatient Hospital Stay: Payer: PPO | Attending: Oncology

## 2019-01-13 ENCOUNTER — Other Ambulatory Visit: Payer: Self-pay

## 2019-01-13 ENCOUNTER — Encounter: Payer: Self-pay | Admitting: Oncology

## 2019-01-13 VITALS — BP 113/80 | HR 113 | Temp 96.9°F | Resp 18 | Wt 123.0 lb

## 2019-01-13 DIAGNOSIS — R5382 Chronic fatigue, unspecified: Secondary | ICD-10-CM | POA: Diagnosis not present

## 2019-01-13 DIAGNOSIS — Z85038 Personal history of other malignant neoplasm of large intestine: Secondary | ICD-10-CM

## 2019-01-13 DIAGNOSIS — R112 Nausea with vomiting, unspecified: Secondary | ICD-10-CM | POA: Diagnosis not present

## 2019-01-13 DIAGNOSIS — Z931 Gastrostomy status: Secondary | ICD-10-CM | POA: Diagnosis not present

## 2019-01-13 DIAGNOSIS — Z87891 Personal history of nicotine dependence: Secondary | ICD-10-CM | POA: Diagnosis not present

## 2019-01-13 DIAGNOSIS — R5383 Other fatigue: Secondary | ICD-10-CM | POA: Diagnosis not present

## 2019-01-13 DIAGNOSIS — C760 Malignant neoplasm of head, face and neck: Secondary | ICD-10-CM | POA: Insufficient documentation

## 2019-01-13 LAB — COMPREHENSIVE METABOLIC PANEL
ALT: 16 U/L (ref 0–44)
AST: 27 U/L (ref 15–41)
Albumin: 3.8 g/dL (ref 3.5–5.0)
Alkaline Phosphatase: 84 U/L (ref 38–126)
Anion gap: 10 (ref 5–15)
BUN: 26 mg/dL — ABNORMAL HIGH (ref 8–23)
CO2: 23 mmol/L (ref 22–32)
Calcium: 9.2 mg/dL (ref 8.9–10.3)
Chloride: 104 mmol/L (ref 98–111)
Creatinine, Ser: 0.84 mg/dL (ref 0.44–1.00)
GFR calc Af Amer: >60 mL/min (ref >60)
GFR calc non Af Amer: >60 mL/min (ref >60)
Glucose, Bld: 107 mg/dL — ABNORMAL HIGH (ref 70–99)
Potassium: 4.1 mmol/L (ref 3.5–5.1)
Sodium: 137 mmol/L (ref 135–145)
Total Bilirubin: 0.4 mg/dL (ref 0.3–1.2)
Total Protein: 6.9 g/dL (ref 6.5–8.1)

## 2019-01-13 LAB — CBC WITH DIFFERENTIAL/PLATELET
Abs Immature Granulocytes: 0.02 10*3/uL (ref 0.00–0.07)
Basophils Absolute: 0 10*3/uL (ref 0.0–0.1)
Basophils Relative: 0 %
Eosinophils Absolute: 0.5 10*3/uL (ref 0.0–0.5)
Eosinophils Relative: 5 %
HCT: 45.9 % (ref 36.0–46.0)
Hemoglobin: 15.4 g/dL — ABNORMAL HIGH (ref 12.0–15.0)
Immature Granulocytes: 0 %
Lymphocytes Relative: 17 %
Lymphs Abs: 1.6 10*3/uL (ref 0.7–4.0)
MCH: 30.1 pg (ref 26.0–34.0)
MCHC: 33.6 g/dL (ref 30.0–36.0)
MCV: 89.8 fL (ref 80.0–100.0)
Monocytes Absolute: 0.6 10*3/uL (ref 0.1–1.0)
Monocytes Relative: 6 %
Neutro Abs: 6.9 10*3/uL (ref 1.7–7.7)
Neutrophils Relative %: 72 %
Platelets: 261 10*3/uL (ref 150–400)
RBC: 5.11 MIL/uL (ref 3.87–5.11)
RDW: 12.1 % (ref 11.5–15.5)
WBC: 9.6 10*3/uL (ref 4.0–10.5)
nRBC: 0 % (ref 0.0–0.2)

## 2019-01-13 LAB — MAGNESIUM: Magnesium: 2.3 mg/dL (ref 1.7–2.4)

## 2019-01-13 NOTE — Progress Notes (Signed)
Patient reports nausea and vomiting this morning. Patient denies pain.

## 2019-01-13 NOTE — Progress Notes (Signed)
Nutrition Follow-up:  RD working remotely.  Patient with head and neck cancer stage IV and adenocarcinoma of colon.  Patient followed by Dr. Grayland Ormond and receiving nivolumab.  G-[facility] placed on 6/7 at [facility].  Spoke with husband for nutrition follow-up.  Husband reports that patient had nausea and vomiting this am prior to coming to cancer center.  Husband reports patient got sick at the cancer center in the lab area and treatment was held.  Husband not sure if dead skin in throat is making her sick and what.  Reports that he has been really trying to remove it recently.    Husband reports that overall patient has been tolerating 5 cartons of ensure plus daily.  Has not gotten in all 5 today due to nausea. Medications have been given.  Husband reports only few bouts of diarrhea, overall not an issue.    Husband reports has plenty of tube feeding supplies from [place].      Medications: reviewed  Labs: reviewed  Anthropometrics:   Weight per chart 123 lb today.  Husband reports 112 lb 5 oz on home scale.     Estimated Energy Needs  Kcals: 1250-1500 calories Protein: 62-75 g Fluid: 1.5 L  NUTRITION DIAGNOSIS: Inadequate oral intake continue but nutrition being addressed with tube feeding   INTERVENTION:  Continue with 5 cartons of ensure plus (1 carton 5 times per day). Continue nausea medication and removing dead skin from mouth. Tube feeding provided 1750 calories, 65 g of protein and 1320 ml free water (formula and water flush) Husband has contact information    MONITORING, EVALUATION, GOAL: weight trends, TF tolerance   NEXT VISIT: phone f/u July 13 as RD on PAL week of 7/20 when patient set to return to clinic  [place]. Zenia Resides, [place], [place] Registered Dietitian [phone] (pager)

## 2019-01-14 LAB — THYROID PANEL WITH TSH
Free Thyroxine Index: 2.2 (ref 1.2–4.9)
T3 Uptake Ratio: 26 % (ref 24–39)
T4, Total: 8.3 ug/dL (ref 4.5–12.0)
TSH: 2.02 u[IU]/mL (ref 0.450–4.500)

## 2019-01-17 ENCOUNTER — Inpatient Hospital Stay: Payer: PPO | Attending: Hospice and Palliative Medicine | Admitting: Hospice and Palliative Medicine

## 2019-01-17 ENCOUNTER — Other Ambulatory Visit: Payer: Self-pay | Admitting: *Deleted

## 2019-01-17 ENCOUNTER — Other Ambulatory Visit: Payer: Self-pay

## 2019-01-17 ENCOUNTER — Telehealth: Payer: Self-pay

## 2019-01-17 DIAGNOSIS — C760 Malignant neoplasm of head, face and neck: Secondary | ICD-10-CM | POA: Diagnosis not present

## 2019-01-17 DIAGNOSIS — R110 Nausea: Secondary | ICD-10-CM

## 2019-01-17 DIAGNOSIS — Z710 Person encountering health services to consult on behalf of another person: Secondary | ICD-10-CM

## 2019-01-17 DIAGNOSIS — Z515 Encounter for palliative care: Secondary | ICD-10-CM | POA: Diagnosis not present

## 2019-01-17 DIAGNOSIS — G893 Neoplasm related pain (acute) (chronic): Secondary | ICD-10-CM

## 2019-01-17 MED ORDER — PROCHLORPERAZINE MALEATE 10 MG PO TABS: 10.0000 mg | ORAL_TABLET | Freq: Four times a day (QID) | ORAL | 0 refills | Status: DC | PRN

## 2019-01-17 MED ORDER — MORPHINE SULFATE (CONCENTRATE) 20 MG/ML PO SOLN: 5.0000 mg | ORAL | 0 refills | Status: DC | PRN

## 2019-01-17 NOTE — Telephone Encounter (Signed)
I tried calling husband but did not reach him. Message left.

## 2019-01-17 NOTE — Progress Notes (Signed)
Virtual Visit via Telephone Note  I connected with Dana Perez on 01/17/19 at  9:30 AM EDT by telephone and verified that I am speaking with the correct person using two identifiers.   I discussed the limitations, risks, security and privacy concerns of performing an evaluation and management service by telephone and the availability of in person appointments. I also discussed with the patient that there may be a patient responsible charge related to this service. The patient expressed understanding and agreed to proceed.   History of Present Illness: Palliative Care consult requested for this 78 y.o. 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  PMH also includes adenocarcinoma of the colon not currently on treatment, malnutrition status post PEG insertion, she had recent bilateral ankle fractures causing a marked reduction in her performance status, chronic pain on transdermal fentanyl, hypertension, hyperlipidemia, and CKD stage III.  Patient has had disease progression and general decline despite ongoing treatment of her head & neck cancer.  Hospice had been discussed but patient/husband opted to continue treatment with nivolumab.  Palliative care is now asked to follow and provide her with support.   Observations/Objective: Spoke with patient's husband by phone.  Patient was present and unable to engage in conversation due to her head and neck cancer.  Husband reports that patient has had intermittent nausea and vomiting over the past several weeks.  Nausea occurs almost daily following tube feeds and she often vomits once or twice a day.  Husband is currently feeding every 2 to 2-1/2 hours.  He has been giving her Phenergan 12.5 mg via tube 2-3 times a day but it has not been effective.  Patient is reportedly  having normal bowel movements.  She has no abdominal pain.  She has no fever or chills.  Husband feels that nausea is attributed to gagging related to patient's oral mucosa and OP sloughing.  He continues to provide frequent daily oral care.    Assessment and Plan: Nausea/vomiting: I suspect that symptoms may be attributed to residual TF volumes or delayed gastric emptying.  Will speak with dietitian to see if we can adjust patient's feeding schedule or consider continuous feeding overnight.  Will rotate from Phenergan to Compazine.  If that is ineffective, would consider trial of metoclopramide.  Case and plan discussed with Dr. Grayland Ormond  Follow Up Instructions: We will follow-up via telemetry visit later this week and plan to see patient again in the clinic in 1 month   I discussed the assessment and treatment plan with the patient. The patient was provided an opportunity to ask questions and all were answered. The patient agreed with the plan and demonstrated an understanding of the instructions.   The patient was advised to call back or seek an in-person evaluation if the symptoms worsen or if the condition fails to improve as anticipated.  I provided 15 minutes of non-face-to-face time during this encounter.   Irean Hong, NP

## 2019-01-17 NOTE — Telephone Encounter (Signed)
[name] called reporting that Dana Perez is vomiting a lt and that the Phenergan is NOT helping it. He states the he had cut her ensure in half and that her stomach is just not handling it and he is asking for something else for the vomiting before "she withers away". Please advise.

## 2019-01-17 NOTE — Telephone Encounter (Signed)
Nutrition Follow-up:  RD working remotely.  Patient with head and neck cancer stage IV and adenocarcinoma of colon.  Patient followed by Dr. Grayland Ormond and receiving nivolumab.  G-[facility] placed on 6/7 from [facility].  Nutrition follow-up requested from [place], NP due to patient with continued nausea.  Per Josh planning to add compazine and considering reglan.   Called and spoke with husband.  Husband has been giving ensure plus 5 cartons (7am, 9:30, 12, 2:30, 5pm) daily.  Rate increased in March 2020 to 5 cartons per day. Water flush minimal with 30 ml before and after feeding and with medications.  Husband reports wife goes to bed around 9 pm.  Reports that he removes 1-2 spoonfuls of dead skin from mouth daily.    Medications: adding compazine  Labs: reviewed  No new weight   Estimated Energy Needs  Kcals: 1250-1500 calories Protein: 62-75 g Fluid: 1.5 L  NUTRITION DIAGNOSIS: Inadequate oral intake continues but relying on feeding tube for nutrition    INTERVENTION:  Encouraged husband to spread feedings out to at least 3-4 hours apart. Husband planning to give feeding at 7am 10am, 1pm, 4pm, 7 pm.  Did not feel that 4 hours apart would be realistic with patient going to bed.   Discussed gravity bag feedings and continuous pump feeding as well with husband.  Wants to try new medicine and spacing feedings out first.   Husband discussed dropping off 5 carton of ensure plus.  Concerned that patient will not be getting enough calories and protein as she had been gaining and/stable weight with 5 cartons. Contact information provided    MONITORING, EVALUATION, GOAL: weight trends, TF tolerance   NEXT VISIT: phone f/u on Monday, June 8  Ghazal Pevey B. Zenia Resides, [place], [place] Registered Dietitian [phone] (pager)

## 2019-01-20 ENCOUNTER — Other Ambulatory Visit: Payer: Self-pay

## 2019-01-21 ENCOUNTER — Inpatient Hospital Stay (HOSPITAL_BASED_OUTPATIENT_CLINIC_OR_DEPARTMENT_OTHER): Payer: PPO | Admitting: Hospice and Palliative Medicine

## 2019-01-21 ENCOUNTER — Other Ambulatory Visit: Payer: Self-pay

## 2019-01-21 DIAGNOSIS — C760 Malignant neoplasm of head, face and neck: Secondary | ICD-10-CM

## 2019-01-21 DIAGNOSIS — R110 Nausea: Secondary | ICD-10-CM | POA: Diagnosis not present

## 2019-01-21 DIAGNOSIS — Z515 Encounter for palliative care: Secondary | ICD-10-CM | POA: Diagnosis not present

## 2019-01-21 MED ORDER — PROCHLORPERAZINE MALEATE 10 MG PO TABS: 10.0000 mg | ORAL_TABLET | Freq: Four times a day (QID) | ORAL | 2 refills | Status: DC | PRN

## 2019-01-21 NOTE — Progress Notes (Signed)
Virtual Visit via Telephone Note  I connected with Dana Perez on 01/21/19 at  1:00 PM EDT by telephone and verified that I am speaking with the correct person using two identifiers.   I discussed the limitations, risks, security and privacy concerns of performing an evaluation and management service by telephone and the availability of in person appointments. I also discussed with the patient that there may be a patient responsible charge related to this service. The patient expressed understanding and agreed to proceed.   History of Present Illness: Palliative Care consult requested for this 78 y.o. 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  PMH also includes adenocarcinoma of the colon not currently on treatment, malnutrition status post PEG insertion, she had recent bilateral ankle fractures causing a marked reduction in her performance status, chronic pain on transdermal fentanyl, hypertension, hyperlipidemia, and CKD stage III.  Patient has had disease progression and general decline despite ongoing treatment of her head & neck cancer.  Hospice had been discussed but patient/husband opted to continue treatment with nivolumab.  Palliative care is now asked to follow and provide her with support.   Observations/Objective: Spoke with patient's husband by phone.  Patient was present and unable to engage in conversation due to her head and neck cancer.  Patient's husband reports that since starting the Compazine, patient's nausea has been significantly improved.  She has only had one episode of vomiting this week.  However, I note that husband is also spacing out the feedings slightly longer and that might be a significant factor that has led to improvement in her symptoms.  I appreciate RD assistance.  I suspect  residual gastric volume is contributing to symptoms.  Would consider trial of metoclopramide if needed.  Follow Up Instructions: We will follow-up via telemetry visit later next week and plan to see patient again in the clinic in 1 month   I discussed the assessment and treatment plan with the patient. The patient was provided an opportunity to ask questions and all were answered. The patient agreed with the plan and demonstrated an understanding of the instructions.   The patient was advised to call back or seek an in-person evaluation if the symptoms worsen or if the condition fails to improve as anticipated.  I provided 15 minutes of non-face-to-face time during this encounter.   Irean Hong, NP

## 2019-01-24 ENCOUNTER — Inpatient Hospital Stay: Payer: PPO

## 2019-01-24 ENCOUNTER — Telehealth: Payer: Self-pay

## 2019-01-24 NOTE — Telephone Encounter (Signed)
Nutrition Follow-up:  RD working remotely.  Patient with head and neck cancer stage IV and adenocarcinoma of colon.  Patient followed by Dr. Grayland Ormond and receiving nivolumab.  [facility] on 6/7 from [facility].  Spoke with husband via phone this am for nutrition follow-up.  Husband reports that he is giving tube feeding at least 3 hours apart sometimes more just depending on schedule.  Reports that nausea has improved with compazine.  Did report episode this am of where patient was coughing due to dead skin in mouth and throat and starting gagging.      Medications: reviewed  Labs: reviewed  Anthropometrics:   No new weight   Estimated Energy Needs  Kcals: 1250-1500 calories Protein: 62-75 g Fluid: 1.5 L  NUTRITION DIAGNOSIS: Inadequate oral intake continues but relying on feeding tube for nutrition   INTERVENTION:  Husband will continue giving ensure plus at least 3 hours apart with goal of getting 5 cartons per day.   Encouraged continued use of compazine for nausea as well.  Husband has contact information    MONITORING, EVALUATION, GOAL: weight trends, TF tolerance   NEXT VISIT: phone f/u July 13  Dana Perez B. Zenia Resides, [place], [place] Registered Dietitian [phone] (pager)

## 2019-01-26 ENCOUNTER — Other Ambulatory Visit: Payer: Self-pay

## 2019-01-27 ENCOUNTER — Inpatient Hospital Stay (HOSPITAL_BASED_OUTPATIENT_CLINIC_OR_DEPARTMENT_OTHER): Payer: PPO | Admitting: Hospice and Palliative Medicine

## 2019-01-27 DIAGNOSIS — R110 Nausea: Secondary | ICD-10-CM | POA: Diagnosis not present

## 2019-01-27 DIAGNOSIS — G893 Neoplasm related pain (acute) (chronic): Secondary | ICD-10-CM

## 2019-01-27 DIAGNOSIS — Z515 Encounter for palliative care: Secondary | ICD-10-CM | POA: Diagnosis not present

## 2019-01-27 DIAGNOSIS — C760 Malignant neoplasm of head, face and neck: Secondary | ICD-10-CM | POA: Diagnosis not present

## 2019-01-27 DIAGNOSIS — R531 Weakness: Secondary | ICD-10-CM | POA: Diagnosis not present

## 2019-01-27 NOTE — Progress Notes (Signed)
Virtual Visit via Telephone Note  I connected with Dana Perez on 01/27/19 at 11:00 AM EDT by telephone and verified that I am speaking with the correct person using two identifiers.   I discussed the limitations, risks, security and privacy concerns of performing an evaluation and management service by telephone and the availability of in person appointments. I also discussed with the patient that there may be a patient responsible charge related to this service. The patient expressed understanding and agreed to proceed.   History of Present Illness: Palliative Care consult requested for this 78 y.o. 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  PMH also includes adenocarcinoma of the colon not currently on treatment, malnutrition status post PEG insertion, she had recent bilateral ankle fractures causing a marked reduction in her performance status, chronic pain on transdermal fentanyl, hypertension, hyperlipidemia, and CKD stage III.  Patient has had disease progression and general decline despite ongoing treatment of her head & neck cancer.  Hospice had been discussed but patient/husband opted to continue treatment with nivolumab.  Palliative care is now asked to follow and provide her with support.   Observations/Objective: Spoke with patient's husband by phone.  Patient was present and unable to engage in conversation due to her head and neck cancer.  Patient's husband reports that she is doing reasonably well without any recent acute changes or concerns.  He says that symptomatically, patient has improved.  She has not had any recent episodes of nausea or vomiting.  He is still administering Compazine twice daily and finds that has been effective.  He is administering feedings on an every 3-hour schedule.  Pain  is stable.    Follow Up Instructions: I offered follow-up via telephone or clinic visit as needed in the event of symptoms worsening. Otherwise, will plan to see patient at the next scheduled clinic visit in July.    I discussed the assessment and treatment plan with the patient. The patient was provided an opportunity to ask questions and all were answered. The patient agreed with the plan and demonstrated an understanding of the instructions.   The patient was advised to call back or seek an in-person evaluation if the symptoms worsen or if the condition fails to improve as anticipated.  I provided 15 minutes of non-face-to-face time during this encounter.   Irean Hong, NP

## 2019-02-07 DIAGNOSIS — C760 Malignant neoplasm of head, face and neck: Secondary | ICD-10-CM | POA: Diagnosis not present

## 2019-02-07 DIAGNOSIS — C189 Malignant neoplasm of colon, unspecified: Secondary | ICD-10-CM | POA: Diagnosis not present

## 2019-02-14 ENCOUNTER — Telehealth: Payer: Self-pay | Admitting: *Deleted

## 2019-02-14 NOTE — Telephone Encounter (Signed)
Patient husband called requesting J Borders call him about her stomach medicine. [phone]

## 2019-02-15 NOTE — Telephone Encounter (Signed)
I spoke with patient's husband. He says patient's nausea is significantly improved. He has noted some bloating and residual volume in the stomach following some feedings. He says this does not happen with every feeding. Patient also endorses constipation. I recommended daily bowel regimen in attempt to improve her constipation. We had talked in the past about concern for delayed gastric emptying and could try metoclopramide. Could also further adjust her feeding schedule if necessary. She is being followed by RD.

## 2019-02-17 ENCOUNTER — Telehealth: Payer: Self-pay | Admitting: Oncology

## 2019-02-21 ENCOUNTER — Inpatient Hospital Stay: Payer: PPO | Attending: Oncology

## 2019-02-21 DIAGNOSIS — R531 Weakness: Secondary | ICD-10-CM | POA: Insufficient documentation

## 2019-02-21 DIAGNOSIS — Z85038 Personal history of other malignant neoplasm of large intestine: Secondary | ICD-10-CM | POA: Insufficient documentation

## 2019-02-21 DIAGNOSIS — Z931 Gastrostomy status: Secondary | ICD-10-CM | POA: Insufficient documentation

## 2019-02-21 DIAGNOSIS — R5382 Chronic fatigue, unspecified: Secondary | ICD-10-CM | POA: Insufficient documentation

## 2019-02-21 DIAGNOSIS — C760 Malignant neoplasm of head, face and neck: Secondary | ICD-10-CM | POA: Insufficient documentation

## 2019-02-21 DIAGNOSIS — Z87891 Personal history of nicotine dependence: Secondary | ICD-10-CM | POA: Insufficient documentation

## 2019-02-21 NOTE — Progress Notes (Signed)
Nutrition Follow-up:  RD working remotely.  Patient with head and neck cancer stage IV and adenocarcinoma of colon.  Patient followed by Dr. Grayland Ormond and Merrily Pew, Palliative Care NP.  Patient currently not receiving treatment.  Has G-tube placed on 6/7 from [facility].  Spoke with husband via phone this pm.  Husband reports that patient is doing better but not 100%.  Reports that patient constipation is better after giving miralax for 2 days.  Reports that out of 4 days patient typically has a bowel movement on 3 of those days.  Reports this am she got up and said that she did not feel good and went to the bathroom and had good bowel movement.  Waited to give tube feeding about 30-45 minutes after bowel movement and felt better.  Reports he only gave miralax for 2 days vs 3 because on 2nd day stool was too loose and had accident.  Husband reports that he is giving feeding about every 3 hours and patient is tolerating better.    Medications: reviewed  Labs: no new labs  Anthropometrics:   Husband thinks she weighs about 112 lb but has not weighed her recently at home.    Estimated Energy Needs  Kcals: 1250-1500 calories Protein: 62-75 g Fluid: 1.5 L  NUTRITION DIAGNOSIS: Inadequate oral intake continues but relying on feeding tube for nutrition   INTERVENTION:  Continue ensure plus q 3 hours for total of 5 cartons per day.  Continuous pump feeding is option as well and has been discussed with husband.  He has been resistant to pump feeding, previously.   Reviewed importance of preventing constipation and providing intervention if needed especially with tube feeding.      MONITORING, EVALUATION, GOAL: weight trends, TF tolerance   NEXT VISIT: phone f/u July 27  Haskell Rihn B. Zenia Resides, [place], [place] Registered Dietitian [phone] (pager)

## 2019-02-24 ENCOUNTER — Other Ambulatory Visit: Payer: Self-pay | Admitting: Hospice and Palliative Medicine

## 2019-02-24 DIAGNOSIS — I10 Essential (primary) hypertension: Secondary | ICD-10-CM | POA: Diagnosis not present

## 2019-02-24 DIAGNOSIS — Z0000 Encounter for general adult medical examination without abnormal findings: Secondary | ICD-10-CM | POA: Diagnosis not present

## 2019-02-24 DIAGNOSIS — C189 Malignant neoplasm of colon, unspecified: Secondary | ICD-10-CM | POA: Diagnosis not present

## 2019-02-24 DIAGNOSIS — J449 Chronic obstructive pulmonary disease, unspecified: Secondary | ICD-10-CM | POA: Diagnosis not present

## 2019-02-24 DIAGNOSIS — C760 Malignant neoplasm of head, face and neck: Secondary | ICD-10-CM | POA: Diagnosis not present

## 2019-02-24 DIAGNOSIS — R110 Nausea: Secondary | ICD-10-CM | POA: Diagnosis not present

## 2019-02-24 MED ORDER — METOCLOPRAMIDE HCL 10 MG PO TABS: 10.0000 mg | ORAL_TABLET | Freq: Three times a day (TID) | ORAL | 0 refills | Status: DC | PRN

## 2019-02-24 NOTE — Progress Notes (Signed)
Spoke with husband by phone. He feels that the nausea and vomiting are less effective now on compazine. Nausea seems associated with feedings. Patient has also had some constipation and we discussed use of MiraLAX.  Will give trial of metoclopramide.  Case and plan discussed with Dr. Grayland Ormond.

## 2019-03-01 DIAGNOSIS — K9423 Gastrostomy malfunction: Secondary | ICD-10-CM | POA: Diagnosis not present

## 2019-03-02 ENCOUNTER — Telehealth: Payer: Self-pay | Admitting: Hospice and Palliative Medicine

## 2019-03-02 ENCOUNTER — Other Ambulatory Visit: Payer: Self-pay

## 2019-03-02 DIAGNOSIS — C760 Malignant neoplasm of head, face and neck: Secondary | ICD-10-CM

## 2019-03-02 NOTE — Telephone Encounter (Signed)
Patient's husband called triage regarding diarrhea. I spoke with him by phone. He reports intermittent diarrhea after starting the metoclopramide.  We discussed holding the metoclopramide and slowing the tube feeding.  May use OTC Imodium if needed.  May also continue use of Compazine for nausea.  Patient is pending CT scan of the chest on 7/20.  Will add CT of abdomen pelvis on same day.  Patient will see both Dr. Grayland Ormond and me next week.  Case discussed with Dr. Grayland Ormond.

## 2019-03-06 NOTE — Progress Notes (Signed)
[place]  Telephone:([phone]) [phone] Fax:([phone]) [phone]  ID: Dana Perez OB: July 18, 1941  MR#: [id number]  EHU#:[id number]  Patient Care Team: [name], MD as PCP - General (Physician Assistant) [name], MD as Referring Physician (Surgery)  CHIEF COMPLAINT:  Recurrent, progressive stage IVa head and neck squamous cell carcinoma, adenocarcinoma of the colon.  INTERVAL HISTORY: Patient returns to clinic today for repeat laboratory work, further evaluation, and discussion of her imaging results.  She continues to have chronic weakness and fatigue, but she is more alert and her performance status is improving.  Her husband does not report any significant nausea or vomiting.  She continues to require 100% of nutrition via her PEG tube.  She only has occasional diarrhea.  She has no neurologic complaints.  She denies any recent fevers or illnesses.  She denies any chest pain, shortness of breath, cough, or hemoptysis.  She has no urinary complaints.  Patient offers no further specific complaints today.  REVIEW OF SYSTEMS:   Review of Systems  Constitutional: Positive for malaise/fatigue. Negative for fever and weight loss.  HENT: Negative.  Negative for sore throat.   Respiratory: Negative.  Negative for cough, shortness of breath and stridor.   Cardiovascular: Negative.  Negative for chest pain and leg swelling.  Gastrointestinal: Negative.  Negative for abdominal pain, constipation, diarrhea, nausea and vomiting.  Genitourinary: Negative.  Negative for dysuria.  Musculoskeletal: Negative.  Negative for falls and joint pain.  Skin: Negative.  Negative for itching and rash.  Neurological: Positive for weakness. Negative for sensory change, focal weakness and headaches.  Psychiatric/Behavioral: Positive for memory loss. Negative for depression and hallucinations. The patient is not nervous/anxious and does not have insomnia.     As per HPI.  Otherwise, a complete review of systems is negative.  PAST MEDICAL HISTORY: Past Medical History:  Diagnosis Date   Adenocarcinoma of colon ([facility])    Benign neoplasm of ascending colon    Essential hypertension 06/13/2014   Goals of care, counseling/discussion 07/14/2017   HLD (hyperlipidemia) 06/13/2014   Hypercholesteremia    Hyperlipidemia    Multiple thyroid nodules 01/22/2012   Osteoporosis    Polycythemia, secondary 12/26/2014   Pure hypercholesterolemia 03/15/2015   Squamous cell carcinoma of head and neck ([facility])    Thyroid nodule     PAST SURGICAL HISTORY: Past Surgical History:  Procedure Laterality Date   ABDOMINAL HYSTERECTOMY     COLONOSCOPY WITH PROPOFOL N/A 07/02/2017   Procedure: COLONOSCOPY WITH PROPOFOL;  Surgeon: Lucilla Lame, MD;  Location: [facility];  Service: Endoscopy;  Laterality: N/A;   ORIF ANKLE FRACTURE Left 08/08/2017   Procedure: OPEN REDUCTION INTERNAL FIXATION (ORIF) ANKLE FRACTURE;  Surgeon: Dereck Leep, MD;  Location: [facility];  Service: Orthopedics;  Laterality: Left;   POLYPECTOMY N/A 07/02/2017   Procedure: POLYPECTOMY;  Surgeon: Lucilla Lame, MD;  Location: [facility];  Service: Endoscopy;  Laterality: N/A;   WRIST FRACTURE SURGERY  08/2009   badly broken    FAMILY HISTORY Family History  Problem Relation Age of Onset   Heart disease Mother    Diabetes Father        ADVANCED DIRECTIVES:    HEALTH MAINTENANCE: Social History   Tobacco Use   Smoking status: Former Smoker    Packs/day: 0.25    Types: Cigarettes    Quit date: 06/17/2017    Years since quitting: 1.7   Smokeless tobacco: Never Used  Substance Use Topics   Alcohol  use: No   Drug use: No     Allergies  Allergen Reactions   Potassium Nausea Only and Nausea And Vomiting   Other Other (See Comments) and Rash    TDAP tdap TDAP   Tetanus Antitoxin Rash    Current Outpatient Medications  Medication Sig Dispense  Refill   cholestyramine (QUESTRAN) 4 GM/DOSE powder Take 1 packet (4 g total) by mouth 3 (three) times daily with meals. 378 g 12   clotrimazole-betamethasone (LOTRISONE) cream APPLY TO AFFECTED AREA TWICE A DAY     Ensure Plus (ENSURE PLUS) LIQD Give 1 carton of ensure plus 5 times daily via feeding tube.  Flush with 90m of water before and after feeding. 1185 mL 0   fentaNYL (DURAGESIC - DOSED MCG/HR) 50 MCG/HR Place 50 mcg onto the skin every 3 (three) days.      ibuprofen (ADVIL,MOTRIN) 200 MG tablet Take 400 mg by mouth every 4 (four) hours as needed.     metoCLOPramide (REGLAN) 10 MG tablet Take 1 tablet (10 mg total) by mouth every 8 (eight) hours as needed for nausea. 90 tablet 0   morphine (ROXANOL) 20 MG/ML concentrated solution Place 0.25 mLs (5 mg total) into feeding tube every 4 (four) hours as needed for moderate pain or severe pain. 30 mL 0   NARCAN 4 MG/0.1ML LIQD nasal spray kit USE 1 SPRAY PER INTRANASAL ROUTE AS DIRECTED  0   Polyethylene Glycol 3350 (PEG 3350) 17 GM/SCOOP POWD Take 17 g by mouth 1 day or 1 dose.     pravastatin (PRAVACHOL) 10 MG tablet Take 10 mg by mouth daily.     prochlorperazine (COMPAZINE) 10 MG tablet Take 1 tablet (10 mg total) by mouth every 6 (six) hours as needed for nausea or vomiting. 60 tablet 2   No current facility-administered medications for this visit.    Facility-Administered Medications Ordered in Other Visits  Medication Dose Route Frequency Provider Last Rate Last Dose   heparin lock flush 100 unit/mL  250 Units Intracatheter PRN PLeia Alf MD       heparin lock flush 100 unit/mL  500 Units Intracatheter PRN PLeia Alf MD       potassium chloride 20 mEq in sodium chloride 0.9 % 500 mL injection   Intravenous Once STye Savoy Doni H, RN       sodium chloride 0.9 % injection 10 mL  10 mL Intracatheter PRN PLeia Alf MD       sodium chloride 0.9 % injection 10 mL  10 mL Intracatheter PRN PLeia Alf MD        sodium chloride 0.9 % injection 10 mL  10 mL Intracatheter PRN PLeia Alf MD       sodium chloride 0.9 % injection 10 mL  10 mL Intracatheter PRN PLeia Alf MD      -year-old  OBJECTIVE: Vitals:   03/10/19 1018  BP: 122/78  Pulse: 96  Temp: (!) 97.3 F (36.3 C)     Body mass index is 17.43 kg/m.    ECOG FS:2 - Symptomatic, <50% confined to bed  General: Well-developed, well-nourished, no acute distress.  Sitting in a wheelchair. Eyes: Pink conjunctiva, anicteric sclera. HEENT: Normocephalic, moist mucous membranes, clear oropharnyx.  Neck mass resolved. Lungs: Clear to auscultation bilaterally. Heart: Regular rate and rhythm. No rubs, murmurs, or gallops. Abdomen: Soft, nontender, nondistended. No organomegaly noted, normoactive bowel sounds. Musculoskeletal: No edema, cyanosis, or clubbing. Neuro: Alert, answering all questions appropriately. Cranial nerves grossly intact. Skin: No  rashes or petechiae noted. Psych: Normal affect.  LAB RESULTS:  Lab Results  Component Value Date   NA 138 03/07/2019   K 4.3 03/07/2019   CL 104 03/07/2019   CO2 24 03/07/2019   GLUCOSE 112 (H) 03/07/2019   BUN 31 (H) 03/07/2019   CREATININE 0.91 03/07/2019   CALCIUM 9.0 03/07/2019   PROT 7.3 03/07/2019   ALBUMIN 4.0 03/07/2019   AST 24 03/07/2019   ALT 23 03/07/2019   ALKPHOS 94 03/07/2019   BILITOT 0.5 03/07/2019   GFRNONAA >60 03/07/2019   GFRAA >60 03/07/2019    Lab Results  Component Value Date   WBC 8.0 03/07/2019   NEUTROABS 6.3 03/07/2019   HGB 16.8 (H) 03/07/2019   HCT 50.4 (H) 03/07/2019   MCV 91.0 03/07/2019   PLT 287 03/07/2019     STUDIES: Ct Soft Tissue Neck W Contrast  Result Date: 03/07/2019 CLINICAL DATA:  Head neck malignancy EXAM: CT NECK WITH CONTRAST TECHNIQUE: Multidetector CT imaging of the neck was performed using the standard protocol following the bolus administration of intravenous contrast. CONTRAST:  1102m OMNIPAQUE IOHEXOL 300  MG/ML  SOLN COMPARISON:  05/08/2017.  PET-CT 09/13/2018 FINDINGS: Pharynx and larynx: Submucosal low-density most likely from radiotherapy. No masslike findings at the site of patient's oral cavity surgery with right segmental mandibulectomy and pectoralis flap coverage. No metachronous mass is seen. Mucus/secretions in the oral cavity and oropharynx. Salivary glands: Post treatment changes including right submandibular gland resection Thyroid: Negative Lymph nodes: None enlarged or low-density. Vascular: Heavy atherosclerotic plaque in the cervical carotids and aorta. Limited intracranial: Negative Visualized orbits: Negative Mastoids and visualized paranasal sinuses: Partial right mastoid opacification with negative nasopharynx. Skeleton: Degenerative disease without acute or aggressive finding. Upper chest: Chest CT reported separately. Emphysema. IMPRESSION: Postoperative neck without evidence of recurrence. Electronically Signed   By: JMonte FantasiaM.D.   On: 03/07/2019 10:42   Ct Chest W Contrast  Result Date: 03/07/2019 CLINICAL DATA:  Squamous cell carcinoma of the head/neck EXAM: CT CHEST, ABDOMEN, AND PELVIS WITH CONTRAST TECHNIQUE: Multidetector CT imaging of the chest, abdomen and pelvis was performed following the standard protocol during bolus administration of intravenous contrast. CONTRAST:  102mOMNIPAQUE IOHEXOL 300 MG/ML  SOLN COMPARISON:  Multiple exams, including PET-CT from 09/13/2018 FINDINGS: CT CHEST FINDINGS Cardiovascular: Coronary, aortic arch, and branch vessel atherosclerotic vascular disease. Moderate pericardial effusion, similar to on the prior PET-CT. Mediastinum/Nodes: No pathologic thoracic adenopathy. Lungs/Pleura: Biapical pleuroparenchymal scarring. The elongated right upper lobe nodularity shown on the prior exam measures 1.3 by 0.2 cm on image 13/5, previously 1.3 by 0.4 cm. Indistinct nodularity along a bandlike density in the right lower lobe measures 0.4 by 0.3 cm  on image 38/5, previously indistinct but about 0.6 by 0.4 cm. Linear density slightly more caudad in the right lower lobe on image 42/5 measures 0.5 by 0.2 cm, stable. Sub solid nodule 0.4 cm in diameter on image 23/5, stable, with adjacent calcification along its anterior margin. Centrilobular emphysema. Musculoskeletal: Unremarkable CT ABDOMEN PELVIS FINDINGS Hepatobiliary: Unremarkable Pancreas: Unremarkable Spleen: Unremarkable Adrenals/Urinary Tract: Unremarkable Stomach/Bowel: Peg tube noted. Vascular/Lymphatic: Aortoiliac atherosclerotic vascular disease. No pathologic adenopathy identified. Reproductive: Uterus absent.  Adnexa unremarkable. Other: No supplemental non-categorized findings. Musculoskeletal: Unremarkable IMPRESSION: 1. Reduced size of the bandlike right upper lobe nodule, previously 1.3 by 0.4 cm and currently 1.3 by 0.2 cm. This lesion previously had low-grade metabolic activity with maximum SUV 2.0. 2. Reduced size of 1 of the 2 bandlike densities in the  right lower lobe. Stable sub solid 0.4 cm nodule in the right upper lobe. 3. The stability and reduction in size of these nodules is reassuring, but surveillance is likely warranted. 4. Other imaging findings of potential clinical significance: Aortic Atherosclerosis (ICD10-I70.0) and Emphysema (ICD10-J43.9). Coronary atherosclerosis. Stable moderate pericardial effusion. Peg tube noted. Electronically Signed   By: Van Clines M.D.   On: 03/07/2019 10:40   Ct Abdomen Pelvis W Contrast  Result Date: 03/07/2019 CLINICAL DATA:  Squamous cell carcinoma of the head/neck EXAM: CT CHEST, ABDOMEN, AND PELVIS WITH CONTRAST TECHNIQUE: Multidetector CT imaging of the chest, abdomen and pelvis was performed following the standard protocol during bolus administration of intravenous contrast. CONTRAST:  144m OMNIPAQUE IOHEXOL 300 MG/ML  SOLN COMPARISON:  Multiple exams, including PET-CT from 09/13/2018 FINDINGS: CT CHEST FINDINGS  Cardiovascular: Coronary, aortic arch, and branch vessel atherosclerotic vascular disease. Moderate pericardial effusion, similar to on the prior PET-CT. Mediastinum/Nodes: No pathologic thoracic adenopathy. Lungs/Pleura: Biapical pleuroparenchymal scarring. The elongated right upper lobe nodularity shown on the prior exam measures 1.3 by 0.2 cm on image 13/5, previously 1.3 by 0.4 cm. Indistinct nodularity along a bandlike density in the right lower lobe measures 0.4 by 0.3 cm on image 38/5, previously indistinct but about 0.6 by 0.4 cm. Linear density slightly more caudad in the right lower lobe on image 42/5 measures 0.5 by 0.2 cm, stable. Sub solid nodule 0.4 cm in diameter on image 23/5, stable, with adjacent calcification along its anterior margin. Centrilobular emphysema. Musculoskeletal: Unremarkable CT ABDOMEN PELVIS FINDINGS Hepatobiliary: Unremarkable Pancreas: Unremarkable Spleen: Unremarkable Adrenals/Urinary Tract: Unremarkable Stomach/Bowel: Peg tube noted. Vascular/Lymphatic: Aortoiliac atherosclerotic vascular disease. No pathologic adenopathy identified. Reproductive: Uterus absent.  Adnexa unremarkable. Other: No supplemental non-categorized findings. Musculoskeletal: Unremarkable IMPRESSION: 1. Reduced size of the bandlike right upper lobe nodule, previously 1.3 by 0.4 cm and currently 1.3 by 0.2 cm. This lesion previously had low-grade metabolic activity with maximum SUV 2.0. 2. Reduced size of 1 of the 2 bandlike densities in the right lower lobe. Stable sub solid 0.4 cm nodule in the right upper lobe. 3. The stability and reduction in size of these nodules is reassuring, but surveillance is likely warranted. 4. Other imaging findings of potential clinical significance: Aortic Atherosclerosis (ICD10-I70.0) and Emphysema (ICD10-J43.9). Coronary atherosclerosis. Stable moderate pericardial effusion. Peg tube noted. Electronically Signed   By: WVan ClinesM.D.   On: 03/07/2019 10:40     ASSESSMENT: Recurrent, progressive stage IVa head and neck squamous cell carcinoma, adenocarcinoma of the colon.  PLAN:    1.  Recurrent, progressive stage IVa head and neck squamous cell carcinoma: CT scan results from March 07, 2019 reviewed independently and reported as above with no obvious evidence of recurrent or progressive disease.  Her performance status remains decreased, but significantly improved. Patient's last infusion of nivolumab was on November 11, 2018.  No intervention is needed at this time.  Patient was given a referral back to her local ENT physician.  Return to clinic in 3 months with repeat imaging and further evaluation.   2.  Diarrhea: Resolved.  Continue Imodium and Lomotil as needed.   3.  Decreased performance status: Likely multifactorial.  Improved.  Continue follow-up with palliative care as indicated. 4.  Nausea/vomiting: Patient does not complain of this today.  Continue Phenergan as needed. 5.  Pain: Patient does not complain of this today.  Appears well controlled with current narcotic regimen. 6.  PEG tube: Continue tube feeds as per dietary.  Appreciate surgery  and dietary input.  Patient and her husband expressed understanding that they can call or return to clinic at any time if they have any questions, concerns, or complaints.  [name], MD 03/11/19 6:59 AM

## 2019-03-07 ENCOUNTER — Other Ambulatory Visit: Payer: Self-pay

## 2019-03-07 ENCOUNTER — Inpatient Hospital Stay: Payer: PPO

## 2019-03-07 ENCOUNTER — Ambulatory Visit
Admission: RE | Admit: 2019-03-07 | Discharge: 2019-03-07 | Disposition: A | Discharge status: Home / Self Care | Payer: PPO | Source: Ambulatory Visit | Attending: Oncology | Admitting: Oncology

## 2019-03-07 ENCOUNTER — Ambulatory Visit: Payer: PPO

## 2019-03-07 DIAGNOSIS — Z85038 Personal history of other malignant neoplasm of large intestine: Secondary | ICD-10-CM | POA: Diagnosis not present

## 2019-03-07 DIAGNOSIS — Z931 Gastrostomy status: Secondary | ICD-10-CM | POA: Insufficient documentation

## 2019-03-07 DIAGNOSIS — I2510 Atherosclerotic heart disease of native coronary artery without angina pectoris: Secondary | ICD-10-CM | POA: Diagnosis not present

## 2019-03-07 DIAGNOSIS — J432 Centrilobular emphysema: Secondary | ICD-10-CM | POA: Diagnosis not present

## 2019-03-07 DIAGNOSIS — R531 Weakness: Secondary | ICD-10-CM | POA: Diagnosis not present

## 2019-03-07 DIAGNOSIS — C4442 Squamous cell carcinoma of skin of scalp and neck: Secondary | ICD-10-CM | POA: Diagnosis not present

## 2019-03-07 DIAGNOSIS — Z87891 Personal history of nicotine dependence: Secondary | ICD-10-CM | POA: Diagnosis not present

## 2019-03-07 DIAGNOSIS — Z90710 Acquired absence of both cervix and uterus: Secondary | ICD-10-CM | POA: Insufficient documentation

## 2019-03-07 DIAGNOSIS — C760 Malignant neoplasm of head, face and neck: Secondary | ICD-10-CM | POA: Insufficient documentation

## 2019-03-07 DIAGNOSIS — R911 Solitary pulmonary nodule: Secondary | ICD-10-CM | POA: Diagnosis not present

## 2019-03-07 DIAGNOSIS — R918 Other nonspecific abnormal finding of lung field: Secondary | ICD-10-CM | POA: Diagnosis not present

## 2019-03-07 DIAGNOSIS — R5382 Chronic fatigue, unspecified: Secondary | ICD-10-CM | POA: Diagnosis not present

## 2019-03-07 DIAGNOSIS — I313 Pericardial effusion (noninflammatory): Secondary | ICD-10-CM | POA: Diagnosis not present

## 2019-03-07 DIAGNOSIS — I700 Atherosclerosis of aorta: Secondary | ICD-10-CM | POA: Diagnosis not present

## 2019-03-07 LAB — COMPREHENSIVE METABOLIC PANEL
ALT: 23 U/L (ref 0–44)
AST: 24 U/L (ref 15–41)
Albumin: 4 g/dL (ref 3.5–5.0)
Alkaline Phosphatase: 94 U/L (ref 38–126)
Anion gap: 10 (ref 5–15)
BUN: 31 mg/dL — ABNORMAL HIGH (ref 8–23)
CO2: 24 mmol/L (ref 22–32)
Calcium: 9 mg/dL (ref 8.9–10.3)
Chloride: 104 mmol/L (ref 98–111)
Creatinine, Ser: 0.91 mg/dL (ref 0.44–1.00)
GFR calc Af Amer: >60 mL/min (ref >60)
GFR calc non Af Amer: >60 mL/min (ref >60)
Glucose, Bld: 112 mg/dL — ABNORMAL HIGH (ref 70–99)
Potassium: 4.3 mmol/L (ref 3.5–5.1)
Sodium: 138 mmol/L (ref 135–145)
Total Bilirubin: 0.5 mg/dL (ref 0.3–1.2)
Total Protein: 7.3 g/dL (ref 6.5–8.1)

## 2019-03-07 LAB — CBC WITH DIFFERENTIAL/PLATELET
Abs Immature Granulocytes: 0.02 10*3/uL (ref 0.00–0.07)
Basophils Absolute: 0 10*3/uL (ref 0.0–0.1)
Basophils Relative: 0 %
Eosinophils Absolute: 0.1 10*3/uL (ref 0.0–0.5)
Eosinophils Relative: 1 %
HCT: 50.4 % — ABNORMAL HIGH (ref 36.0–46.0)
Hemoglobin: 16.8 g/dL — ABNORMAL HIGH (ref 12.0–15.0)
Immature Granulocytes: 0 %
Lymphocytes Relative: 15 %
Lymphs Abs: 1.2 10*3/uL (ref 0.7–4.0)
MCH: 30.3 pg (ref 26.0–34.0)
MCHC: 33.3 g/dL (ref 30.0–36.0)
MCV: 91 fL (ref 80.0–100.0)
Monocytes Absolute: 0.4 10*3/uL (ref 0.1–1.0)
Monocytes Relative: 5 %
Neutro Abs: 6.3 10*3/uL (ref 1.7–7.7)
Neutrophils Relative %: 79 %
Platelets: 287 10*3/uL (ref 150–400)
RBC: 5.54 MIL/uL — ABNORMAL HIGH (ref 3.87–5.11)
RDW: 12 % (ref 11.5–15.5)
WBC: 8 10*3/uL (ref 4.0–10.5)
nRBC: 0 % (ref 0.0–0.2)

## 2019-03-07 LAB — MAGNESIUM: Magnesium: 2.2 mg/dL (ref 1.7–2.4)

## 2019-03-07 MED ORDER — IOHEXOL 300 MG/ML  SOLN
100.0000 mL | Freq: Once | INTRAMUSCULAR | Status: AC | PRN
  Administered 2019-03-07: 09:00:00 100 mL via INTRAVENOUS

## 2019-03-08 LAB — THYROID PANEL WITH TSH
Free Thyroxine Index: 2.5 (ref 1.2–4.9)
T3 Uptake Ratio: 27 % (ref 24–39)
T4, Total: 9.1 ug/dL (ref 4.5–12.0)
TSH: 1.53 u[IU]/mL (ref 0.450–4.500)

## 2019-03-09 ENCOUNTER — Other Ambulatory Visit: Payer: Self-pay

## 2019-03-10 ENCOUNTER — Other Ambulatory Visit: Payer: Self-pay

## 2019-03-10 ENCOUNTER — Inpatient Hospital Stay (HOSPITAL_BASED_OUTPATIENT_CLINIC_OR_DEPARTMENT_OTHER): Payer: PPO | Admitting: Hospice and Palliative Medicine

## 2019-03-10 ENCOUNTER — Encounter: Payer: Self-pay | Admitting: Oncology

## 2019-03-10 ENCOUNTER — Inpatient Hospital Stay (HOSPITAL_BASED_OUTPATIENT_CLINIC_OR_DEPARTMENT_OTHER): Payer: PPO | Admitting: Oncology

## 2019-03-10 VITALS — BP 122/78 | HR 96 | Temp 97.3°F | Wt 108.0 lb

## 2019-03-10 DIAGNOSIS — I129 Hypertensive chronic kidney disease with stage 1 through stage 4 chronic kidney disease, or unspecified chronic kidney disease: Secondary | ICD-10-CM

## 2019-03-10 DIAGNOSIS — Z87891 Personal history of nicotine dependence: Secondary | ICD-10-CM | POA: Diagnosis not present

## 2019-03-10 DIAGNOSIS — Z515 Encounter for palliative care: Secondary | ICD-10-CM | POA: Diagnosis not present

## 2019-03-10 DIAGNOSIS — C760 Malignant neoplasm of head, face and neck: Secondary | ICD-10-CM

## 2019-03-10 DIAGNOSIS — N183 Chronic kidney disease, stage 3 (moderate): Secondary | ICD-10-CM

## 2019-03-10 NOTE — Progress Notes (Signed)
° °  °Palliative Medicine °[facility]  °[phone] [phone] ° ° °Name: Dana Perez °Date: 03/10/2019 °MRN: [id number]  °DOB: 06/10/1941 ° °Patient Care Team: °McLaughlin, Miriam K, MD as PCP - General (Physician Assistant) °Meyers, Michael O, MD as Referring Physician (Surgery)  ° ° °REASON FOR CONSULTATION: °Palliative Care consult requested for this 77 y.o. 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  PMH also includes adenocarcinoma of the colon not currently on treatment, malnutrition status post PEG insertion, she had bilateral ankle fractures causing a marked reduction in her performance status, chronic pain on transdermal fentanyl, hypertension, hyperlipidemia, and CKD stage III.  Patient had disease progression and general decline despite on previous treatment.  Hospice had been discussed but patient/husband opted to continue treatment with nivolumab.  Palliative care is now asked to follow and provide her with support. ° °SOCIAL HISTORY:    ° reports that she quit smoking about 20 months ago. Her smoking use included cigarettes. She smoked 0.25 packs per day. She has never used smokeless tobacco. She reports that she does not drink alcohol or use drugs.  ° °Patient is married.  She lives at home with her husband.  She has no children.  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 ° °ADVANCE DIRECTIVES:  °Patient does not currently have advance directives.  She would want her husband to be her decision-maker.  Patient and husband have talked about completing a living will but have not currently done so. ° °CODE STATUS: DNR ° °PAST MEDICAL HISTORY: °Past Medical History:  °Diagnosis Date  °• Adenocarcinoma of colon (HCC)   °• Benign  neoplasm of ascending colon   °• Essential hypertension 06/13/2014  °• Goals of care, counseling/discussion 07/14/2017  °• HLD (hyperlipidemia) 06/13/2014  °• Hypercholesteremia   °• Hyperlipidemia   °• Multiple thyroid nodules 01/22/2012  °• Osteoporosis   °• Polycythemia, secondary 12/26/2014  °• Pure hypercholesterolemia 03/15/2015  °• Squamous cell carcinoma of head and neck (HCC)   °• Thyroid nodule   ° ° °PAST SURGICAL HISTORY:  °Past Surgical History:  °Procedure Laterality Date  °• ABDOMINAL HYSTERECTOMY    °• COLONOSCOPY WITH PROPOFOL N/A 07/02/2017  ° Procedure: COLONOSCOPY WITH PROPOFOL;  Surgeon: Wohl, Darren, MD;  Location: [facility];  Service: Endoscopy;  Laterality: N/A;  °• ORIF ANKLE FRACTURE Left 08/08/2017  ° Procedure: OPEN REDUCTION INTERNAL FIXATION (ORIF) ANKLE FRACTURE;  Surgeon: Hooten, James P, MD;  Location: [facility];  Service: Orthopedics;  Laterality: Left;  °• POLYPECTOMY N/A 07/02/2017  ° Procedure: POLYPECTOMY;  Surgeon: Wohl, Darren, MD;  Location: [facility];  Service: Endoscopy;  Laterality: N/A;  °• WRIST FRACTURE SURGERY  08/2009  ° badly broken  ° ° °HEMATOLOGY/ONCOLOGY HISTORY:  °Oncology History  °Squamous cell carcinoma of head and neck (HCC)  °07/01/2017 Initial Diagnosis  ° Squamous cell carcinoma of head and neck (HCC) °  °04/08/2018 -  Chemotherapy  ° The patient had nivolumab (OPDIVO) 240 mg in sodium chloride 0.9 % 100 mL chemo infusion, 240 mg, Intravenous, Once, 16 of 20 cycles °Administration: 240 mg (04/08/2018), 240 mg (04/22/2018), 240 mg (05/06/2018), 240 mg (05/20/2018), 240 mg (06/03/2018), 240 mg (06/24/2018), 240 mg (07/08/2018), 240 mg (07/22/2018), 240 mg (08/05/2018), 240 mg (08/19/2018), 240 mg (09/02/2018), 240 mg (09/16/2018), 240 mg (  mg (09/30/2018), 240 mg (10/14/2018), 240 mg (10/28/2018), 240 mg (11/11/2018)  for chemotherapy treatment.      ALLERGIES:  is allergic to potassium; other; and tetanus antitoxin.  MEDICATIONS:  Current Outpatient  Medications  Medication Sig Dispense Refill   cholestyramine (QUESTRAN) 4 GM/DOSE powder Take 1 packet (4 g total) by mouth 3 (three) times daily with meals. 378 g 12   clotrimazole-betamethasone (LOTRISONE) cream APPLY TO AFFECTED AREA TWICE A DAY     Ensure Plus (ENSURE PLUS) LIQD Give 1 carton of ensure plus 5 times daily via feeding tube.  Flush with 27m of water before and after feeding. 1185 mL 0   fentaNYL (DURAGESIC - DOSED MCG/HR) 50 MCG/HR Place 50 mcg onto the skin every 3 (three) days.      ibuprofen (ADVIL,MOTRIN) 200 MG tablet Take 400 mg by mouth every 4 (four) hours as needed.     metoCLOPramide (REGLAN) 10 MG tablet Take 1 tablet (10 mg total) by mouth every 8 (eight) hours as needed for nausea. 90 tablet 0   morphine (ROXANOL) 20 MG/ML concentrated solution Place 0.25 mLs (5 mg total) into feeding tube every 4 (four) hours as needed for moderate pain or severe pain. 30 mL 0   NARCAN 4 MG/0.1ML LIQD nasal spray kit USE 1 SPRAY PER INTRANASAL ROUTE AS DIRECTED  0   Polyethylene Glycol 3350 (PEG 3350) 17 GM/SCOOP POWD Take 17 g by mouth 1 day or 1 dose.     pravastatin (PRAVACHOL) 10 MG tablet Take 10 mg by mouth daily.     prochlorperazine (COMPAZINE) 10 MG tablet Take 1 tablet (10 mg total) by mouth every 6 (six) hours as needed for nausea or vomiting. 60 tablet 2   No current facility-administered medications for this visit.    Facility-Administered Medications Ordered in Other Visits  Medication Dose Route Frequency Provider Last Rate Last Dose   heparin lock flush 100 unit/mL  250 Units Intracatheter PRN PLeia Alf MD       heparin lock flush 100 unit/mL  500 Units Intracatheter PRN PLeia Alf MD       potassium chloride 20 mEq in sodium chloride 0.9 % 500 mL injection   Intravenous Once STye Savoy Doni H, RN       sodium chloride 0.9 % injection 10 mL  10 mL Intracatheter PRN PLeia Alf MD       sodium chloride 0.9 % injection 10 mL  10 mL  Intracatheter PRN PLeia Alf MD       sodium chloride 0.9 % injection 10 mL  10 mL Intracatheter PRN PLeia Alf MD       sodium chloride 0.9 % injection 10 mL  10 mL Intracatheter PRN PLeia Alf MD        VITAL SIGNS: There were no vitals taken for this visit. There were no vitals filed for this visit.  Estimated body mass index is 17.43 kg/m as calculated from the following:   Height as of 06/03/18: 5' 6" (1.676 m).   Weight as of an earlier encounter on 03/10/19: 108 lb (49 kg).  LABS: CBC:    Component Value Date/Time   WBC 8.0 03/07/2019 0807   HGB 16.8 (H) 03/07/2019 0807   HCT 50.4 (H) 03/07/2019 0807   PLT 287 03/07/2019 0807   MCV 91.0 03/07/2019 0807   NEUTROABS 6.3 03/07/2019 0807   LYMPHSABS 1.2 03/07/2019 0807   MONOABS 0.4 03/07/2019 0807   EOSABS 0.1 03/07/2019 0807   BASOSABS 0.0  0807  ° °Comprehensive Metabolic Panel: °   °Component Value Date/Time  ° NA 138 03/07/2019 0807  ° K 4.3 03/07/2019 0807  ° CL 104 03/07/2019 0807  ° CO2 24 03/07/2019 0807  ° BUN 31 (H) 03/07/2019 0807  ° CREATININE 0.91 03/07/2019 0807  ° GLUCOSE 112 (H) 03/07/2019 0807  ° CALCIUM 9.0 03/07/2019 0807  ° AST 24 03/07/2019 0807  ° ALT 23 03/07/2019 0807  ° ALKPHOS 94 03/07/2019 0807  ° BILITOT 0.5 03/07/2019 0807  ° PROT 7.3 03/07/2019 0807  ° ALBUMIN 4.0 03/07/2019 0807  ° ° °RADIOGRAPHIC STUDIES: °Ct Soft Tissue Neck W Contrast ° °Result Date: 03/07/2019 °CLINICAL DATA:  Head neck malignancy EXAM: CT NECK WITH CONTRAST TECHNIQUE: Multidetector CT imaging of the neck was performed using the standard protocol following the bolus administration of intravenous contrast. CONTRAST:  100mL OMNIPAQUE IOHEXOL 300 MG/ML  SOLN COMPARISON:  05/08/2017.  PET-CT 09/13/2018 FINDINGS: Pharynx and larynx: Submucosal low-density most likely from radiotherapy. No masslike findings at the site of patient's oral cavity surgery with right segmental mandibulectomy and pectoralis flap  coverage. No metachronous mass is seen. Mucus/secretions in the oral cavity and oropharynx. Salivary glands: Post treatment changes including right submandibular gland resection Thyroid: Negative Lymph nodes: None enlarged or low-density. Vascular: Heavy atherosclerotic plaque in the cervical carotids and aorta. Limited intracranial: Negative Visualized orbits: Negative Mastoids and visualized paranasal sinuses: Partial right mastoid opacification with negative nasopharynx. Skeleton: Degenerative disease without acute or aggressive finding. Upper chest: Chest CT reported separately. Emphysema. IMPRESSION: Postoperative neck without evidence of recurrence. Electronically Signed   By: Jonathon  Watts M.D.   On: 03/07/2019 10:42  ° °Ct Chest W Contrast ° °Result Date: 03/07/2019 °CLINICAL DATA:  Squamous cell carcinoma of the head/neck EXAM: CT CHEST, ABDOMEN, AND PELVIS WITH CONTRAST TECHNIQUE: Multidetector CT imaging of the chest, abdomen and pelvis was performed following the standard protocol during bolus administration of intravenous contrast. CONTRAST:  100mL OMNIPAQUE IOHEXOL 300 MG/ML  SOLN COMPARISON:  Multiple exams, including PET-CT from 09/13/2018 FINDINGS: CT CHEST FINDINGS Cardiovascular: Coronary, aortic arch, and branch vessel atherosclerotic vascular disease. Moderate pericardial effusion, similar to on the prior PET-CT. Mediastinum/Nodes: No pathologic thoracic adenopathy. Lungs/Pleura: Biapical pleuroparenchymal scarring. The elongated right upper lobe nodularity shown on the prior exam measures 1.3 by 0.2 cm on image 13/5, previously 1.3 by 0.4 cm. Indistinct nodularity along a bandlike density in the right lower lobe measures 0.4 by 0.3 cm on image 38/5, previously indistinct but about 0.6 by 0.4 cm. Linear density slightly more caudad in the right lower lobe on image 42/5 measures 0.5 by 0.2 cm, stable. Sub solid nodule 0.4 cm in diameter on image 23/5, stable, with adjacent calcification along  its anterior margin. Centrilobular emphysema. Musculoskeletal: Unremarkable CT ABDOMEN PELVIS FINDINGS Hepatobiliary: Unremarkable Pancreas: Unremarkable Spleen: Unremarkable Adrenals/Urinary Tract: Unremarkable Stomach/Bowel: Peg tube noted. Vascular/Lymphatic: Aortoiliac atherosclerotic vascular disease. No pathologic adenopathy identified. Reproductive: Uterus absent.  Adnexa unremarkable. Other: No supplemental non-categorized findings. Musculoskeletal: Unremarkable IMPRESSION: 1. Reduced size of the bandlike right upper lobe nodule, previously 1.3 by 0.4 cm and currently 1.3 by 0.2 cm. This lesion previously had low-grade metabolic activity with maximum SUV 2.0. 2. Reduced size of 1 of the 2 bandlike densities in the right lower lobe. Stable sub solid 0.4 cm nodule in the right upper lobe. 3. The stability and reduction in size of these nodules is reassuring, but surveillance is likely warranted. 4. Other imaging findings of potential clinical significance: Aortic Atherosclerosis (ICD10-I70.0)   and Emphysema (ICD10-J43.9). Coronary atherosclerosis. Stable moderate pericardial effusion. Peg tube noted. Electronically Signed   By: Walter  Liebkemann M.D.   On: 03/07/2019 10:40  ° °Ct Abdomen Pelvis W Contrast ° °Result Date: 03/07/2019 °CLINICAL DATA:  Squamous cell carcinoma of the head/neck EXAM: CT CHEST, ABDOMEN, AND PELVIS WITH CONTRAST TECHNIQUE: Multidetector CT imaging of the chest, abdomen and pelvis was performed following the standard protocol during bolus administration of intravenous contrast. CONTRAST:  100mL OMNIPAQUE IOHEXOL 300 MG/ML  SOLN COMPARISON:  Multiple exams, including PET-CT from 09/13/2018 FINDINGS: CT CHEST FINDINGS Cardiovascular: Coronary, aortic arch, and branch vessel atherosclerotic vascular disease. Moderate pericardial effusion, similar to on the prior PET-CT. Mediastinum/Nodes: No pathologic thoracic adenopathy. Lungs/Pleura: Biapical pleuroparenchymal scarring. The elongated  right upper lobe nodularity shown on the prior exam measures 1.3 by 0.2 cm on image 13/5, previously 1.3 by 0.4 cm. Indistinct nodularity along a bandlike density in the right lower lobe measures 0.4 by 0.3 cm on image 38/5, previously indistinct but about 0.6 by 0.4 cm. Linear density slightly more caudad in the right lower lobe on image 42/5 measures 0.5 by 0.2 cm, stable. Sub solid nodule 0.4 cm in diameter on image 23/5, stable, with adjacent calcification along its anterior margin. Centrilobular emphysema. Musculoskeletal: Unremarkable CT ABDOMEN PELVIS FINDINGS Hepatobiliary: Unremarkable Pancreas: Unremarkable Spleen: Unremarkable Adrenals/Urinary Tract: Unremarkable Stomach/Bowel: Peg tube noted. Vascular/Lymphatic: Aortoiliac atherosclerotic vascular disease. No pathologic adenopathy identified. Reproductive: Uterus absent.  Adnexa unremarkable. Other: No supplemental non-categorized findings. Musculoskeletal: Unremarkable IMPRESSION: 1. Reduced size of the bandlike right upper lobe nodule, previously 1.3 by 0.4 cm and currently 1.3 by 0.2 cm. This lesion previously had low-grade metabolic activity with maximum SUV 2.0. 2. Reduced size of 1 of the 2 bandlike densities in the right lower lobe. Stable sub solid 0.4 cm nodule in the right upper lobe. 3. The stability and reduction in size of these nodules is reassuring, but surveillance is likely warranted. 4. Other imaging findings of potential clinical significance: Aortic Atherosclerosis (ICD10-I70.0) and Emphysema (ICD10-J43.9). Coronary atherosclerosis. Stable moderate pericardial effusion. Peg tube noted. Electronically Signed   By: Walter  Liebkemann M.D.   On: 03/07/2019 10:40  ° ° °PERFORMANCE STATUS (ECOG) : 3 - Symptomatic, >50% confined to bed ° °Review of Systems °Unless otherwise noted, a complete review of systems is negative. ° °Physical Exam °General: NAD, frail appearing, thin °Pulmonary: Unlabored °Abdomen: PEG noted but not  visualized °Extremities: no edema, no joint deformities °Skin: no rashes °Neurological: Weakness but otherwise nonfocal ° °IMPRESSION: °Routine follow-up visit today.  Patient also met with Dr. Finnegan. ° °Results of CT scan of chest/abdomen/pelvis/neck on 03/07/2019 showed disease improvement with reduction in size of right upper lobe nodule and no evidence of recurrence with neck mass. ° °Patient reports doing reasonably well without any acute distressing symptoms.  Pain is stable with PRN morphine.  Her nausea, vomiting, and diarrhea have improved. ° °I met with patient's husband. He is happy with the results of the CT scan. Plan is for follow-up imaging in three months.  ° °PLAN: °-Continue current scope of treatment °-RTC in 3 months ° ° °Patient expressed understanding and was in agreement with this plan. She also understands that She can call the clinic at any time with any questions, concerns, or complaints.  ° ° ° °Time Total: 20 minutes ° °Visit consisted of counseling and education dealing with the complex and emotionally intense issues of symptom management and palliative care in the setting of serious and potentially life-threatening   life-threatening illness.Greater than 50%  of this time was spent counseling and coordinating care related to the above assessment and plan.  Signed by: Altha Harm, PhD, NP-C [phone] (Work Cell)

## 2019-03-10 NOTE — Progress Notes (Signed)
Patient stated that she had been doing well with no complaints. 

## 2019-03-11 ENCOUNTER — Other Ambulatory Visit: Payer: Self-pay

## 2019-03-14 ENCOUNTER — Inpatient Hospital Stay: Payer: PPO

## 2019-03-14 ENCOUNTER — Other Ambulatory Visit: Payer: Self-pay

## 2019-03-14 NOTE — Progress Notes (Signed)
Nutrition Follow-up:  Patient with head and neck cancer stage IV and adenocarcinoma of colon.  Patient followed by Dr. Grayland Ormond and Merrily Pew, Palliative Care, NP.  Patient currently not receiving treatment.  G-[facility] placed on 6/7 at [facility].  Spoke with husband via phone this pm for nutrition follow-up.  Husband reports that patient is doing better.  Reports that they started the metoclopramide and wife had diarrhea so stopped.  Then reports issues with constipation and took miralax for 2 days to help relieve constipation.  Husband reports that he cut feeding back during this time.    Husband reports planning to go back to Dr. Pryor Ochoa, ENT to help with excess skin in mouth and throat.  Husband pleased with CT scan results.        Medications: reviewed  Labs: reviewed  Anthropometrics:   Weight 108 lb on 7/23.  Husband reports that patient is back up to 110 lb on home scale.     Estimated Energy Needs  Kcals: 1250-1500 calories Protein: 62-75 g Fluid: 1.5 L  NUTRITION DIAGNOSIS: Inadequate oral intake continues but relying on feeding tube   INTERVENTION:  Continue ensure plus 5 cartons per day.  Husband spacing tube feeding out q 3-3 1/2 hours between feedings.   Husband using medications to help with symptom management    MONITORING, EVALUATION, GOAL: weight trends, TF tolerance   NEXT VISIT: phone f/u August 31  Johathon Overturf B. Zenia Resides, [place], [place] Registered Dietitian [phone] (pager)

## 2019-03-15 DIAGNOSIS — C189 Malignant neoplasm of colon, unspecified: Secondary | ICD-10-CM | POA: Diagnosis not present

## 2019-03-15 DIAGNOSIS — C760 Malignant neoplasm of head, face and neck: Secondary | ICD-10-CM | POA: Diagnosis not present

## 2019-03-24 ENCOUNTER — Ambulatory Visit: Payer: PPO | Admitting: Radiation Oncology

## 2019-04-13 DIAGNOSIS — C189 Malignant neoplasm of colon, unspecified: Secondary | ICD-10-CM | POA: Diagnosis not present

## 2019-04-13 DIAGNOSIS — C760 Malignant neoplasm of head, face and neck: Secondary | ICD-10-CM | POA: Diagnosis not present

## 2019-04-15 ENCOUNTER — Other Ambulatory Visit: Payer: Self-pay

## 2019-04-18 ENCOUNTER — Inpatient Hospital Stay: Payer: PPO | Attending: Oncology

## 2019-04-18 NOTE — Progress Notes (Signed)
Nutrition Follow-up:  Patient with head and neck cancer stage IV and adenocarcinoma of colon.  Patient followed by Dr. Grayland Ormond and Merrily Pew, Palliative Care, NP.  Patient not currently receiving treatment.  G-tube was placed on 6/7 at [facility].  Spoke with husband via phone this pm for nutrition follow-up.  Husband reports that patient is tolerating tube feeding.  Patient is typically getting about 5 cartons of ensure plus daily.  Patient is spacing this out among 5 feedings (7am, 10, 1pm, 4pm and 7pm).  Overall bowels are moving well did have 1 episode of diarrhea but corrected with questran.  Continues to give compazine for nausea.  Husband thinks this is related to skin in throat.  Reports that patient has been sleeping more during the day and staying awake at night.  He has tried to have her get up during the day and walk and get her outside on the patio to help with her sleeping better at night.      Medications: reviewed  Labs: no new  Anthropometrics:   Husband reports staying around 110 lb on home scale   Estimated Energy Needs  Kcals: 1250-1500 calories Protein: 62-75 g Fluid: 1.5 L  NUTRITION DIAGNOSIS: Inadequate oral intake continues but relying on tube feeding    INTERVENTION:  Continue ensure plus 5 per day. Husband to continue to space feedings out at least 3 hours apart.   Husband reports has supplies and formula from DME.   Husband can contact me with questions    MONITORING, EVALUATION, GOAL: weight trends, TF tolerance   NEXT VISIT: October 22 after Palliative care  Loy Little B. Zenia Resides, [place], [place] Registered Dietitian [phone] (pager)

## 2019-04-19 ENCOUNTER — Telehealth: Payer: Self-pay | Admitting: Hospice and Palliative Medicine

## 2019-04-19 NOTE — Telephone Encounter (Signed)
I received a call from patient's husband. He says that he has discontinued the fentanyl and morphine as patient seemed to be more comfortable and in less pain. He was concerned about the potential for side effects from being on chronic opioids. He has noticed increased agitation. We talked about withdrawal from opioids and how we generally will taper doses with plan to discontinue. He does not want to taper or resume medications at this time.   We also discussed management of anxiety/agitation with use of other medications (I.e. antidepressants) and he wanted to think about options.   He also wanted to talk about how he feels that the RT has severely impacted her quality of life over time.   I provided empathetic listening and attempted to answer all of his questions to his satisfaction.

## 2019-04-20 ENCOUNTER — Telehealth: Payer: Self-pay | Admitting: *Deleted

## 2019-04-20 NOTE — Telephone Encounter (Signed)
I called patient's husband but did not reach him. Message left.

## 2019-04-20 NOTE — Telephone Encounter (Signed)
I got in communication with Altha Harm, NP and he stated that he would call patient's husband to discuss below.

## 2019-04-20 NOTE — Telephone Encounter (Signed)
Husband called reporting that patient is worse today and that her diarrhea is bad and the Lucrezia Starch is not controlling it. Asking for Sharion Dove, NP to call to see what else can be done for her diarrhea

## 2019-04-21 ENCOUNTER — Telehealth: Payer: Self-pay | Admitting: *Deleted

## 2019-04-21 ENCOUNTER — Telehealth: Payer: Self-pay | Admitting: Hospice and Palliative Medicine

## 2019-04-21 NOTE — Telephone Encounter (Signed)
Dana Perez has called again this morning asking you call this morning. Dana Perez is vomiting and has siarrhea, He is asking if her med can be changed or if he needs to take her to ER

## 2019-04-21 NOTE — Telephone Encounter (Signed)
I received another call from patient's husband.  He says that patient has been having multiple episodes of diarrhea without fever or chills.  No abdominal pain reported.  She seems to be tolerating tube feeds.  Has occasional nausea but that is chronic.  Symptoms seem to have started when has been abruptly discontinued all the opioids.  I am suspicious that the diarrhea could be secondary to opioid withdrawal.  He agreed to restart low-dose morphine to see if that improves her symptoms.  Would then work with husband to taper opioids if that is still his preference.  He also asked about patient's sleep pattern.  Apparently she is napping more during the day and staying awake at night.  We discussed good sleep hygiene and encouraged him to minimize daytime napping and adhere to a bedtime routine.  Would consider sleep aid if necessary.

## 2019-05-02 ENCOUNTER — Telehealth: Payer: Self-pay | Admitting: *Deleted

## 2019-05-02 NOTE — Telephone Encounter (Signed)
Mr Dana Perez called reporting that the Fentanyl patch is causing sever itching to Dana Perez and he has  Tried multiple time to lotion her down and she is going crazy with itching. He also reports that she needs her MS refilled. He requests a return call form Sharion Dove, NP [phone]

## 2019-05-03 ENCOUNTER — Inpatient Hospital Stay: Payer: PPO | Attending: Hospice and Palliative Medicine | Admitting: Hospice and Palliative Medicine

## 2019-05-03 DIAGNOSIS — Z515 Encounter for palliative care: Secondary | ICD-10-CM | POA: Diagnosis not present

## 2019-05-03 DIAGNOSIS — G893 Neoplasm related pain (acute) (chronic): Secondary | ICD-10-CM | POA: Diagnosis not present

## 2019-05-03 DIAGNOSIS — K9423 Gastrostomy malfunction: Secondary | ICD-10-CM | POA: Diagnosis not present

## 2019-05-03 MED ORDER — FENTANYL 25 MCG/HR TD PT72: 1.0000 | MEDICATED_PATCH | TRANSDERMAL | 0 refills | Status: DC

## 2019-05-03 MED ORDER — MORPHINE SULFATE (CONCENTRATE) 20 MG/ML PO SOLN: 5.0000 mg | ORAL | 0 refills | Status: DC | PRN

## 2019-05-03 NOTE — Telephone Encounter (Signed)
I tried calling husband but did not reach him. Message left.  

## 2019-05-03 NOTE — Progress Notes (Signed)
Virtual Visit via Telephone Note  I connected with Dana Perez on 05/03/19 at  3:45 PM EDT by telephone and verified that I am speaking with the correct person using two identifiers.   I discussed the limitations, risks, security and privacy concerns of performing an evaluation and management service by telephone and the availability of in person appointments. I also discussed with the patient that there may be a patient responsible charge related to this service. The patient expressed understanding and agreed to proceed.   History of Present Illness: Palliative Care consult requested for this 78 y.o. 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  PMH also includes adenocarcinoma of the colon not currently on treatment, malnutrition status post PEG insertion, she had recent bilateral ankle fractures causing a marked reduction in her performance status, chronic pain on transdermal fentanyl, hypertension, hyperlipidemia, and CKD stage III.  Patient has had disease progression and general decline despite ongoing treatment of her head & neck cancer.  Hospice had been discussed but patient/husband opted to continue treatment with nivolumab.  Palliative care is now asked to follow and provide her with support.   Observations/Objective: Spoke with patient's husband by phone.  Patient was present and unable to engage in conversation due to her head and neck cancer.  Patient's husband reports that she has had pruritus without rash over the past week.  He attributes it to restarting the fentanyl although he says patient she has had similar issues chronically.  The pruritus has been intermittent and seems to improve temporarily with moisturizing lotion.  Husband denies any rash, fevers, chills, night sweats, cough or infectious  symptoms.  Husband says that patient's diarrhea and nervousness resolved quickly after resuming opioids.  Patient is otherwise doing well without any significant changes.  Assessment/Plan:  1. Neoplasm related pain - refill morphine. Reduce dose of fentanyl to 57mcg. Plan to start weaning opioids.   2. Pruritis - suspect dry skin. Recommended moisturizing emollient cream. Can see in clinic if symptoms persist. .   Case and plan discussed with Dr. Grayland Ormond.   Follow Up Instructions: I offered follow-up  clinic visit as needed in the event of symptoms worsening.    I discussed the assessment and treatment plan with the patient. The patient was provided an opportunity to ask questions and all were answered. The patient agreed with the plan and demonstrated an understanding of the instructions.   The patient was advised to call back or seek an in-person evaluation if the symptoms worsen or if the condition fails to improve as anticipated.  I provided 10 minutes of non-face-to-face time during this encounter.   Irean Hong, NP

## 2019-05-06 ENCOUNTER — Telehealth: Payer: Self-pay | Admitting: *Deleted

## 2019-05-06 NOTE — Telephone Encounter (Signed)
Mr [facility] called Answering service to report that Dana Perez fell and he called EMS and they would not take her to the hospital due to [place]. He requests a return call [phone]

## 2019-05-06 NOTE — Telephone Encounter (Signed)
Spoke with husband by phone.  He says that patient had a fall in the bathroom.  He called EMS but was advised that she did not appear to have any acute injuries and vitals were stable.  Husband opted not to send her to the ER due to concerns that she would contract COVID.  He says he still not interested in sending her to the ER unless she were to acutely decline.  I offered a clinic visit early next week but he says he wants to see how she does over the weekend and will let us know if he thinks a visit as needed.  He says the patient is weak but mostly back to normal.  He denies fever or chills.  Denies bleeding.  Denies cough or congestion.  Denies other acute changes.

## 2019-05-09 ENCOUNTER — Other Ambulatory Visit: Payer: Self-pay

## 2019-05-09 ENCOUNTER — Inpatient Hospital Stay: Payer: PPO

## 2019-05-09 ENCOUNTER — Telehealth: Payer: Self-pay | Admitting: *Deleted

## 2019-05-09 ENCOUNTER — Inpatient Hospital Stay (HOSPITAL_BASED_OUTPATIENT_CLINIC_OR_DEPARTMENT_OTHER): Payer: PPO | Admitting: Hospice and Palliative Medicine

## 2019-05-09 VITALS — BP 139/86 | HR 91 | Temp 98.4°F | Resp 20

## 2019-05-09 DIAGNOSIS — C760 Malignant neoplasm of head, face and neck: Secondary | ICD-10-CM | POA: Insufficient documentation

## 2019-05-09 DIAGNOSIS — R55 Syncope and collapse: Secondary | ICD-10-CM

## 2019-05-09 DIAGNOSIS — R531 Weakness: Secondary | ICD-10-CM

## 2019-05-09 DIAGNOSIS — R42 Dizziness and giddiness: Secondary | ICD-10-CM | POA: Diagnosis not present

## 2019-05-09 LAB — COMPREHENSIVE METABOLIC PANEL
ALT: 20 U/L (ref 0–44)
AST: 25 U/L (ref 15–41)
Albumin: 3.9 g/dL (ref 3.5–5.0)
Alkaline Phosphatase: 99 U/L (ref 38–126)
Anion gap: 9 (ref 5–15)
BUN: 30 mg/dL — ABNORMAL HIGH (ref 8–23)
CO2: 26 mmol/L (ref 22–32)
Calcium: 9.2 mg/dL (ref 8.9–10.3)
Chloride: 105 mmol/L (ref 98–111)
Creatinine, Ser: 0.94 mg/dL (ref 0.44–1.00)
GFR calc Af Amer: >60 mL/min (ref >60)
GFR calc non Af Amer: 59 mL/min — ABNORMAL LOW (ref >60)
Glucose, Bld: 108 mg/dL — ABNORMAL HIGH (ref 70–99)
Potassium: 4.3 mmol/L (ref 3.5–5.1)
Sodium: 140 mmol/L (ref 135–145)
Total Bilirubin: 0.3 mg/dL (ref 0.3–1.2)
Total Protein: 7.4 g/dL (ref 6.5–8.1)

## 2019-05-09 LAB — CBC WITH DIFFERENTIAL/PLATELET
Abs Immature Granulocytes: 0.02 10*3/uL (ref 0.00–0.07)
Basophils Absolute: 0 10*3/uL (ref 0.0–0.1)
Basophils Relative: 0 %
Eosinophils Absolute: 0.1 10*3/uL (ref 0.0–0.5)
Eosinophils Relative: 1 %
HCT: 47.5 % — ABNORMAL HIGH (ref 36.0–46.0)
Hemoglobin: 15.7 g/dL — ABNORMAL HIGH (ref 12.0–15.0)
Immature Granulocytes: 0 %
Lymphocytes Relative: 17 %
Lymphs Abs: 1.4 10*3/uL (ref 0.7–4.0)
MCH: 30.2 pg (ref 26.0–34.0)
MCHC: 33.1 g/dL (ref 30.0–36.0)
MCV: 91.3 fL (ref 80.0–100.0)
Monocytes Absolute: 0.5 10*3/uL (ref 0.1–1.0)
Monocytes Relative: 6 %
Neutro Abs: 6.1 10*3/uL (ref 1.7–7.7)
Neutrophils Relative %: 76 %
Platelets: 281 10*3/uL (ref 150–400)
RBC: 5.2 MIL/uL — ABNORMAL HIGH (ref 3.87–5.11)
RDW: 11.9 % (ref 11.5–15.5)
WBC: 8.1 10*3/uL (ref 4.0–10.5)
nRBC: 0 % (ref 0.0–0.2)

## 2019-05-09 MED ORDER — SODIUM CHLORIDE 0.9 % IV SOLN
Freq: Once | INTRAVENOUS | Status: AC
  Administered 2019-05-09: 15:00:00 via INTRAVENOUS
  Filled 2019-05-09: qty 250

## 2019-05-09 NOTE — Telephone Encounter (Signed)
[name] called reporting that Dana Perez is worse and is fainting and fell over whilst in the tub. She is lightheaded and he wants her to be seen. Please advise

## 2019-05-09 NOTE — Telephone Encounter (Signed)
Discussed with Sharion Dove, NP husband accepts appointment for 145 lab, 2 pm smc

## 2019-05-09 NOTE — Progress Notes (Signed)
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  She says that the dizziness has been persistent even while lying down but is worse when she moves her head.  Dizziness is also been exacerbated by position changes.  Patient fell last week but did not sustain injury.  Husband does not think that patient had complete loss of consciousness.  No changes in volume intake through PEG.  No fevers or chills.  No unilateral weakness.  No speech changes.  No other neurological changes.   Denies recent fevers or illnesses. Denies any easy bleeding or bruising. Reports good appetite and denies weight loss. Denies chest pain. Denies any nausea, vomiting, constipation, or diarrhea. Denies urinary complaints. Patient offers no further specific complaints today.  ECOG FS:2 - Symptomatic, <50% confined to bed  Review of systems- Review of Systems  Constitutional: Negative for chills, fever and malaise/fatigue.  HENT: Negative for congestion, ear discharge, ear pain, hearing loss, nosebleeds, sore throat and tinnitus.   Eyes: Negative for blurred vision and double vision.  Respiratory: Negative for cough and shortness of breath.   Cardiovascular: Negative.  Negative for chest pain, palpitations, orthopnea and leg swelling.  Gastrointestinal: Negative for abdominal pain, constipation, diarrhea, nausea and vomiting.  Genitourinary: Negative for dysuria, frequency and urgency.  Musculoskeletal: Negative  for myalgias.  Neurological: Positive for dizziness and weakness. Negative for tingling, tremors, sensory change, speech change, focal weakness, seizures, loss of consciousness and headaches.     Current treatment-surveillance  Allergies  Allergen Reactions  . Potassium Nausea Only and Nausea And Vomiting  . Other Other (See Comments) and Rash    TDAP tdap TDAP  . Tetanus Antitoxin Rash    Past Medical History:  Diagnosis Date  . Adenocarcinoma of colon ([place])   . Benign neoplasm of ascending colon   . Essential hypertension 06/13/2014  . Goals of care, counseling/discussion 07/14/2017  . HLD (hyperlipidemia) 06/13/2014  . Hypercholesteremia   . Hyperlipidemia   . Multiple thyroid nodules 01/22/2012  . Osteoporosis   . Polycythemia, secondary 12/26/2014  . Pure hypercholesterolemia 03/15/2015  . Squamous cell carcinoma of head and neck ([place])   . Thyroid nodule     Past Surgical History:  Procedure Laterality Date  . ABDOMINAL HYSTERECTOMY    . COLONOSCOPY WITH PROPOFOL N/A 07/02/2017   Procedure: COLONOSCOPY WITH PROPOFOL;  Surgeon: Lucilla Lame, MD;  Location: [facility];  Service: Endoscopy;  Laterality: N/A;  . ORIF ANKLE FRACTURE Left 08/08/2017   Procedure: OPEN REDUCTION INTERNAL FIXATION (ORIF) ANKLE FRACTURE;  Surgeon: Dereck Leep, MD;  Location: [facility];  Service: Orthopedics;  Laterality: Left;  . POLYPECTOMY N/A 07/02/2017   Procedure: POLYPECTOMY;  Surgeon: Lucilla Lame, MD;  Location: [facility];  Service: Endoscopy;  Laterality: N/A;  . WRIST FRACTURE SURGERY  08/2009   badly broken    Social History   Socioeconomic History  . Marital status: Married    Spouse name: Not on file  . Number of children: Not on file  . Years of education:  Not on file  . Highest education level: Not on file  Occupational History  . Not on file  Social Needs  . Financial resource strain: Patient refused  . Food insecurity    Worry: Patient refused     Inability: Patient refused  . Transportation needs    Medical: Patient refused    Non-medical: Patient refused  Tobacco Use  . Smoking status: Former Smoker    Packs/day: 0.25    Types: Cigarettes    Quit date: 06/17/2017    Years since quitting: 1.8  . Smokeless tobacco: Never Used  Substance and Sexual Activity  . Alcohol use: No  . Drug use: No  . Sexual activity: Not Currently  Lifestyle  . Physical activity    Days per week: Patient refused    Minutes per session: Patient refused  . Stress: Patient refused  Relationships  . Social Herbalist on phone: Patient refused    Gets together: Patient refused    Attends religious service: Patient refused    Active member of club or organization: Patient refused    Attends meetings of clubs or organizations: Patient refused    Relationship status: Patient refused  . Intimate partner violence    Fear of current or ex partner: Patient refused    Emotionally abused: Patient refused    Physically abused: Patient refused    Forced sexual activity: Patient refused  Other Topics Concern  . Not on file  Social History Narrative  . Not on file    Family History  Problem Relation Age of Onset  . Heart disease Mother   . Diabetes Father      Current Outpatient Medications:  .  cholestyramine (QUESTRAN) 4 GM/DOSE powder, Take 1 packet (4 g total) by mouth 3 (three) times daily with meals., Disp: 378 g, Rfl: 12 .  clotrimazole-betamethasone (LOTRISONE) cream, APPLY TO AFFECTED AREA TWICE A DAY, Disp: , Rfl:  .  Ensure Plus (ENSURE PLUS) LIQD, Give 1 carton of ensure plus 5 times daily via feeding tube.  Flush with 29m of water before and after feeding., Disp: 1185 mL, Rfl: 0 .  fentaNYL (DURAGESIC) 25 MCG/HR, Place 1 patch onto the skin every 3 (three) days., Disp: 5 patch, Rfl: 0 .  ibuprofen (ADVIL,MOTRIN) 200 MG tablet, Take 400 mg by mouth every 4 (four) hours as needed., Disp: , Rfl:  .  metoCLOPramide (REGLAN) 10  MG tablet, Take 1 tablet (10 mg total) by mouth every 8 (eight) hours as needed for nausea., Disp: 90 tablet, Rfl: 0 .  morphine (ROXANOL) 20 MG/ML concentrated solution, Place 0.25 mLs (5 mg total) into feeding tube every 4 (four) hours as needed for moderate pain or severe pain., Disp: 30 mL, Rfl: 0 .  NARCAN 4 MG/0.1ML LIQD nasal spray kit, USE 1 SPRAY PER INTRANASAL ROUTE AS DIRECTED, Disp: , Rfl: 0 .  Polyethylene Glycol 3350 (PEG 3350) 17 GM/SCOOP POWD, Take 17 g by mouth 1 day or 1 dose., Disp: , Rfl:  .  pravastatin (PRAVACHOL) 10 MG tablet, Take 10 mg by mouth daily., Disp: , Rfl:  .  prochlorperazine (COMPAZINE) 10 MG tablet, Take 1 tablet (10 mg total) by mouth every 6 (six) hours as needed for nausea or vomiting., Disp: 60 tablet, Rfl: 2 No current facility-administered medications for this visit.   Facility-Administered Medications Ordered in Other Visits:  .  0.9 %  sodium chloride infusion, , Intravenous, Once, Metta Koranda, JKirt Boys NP, Last Rate: 999  mL/hr at 05/09/19 1501 .  heparin lock flush 100 unit/mL, 250 Units, Intracatheter, PRN, Ma Hillock, Sandeep, MD .  heparin lock flush 100 unit/mL, 500 Units, Intracatheter, PRN, Ma Hillock, Sandeep, MD .  potassium chloride 20 mEq in sodium chloride 0.9 % 500 mL injection, , Intravenous, Once, Tye Savoy, Doni H, RN .  sodium chloride 0.9 % injection 10 mL, 10 mL, Intracatheter, PRN, Ma Hillock, Sandeep, MD .  sodium chloride 0.9 % injection 10 mL, 10 mL, Intracatheter, PRN, Ma Hillock, Sandeep, MD .  sodium chloride 0.9 % injection 10 mL, 10 mL, Intracatheter, PRN, Ma Hillock, Sandeep, MD .  sodium chloride 0.9 % injection 10 mL, 10 mL, Intracatheter, PRN, Leia Alf, MD  Physical exam:  Vitals:   05/09/19 1415  BP: 139/86  Pulse: 91  Resp: 20  Temp: 98.4 F (36.9 C)  TempSrc: Tympanic  SpO2: 95%   Physical Exam Constitutional:      Appearance: Normal appearance.  HENT:     Nose: Nose normal.     Mouth/Throat:     Mouth: Mucous membranes  are dry.  Cardiovascular:     Rate and Rhythm: Normal rate and regular rhythm.  Pulmonary:     Effort: Pulmonary effort is normal.     Breath sounds: Normal breath sounds.  Abdominal:     General: Abdomen is flat.     Palpations: Abdomen is soft.     Comments: PEG  Skin:    General: Skin is warm and dry.  Neurological:     Mental Status: She is alert and oriented to person, place, and time. Mental status is at baseline.     Cranial Nerves: No cranial nerve deficit.     Sensory: No sensory deficit.     Comments: Generalized weakness       CMP Latest Ref Rng & Units 05/09/2019  Glucose 70 - 99 mg/dL 108(H)  BUN 8 - 23 mg/dL 30(H)  Creatinine 0.44 - 1.00 mg/dL 0.94  Sodium 135 - 145 mmol/L 140  Potassium 3.5 - 5.1 mmol/L 4.3  Chloride 98 - 111 mmol/L 105  CO2 22 - 32 mmol/L 26  Calcium 8.9 - 10.3 mg/dL 9.2  Total Protein 6.5 - 8.1 g/dL 7.4  Total Bilirubin 0.3 - 1.2 mg/dL 0.3  Alkaline Phos 38 - 126 U/L 99  AST 15 - 41 U/L 25  ALT 0 - 44 U/L 20   CBC Latest Ref Rng & Units 05/09/2019  WBC 4.0 - 10.5 K/uL 8.1  Hemoglobin 12.0 - 15.0 g/dL 15.7(H)  Hematocrit 36.0 - 46.0 % 47.5(H)  Platelets 150 - 400 K/uL 281    No images are attached to the encounter.  No results found.  Assessment and plan- Patient is a 78 y.o. 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   Vertigo -appears to be positional.  ?BBPV but cannot rule out central causes, particularly with history of H&N cancer. However, there is no evidence that this is cancer related at this point.  Patient does not appear to be orthostatic.  No significant changes on labs today.  No clear indication regarding cardiac etiology. Will give 500 mL IV fluids to see if it improves patient's weakness.  Could consider home health if needed. Discussed possible work-up  with patient and husband.  As patient has established ENT, will refer to them for further work-up.  ENT appointment scheduled for  9/22.  Case and plan discussed with Dr. Grayland Ormond    Visit Diagnosis 1. Squamous cell carcinoma of head and neck (HCC)   2. Vertigo     Patient expressed understanding and was in agreement with this plan. She also understands that She can call clinic at any time with any questions, concerns, or complaints.   Thank you for allowing me to participate in the care of this very pleasant patient.   Time Total: 30 minutes  Visit consisted of counseling and education dealing with the complex and emotionally intense issues of symptom management and palliative care in the setting of serious and potentially life-threatening illness.Greater than 50%  of this time was spent counseling and coordinating care related to the above assessment and plan.  Signed by: Altha Harm, PhD, NP-C [phone] (Work Cell)

## 2019-05-11 ENCOUNTER — Other Ambulatory Visit: Payer: Self-pay | Admitting: Otolaryngology

## 2019-05-11 DIAGNOSIS — H6121 Impacted cerumen, right ear: Secondary | ICD-10-CM | POA: Diagnosis not present

## 2019-05-11 DIAGNOSIS — C148 Malignant neoplasm of overlapping sites of lip, oral cavity and pharynx: Secondary | ICD-10-CM | POA: Diagnosis not present

## 2019-05-11 DIAGNOSIS — R42 Dizziness and giddiness: Secondary | ICD-10-CM | POA: Diagnosis not present

## 2019-05-11 DIAGNOSIS — H903 Sensorineural hearing loss, bilateral: Secondary | ICD-10-CM | POA: Diagnosis not present

## 2019-05-16 ENCOUNTER — Other Ambulatory Visit: Payer: Self-pay

## 2019-05-16 ENCOUNTER — Encounter: Payer: Self-pay | Admitting: Emergency Medicine

## 2019-05-16 ENCOUNTER — Ambulatory Visit: Payer: PPO

## 2019-05-16 ENCOUNTER — Inpatient Hospital Stay
Admission: EM | Admit: 2019-05-16 | Discharge: 2019-05-18 | DRG: 377 | Disposition: A | Discharge status: Home Health | Payer: PPO | Attending: Internal Medicine | Admitting: Internal Medicine

## 2019-05-16 ENCOUNTER — Telehealth: Payer: Self-pay | Admitting: *Deleted

## 2019-05-16 DIAGNOSIS — Z87891 Personal history of nicotine dependence: Secondary | ICD-10-CM

## 2019-05-16 DIAGNOSIS — Z887 Allergy status to serum and vaccine status: Secondary | ICD-10-CM

## 2019-05-16 DIAGNOSIS — Z66 Do not resuscitate: Secondary | ICD-10-CM | POA: Diagnosis present

## 2019-05-16 DIAGNOSIS — Z931 Gastrostomy status: Secondary | ICD-10-CM

## 2019-05-16 DIAGNOSIS — E7800 Pure hypercholesterolemia, unspecified: Secondary | ICD-10-CM | POA: Diagnosis present

## 2019-05-16 DIAGNOSIS — Z9221 Personal history of antineoplastic chemotherapy: Secondary | ICD-10-CM | POA: Diagnosis not present

## 2019-05-16 DIAGNOSIS — N183 Chronic kidney disease, stage 3 (moderate): Secondary | ICD-10-CM | POA: Diagnosis not present

## 2019-05-16 DIAGNOSIS — Z79899 Other long term (current) drug therapy: Secondary | ICD-10-CM | POA: Diagnosis not present

## 2019-05-16 DIAGNOSIS — M810 Age-related osteoporosis without current pathological fracture: Secondary | ICD-10-CM | POA: Diagnosis present

## 2019-05-16 DIAGNOSIS — Z20828 Contact with and (suspected) exposure to other viral communicable diseases: Secondary | ICD-10-CM | POA: Diagnosis present

## 2019-05-16 DIAGNOSIS — E43 Unspecified severe protein-calorie malnutrition: Secondary | ICD-10-CM | POA: Diagnosis present

## 2019-05-16 DIAGNOSIS — I129 Hypertensive chronic kidney disease with stage 1 through stage 4 chronic kidney disease, or unspecified chronic kidney disease: Secondary | ICD-10-CM | POA: Diagnosis not present

## 2019-05-16 DIAGNOSIS — Z888 Allergy status to other drugs, medicaments and biological substances status: Secondary | ICD-10-CM

## 2019-05-16 DIAGNOSIS — Z8249 Family history of ischemic heart disease and other diseases of the circulatory system: Secondary | ICD-10-CM

## 2019-05-16 DIAGNOSIS — K920 Hematemesis: Secondary | ICD-10-CM

## 2019-05-16 DIAGNOSIS — K254 Chronic or unspecified gastric ulcer with hemorrhage: Secondary | ICD-10-CM | POA: Diagnosis not present

## 2019-05-16 DIAGNOSIS — C760 Malignant neoplasm of head, face and neck: Secondary | ICD-10-CM | POA: Diagnosis not present

## 2019-05-16 DIAGNOSIS — Z85828 Personal history of other malignant neoplasm of skin: Secondary | ICD-10-CM

## 2019-05-16 DIAGNOSIS — R54 Age-related physical debility: Secondary | ICD-10-CM | POA: Diagnosis not present

## 2019-05-16 DIAGNOSIS — E785 Hyperlipidemia, unspecified: Secondary | ICD-10-CM | POA: Diagnosis not present

## 2019-05-16 DIAGNOSIS — K921 Melena: Secondary | ICD-10-CM | POA: Diagnosis not present

## 2019-05-16 DIAGNOSIS — I6529 Occlusion and stenosis of unspecified carotid artery: Secondary | ICD-10-CM

## 2019-05-16 DIAGNOSIS — G8929 Other chronic pain: Secondary | ICD-10-CM | POA: Diagnosis present

## 2019-05-16 DIAGNOSIS — K922 Gastrointestinal hemorrhage, unspecified: Secondary | ICD-10-CM | POA: Diagnosis present

## 2019-05-16 DIAGNOSIS — Z8589 Personal history of malignant neoplasm of other organs and systems: Secondary | ICD-10-CM

## 2019-05-16 DIAGNOSIS — K9423 Gastrostomy malfunction: Secondary | ICD-10-CM | POA: Diagnosis not present

## 2019-05-16 DIAGNOSIS — R42 Dizziness and giddiness: Secondary | ICD-10-CM | POA: Diagnosis not present

## 2019-05-16 DIAGNOSIS — I6523 Occlusion and stenosis of bilateral carotid arteries: Secondary | ICD-10-CM | POA: Diagnosis not present

## 2019-05-16 DIAGNOSIS — I10 Essential (primary) hypertension: Secondary | ICD-10-CM | POA: Diagnosis present

## 2019-05-16 DIAGNOSIS — Z681 Body mass index (BMI) 19 or less, adult: Secondary | ICD-10-CM

## 2019-05-16 DIAGNOSIS — Z86010 Personal history of colonic polyps: Secondary | ICD-10-CM

## 2019-05-16 DIAGNOSIS — C189 Malignant neoplasm of colon, unspecified: Secondary | ICD-10-CM | POA: Diagnosis not present

## 2019-05-16 DIAGNOSIS — K259 Gastric ulcer, unspecified as acute or chronic, without hemorrhage or perforation: Secondary | ICD-10-CM | POA: Diagnosis not present

## 2019-05-16 DIAGNOSIS — K3189 Other diseases of stomach and duodenum: Secondary | ICD-10-CM | POA: Diagnosis not present

## 2019-05-16 LAB — COMPREHENSIVE METABOLIC PANEL
ALT: 25 U/L (ref 0–44)
AST: 32 U/L (ref 15–41)
Albumin: 3.7 g/dL (ref 3.5–5.0)
Alkaline Phosphatase: 91 U/L (ref 38–126)
Anion gap: 9 (ref 5–15)
BUN: 36 mg/dL — ABNORMAL HIGH (ref 8–23)
CO2: 20 mmol/L — ABNORMAL LOW (ref 22–32)
Calcium: 9 mg/dL (ref 8.9–10.3)
Chloride: 109 mmol/L (ref 98–111)
Creatinine, Ser: 0.77 mg/dL (ref 0.44–1.00)
GFR calc Af Amer: >60 mL/min (ref >60)
GFR calc non Af Amer: >60 mL/min (ref >60)
Glucose, Bld: 106 mg/dL — ABNORMAL HIGH (ref 70–99)
Potassium: 4.2 mmol/L (ref 3.5–5.1)
Sodium: 138 mmol/L (ref 135–145)
Total Bilirubin: 0.5 mg/dL (ref 0.3–1.2)
Total Protein: 6.7 g/dL (ref 6.5–8.1)

## 2019-05-16 LAB — CBC
HCT: 43.4 % (ref 36.0–46.0)
Hemoglobin: 14.2 g/dL (ref 12.0–15.0)
MCH: 30.1 pg (ref 26.0–34.0)
MCHC: 32.7 g/dL (ref 30.0–36.0)
MCV: 91.9 fL (ref 80.0–100.0)
Platelets: 294 10*3/uL (ref 150–400)
RBC: 4.72 MIL/uL (ref 3.87–5.11)
RDW: 12.3 % (ref 11.5–15.5)
WBC: 10.1 10*3/uL (ref 4.0–10.5)
nRBC: 0 % (ref 0.0–0.2)

## 2019-05-16 LAB — URINALYSIS, COMPLETE (UACMP) WITH MICROSCOPIC
Bacteria, UA: NONE SEEN
Bilirubin Urine: NEGATIVE
Glucose, UA: NEGATIVE mg/dL
Hgb urine dipstick: NEGATIVE
Ketones, ur: NEGATIVE mg/dL
Nitrite: NEGATIVE
Protein, ur: NEGATIVE mg/dL
Specific Gravity, Urine: 1.026 (ref 1.005–1.030)
pH: 5 (ref 5.0–8.0)

## 2019-05-16 LAB — SARS CORONAVIRUS 2 BY RT PCR (HOSPITAL ORDER, PERFORMED IN ~~LOC~~ HOSPITAL LAB): SARS Coronavirus 2: NEGATIVE

## 2019-05-16 LAB — TYPE AND SCREEN
ABO/RH(D): A POS
Antibody Screen: NEGATIVE

## 2019-05-16 LAB — HEMOGLOBIN AND HEMATOCRIT, BLOOD
HCT: 39.3 % (ref 36.0–46.0)
Hemoglobin: 12.9 g/dL (ref 12.0–15.0)

## 2019-05-16 LAB — PROTIME-INR
INR: 0.9 (ref 0.8–1.2)
Prothrombin Time: 11.8 seconds (ref 11.4–15.2)

## 2019-05-16 LAB — APTT: aPTT: <24 seconds — ABNORMAL LOW (ref 24–36)

## 2019-05-16 MED ORDER — PANTOPRAZOLE SODIUM 40 MG IV SOLR
40.0000 mg | Freq: Once | INTRAVENOUS | Status: AC
  Administered 2019-05-16: 40 mg via INTRAVENOUS
  Filled 2019-05-16: qty 40

## 2019-05-16 MED ORDER — SODIUM CHLORIDE 0.9 % IV SOLN
8.0000 mg/h | INTRAVENOUS | Status: DC
  Administered 2019-05-16 – 2019-05-17 (×2): 8 mg/h via INTRAVENOUS
  Filled 2019-05-16 (×2): qty 80

## 2019-05-16 MED ORDER — SODIUM CHLORIDE 0.9 % IV SOLN
INTRAVENOUS | Status: DC
  Administered 2019-05-16 – 2019-05-17 (×2): via INTRAVENOUS

## 2019-05-16 MED ORDER — CHOLESTYRAMINE 4 GM/DOSE PO POWD: 4.0000 g | Freq: Three times a day (TID) | ORAL | Status: DC

## 2019-05-16 MED ORDER — SODIUM CHLORIDE 0.9% FLUSH
3.0000 mL | Freq: Once | INTRAVENOUS | Status: AC
  Administered 2019-05-16: 3 mL via INTRAVENOUS

## 2019-05-16 MED ORDER — MORPHINE SULFATE (CONCENTRATE) 10 MG/0.5ML PO SOLN
5.0000 mg | ORAL | Status: DC | PRN
  Filled 2019-05-16: qty 0.5

## 2019-05-16 MED ORDER — FENTANYL 25 MCG/HR TD PT72
1.0000 | MEDICATED_PATCH | TRANSDERMAL | Status: DC
  Administered 2019-05-16: 19:00:00 1 via TRANSDERMAL
  Filled 2019-05-16 (×2): qty 1

## 2019-05-16 NOTE — Telephone Encounter (Signed)
I called and spoke with patient's husband. Given reported large amount of GI bleeding, I recommended that patient be evaluated in the ER. Husband says that he thinks symptoms are better and that patient feels better now after episode of bloody diarrhea and hematemesis. He says he wants her to have the carotid doppler this afternoon and then maybe see ENT tomorrow.  I reiterated my recommendation for GI workup through the ER and described the potential serious decline/death that can occur with GI bleeding. I spoke with Hassan Rowan and asked that we call to check on patient later today.

## 2019-05-16 NOTE — ED Triage Notes (Addendum)
States has been dizzy since yesterday. Husband contacted and screened to come in due to patient having trouble answering questions. When husband arrives states she has had weakness x 2 weeks. Sent by Dr. Pryor Ochoa

## 2019-05-16 NOTE — ED Provider Notes (Signed)
[facility] Emergency Department Provider Note  ____________________________________________   First MD Initiated Contact with Patient 05/16/19 1407     (approximate)  I have reviewed the triage vital signs and the nursing notes.  History  Chief Complaint Dizziness    HPI Dana Perez is a 78 y.o. 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   Husband states 2 days ago he noticed blood in the tubing of her G-tube.  Later that day the patient had an episode of hematemesis.  Since then she has had 2 episodes of dark black, tarry stools.  Patient reports a mild associated upper abdominal pain.  Husband also reports generalized weakness.  She is not on any blood thinning medications, no heavy NSAID use.  She denies any pain with feedings, though the husband at bedside states he has felt some mild resistance with flushing recently.  No bleeding from her G-tube insertion site, but she is having some mild dark brown fluid leaking from the site.  Her G-tube was just exchange in office on 05/03/2019.  Husband states that the tubing does sometimes slide in and out, but he does not think it is been dislodged or removed.  Patient had a colonoscopy in 2018, with removal of a polyp.  The husband states that she was diagnosed with colon cancer, but she has not undergone any treatment for this because she has been busy undergoing treatment of her head and neck cancer.   Past Medical Hx Past Medical History:  Diagnosis Date  . Adenocarcinoma of colon ([place])   . Benign neoplasm of ascending colon   . Essential hypertension 06/13/2014  . Goals of care, counseling/discussion 07/14/2017  . HLD (hyperlipidemia) 06/13/2014  . Hypercholesteremia   . Hyperlipidemia   . Multiple thyroid nodules 01/22/2012  . Osteoporosis   . Polycythemia, secondary 12/26/2014  . Pure  hypercholesterolemia 03/15/2015  . Squamous cell carcinoma of head and neck ([place])   . Thyroid nodule     Problem List Patient Active Problem List   Diagnosis Date Noted  . Complication of gastrostomy tube ([place]) 09/03/2018  . Gastrostomy malfunction ([place]) 05/18/2018  . Skin lesion 05/18/2018  . Nausea and vomiting 02/03/2018  . Cancer associated pain 12/31/2017  . Healthcare-associated pneumonia   . CAP (community acquired pneumonia) 11/15/2017  . Acute hypoxemic respiratory failure ([place]) 11/15/2017  . Oral candidiasis 11/15/2017  . Acute respiratory failure ([place]) 11/15/2017  . Trimalleolar fracture of ankle, closed, left, with routine healing, subsequent encounter 09/27/2017  . Malnutrition of moderate degree 08/10/2017  . Closed left ankle fracture 08/07/2017  . Goals of care, counseling/discussion 07/14/2017  . Adenocarcinoma of colon ([place])   . Benign neoplasm of ascending colon   . Squamous cell carcinoma of head and neck ([place]) 07/01/2017  . CKD (chronic kidney disease) stage 3, GFR 30-59 ml/min (HCC) 04/26/2017  . Erythrocytosis 07/17/2015  . Pure hypercholesterolemia 03/15/2015  . Polycythemia, secondary 12/26/2014  . Thyroid nodule 12/14/2014  . HLD (hyperlipidemia) 06/13/2014  . Essential (primary) hypertension 06/13/2014  . Hyperlipidemia, unspecified 06/13/2014  . Essential hypertension 06/13/2014  . Multiple thyroid nodules 01/22/2012    Past Surgical Hx Past Surgical History:  Procedure Laterality Date  . ABDOMINAL HYSTERECTOMY    . COLONOSCOPY WITH PROPOFOL N/A 07/02/2017   Procedure: COLONOSCOPY WITH PROPOFOL;  Surgeon: Lucilla Lame, MD;  Location: [facility];  Service: Endoscopy;  Laterality: N/A;  .  ORIF ANKLE FRACTURE Left 08/08/2017   Procedure: OPEN REDUCTION INTERNAL FIXATION (ORIF) ANKLE FRACTURE;  Surgeon: Dereck Leep, MD;  Location: [facility];  Service: Orthopedics;  Laterality: Left;  . POLYPECTOMY N/A 07/02/2017   Procedure:  POLYPECTOMY;  Surgeon: Lucilla Lame, MD;  Location: [facility];  Service: Endoscopy;  Laterality: N/A;  . WRIST FRACTURE SURGERY  08/2009   badly broken    Medications Prior to Admission medications   Medication Sig Start Date End Date Taking? Authorizing Provider  cholestyramine (QUESTRAN) 4 GM/DOSE powder Take 1 packet (4 g total) by mouth 3 (three) times daily with meals. 08/30/18   Jacquelin Hawking, NP  clotrimazole-betamethasone (LOTRISONE) cream APPLY TO AFFECTED AREA TWICE A DAY 10/05/18   [provider]  Ensure Plus (ENSURE PLUS) LIQD Give 1 carton of ensure plus 5 times daily via feeding tube.  Flush with 51m of water before and after feeding. 10/28/18   FLloyd Huger MD  fentaNYL (DURAGESIC) 25 MCG/HR Place 1 patch onto the skin every 3 (three) days. 05/03/19   Borders, JKirt Boys NP  ibuprofen (ADVIL,MOTRIN) 200 MG tablet Take 400 mg by mouth every 4 (four) hours as needed.    [provider]  metoCLOPramide (REGLAN) 10 MG tablet Take 1 tablet (10 mg total) by mouth every 8 (eight) hours as needed for nausea. 02/24/19   Borders, JKirt Boys NP  morphine (ROXANOL) 20 MG/ML concentrated solution Place 0.25 mLs (5 mg total) into feeding tube every 4 (four) hours as needed for moderate pain or severe pain. 05/03/19   Borders, JKirt Boys NP  NARCAN 4 MG/0.1ML LIQD nasal spray kit USE 1 SPRAY PER INTRANASAL ROUTE AS DIRECTED 05/03/18   [provider]  Polyethylene Glycol 3350 (PEG 3350) 17 GM/SCOOP POWD Take 17 g by mouth 1 day or 1 dose.    [provider]  pravastatin (PRAVACHOL) 10 MG tablet Take 10 mg by mouth daily.    [provider]  prochlorperazine (COMPAZINE) 10 MG tablet Take 1 tablet (10 mg total) by mouth every 6 (six) hours as needed for nausea or vomiting. 01/21/19   Borders, JKirt Boys NP    Allergies Potassium, Other, and Tetanus antitoxin  Family Hx Family History  Problem Relation Age of Onset  . Heart disease Mother    . Diabetes Father     Social Hx Social History   Tobacco Use  . Smoking status: Former Smoker    Packs/day: 0.25    Types: Cigarettes    Quit date: 06/17/2017    Years since quitting: 1.9  . Smokeless tobacco: Never Used  Substance Use Topics  . Alcohol use: No  . Drug use: No     Review of Systems  Constitutional: Negative for fever, chills. Eyes: Negative for visual changes. ENT: Negative for sore throat. Cardiovascular: Negative for chest pain. Respiratory: Negative for shortness of breath. Gastrointestinal: + hematemesis, melena Genitourinary: Negative for dysuria. Musculoskeletal: Negative for leg swelling. Skin: Negative for rash. Neurological: Negative for for headaches.   Physical Exam  Vital Signs: ED Triage Vitals  Enc Vitals Group     BP 05/16/19 1248 130/68     Pulse Rate 05/16/19 1248 (!) 111     Resp 05/16/19 1248 20     Temp 05/16/19 1248 (!) 97.5 F (36.4 C)     Temp Source 05/16/19 1248 Oral     SpO2 05/16/19 1248 96 %     Weight 05/16/19 1251 110 lb (49.9 kg)  Height 05/16/19 1251 5' 6.5" (1.689 m)     Head Circumference --      Peak Flow --      Pain Score 05/16/19 1251 0     Pain Loc --      Pain Edu? --      Excl. in [place]? --     Constitutional: Alert and oriented.  Frail and elderly appearing. Head: Normocephalic. Atraumatic. Eyes: Conjunctivae clear. Sclera anicteric. Nose: No congestion. No rhinorrhea. Mouth/Throat: Mucous membranes are dry.  Neck: s/p radiation therapy Cardiovascular: Normal rate, regular rhythm.  Extremities well perfused. Respiratory: Normal respiratory effort.  Lungs CTAB. Gastrointestinal: Soft.  Mild upper abdominal tenderness, no rebound or guarding.  Minimal erythema of the skin at the G-tube insertion site.  Gauze dressing with some dark brown fluid discharge. Rectal: RN chaperone present. Black stool, immediately guaiac positive.  Musculoskeletal: No lower extremity edema. No  deformities. Neurologic:  Normal speech and language. No gross focal neurologic deficits are appreciated.  Skin: Skin is warm, dry and intact. No rash noted. Psychiatric: Mood and affect are appropriate for situation.  EKG  Personally reviewed.   Rate: 113 Rhythm: Sinus Axis: Rightward Intervals: Slightly prolonged QTc (507 ms) Non-specific ST/T findings No acute ischemic changes No STEMI    Radiology  N/A   Procedures  Procedure(s) performed (including critical care):  Procedures   Initial Impression / Assessment and Plan / ED Course  78 y.o. female who presents to the ED for an episode of hematemesis and melena, concerning for GIB.  Ddx: GI bleeding, possible gastritis source, irritation from G-tube, colon cancer  Plan: labs, T&S, Protonix.   Labs reveal stable hemoglobin, hematocrit.  Rectal exam reveals dark black stool, immediately guaiac positive.  Although her hemoglobin is within normal limits, given she was initially tachycardic, with significant medical comorbidities, feel that more urgent evaluation by GI is warranted.  Discussed the patient's presentation with GI, Dr. Marius Ditch who is in agreement.  Recommend IV PPI and then Protonix drip, ordered.  Discussed with hospitalist for admission.   Final Clinical Impression(s) / ED Diagnosis  Final diagnoses:  Hematemesis with nausea  Melena      Note:  This document was prepared using Dragon voice recognition software and may include unintentional dictation errors.   Lilia Pro., MD 05/16/19 [phone]

## 2019-05-16 NOTE — ED Notes (Signed)
Lab at bedside at this time to attempt to redraw type and screen.

## 2019-05-16 NOTE — ED Notes (Signed)
Gave pt urine cup with bag.  

## 2019-05-16 NOTE — ED Notes (Addendum)
Pt presents to ED via POV with her husband, per husband pt with bleeding noted to her feeding tube on Saturday, bloody diarrhea noted since then. Pt's husband also states hematemesis since Saturday. Pt denies any pain, c/o feeling dizzy at this time. Pt is alert and able to answer questions. Pt also c/o dizziness since Saturday, pt's husband states pt was supposed to have US carotid for her hearing today at 1445.

## 2019-05-16 NOTE — Progress Notes (Signed)
Care Alignment Note  Advanced Directives Documents (Living Will, Power of Attorney) currently in the EHR no advanced directives documents available .  Has the patient discussed their wishes with their family/healthcare power of attorney yes. How much does the family or healthcare power of attorney know about their wishes. Patient's husband at bedside understands patient's medical history including squamous cell cancer of the head and neck status post chemotherapy.  Radiation therapy and surgery.  Patient admitted with GI bleed.  Husband confirmed CODE STATUS to be full code going forward.  What does the patient/decision maker understand about their medical condition and the natural course of their disease.  GI bleed.  History of squamous cell cancer of the head and neck.  Hypertension.  What is the patient/decision maker's biggest fear or concern for the future pain and suffering   What is the most important goal for this patient should their health condition worsen maintenance of function.  Current   Code Status: Full Code  Current code status has been reviewed/updated.  Time spent:19 minutes

## 2019-05-16 NOTE — ED Notes (Signed)
Type and screen recollected by this RN from a new IV started by this RN.

## 2019-05-16 NOTE — H&P (Signed)
[place] [facility] [place] NAME: Dana Perez    MR#:  [id number]  DATE OF BIRTH:  1940/12/05  DATE OF ADMISSION:  05/16/2019  PRIMARY CARE PHYSICIAN: [name], MD   REQUESTING/REFERRING PHYSICIAN: Derrell Lolling  CHIEF COMPLAINT:   Chief Complaint  Patient presents with  . Dizziness    HISTORY OF PRESENT ILLNESS:  Dana Perez  is a 78 y.o. female with a known history of squamous cell cancer of the head and neck status post surgery, chemotherapy, immunotherapy and radiation therapy and status post PEG tube placement, hypertension and hyperlipidemia who was brought into the emergency room with complaints of hematemesis as well as having dark melanotic stools in the PEG tube.  Patient was evaluated in the emergency room found to have hemoglobin stable at 14.2.  Case discussed with gastroenterologist by ED physician.  Recommendation was to admit for further evaluation.  Patient started on Protonix drip.  Patient denied any abdominal pains at this time.  Most of the history was obtained from patient's husband at bedside.  PAST MEDICAL HISTORY:   Past Medical History:  Diagnosis Date  . Adenocarcinoma of colon ([place])   . Benign neoplasm of ascending colon   . Essential hypertension 06/13/2014  . Goals of care, counseling/discussion 07/14/2017  . HLD (hyperlipidemia) 06/13/2014  . Hypercholesteremia   . Hyperlipidemia   . Multiple thyroid nodules 01/22/2012  . Osteoporosis   . Polycythemia, secondary 12/26/2014  . Pure hypercholesterolemia 03/15/2015  . Squamous cell carcinoma of head and neck ([place])   . Thyroid nodule     PAST SURGICAL HISTORY:   Past Surgical History:  Procedure Laterality Date  . ABDOMINAL HYSTERECTOMY    . COLONOSCOPY WITH PROPOFOL N/A 07/02/2017   Procedure: COLONOSCOPY WITH PROPOFOL;  Surgeon: Lucilla Lame, MD;  Location: [facility];  Service: Endoscopy;  Laterality: N/A;  . ORIF ANKLE FRACTURE  Left 08/08/2017   Procedure: OPEN REDUCTION INTERNAL FIXATION (ORIF) ANKLE FRACTURE;  Surgeon: Dereck Leep, MD;  Location: [facility];  Service: Orthopedics;  Laterality: Left;  . POLYPECTOMY N/A 07/02/2017   Procedure: POLYPECTOMY;  Surgeon: Lucilla Lame, MD;  Location: [facility];  Service: Endoscopy;  Laterality: N/A;  . WRIST FRACTURE SURGERY  08/2009   badly broken    SOCIAL HISTORY:   Social History   Tobacco Use  . Smoking status: Former Smoker    Packs/day: 0.25    Types: Cigarettes    Quit date: 06/17/2017    Years since quitting: 1.9  . Smokeless tobacco: Never Used  Substance Use Topics  . Alcohol use: No    FAMILY HISTORY:   Family History  Problem Relation Age of Onset  . Heart disease Mother   . Diabetes Father     DRUG ALLERGIES:   Allergies  Allergen Reactions  . Potassium Nausea Only and Nausea And Vomiting  . Other Other (See Comments) and Rash    TDAP tdap TDAP  . Tetanus Antitoxin Rash    REVIEW OF SYSTEMS:   ROS unobtainable due to medical condition.  Patient is nonverbal due to prior head and neck cancer.  Does respond by nodding her head however intermittently.  MEDICATIONS AT HOME:   Prior to Admission medications   Medication Sig Start Date End Date Taking? Authorizing Provider  fentaNYL (DURAGESIC) 25 MCG/HR Place 1 patch onto the skin every 3 (three) days. 05/03/19  Yes Borders, Kirt Boys, NP  ibuprofen (ADVIL,MOTRIN) 200 MG tablet  Take 400 mg by mouth every 4 (four) hours as needed.   Yes [provider]  morphine (ROXANOL) 20 MG/ML concentrated solution Place 0.25 mLs (5 mg total) into feeding tube every 4 (four) hours as needed for moderate pain or severe pain. 05/03/19  Yes Borders, Kirt Boys, NP  pravastatin (PRAVACHOL) 10 MG tablet Take 10 mg by mouth daily.   Yes [provider]  prochlorperazine (COMPAZINE) 10 MG tablet Take 1 tablet (10 mg total) by mouth every 6 (six) hours as needed for nausea or  vomiting. 01/21/19  Yes Borders, Kirt Boys, NP  cholestyramine Lucrezia Starch) 4 GM/DOSE powder Take 1 packet (4 g total) by mouth 3 (three) times daily with meals. 08/30/18   Jacquelin Hawking, NP  clotrimazole-betamethasone (LOTRISONE) cream APPLY TO AFFECTED AREA TWICE A DAY 10/05/18   [provider]  Ensure Plus (ENSURE PLUS) LIQD Give 1 carton of ensure plus 5 times daily via feeding tube.  Flush with 88m of water before and after feeding. 10/28/18   FLloyd Huger MD  metoCLOPramide (REGLAN) 10 MG tablet Take 1 tablet (10 mg total) by mouth every 8 (eight) hours as needed for nausea. Patient not taking: Reported on 05/16/2019 02/24/19   Borders, JKirt Boys NP  NARCAN 4 MG/0.1ML LIQD nasal spray kit USE 1 SPRAY PER INTRANASAL ROUTE AS DIRECTED 05/03/18   [provider]  Polyethylene Glycol 3350 (PEG 3350) 17 GM/SCOOP POWD Take 17 g by mouth 1 day or 1 dose.    [provider]      VITAL SIGNS:  Blood pressure 135/85, pulse 92, temperature (!) 97.5 F (36.4 C), temperature source Oral, resp. rate (!) 21, height 5' 6.5" (1.689 m), weight 49.9 kg, SpO2 98 %.  PHYSICAL EXAMINATION:  Physical Exam  GENERAL:  78y.o.-year-old patient lying in the bed with no acute distress.  EYES: Pupils equal, round, reactive to light and accommodation. No scleral icterus. Extraocular muscles intact.  HEENT: Head atraumatic, normocephalic. Oropharynx and nasopharynx clear.  NECK:  Supple, no jugular venous distention. No thyroid enlargement, no tenderness.  LUNGS: Normal breath sounds bilaterally, no wheezing, rales,rhonchi or crepitation. No use of accessory muscles of respiration.  CARDIOVASCULAR: S1, S2 normal. No murmurs, rubs, or gallops.  ABDOMEN: Soft, nontender, nondistended. Bowel sounds present.  Patient has PEG tube in place   EXTREMITIES: No pedal edema, cyanosis, or clubbing.  NEUROLOGIC: Generalized weakness.  Gait not checked.  PSYCHIATRIC: The patient is alert and  oriented x 3.  SKIN: No obvious rash, lesion, or ulcer.   LABORATORY PANEL:   CBC Recent Labs  Lab 05/16/19 1257  WBC 10.1  HGB 14.2  HCT 43.4  PLT 294   ------------------------------------------------------------------------------------------------------------------  Chemistries  Recent Labs  Lab 05/16/19 1257  NA 138  K 4.2  CL 109  CO2 20*  GLUCOSE 106*  BUN 36*  CREATININE 0.77  CALCIUM 9.0  AST 32  ALT 25  ALKPHOS 91  BILITOT 0.5   ------------------------------------------------------------------------------------------------------------------  Cardiac Enzymes No results for input(s): TROPONINI in the last 168 hours. ------------------------------------------------------------------------------------------------------------------  RADIOLOGY:  No results found.    IMPRESSION AND PLAN:  Patient is a 78year old female with history of  squamous cell cancer of the head and neck status post surgery, chemotherapy, immunotherapy and radiation therapy and status post PEG tube placement, hypertension and hyperlipidemia being admitted for further evaluation of GI bleed  1.  GI bleed Patient with hematemesis and dark stools and PEG tube Already seen by gastroenterologist  in the emergency room.  Plans for endoscopic evaluation tomorrow. IV fluid hydration.  Monitor serial hemoglobin.  Hemoglobin stable at 14.1 this time.  2.  History of squamous cell cancer of the head and neck Patient status post surgery, chemotherapy, immunotherapy and radiation therapy. Hospital notified by nursing staff that patient has ENT physician Dr. Pryor Ochoa had carotid Doppler ultrasound scheduled for today and wanted it done during this admission.  Requested for carotid Doppler ultrasound.  Follow-up with her ENT physician and oncologist post discharge from the hospital. Continue pain control. Review home meds once medication reconciliation is done.  3.  Hypertension Blood pressure  controlled  DVT prophylaxis; SCDs No heparin products due to GI bleed   All the records are reviewed and case discussed with ED provider. Management plans discussed with the patient, family and they are in agreement. Updated husband present at bedside on treatment plans and all questions were answered.  He confirmed patient's CODE STATUS to be full code  CODE STATUS: Full code  TOTAL TIME TAKING CARE OF THIS PATIENT: 62 minutes.    Zuleika Gallus M.D on 05/16/2019 at 6:22 PM  Between 7am to 6pm - Pager - [phone]  After 6pm go to [email or web address] - Proofreader  [facility]  Office  [phone]  CC: Primary care physician; [name], MD   Note: This dictation was prepared with Dragon dictation along with smaller phrase technology. Any transcriptional errors that result from this process are unintentional.

## 2019-05-16 NOTE — Telephone Encounter (Signed)
Mr Bown called reporting that he has noticed blood in patient feeding tube and that she also vomited a large amt of dark red blood and also is having tarry black diarrhea. He states he needs a call back before noon as she has a Carotid doppler this afternoon.

## 2019-05-16 NOTE — Telephone Encounter (Signed)
I called [facility] and left message for nurse about what is going on with patient and they called husband and cancelled her Carotid dopler and told him to take her to the ER and he agreed and is taking her to ER

## 2019-05-16 NOTE — Consult Note (Signed)
Cephas Darby, MD [address]  [place]  [place], [place] [address]  Main: [phone]  Fax: [phone] Pager: [phone]   Consultation  Referring Provider:     No ref. provider found Primary Care Physician:  [name], MD Primary Gastroenterologist:  Dr. Lucilla Lame      Reason for Consultation:     Melena, hematemesis  Date of Admission:  05/16/2019 Date of Consultation:  05/16/2019         HPI:   Dana Perez is a 78 y.o. 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  The symptoms were noticed by her husband about 2 days ago.  In the ER, her hemoglobin was 14.2, dropped from 15.7 about a week ago.  Her BUN is elevated and this is chronic but slightly worse today.  Normal creatinine.  Normal PT/INR, platelets.  GI is consulted for further evaluation Patient's husband reports that the G-tube was replaced by Dr. Peyton Najjar recently  NSAIDs: None  Antiplts/Anticoagulants/Anti thrombotics: None  GI Procedures: Colonoscopy 06/2017 by Dr. Allen Norris The perianal and digital rectal examinations were normal. A 20 mm polyp was found in the ascending colon. The polyp was sessile. Area was successfully injected with 20 mL saline for a lift polypectomy. The polyp was removed with a hot snare. Resection and retrieval were complete. To prevent bleeding postintervention, three hemostatic clips were successfully placed (MR conditional). There was no bleeding at the end of the procedure.   Past Medical History:  Diagnosis Date  . Adenocarcinoma of colon ([facility])   . Benign neoplasm of ascending colon   .  Essential hypertension 06/13/2014  . Goals of care, counseling/discussion 07/14/2017  . HLD (hyperlipidemia) 06/13/2014  . Hypercholesteremia   . Hyperlipidemia   . Multiple thyroid nodules 01/22/2012  . Osteoporosis   . Polycythemia, secondary 12/26/2014  . Pure hypercholesterolemia 03/15/2015  . Squamous cell carcinoma of head and neck ([facility])   . Thyroid nodule     Past Surgical History:  Procedure Laterality Date  . ABDOMINAL HYSTERECTOMY    . COLONOSCOPY WITH PROPOFOL N/A 07/02/2017   Procedure: COLONOSCOPY WITH PROPOFOL;  Surgeon: Lucilla Lame, MD;  Location: [facility];  Service: Endoscopy;  Laterality: N/A;  . ORIF ANKLE FRACTURE Left 08/08/2017   Procedure: OPEN REDUCTION INTERNAL FIXATION (ORIF) ANKLE FRACTURE;  Surgeon: Dereck Leep, MD;  Location: [facility];  Service: Orthopedics;  Laterality: Left;  . POLYPECTOMY N/A 07/02/2017   Procedure: POLYPECTOMY;  Surgeon: Lucilla Lame, MD;  Location: [facility];  Service: Endoscopy;  Laterality: N/A;  . WRIST FRACTURE SURGERY  08/2009   badly broken    Prior to Admission medications   Medication Sig Start Date End Date Taking? Authorizing Provider  cholestyramine (QUESTRAN) 4 GM/DOSE powder Take 1 packet (4 g total) by mouth 3 (three) times daily with meals. 08/30/18   Jacquelin Hawking, NP  clotrimazole-betamethasone (LOTRISONE) cream APPLY TO AFFECTED AREA TWICE A DAY 10/05/18   [provider]  Ensure Plus (ENSURE PLUS) LIQD Give 1 carton of ensure plus 5 times daily via feeding tube.  Flush with 22m of water before and  after feeding. 10/28/18   [name], MD  fentaNYL (DURAGESIC) 25 MCG/HR Place 1 patch onto the skin every 3 (three) days. 05/03/19   Borders, Kirt Boys, NP  ibuprofen (ADVIL,MOTRIN) 200 MG tablet Take 400 mg by mouth every 4 (four) hours as needed.    [provider]  metoCLOPramide (REGLAN) 10 MG tablet Take 1 tablet (10 mg total) by mouth every 8 (eight) hours as needed  for nausea. 02/24/19   Borders, Kirt Boys, NP  morphine (ROXANOL) 20 MG/ML concentrated solution Place 0.25 mLs (5 mg total) into feeding tube every 4 (four) hours as needed for moderate pain or severe pain. 05/03/19   Borders, Kirt Boys, NP  NARCAN 4 MG/0.1ML LIQD nasal spray kit USE 1 SPRAY PER INTRANASAL ROUTE AS DIRECTED 05/03/18   [provider]  Polyethylene Glycol 3350 (PEG 3350) 17 GM/SCOOP POWD Take 17 g by mouth 1 day or 1 dose.    [provider]  pravastatin (PRAVACHOL) 10 MG tablet Take 10 mg by mouth daily.    [provider]  prochlorperazine (COMPAZINE) 10 MG tablet Take 1 tablet (10 mg total) by mouth every 6 (six) hours as needed for nausea or vomiting. 01/21/19   Borders, Kirt Boys, NP    Current Facility-Administered Medications:  .  pantoprazole (PROTONIX) 80 mg in sodium chloride 0.9 % 250 mL (0.32 mg/mL) infusion, 8 mg/hr, Intravenous, Continuous, Monks, Damaris Hippo., MD, Last Rate: 25 mL/hr at 05/16/19 1622, 8 mg/hr at 05/16/19 1622  Current Outpatient Medications:  .  cholestyramine (QUESTRAN) 4 GM/DOSE powder, Take 1 packet (4 g total) by mouth 3 (three) times daily with meals., Disp: 378 g, Rfl: 12 .  clotrimazole-betamethasone (LOTRISONE) cream, APPLY TO AFFECTED AREA TWICE A DAY, Disp: , Rfl:  .  Ensure Plus (ENSURE PLUS) LIQD, Give 1 carton of ensure plus 5 times daily via feeding tube.  Flush with 38m of water before and after feeding., Disp: 1185 mL, Rfl: 0 .  fentaNYL (DURAGESIC) 25 MCG/HR, Place 1 patch onto the skin every 3 (three) days., Disp: 5 patch, Rfl: 0 .  ibuprofen (ADVIL,MOTRIN) 200 MG tablet, Take 400 mg by mouth every 4 (four) hours as needed., Disp: , Rfl:  .  metoCLOPramide (REGLAN) 10 MG tablet, Take 1 tablet (10 mg total) by mouth every 8 (eight) hours as needed for nausea., Disp: 90 tablet, Rfl: 0 .  morphine (ROXANOL) 20 MG/ML concentrated solution, Place 0.25 mLs (5 mg total) into feeding tube every 4 (four) hours as needed for  moderate pain or severe pain., Disp: 30 mL, Rfl: 0 .  NARCAN 4 MG/0.1ML LIQD nasal spray kit, USE 1 SPRAY PER INTRANASAL ROUTE AS DIRECTED, Disp: , Rfl: 0 .  Polyethylene Glycol 3350 (PEG 3350) 17 GM/SCOOP POWD, Take 17 g by mouth 1 day or 1 dose., Disp: , Rfl:  .  pravastatin (PRAVACHOL) 10 MG tablet, Take 10 mg by mouth daily., Disp: , Rfl:  .  prochlorperazine (COMPAZINE) 10 MG tablet, Take 1 tablet (10 mg total) by mouth every 6 (six) hours as needed for nausea or vomiting., Disp: 60 tablet, Rfl: 2  Facility-Administered Medications Ordered in Other Encounters:  .  heparin lock flush 100 unit/mL, 250 Units, Intracatheter, PRN, PMa Hillock Sandeep, MD .  heparin lock flush 100 unit/mL, 500 Units, Intracatheter, PRN, PMa Hillock Sandeep, MD .  potassium chloride 20 mEq in sodium chloride 0.9 % 500 mL injection, , Intravenous, Once, Saunders, Doni H, RN .  sodium chloride 0.9 %  injection 10 mL, 10 mL, Intracatheter, PRN, Ma Hillock, Sandeep, MD .  sodium chloride 0.9 % injection 10 mL, 10 mL, Intracatheter, PRN, Ma Hillock, Sandeep, MD .  sodium chloride 0.9 % injection 10 mL, 10 mL, Intracatheter, PRN, Ma Hillock, Sandeep, MD .  sodium chloride 0.9 % injection 10 mL, 10 mL, Intracatheter, PRN, Leia Alf, MD  Family History  Problem Relation Age of Onset  . Heart disease Mother   . Diabetes Father      Social History   Tobacco Use  . Smoking status: Former Smoker    Packs/day: 0.25    Types: Cigarettes    Quit date: 06/17/2017    Years since quitting: 1.9  . Smokeless tobacco: Never Used  Substance Use Topics  . Alcohol use: No  . Drug use: No    Allergies as of 05/16/2019 - Review Complete 05/16/2019  Allergen Reaction Noted  . Potassium Nausea Only and Nausea And Vomiting 04/05/2018  . Other Other (See Comments) and Rash 05/25/2014  . Tetanus antitoxin Rash 10/13/2017    Review of Systems:    All systems reviewed and negative except where noted in HPI.   Physical Exam:  Vital signs  in last 24 hours: Temp:  [97.5 F (36.4 C)] 97.5 F (36.4 C) (09/28 1248) Pulse Rate:  [94-111] 94 (09/28 1700) Resp:  [16-23] 23 (09/28 1700) BP: (123-139)/(68-75) 129/75 (09/28 1700) SpO2:  [92 %-99 %] 98 % (09/28 1700) Weight:  [49.9 kg] 49.9 kg (09/28 1251)   General: Ill-appearing, not in distress, thin built Head:  Normocephalic and atraumatic, bitemporal wasting. Eyes:   No icterus.   Conjunctiva pink. PERRLA. Ears: Decreased auditory acuity. Neck:  Supple; no masses or thyroidomegaly Lungs: Respirations even and unlabored. Lungs clear to auscultation bilaterally.   No wheezes, crackles, or rhonchi.  Heart:  Regular rate and rhythm;  Without murmur, clicks, rubs or gallops Abdomen:  Soft, nondistended, nontender.  Skin around the G-tube site is erythematous, no purulent drainage, normal bowel sounds. No appreciable masses or hepatomegaly.  No rebound or guarding.  Rectal:  Not performed. Msk:  Symmetrical without gross deformities.   Extremities:  Without edema, cyanosis or clubbing. Neurologic:  Alert and oriented x2;  grossly normal neurologically. Skin: Thick fibrotic skin on the neck and jaw from radiation  Psych:  Alert and cooperative  LAB RESULTS: CBC Latest Ref Rng & Units 05/16/2019 05/09/2019 03/07/2019  WBC 4.0 - 10.5 K/uL 10.1 8.1 8.0  Hemoglobin 12.0 - 15.0 g/dL 14.2 15.7(H) 16.8(H)  Hematocrit 36.0 - 46.0 % 43.4 47.5(H) 50.4(H)  Platelets 150 - 400 K/uL 294 281 287    BMET BMP Latest Ref Rng & Units 05/16/2019 05/09/2019 03/07/2019  Glucose 70 - 99 mg/dL 106(H) 108(H) 112(H)  BUN 8 - 23 mg/dL 36(H) 30(H) 31(H)  Creatinine 0.44 - 1.00 mg/dL 0.77 0.94 0.91  Sodium 135 - 145 mmol/L 138 140 138  Potassium 3.5 - 5.1 mmol/L 4.2 4.3 4.3  Chloride 98 - 111 mmol/L 109 105 104  CO2 22 - 32 mmol/L 20(L) 26 24  Calcium 8.9 - 10.3 mg/dL 9.0 9.2 9.0    LFT Hepatic Function Latest Ref Rng & Units 05/16/2019 05/09/2019 03/07/2019  Total Protein 6.5 - 8.1 g/dL 6.7 7.4 7.3   Albumin 3.5 - 5.0 g/dL 3.7 3.9 4.0  AST 15 - 41 U/L 32 25 24  ALT 0 - 44 U/L _0 Alk Phosphatase 38 - 126 U/L 91 99 94  Total Bilirubin 0.3 - 1.2  mg/dL 0.5 0.3 0.5     STUDIES: No results found.    Impression / Plan:   Dana Perez is a 78 y.o. female with history of stage IV squamous cell carcinoma of head and neck status post chemo, radiation, surgery, also with adenocarcinoma of the ascending colon presented with 2 days history of bleeding from the G tube, hematemesis as well as melena.  BUN is elevated, about 1 g drop in hemoglobin from baseline  Recommend pantoprazole drip Hold tube feeds Plan for EGD tomorrow   I have discussed alternative options, risks & benefits,  which include, but are not limited to, bleeding, infection, perforation,respiratory complication & drug reaction.  The patient agrees with this plan & written consent will be obtained.     Thank you for involving me in the care of this patient.      LOS: 0 days   Sherri Sear, MD  05/16/2019, 5:10 PM   Note: This dictation was prepared with Dragon dictation along with smaller phrase technology. Any transcriptional errors that result from this process are unintentional.

## 2019-05-16 NOTE — ED Notes (Signed)
This RN apologized for delay, explained pt will be going upstairs when report is called. Pt and husband state understanding at this time. Pt visualized in NAD.

## 2019-05-16 NOTE — ED Notes (Signed)
Pt repositioned in bed by this RN. Covid swab obtained, pt tolerated well. Medication administered per order. Pt's husband remains at bedside at this time. Will continue to monitor for further patient needs.

## 2019-05-17 ENCOUNTER — Inpatient Hospital Stay: Payer: PPO | Admitting: Registered Nurse

## 2019-05-17 ENCOUNTER — Inpatient Hospital Stay: Payer: PPO

## 2019-05-17 ENCOUNTER — Encounter
Admission: EM | Disposition: A | Discharge status: Home Health | Payer: Self-pay | Source: Home / Self Care | Attending: Internal Medicine

## 2019-05-17 ENCOUNTER — Encounter: Payer: Self-pay | Admitting: Anesthesiology

## 2019-05-17 DIAGNOSIS — K921 Melena: Secondary | ICD-10-CM

## 2019-05-17 HISTORY — PX: ESOPHAGOGASTRODUODENOSCOPY: SHX5428

## 2019-05-17 LAB — CBC
HCT: 36 % (ref 36.0–46.0)
Hemoglobin: 11.9 g/dL — ABNORMAL LOW (ref 12.0–15.0)
MCH: 30.3 pg (ref 26.0–34.0)
MCHC: 33.1 g/dL (ref 30.0–36.0)
MCV: 91.6 fL (ref 80.0–100.0)
Platelets: 238 10*3/uL (ref 150–400)
RBC: 3.93 MIL/uL (ref 3.87–5.11)
RDW: 12.3 % (ref 11.5–15.5)
WBC: 7 10*3/uL (ref 4.0–10.5)
nRBC: 0 % (ref 0.0–0.2)

## 2019-05-17 LAB — BASIC METABOLIC PANEL
Anion gap: 6 (ref 5–15)
BUN: 29 mg/dL — ABNORMAL HIGH (ref 8–23)
CO2: 21 mmol/L — ABNORMAL LOW (ref 22–32)
Calcium: 8 mg/dL — ABNORMAL LOW (ref 8.9–10.3)
Chloride: 114 mmol/L — ABNORMAL HIGH (ref 98–111)
Creatinine, Ser: 0.79 mg/dL (ref 0.44–1.00)
GFR calc Af Amer: >60 mL/min (ref >60)
GFR calc non Af Amer: >60 mL/min (ref >60)
Glucose, Bld: 91 mg/dL (ref 70–99)
Potassium: 3.8 mmol/L (ref 3.5–5.1)
Sodium: 141 mmol/L (ref 135–145)

## 2019-05-17 LAB — HEMOGLOBIN AND HEMATOCRIT, BLOOD
HCT: 39.7 % (ref 36.0–46.0)
Hemoglobin: 12.9 g/dL (ref 12.0–15.0)

## 2019-05-17 LAB — MAGNESIUM: Magnesium: 2.1 mg/dL (ref 1.7–2.4)

## 2019-05-17 SURGERY — EGD (ESOPHAGOGASTRODUODENOSCOPY)
Anesthesia: General

## 2019-05-17 MED ORDER — OSMOLITE 1.5 CAL PO LIQD
237.0000 mL | Freq: Every day | ORAL | Status: DC
  Administered 2019-05-17 – 2019-05-18 (×5): 237 mL

## 2019-05-17 MED ORDER — PROPOFOL 10 MG/ML IV BOLUS: INTRAVENOUS | Status: DC | PRN

## 2019-05-17 MED ORDER — PROPOFOL 10 MG/ML IV BOLUS
INTRAVENOUS | Status: DC | PRN
  Administered 2019-05-17: 60 mg via INTRAVENOUS
  Administered 2019-05-17: 10 mg via INTRAVENOUS

## 2019-05-17 MED ORDER — PANTOPRAZOLE SODIUM 40 MG IV SOLR
40.0000 mg | Freq: Two times a day (BID) | INTRAVENOUS | Status: DC
  Administered 2019-05-17 – 2019-05-18 (×2): 40 mg via INTRAVENOUS
  Filled 2019-05-17 (×2): qty 40

## 2019-05-17 NOTE — Op Note (Addendum)
[facility] Gastroenterology Patient Name: Dana Perez Procedure Date: 05/17/2019 10:44 AM MRN: [id number] Account #: [id number] Date of Birth: 01/02/1941 Admit Type: Inpatient Age: 78 Room: [facility] ENDO ROOM 1 Gender: Female Note Status: Finalized Procedure:            Upper GI endoscopy Indications:          Hematemesis, Melena Providers:            Lin Landsman MD, MD Referring MD:         Precious Bard, MD (Referring MD) Medicines:            Monitored Anesthesia Care Complications:        No immediate complications. Estimated blood loss: None. Procedure:            Pre-Anesthesia Assessment:                       - Prior to the procedure, a History and Physical was                        performed, and patient medications and allergies were                        reviewed. The patient is competent. The risks and                        benefits of the procedure and the sedation options and                        risks were discussed with the patient. All questions                        were answered and informed consent was obtained.                        Patient identification and proposed procedure were                        verified by the physician, the nurse, the                        anesthesiologist, the anesthetist and the technician in                        the pre-procedure area in the procedure room in the                        endoscopy suite. Mental Status Examination: alert and                        oriented. Airway Examination: normal oropharyngeal                        airway and neck mobility. Respiratory Examination:                        clear to auscultation. CV Examination: normal.                        Prophylactic Antibiotics: The patient does not require  prophylactic antibiotics. Prior Anticoagulants: The                        patient has taken no previous anticoagulant or         antiplatelet agents. ASA Grade Assessment: III - A                        patient with severe systemic disease. After reviewing                        the risks and benefits, the patient was deemed in                        satisfactory condition to undergo the procedure. The                        anesthesia plan was to use monitored anesthesia care                        (MAC). Immediately prior to administration of                        medications, the patient was re-assessed for adequacy                        to receive sedatives. The heart rate, respiratory rate,                        oxygen saturations, blood pressure, adequacy of                        pulmonary ventilation, and response to care were                        monitored throughout the procedure. The physical status                        of the patient was re-assessed after the procedure.                       After obtaining informed consent, the endoscope was                        passed under direct vision. Throughout the procedure,                        the patient's blood pressure, pulse, and oxygen                        saturations were monitored continuously. The Endoscope                        was introduced through the mouth, and advanced to the                        second part of duodenum. The upper GI endoscopy was                        accomplished without difficulty. The patient tolerated  the procedure well. Findings:      The duodenal bulb and second portion of the duodenum were normal.      One healing superficial gastric ulcer with a clean ulcer base (Forrest       Class III) was found on the lesser curvature of the gastric antrum. The       lesion was 5 mm in largest dimension.      Diffuse mildly erythematous mucosa without bleeding was found in the       gastric fundus.      The gastroesophageal junction and examined esophagus were normal.      There was  evidence of an intact gastrostomy with a patent replacement       balloon inflated G-tube present on the greater curvature of the gastric       body. This was characterized by healthy appearing mucosa. Impression:           - Normal duodenal bulb and second portion of the                        duodenum.                       - Non-bleeding gastric ulcer with a clean ulcer base                        (Forrest Class III).                       - Erythematous mucosa in the gastric fundus.                       - Normal gastroesophageal junction and esophagus.                       - No specimens collected. Recommendation:       - Return patient to hospital ward for possible                        discharge same day.                       - Use Protonix (pantoprazole) 40 mg PO BID for 1 month.                       - Restart tube feeds Procedure Code(s):    --- Professional ---                       [phone], Esophagogastroduodenoscopy, flexible, transoral;                        diagnostic, including collection of specimen(s) by                        brushing or washing, when performed (separate procedure) Diagnosis Code(s):    --- Professional ---                       K25.9, Gastric ulcer, unspecified as acute or chronic,                        without hemorrhage or perforation  K31.89, Other diseases of stomach and duodenum                       K92.0, Hematemesis                       K92.1, Melena (includes Hematochezia) CPT copyright 2019 American Medical Association. All rights reserved. The codes documented in this report are preliminary and upon coder review may  be revised to meet current compliance requirements. Dr. Ulyess Mort Lin Landsman MD, MD 05/17/2019 11:04:27 AM This report has been signed electronically. Number of Addenda: 0 Note Initiated On: 05/17/2019 10:44 AM Estimated Blood Loss: Estimated blood loss: none.      [facility]

## 2019-05-17 NOTE — Progress Notes (Signed)
Initial Nutrition Assessment  DOCUMENTATION CODES:   Severe malnutrition in context of chronic illness, Underweight  INTERVENTION:  Patient's home TF regimen is Ensure Plus 1 bottle (237 mL) 5 times daily per G-tube. Ensure Plus is not available on formulary here. Discussed with husband and plan is to provide Osmolite 1.5 Cal while admitted.  Initiate Osmolite 1.5 Cal 1 carton (237 mL) 5 times daily per G-tube (0700, 1000, 1300, 1600, 1900). Provides 1775 kcal, 75 grams of protein, 905 mL H2O daily.  Provide free water flush of 30 mL before and after each bolus tube feeding. Provides a total of 1205 mL H2O daily.  NUTRITION DIAGNOSIS:   Severe Malnutrition related to chronic illness(stage IV SCC of head and neck, hx adenocarcinoma of colon) as evidenced by severe fat depletion, severe muscle depletion.  GOAL:   Patient will meet greater than or equal to 90% of their needs  MONITOR:   Labs, Weight trends, TF tolerance, I & O's  REASON FOR ASSESSMENT:   Malnutrition Screening Tool, Consult Enteral/tube feeding initiation and management  ASSESSMENT:   78 year old female with PMHx of OP, HLD, HTN, hx adenocarcinoma of colon, stage IV SCC of head and neck s/p chemotherapy, surgery, radiation, and most recently treated with nivolumab but now under surveillance, s/p G-tube placement on 6/7 at [facility] now admitted with GIB.   -Patient underwent EGD today. Findings included non-bleeding gastric ulcer. Plan is to resume tube feeds today. RD received consult for TF management/initiation.  Met with patient and husband at bedside. Patient resting on her side with eyes closed so history provided by husband. He provides care for her at home. He has been giving her Ensure Plus 5 bottles daily (0700, 1000, 1300, 1600, 1900). Provides 1750 kcal, 80 grams of protein, ~900 mL H2O daily from Ensure Plus. He does not flush with any water before the bolus but flushes with 30 mL water afterwards. She  receives 30-45 mL water with medications. She does not receive any other water throughout the day. He reports she has to get her tube replaced every 2.5-3 months. He reports the frequent replacements are due to the part of the tube that closes off the port breaking off. RD concerned clogging may be an issue with how little flushes patient is receiving. Will continue to monitor.  Husband reports patient is weight-stable at 110 lbs and has been so for a while now.  Enteral Access: G-tube present on admission  Medications reviewed and include: fentanyl patch, pantoprazole.   Labs reviewed: Chloride 114, CO2 21, BUN 29.  Discussed with RN.  NUTRITION - FOCUSED PHYSICAL EXAM:    Most Recent Value  Orbital Region  Severe depletion  Upper Arm Region  Severe depletion  Thoracic and Lumbar Region  Moderate depletion  Buccal Region  Severe depletion  Temple Region  Severe depletion  Clavicle Bone Region  Severe depletion  Clavicle and Acromion Bone Region  Severe depletion  Scapular Bone Region  Severe depletion  Dorsal Hand  Severe depletion  Patellar Region  Severe depletion  Anterior Thigh Region  Severe depletion  Posterior Calf Region  Severe depletion  Edema (RD Assessment)  None  Hair  Reviewed  Eyes  Unable to assess  Mouth  Unable to assess  Skin  Reviewed  Nails  Reviewed     Diet Order:   Diet Order    None     EDUCATION NEEDS:   No education needs have been identified at this time  Skin:  Skin Assessment: Reviewed RN Assessment  Last BM:  Unknown  Height:   Ht Readings from Last 1 Encounters:  05/16/19 5' 6.5" (1.689 m)   Weight:   Wt Readings from Last 1 Encounters:  05/16/19 49.9 kg   Ideal Body Weight:  60.2 kg  BMI:  Body mass index is 17.49 kg/m.  Estimated Nutritional Needs:   Kcal:  1500-1700  Protein:  75-85 grams  Fluid:  1.5-1.7 L/day  Willey Blade, MS, RD, LDN Office: [phone] Pager: [phone] After Hours/Weekend Pager:  [phone]

## 2019-05-17 NOTE — Anesthesia Postprocedure Evaluation (Signed)
Anesthesia Post Note  Patient: Dana Perez [facility]  Procedure(s) Performed: ESOPHAGOGASTRODUODENOSCOPY (EGD) (N/A )  Patient location during evaluation: Endoscopy Anesthesia Type: General Level of consciousness: awake and alert Pain management: pain level controlled Vital Signs Assessment: post-procedure vital signs reviewed and stable Respiratory status: spontaneous breathing, nonlabored ventilation, respiratory function stable and patient connected to nasal cannula oxygen Cardiovascular status: blood pressure returned to baseline and stable Postop Assessment: no apparent nausea or vomiting Anesthetic complications: no     Last Vitals:  Vitals:   05/17/19 1259 05/17/19 1300  BP: (!) 138/55 (!) 139/55  Pulse: 79 78  Resp: 20   Temp: 36.8 C 36.6 C  SpO2: 98% 97%    Last Pain:  Vitals:   05/17/19 1300  TempSrc: Axillary  PainSc:                  Ralph Brouwer S

## 2019-05-17 NOTE — Progress Notes (Signed)
Patient ID: Dana Perez, female   DOB: 1941/05/19, 78 y.o.   MRN: [id number]  [employer] PROGRESS NOTE  [facility] [id number] DOB: 11/11/1940 DOA: 05/16/2019 PCP: [name], MD  HPI/Subjective: Patient seen this morning.  She complained of dizziness.  No more nausea or vomiting.  No abdominal pain.  As per husband uses the PEG for feeding.  Objective: Vitals:   05/17/19 1259 05/17/19 1300  BP: (!) 138/55 (!) 139/55  Pulse: 79 78  Resp: 20   Temp: 98.2 F (36.8 C) 97.8 F (36.6 C)  SpO2: 98% 97%    Intake/Output Summary (Last 24 hours) at 05/17/2019 1424 Last data filed at 05/17/2019 1300 Gross per 24 hour  Intake 1946.65 ml  Output 850 ml  Net 1096.65 ml   Filed Weights   05/16/19 1251  Weight: 49.9 kg    ROS: Review of Systems  Constitutional: Negative for chills and fever.  Eyes: Negative for blurred vision.  Respiratory: Negative for cough and shortness of breath.   Cardiovascular: Negative for chest pain.  Gastrointestinal: Negative for abdominal pain, constipation, diarrhea, nausea and vomiting.  Genitourinary: Negative for dysuria.  Musculoskeletal: Negative for joint pain.  Neurological: Negative for dizziness and headaches.   Exam: Physical Exam  Constitutional: She is oriented to person, place, and time.  HENT:  Nose: No mucosal edema.  Mouth/Throat: No oropharyngeal exudate or posterior oropharyngeal edema.  Eyes: Pupils are equal, round, and reactive to light. Conjunctivae, EOM and lids are normal.  Neck: No JVD present. Carotid bruit is not present. No edema present. No thyroid mass and no thyromegaly present.  Cardiovascular: S1 normal and S2 normal. Exam reveals no gallop.  No murmur heard. Pulses:      Dorsalis pedis pulses are 2+ on the right side and 2+ on the left side.  Respiratory: No respiratory distress. She has no wheezes. She has no rhonchi. She has no rales.  GI: Soft. Bowel sounds are normal. There is no  abdominal tenderness.  Musculoskeletal:     Right ankle: She exhibits no swelling.     Left ankle: She exhibits no swelling.  Lymphadenopathy:    She has no cervical adenopathy.  Neurological: She is alert and oriented to person, place, and time. No cranial nerve deficit.  Skin: Skin is warm. Nails show no clubbing.  Postoperative changes right side of the face and neck with a large scab.  Psychiatric: She has a normal mood and affect.      Data Reviewed: Basic Metabolic Panel: Recent Labs  Lab 05/16/19 1257 05/17/19 0527  NA 138 141  K 4.2 3.8  CL 109 114*  CO2 20* 21*  GLUCOSE 106* 91  BUN 36* 29*  CREATININE 0.77 0.79  CALCIUM 9.0 8.0*  MG  --  2.1   Liver Function Tests: Recent Labs  Lab 05/16/19 1257  AST 32  ALT 25  ALKPHOS 91  BILITOT 0.5  PROT 6.7  ALBUMIN 3.7   CBC: Recent Labs  Lab 05/16/19 1257 05/16/19 2112 05/17/19 0048 05/17/19 0527  WBC 10.1  --   --  7.0  HGB 14.2 12.9 12.9 11.9*  HCT 43.4 39.3 39.7 36.0  MCV 91.9  --   --  91.6  PLT 294  --   --  238     Recent Results (from the past 240 hour(s))  SARS Coronavirus 2 [facility] order, Performed in [facility] hospital lab) Nasopharyngeal Nasopharyngeal Swab     Status: None  Collection Time: 05/16/19  4:09 PM   Specimen: Nasopharyngeal Swab  Result Value Ref Range Status   SARS Coronavirus 2 NEGATIVE NEGATIVE Final    Comment: (NOTE) If result is NEGATIVE SARS-CoV-2 target nucleic acids are NOT DETECTED. The SARS-CoV-2 RNA is generally detectable in upper and lower  respiratory specimens during the acute phase of infection. The lowest  concentration of SARS-CoV-2 viral copies this assay can detect is 250  copies / mL. A negative result does not preclude SARS-CoV-2 infection  and should not be used as the sole basis for treatment or other  patient management decisions.  A negative result may occur with  improper specimen collection / handling, submission of specimen other  than  nasopharyngeal swab, presence of viral mutation(s) within the  areas targeted by this assay, and inadequate number of viral copies  (<250 copies / mL). A negative result must be combined with clinical  observations, patient history, and epidemiological information. If result is POSITIVE SARS-CoV-2 target nucleic acids are DETECTED. The SARS-CoV-2 RNA is generally detectable in upper and lower  respiratory specimens dur ing the acute phase of infection.  Positive  results are indicative of active infection with SARS-CoV-2.  Clinical  correlation with patient history and other diagnostic information is  necessary to determine patient infection status.  Positive results do  not rule out bacterial infection or co-infection with other viruses. If result is PRESUMPTIVE POSTIVE SARS-CoV-2 nucleic acids MAY BE PRESENT.   A presumptive positive result was obtained on the submitted specimen  and confirmed on repeat testing.  While 2019 novel coronavirus  (SARS-CoV-2) nucleic acids may be present in the submitted sample  additional confirmatory testing may be necessary for epidemiological  and / or clinical management purposes  to differentiate between  SARS-CoV-2 and other Sarbecovirus currently known to infect humans.  If clinically indicated additional testing with an alternate test  methodology [phone]) is advised. The SARS-CoV-2 RNA is generally  detectable in upper and lower respiratory sp ecimens during the acute  phase of infection. The expected result is Negative. Fact Sheet for Patients:  StrictlyIdeas.no Fact Sheet for Healthcare Providers: BankingDealers.co.za This test is not yet approved or cleared by the [place] FDA and has been authorized for detection and/or diagnosis of SARS-CoV-2 by FDA under an Emergency Use Authorization (EUA).  This EUA will remain in effect (meaning this test can be used) for the duration of  the COVID-19 declaration under Section 564(b)(1) of the Act, 21 U.S.C. section 360bbb-3(b)(1), unless the authorization is terminated or revoked sooner. Performed at [facility], [address], [place], [place] [address]      Studies: US Carotid Bilateral  Result Date: 05/17/2019 CLINICAL DATA:  Carotid artery stenosis. EXAM: BILATERAL CAROTID DUPLEX ULTRASOUND TECHNIQUE: Pearline Cables scale imaging, color Doppler and duplex ultrasound were performed of bilateral carotid and vertebral arteries in the neck. COMPARISON:  CT of the neck dated March 07, 2019. FINDINGS: Criteria: Quantification of carotid stenosis is based on velocity parameters that correlate the residual internal carotid diameter with NASCET-based stenosis levels, using the diameter of the distal internal carotid lumen as the denominator for stenosis measurement. The following velocity measurements were obtained: RIGHT ICA: 70 cm/sec CCA: 76 cm/sec SYSTOLIC ICA/CCA RATIO:  0.9 ECA: 79 cm/sec LEFT ICA: 115 cm/sec CCA: 53 cm/sec SYSTOLIC ICA/CCA RATIO:  2 ECA: 87 cm/sec RIGHT CAROTID ARTERY: There are atherosclerotic changes throughout the right common carotid artery resulting in less than 50% stenosis by grayscale imaging in the  proximal portion of the artery. In the distal portion of the artery there may be greater than 50% narrowing by grayscale imaging. There is less than 50% stenosis by grayscale imaging involving the right ICA. RIGHT VERTEBRAL ARTERY:  Antegrade flow is noted. LEFT CAROTID ARTERY: Atherosclerotic plaque is noted in the left common carotid artery with less than 50% stenosis by grayscale imaging. Atherosclerotic plaque is noted in the carotid bulb. There is moderate atherosclerotic plaque involving the left ICA resulting in greater than 50% stenosis by grayscale imaging. LEFT VERTEBRAL ARTERY:  Antegrade flow is noted. IMPRESSION: 1. Atherosclerotic disease of the right ICA resulting in left than 50% stenosis by both  color Doppler and gray scale imaging. 2. Atherosclerotic disease involving the left ICA likely resulting in less than 50% stenosis of the left ICA despite a borderline ICA/CCA ratio. Electronically Signed   By: Constance Holster M.D.   On: 05/17/2019 12:23    Scheduled Meds: . feeding supplement (OSMOLITE 1.5 CAL)  237 mL Per Tube 5 X Daily  . fentaNYL  1 patch Transdermal Q72H  . pantoprazole (PROTONIX) IV  40 mg Intravenous Q12H   Continuous Infusions:  Assessment/Plan:  1. Upper GI bleed and melena.  Endoscopy showed a gastric ulcer.  GI recommends PPI twice daily.  We will give IV while here.  May have to do Prevacid via PEG at home. 2. Squamous cell cancer of the head and neck.  Follows up with Dr. Pryor Ochoa as outpatient.  3. Dizziness.  Likely from previous surgery.  Would like to check orthostatic vital signs.  Would like to get physical therapy evaluation.  Carotid ultrasound does show some plaque but not enough to do any thing surgical with. 4. Severe malnutrition in context of chronic illness.  Restart tube feeds. 5. Chronic pain try to taper pain meds as outpatient. 6. Weakness.  Physical therapy evaluation.  Code Status:     Code Status Orders  (From admission, onward)         Start     Ordered   05/16/19 1723  Full code  Continuous     05/16/19 1722        Code Status History    Date Active Date Inactive Code Status Order ID Comments User Context   05/20/2018 1031 05/22/2018 0917 DNR [id number]  Irean Hong, NP Outpatient   11/16/2017 1111 11/17/2017 1552 DNR [id number]  Bettey Costa, MD Inpatient   11/15/2017 2301 11/16/2017 1111 Full Code [id number]  Idelle Crouch, MD Inpatient   08/07/2017 1646 08/10/2017 1852 Full Code [id number]  Demetrios Loll, MD Inpatient   Advance Care Planning Activity    Advance Directive Documentation     Most Recent Value  Type of Advance Directive  Living will  Pre-existing out of facility DNR order (yellow form or pink MOST form)  -   "MOST" Form in Place?  -     Family Communication: Spoke with husband on the phone Disposition Plan: Likely discharge tomorrow morning  Consultants:  Gastroenterology  Procedures:  EGD  Time spent: 28 minutes  [place]

## 2019-05-17 NOTE — Anesthesia Preprocedure Evaluation (Signed)
Anesthesia Evaluation  Patient identified by MRN, date of birth, ID band Patient awake    Reviewed: Allergy & Precautions, NPO status , Patient's Chart, lab work & pertinent test results, reviewed documented beta blocker date and time   Airway Mallampati: II  TM Distance: >3 FB     Dental  (+) Chipped   Pulmonary pneumonia, resolved, Patient abstained from smoking., former smoker,           Cardiovascular hypertension, Pt. on medications      Neuro/Psych    GI/Hepatic   Endo/Other    Renal/GU Renal disease     Musculoskeletal   Abdominal   Peds  Hematology   Anesthesia Other Findings Neck ca. Colon ca. Hx of resp problems. Has duragesic patch.  Reproductive/Obstetrics                             Anesthesia Physical Anesthesia Plan  ASA: III  Anesthesia Plan: General   Post-op Pain Management:    Induction: Intravenous  PONV Risk Score and Plan:   Airway Management Planned:   Additional Equipment:   Intra-op Plan:   Post-operative Plan:   Informed Consent: I have reviewed the patients History and Physical, chart, labs and discussed the procedure including the risks, benefits and alternatives for the proposed anesthesia with the patient or authorized representative who has indicated his/her understanding and acceptance.       Plan Discussed with: CRNA  Anesthesia Plan Comments:         Anesthesia Quick Evaluation

## 2019-05-17 NOTE — Anesthesia Post-op Follow-up Note (Signed)
Anesthesia QCDR form completed.        

## 2019-05-17 NOTE — Transfer of Care (Signed)
Immediate Anesthesia Transfer of Care Note  Patient: Dana Perez  Procedure(s) Performed: ESOPHAGOGASTRODUODENOSCOPY (EGD) (N/A )  Patient Location: PACU  Anesthesia Type:General  Level of Consciousness: sedated  Airway & Oxygen Therapy: Patient Spontanous Breathing and Patient connected to nasal cannula oxygen  Post-op Assessment: Report given to RN and Post -op Vital signs reviewed and stable  Post vital signs: Reviewed and stable  Last Vitals:  Vitals Value Taken Time  BP 94/55 05/17/19 1105  Temp 36.4 C 05/17/19 1105  Pulse 92 05/17/19 1105  Resp 21 05/17/19 1105  SpO2 97 % 05/17/19 1105  Vitals shown include unvalidated device data.  Last Pain:  Vitals:   05/17/19 1100  TempSrc: Tympanic  PainSc:          Complications: No apparent anesthesia complications

## 2019-05-17 NOTE — Progress Notes (Signed)
PT Cancellation Note:  Pt currently off unit for procedure.  Will re-attempt PT evaluation at a later date/time as medically appropriate.  Leitha Bleak, PT 05/17/19, 12:40 PM [phone]

## 2019-05-18 ENCOUNTER — Encounter: Payer: Self-pay | Admitting: Gastroenterology

## 2019-05-18 DIAGNOSIS — E43 Unspecified severe protein-calorie malnutrition: Secondary | ICD-10-CM | POA: Insufficient documentation

## 2019-05-18 MED ORDER — LANSOPRAZOLE 30 MG PO TBDD: 30.0000 mg | DELAYED_RELEASE_TABLET | Freq: Two times a day (BID) | ORAL | 0 refills | Status: DC

## 2019-05-18 NOTE — TOC Transition Note (Signed)
Transition of Care [facility]) - CM/SW Discharge Note   Patient Details  Name: Dana Perez MRN: [id number] Date of Birth: [birth year]  [facility] CM/SW Contact:  Beverly Sessions, RN Phone Number: 05/18/2019, 3:28 PM   Clinical Narrative:    Husband at bedside Patient to discharge home today.  MD has placed order home RN, PT, aide.  Husband would like to use [facility] home health.  Patient states she already has a WC, RW, cane, and BSC in the home. Referral made and accepted by [place] with [facility]   Final next level of care: [place] Barriers to Discharge: No Barriers Identified   Patient Goals and CMS Choice     Choice offered to / list presented to : Patient  Discharge Placement                       Discharge Plan and Services                          HH Arranged: RN, PT, Nurse's Aide [facility] Agency: [facility] Date [facility] Agency Contacted: 05/18/19      Social Determinants of Health (SDOH) Interventions     Readmission Risk Interventions No flowsheet data found.

## 2019-05-18 NOTE — Evaluation (Signed)
Physical Therapy Evaluation Patient Details Name: Dana Perez MRN: [id number] DOB: 1941/04/30 Today's Date: 05/18/2019   History of Present Illness  Pt is a 78 y.o. female presenting to hospital 05/16/19 with hematemesis and melena.  Pt admitted with GI bleed, N/V, and abdominal pain.  Pt s/p EGD 9/29 showing gastric ulcer.  PMH includes stage IV head and neck squamous cell carcinoma (s/p chemo, surgery, and radiation), PEG for feeding, vertigo, colon CA, htn, HCAP, h/o wrist fx surgery, and h/o ORIF L ankle fx.  Clinical Impression  Prior to hospital admission, pt reports her husband assisted her with all mobility and that she walked household distances with walker.  Pt lives with her husband in 1 level home with steps to enter with railing.  Currently pt is modified independent with bed mobility; CGA with transfers; and CGA ambulating 20 feet with RW.  Pt reporting h/o dizziness with transitions (nurse reports recently taking orthostatics with pt with no significant concerns noted).  Pt reporting dizziness sitting edge of bed initially but resolved within a couple minutes.  Pt then reporting dizziness after 1st stand (pt reports it takes a couple stands for dizziness to improve typically and will just sit back down if needed d/t dizziness).  2nd stand pt reporting no dizziness and agreeable to walking but pt then reporting dizziness after 20 feet of ambulation and needing to sit down (pt assisted to sitting in recliner and pt then requesting to go back to bed to rest; pt able to transfer recliner to bed safely with RW and CGA).  Pt reports that her husband will push her around in manual w/c as needed in the home.  Pt would benefit from skilled PT to address noted impairments and functional limitations (see below for any additional details).  Upon hospital discharge, recommend pt discharge with HHPT and 24/7 assist; anticipate pt may need to be w/c level with functional mobility initially for safety.     Follow Up Recommendations Home health PT;Supervision/Assistance - 24 hour    Equipment Recommendations  Rolling walker with 5" wheels;3in1 (PT);Wheelchair (measurements PT);Wheelchair cushion (measurements PT)    Recommendations for Other Services       Precautions / Restrictions Precautions Precautions: Fall Precaution Comments: PEG tube Restrictions Weight Bearing Restrictions: No      Mobility  Bed Mobility Overal bed mobility: Modified Independent             General bed mobility comments: Semi-supine to/from sit without any noted difficulties  Transfers Overall transfer level: Needs assistance Equipment used: Rolling walker (2 wheeled) Transfers: Sit to/from [facility] Sit to Stand: Min guard Stand pivot transfers: Min guard       General transfer comment: x2 trials standing from bed; x1 trial standing from recliner; stand step turn recliner to bed with RW; all transfers steady with fairly strong stand and controlled descent sitting  Ambulation/Gait Ambulation/Gait assistance: Min guard Gait Distance (Feet): 20 Feet Assistive device: Rolling walker (2 wheeled)   Gait velocity: mildly decreased   General Gait Details: partial to mostly step through gait pattern; steady with RW; limited distance d/t reports of dizziness  Stairs            Wheelchair Mobility    Modified Rankin (Stroke Patients Only)       Balance Overall balance assessment: Needs assistance Sitting-balance support: No upper extremity supported;Feet supported Sitting balance-Leahy Scale: Good Sitting balance - Comments: steady sitting reaching within BOS   Standing balance support: Single extremity  supported Standing balance-Leahy Scale: Poor Standing balance comment: pt requiring at least single UE support for static standing balance                             Pertinent Vitals/Pain Pain Assessment: No/denies pain  Vitals (HR and O2 on room  air) stable and WFL throughout treatment session.    Home Living Family/patient expects to be discharged to:: Private residence Living Arrangements: Spouse/significant other Available Help at Discharge: Family;Available 24 hours/day Type of Home: House Home Access: Stairs to enter Entrance Stairs-Rails: (pt reports having a railing but not sure what side) Entrance Stairs-Number of Steps: 6 Home Layout: One level Home Equipment: Grab bars - tub/shower;Walker - 2 wheels;Wheelchair - manual      Prior Function Level of Independence: Needs assistance   Gait / Transfers Assistance Needed: Pt reports ambulating household distances with RW but her husband will push her in manual w/c in home also as needed.  ADL's / Homemaking Assistance Needed: Takes sponge baths.        Hand Dominance        Extremity/Trunk Assessment   Upper Extremity Assessment Upper Extremity Assessment: Generalized weakness    Lower Extremity Assessment Lower Extremity Assessment: Generalized weakness       Communication   Communication: HOH  Cognition Arousal/Alertness: (Pt appearing tired) Behavior During Therapy: WFL for tasks assessed/performed Overall Cognitive Status: No family/caregiver present to determine baseline cognitive functioning                                 General Comments: Oriented to person, place, and situation.  Some generalized confusion noted during session.      General Comments   Nursing cleared pt for participation in physical therapy.  Pt agreeable to PT session.    Exercises General Exercises - Lower Extremity Ankle Circles/Pumps: AROM;Strengthening;Both;10 reps;Seated Long Arc Quad: AROM;Strengthening;10 reps;Seated Hip Flexion/Marching: AROM;Strengthening;Both;10 reps;Seated;Other (comment)(also x7 reps standing B LE marching with UE support on RW (limited d/t pt reporting dizziness in standing))   Assessment/Plan    PT Assessment Patient needs  continued PT services  PT Problem List Decreased strength;Decreased activity tolerance;Decreased balance;Decreased mobility       PT Treatment Interventions DME instruction;Gait training;Stair training;Functional mobility training;Therapeutic activities;Therapeutic exercise;Balance training;Patient/family education    PT Goals (Current goals can be found in the Care Plan section)  Acute Rehab PT Goals Patient Stated Goal: to get some rest PT Goal Formulation: With patient Time For Goal Achievement: 06/01/19 Potential to Achieve Goals: Good    Frequency Min 2X/week   Barriers to discharge        Co-evaluation               AM-PAC PT "6 Clicks" Mobility  Outcome Measure Help needed turning from your back to your side while in a flat bed without using bedrails?: None Help needed moving from lying on your back to sitting on the side of a flat bed without using bedrails?: None Help needed moving to and from a bed to a chair (including a wheelchair)?: A Little Help needed standing up from a chair using your arms (e.g., wheelchair or bedside chair)?: A Little Help needed to walk in hospital room?: A Little Help needed climbing 3-5 steps with a railing? : A Little 6 Click Score: 20    End of Session Equipment Utilized During Treatment: Gait  belt(gait belt up high away from PEG tube) Activity Tolerance: Patient limited by fatigue Patient left: in bed;with call bell/phone within reach;with bed alarm set Nurse Communication: Mobility status;Precautions;Other (comment)(NT notified of need for Purewick to be replaced) PT Visit Diagnosis: Other abnormalities of gait and mobility (R26.89);Muscle weakness (generalized) (M62.81);Difficulty in walking, not elsewhere classified (R26.2)    Time: [id number] PT Time Calculation (min) (ACUTE ONLY): 25 min   Charges:   PT Evaluation $PT Eval Low Complexity: 1 Low PT Treatments $Therapeutic Exercise: 8-22 mins       Leitha Bleak,  PT 05/18/19, 10:57 AM [phone]

## 2019-05-18 NOTE — Progress Notes (Signed)
Dana Perez  A and O x 3. VSS. Pt tolerating diet well. No complaints of pain or nausea. IV removed intact, prescriptions given to pt's husband. Pt and pt's husband voiced understanding of discharge instructions with no further questions. Pt discharged via wheelchair with NT.     Allergies as of 05/18/2019      Reactions   Potassium Nausea Only, Nausea And Vomiting   Other Other (See Comments), Rash   TDAP tdap TDAP   Tetanus Antitoxin Rash      Medication List    STOP taking these medications   ibuprofen 200 MG tablet Commonly known as: ADVIL   metoCLOPramide 10 MG tablet Commonly known as: REGLAN     TAKE these medications   cholestyramine 4 GM/DOSE powder Commonly known as: QUESTRAN Take 1 packet (4 g total) by mouth 3 (three) times daily with meals.   clotrimazole-betamethasone cream Commonly known as: LOTRISONE APPLY TO AFFECTED AREA TWICE A DAY   Ensure Plus Liqd Give 1 carton of ensure plus 5 times daily via feeding tube.  Flush with 56m of water before and after feeding.   fentaNYL 25 MCG/HR Commonly known as: DIreton1 patch onto the skin every 3 (three) days.   lansoprazole 30 MG disintegrating tablet Commonly known as: PREVACID SOLUTAB Place 1 tablet (30 mg total) into feeding tube 2 (two) times daily. Notes to patient: 05/18/19   morphine 20 MG/ML concentrated solution Commonly known as: ROXANOL Place 0.25 mLs (5 mg total) into feeding tube every 4 (four) hours as needed for moderate pain or severe pain.   Narcan 4 MG/0.1ML Liqd nasal spray kit Generic drug: naloxone USE 1 SPRAY PER INTRANASAL ROUTE AS DIRECTED   PEG 3350 17 GM/SCOOP Powd Take 17 g by mouth 1 day or 1 dose.   pravastatin 10 MG tablet Commonly known as: PRAVACHOL Take 10 mg by mouth daily.   prochlorperazine 10 MG tablet Commonly known as: COMPAZINE Take 1 tablet (10 mg total) by mouth every 6 (six) hours as needed for nausea or vomiting.       Vitals:   05/18/19 0618 05/18/19 1238  BP: (!) 114/57 117/61  Pulse: 78 82  Resp: 18   Temp: 98.2 F (36.8 C) (!) 97.3 F (36.3 C)  SpO2: 95% 95%    JFrancesco Sor

## 2019-05-18 NOTE — Discharge Summary (Signed)
[facility] NAME: Dana Perez    MR#:  [id number]  DATE OF BIRTH:  Nov 07, 1940  DATE OF ADMISSION:  05/16/2019 ADMITTING PHYSICIAN: Otila Back, MD  DATE OF DISCHARGE: 05/18/2019  1:23 PM  PRIMARY CARE PHYSICIAN: [name], MD    ADMISSION DIAGNOSIS:  Carotid artery stenosis [I65.29] Melena [K92.1] Hematemesis with nausea [K92.0]  DISCHARGE DIAGNOSIS:  Active Problems:   GI bleed   Melena   Protein-calorie malnutrition, severe   SECONDARY DIAGNOSIS:   Past Medical History:  Diagnosis Date  . Adenocarcinoma of colon ([place])   . Benign neoplasm of ascending colon   . Essential hypertension 06/13/2014  . Goals of care, counseling/discussion 07/14/2017  . HLD (hyperlipidemia) 06/13/2014  . Hypercholesteremia   . Hyperlipidemia   . Multiple thyroid nodules 01/22/2012  . Osteoporosis   . Polycythemia, secondary 12/26/2014  . Pure hypercholesterolemia 03/15/2015  . Squamous cell carcinoma of head and neck ([place])   . Thyroid nodule     HOSPITAL COURSE:   1.  Upper GI bleed with melena.  Endoscopy showed a gastric ulcer that was nonbleeding.  GI recommends PPI twice a day.  I did prescribe Prevacid Solutab's because the patient has a feeding tube.  GI was okay with restarting tube feeds yesterday. 2.  Squamous cell cancer of the head and neck.  Follows up with Dr. Pryor Ochoa as outpatient. 3.  Dizziness.  Likely from previous surgery.  The patient is not orthostatic.  Patient will go home with the patient's husband.  Patient and husband deferred home health.  I ordered it just in case they wanted it and care manager was supposed to talk with them about that. 4.  Severe malnutrition in context of chronic illness.  Restarted tube feeds. 5.  Chronic pain. 6.  Weakness.  Appreciate physical therapy consultation  DISCHARGE CONDITIONS:   Fair  CONSULTS OBTAINED:  Gastroenterology  DRUG ALLERGIES:   Allergies  Allergen  Reactions  . Potassium Nausea Only and Nausea And Vomiting  . Other Other (See Comments) and Rash    TDAP tdap TDAP  . Tetanus Antitoxin Rash    DISCHARGE MEDICATIONS:   Allergies as of 05/18/2019      Reactions   Potassium Nausea Only, Nausea And Vomiting   Other Other (See Comments), Rash   TDAP tdap TDAP   Tetanus Antitoxin Rash      Medication List    STOP taking these medications   ibuprofen 200 MG tablet Commonly known as: ADVIL   metoCLOPramide 10 MG tablet Commonly known as: REGLAN     TAKE these medications   cholestyramine 4 GM/DOSE powder Commonly known as: QUESTRAN Take 1 packet (4 g total) by mouth 3 (three) times daily with meals.   clotrimazole-betamethasone cream Commonly known as: LOTRISONE APPLY TO AFFECTED AREA TWICE A DAY   Ensure Plus Liqd Give 1 carton of ensure plus 5 times daily via feeding tube.  Flush with 39m of water before and after feeding.   fentaNYL 25 MCG/HR Commonly known as: [place]1 patch onto the skin every 3 (three) days.   lansoprazole 30 MG disintegrating tablet Commonly known as: PREVACID SOLUTAB Place 1 tablet (30 mg total) into feeding tube 2 (two) times daily. Notes to patient: 05/18/19   morphine 20 MG/ML concentrated solution Commonly known as: ROXANOL Place 0.25 mLs (5 mg total) into feeding tube every 4 (four) hours as needed for moderate pain or severe pain.  Narcan 4 MG/0.1ML Liqd nasal spray kit Generic drug: naloxone USE 1 SPRAY PER INTRANASAL ROUTE AS DIRECTED   PEG 3350 17 GM/SCOOP Powd Take 17 g by mouth 1 day or 1 dose.   pravastatin 10 MG tablet Commonly known as: PRAVACHOL Take 10 mg by mouth daily.   prochlorperazine 10 MG tablet Commonly known as: COMPAZINE Take 1 tablet (10 mg total) by mouth every 6 (six) hours as needed for nausea or vomiting.        DISCHARGE INSTRUCTIONS:   Follow-up PMD 5 days Follow-up Dr. Marius Ditch gastroenterology 1 month  If you experience  worsening of your admission symptoms, develop shortness of breath, life threatening emergency, suicidal or homicidal thoughts you must seek medical attention immediately by calling 911 or calling your MD immediately  if symptoms less severe.  You Must read complete instructions/literature along with all the possible adverse reactions/side effects for all the Medicines you take and that have been prescribed to you. Take any new Medicines after you have completely understood and accept all the possible adverse reactions/side effects.   Please note  You were cared for by a hospitalist during your hospital stay. If you have any questions about your discharge medications or the care you received while you were in the hospital after you are discharged, you can call the unit and asked to speak with the hospitalist on call if the hospitalist that took care of you is not available. Once you are discharged, your primary care physician will handle any further medical issues. Please note that NO REFILLS for any discharge medications will be authorized once you are discharged, as it is imperative that you return to your primary care physician (or establish a relationship with a primary care physician if you do not have one) for your aftercare needs so that they can reassess your need for medications and monitor your lab values.    Today   CHIEF COMPLAINT:   Chief Complaint  Patient presents with  . Dizziness    HISTORY OF PRESENT ILLNESS:  Dana Perez  is a 78 y.o. female came in with upper GI bleed and melena.   VITAL SIGNS:  Blood pressure 117/61, pulse 82, temperature (!) 97.3 F (36.3 C), temperature source Oral, resp. rate 18, height 5' 6.5" (1.689 m), weight 49.9 kg, SpO2 95 %.    PHYSICAL EXAMINATION:  GENERAL:  78 y.o.-year-old patient lying in the bed with no acute distress.  EYES: Pupils equal, round, reactive to light and accommodation. No scleral icterus. Extraocular muscles intact.   HEENT: Head atraumatic, normocephalic. Oropharynx and nasopharynx clear.  NECK:  Supple, no jugular venous distention. No thyroid enlargement, no tenderness.  LUNGS: Normal breath sounds bilaterally, no wheezing, rales,rhonchi or crepitation. No use of accessory muscles of respiration.  CARDIOVASCULAR: S1, S2 normal. No murmurs, rubs, or gallops.  ABDOMEN: Soft, non-tender, non-distended. Bowel sounds present. No organomegaly or mass.  EXTREMITIES: No pedal edema, cyanosis, or clubbing.  NEUROLOGIC: Cranial nerves II through XII are intact. Muscle strength 5/5 in all extremities. Sensation intact. Gait not checked.  PSYCHIATRIC: The patient is alert and answers a few questions.  SKIN: Large scab on right face  DATA REVIEW:   CBC Recent Labs  Lab 05/17/19 0527  WBC 7.0  HGB 11.9*  HCT 36.0  PLT 238    Chemistries  Recent Labs  Lab 05/16/19 1257 05/17/19 0527  NA 138 141  K 4.2 3.8  CL 109 114*  CO2 20* 21*  GLUCOSE 106*  91  BUN 36* 29*  CREATININE 0.77 0.79  CALCIUM 9.0 8.0*  MG  --  2.1  AST 32  --   ALT 25  --   ALKPHOS 91  --   BILITOT 0.5  --      Microbiology Results  Results for orders placed or performed during the hospital encounter of 05/16/19  SARS Coronavirus 2 [facility] order, Performed in [facility] hospital lab) Nasopharyngeal Nasopharyngeal Swab     Status: None   Collection Time: 05/16/19  4:09 PM   Specimen: Nasopharyngeal Swab  Result Value Ref Range Status   SARS Coronavirus 2 NEGATIVE NEGATIVE Final    Comment: (NOTE) If result is NEGATIVE SARS-CoV-2 target nucleic acids are NOT DETECTED. The SARS-CoV-2 RNA is generally detectable in upper and lower  respiratory specimens during the acute phase of infection. The lowest  concentration of SARS-CoV-2 viral copies this assay can detect is 250  copies / mL. A negative result does not preclude SARS-CoV-2 infection  and should not be used as the sole basis for treatment or other  patient  management decisions.  A negative result may occur with  improper specimen collection / handling, submission of specimen other  than nasopharyngeal swab, presence of viral mutation(s) within the  areas targeted by this assay, and inadequate number of viral copies  (<250 copies / mL). A negative result must be combined with clinical  observations, patient history, and epidemiological information. If result is POSITIVE SARS-CoV-2 target nucleic acids are DETECTED. The SARS-CoV-2 RNA is generally detectable in upper and lower  respiratory specimens dur ing the acute phase of infection.  Positive  results are indicative of active infection with SARS-CoV-2.  Clinical  correlation with patient history and other diagnostic information is  necessary to determine patient infection status.  Positive results do  not rule out bacterial infection or co-infection with other viruses. If result is PRESUMPTIVE POSTIVE SARS-CoV-2 nucleic acids MAY BE PRESENT.   A presumptive positive result was obtained on the submitted specimen  and confirmed on repeat testing.  While 2019 novel coronavirus  (SARS-CoV-2) nucleic acids may be present in the submitted sample  additional confirmatory testing may be necessary for epidemiological  and / or clinical management purposes  to differentiate between  SARS-CoV-2 and other Sarbecovirus currently known to infect humans.  If clinically indicated additional testing with an alternate test  methodology [phone]) is advised. The SARS-CoV-2 RNA is generally  detectable in upper and lower respiratory sp ecimens during the acute  phase of infection. The expected result is Negative. Fact Sheet for Patients:  StrictlyIdeas.no Fact Sheet for Healthcare Providers: BankingDealers.co.za This test is not yet approved or cleared by the [place] FDA and has been authorized for detection and/or diagnosis of SARS-CoV-2 by FDA under  an Emergency Use Authorization (EUA).  This EUA will remain in effect (meaning this test can be used) for the duration of the COVID-19 declaration under Section 564(b)(1) of the Act, 21 U.S.C. section 360bbb-3(b)(1), unless the authorization is terminated or revoked sooner. Performed at [facility], [address], [place], [place] [address]     RADIOLOGY:  US Carotid Bilateral  Result Date: 05/17/2019 CLINICAL DATA:  Carotid artery stenosis. EXAM: BILATERAL CAROTID DUPLEX ULTRASOUND TECHNIQUE: Pearline Cables scale imaging, color Doppler and duplex ultrasound were performed of bilateral carotid and vertebral arteries in the neck. COMPARISON:  CT of the neck dated March 07, 2019. FINDINGS: Criteria: Quantification of carotid stenosis is based on velocity parameters that  correlate the residual internal carotid diameter with NASCET-based stenosis levels, using the diameter of the distal internal carotid lumen as the denominator for stenosis measurement. The following velocity measurements were obtained: RIGHT ICA: 70 cm/sec CCA: 76 cm/sec SYSTOLIC ICA/CCA RATIO:  0.9 ECA: 79 cm/sec LEFT ICA: 115 cm/sec CCA: 53 cm/sec SYSTOLIC ICA/CCA RATIO:  2 ECA: 87 cm/sec RIGHT CAROTID ARTERY: There are atherosclerotic changes throughout the right common carotid artery resulting in less than 50% stenosis by grayscale imaging in the proximal portion of the artery. In the distal portion of the artery there may be greater than 50% narrowing by grayscale imaging. There is less than 50% stenosis by grayscale imaging involving the right ICA. RIGHT VERTEBRAL ARTERY:  Antegrade flow is noted. LEFT CAROTID ARTERY: Atherosclerotic plaque is noted in the left common carotid artery with less than 50% stenosis by grayscale imaging. Atherosclerotic plaque is noted in the carotid bulb. There is moderate atherosclerotic plaque involving the left ICA resulting in greater than 50% stenosis by grayscale imaging. LEFT VERTEBRAL ARTERY:   Antegrade flow is noted. IMPRESSION: 1. Atherosclerotic disease of the right ICA resulting in left than 50% stenosis by both color Doppler and gray scale imaging. 2. Atherosclerotic disease involving the left ICA likely resulting in less than 50% stenosis of the left ICA despite a borderline ICA/CCA ratio. Electronically Signed   By: Constance Holster M.D.   On: 05/17/2019 12:23    Management plans discussed with the patient, family and they are in agreement.  CODE STATUS:     Code Status Orders  (From admission, onward)         Start     Ordered   05/16/19 1723  Full code  Continuous     05/16/19 1722        Code Status History    Date Active Date Inactive Code Status Order ID Comments User Context   05/20/2018 1031 05/22/2018 0917 DNR [id number]  Irean Hong, NP Outpatient   11/16/2017 1111 11/17/2017 1552 DNR [id number]  Bettey Costa, MD Inpatient   11/15/2017 2301 11/16/2017 1111 Full Code [id number]  Idelle Crouch, MD Inpatient   08/07/2017 1646 08/10/2017 1852 Full Code [id number]  Demetrios Loll, MD Inpatient   Advance Care Planning Activity    Advance Directive Documentation     Most Recent Value  Type of Advance Directive  Living will  Pre-existing out of facility DNR order (yellow form or pink MOST form)  -  "MOST" Form in Place?  -      TOTAL TIME TAKING CARE OF THIS PATIENT: 35 minutes.    Loletha Grayer M.D on 05/18/2019 at 2:56 PM  Between 7am to 6pm - Pager - [phone]  After 6pm go to [email or web address] - password [email or web address]  [facility] Office  [phone]  CC: Primary care physician; [name], MD

## 2019-05-19 ENCOUNTER — Other Ambulatory Visit: Payer: Self-pay | Admitting: Hospice and Palliative Medicine

## 2019-05-19 MED ORDER — FENTANYL 12 MCG/HR TD PT72: 1.0000 | MEDICATED_PATCH | TRANSDERMAL | 0 refills | Status: DC

## 2019-05-19 NOTE — Progress Notes (Signed)
Will reduce dose of fentanyl and refill at 12.27mcg Q72H. Plan to discontinue after two weeks. Will then utilize morphine prn for BTP.

## 2019-05-20 DIAGNOSIS — H8112 Benign paroxysmal vertigo, left ear: Secondary | ICD-10-CM | POA: Diagnosis not present

## 2019-05-21 DIAGNOSIS — Z9181 History of falling: Secondary | ICD-10-CM | POA: Diagnosis not present

## 2019-05-21 DIAGNOSIS — I129 Hypertensive chronic kidney disease with stage 1 through stage 4 chronic kidney disease, or unspecified chronic kidney disease: Secondary | ICD-10-CM | POA: Diagnosis not present

## 2019-05-21 DIAGNOSIS — Z931 Gastrostomy status: Secondary | ICD-10-CM | POA: Diagnosis not present

## 2019-05-21 DIAGNOSIS — Z87891 Personal history of nicotine dependence: Secondary | ICD-10-CM | POA: Diagnosis not present

## 2019-05-21 DIAGNOSIS — C4442 Squamous cell carcinoma of skin of scalp and neck: Secondary | ICD-10-CM | POA: Diagnosis not present

## 2019-05-21 DIAGNOSIS — I6529 Occlusion and stenosis of unspecified carotid artery: Secondary | ICD-10-CM | POA: Diagnosis not present

## 2019-05-21 DIAGNOSIS — Z8701 Personal history of pneumonia (recurrent): Secondary | ICD-10-CM | POA: Diagnosis not present

## 2019-05-21 DIAGNOSIS — Z79891 Long term (current) use of opiate analgesic: Secondary | ICD-10-CM | POA: Diagnosis not present

## 2019-05-21 DIAGNOSIS — E46 Unspecified protein-calorie malnutrition: Secondary | ICD-10-CM | POA: Diagnosis not present

## 2019-05-21 DIAGNOSIS — M810 Age-related osteoporosis without current pathological fracture: Secondary | ICD-10-CM | POA: Diagnosis not present

## 2019-05-21 DIAGNOSIS — N183 Chronic kidney disease, stage 3 unspecified: Secondary | ICD-10-CM | POA: Diagnosis not present

## 2019-05-21 DIAGNOSIS — Z85038 Personal history of other malignant neoplasm of large intestine: Secondary | ICD-10-CM | POA: Diagnosis not present

## 2019-05-21 DIAGNOSIS — K259 Gastric ulcer, unspecified as acute or chronic, without hemorrhage or perforation: Secondary | ICD-10-CM | POA: Diagnosis not present

## 2019-05-21 DIAGNOSIS — E7800 Pure hypercholesterolemia, unspecified: Secondary | ICD-10-CM | POA: Diagnosis not present

## 2019-05-25 DIAGNOSIS — C189 Malignant neoplasm of colon, unspecified: Secondary | ICD-10-CM | POA: Diagnosis not present

## 2019-05-25 DIAGNOSIS — C760 Malignant neoplasm of head, face and neck: Secondary | ICD-10-CM | POA: Diagnosis not present

## 2019-05-25 DIAGNOSIS — R5383 Other fatigue: Secondary | ICD-10-CM | POA: Diagnosis not present

## 2019-05-25 DIAGNOSIS — G893 Neoplasm related pain (acute) (chronic): Secondary | ICD-10-CM | POA: Diagnosis not present

## 2019-05-25 DIAGNOSIS — K922 Gastrointestinal hemorrhage, unspecified: Secondary | ICD-10-CM | POA: Diagnosis not present

## 2019-05-25 DIAGNOSIS — R5381 Other malaise: Secondary | ICD-10-CM | POA: Diagnosis not present

## 2019-05-25 DIAGNOSIS — Z23 Encounter for immunization: Secondary | ICD-10-CM | POA: Diagnosis not present

## 2019-05-27 DIAGNOSIS — R42 Dizziness and giddiness: Secondary | ICD-10-CM | POA: Diagnosis not present

## 2019-05-27 DIAGNOSIS — C148 Malignant neoplasm of overlapping sites of lip, oral cavity and pharynx: Secondary | ICD-10-CM | POA: Diagnosis not present

## 2019-05-31 ENCOUNTER — Other Ambulatory Visit: Payer: Self-pay | Admitting: Hospice and Palliative Medicine

## 2019-05-31 MED ORDER — FENTANYL 12 MCG/HR TD PT72: 1.0000 | MEDICATED_PATCH | TRANSDERMAL | 0 refills | Status: DC

## 2019-05-31 NOTE — Progress Notes (Signed)
Spoke with patient's husband by phone.  Previous plan was to wean patient off the fentanyl but husband would like to continue it for another 2 weeks.  He says he is not giving her the morphine at all.  He reports that she is having periods where she says "help me."  I explored the possibility of those could be related to pain.  He generally is giving Tylenol and says that it seems to help and allow the patient to rest.  I suggested that he might consider scheduling the Tylenol 4 times daily.  He may also consider using morphine as needed.

## 2019-06-01 DIAGNOSIS — I6529 Occlusion and stenosis of unspecified carotid artery: Secondary | ICD-10-CM | POA: Diagnosis not present

## 2019-06-01 DIAGNOSIS — C4442 Squamous cell carcinoma of skin of scalp and neck: Secondary | ICD-10-CM | POA: Diagnosis not present

## 2019-06-01 DIAGNOSIS — I129 Hypertensive chronic kidney disease with stage 1 through stage 4 chronic kidney disease, or unspecified chronic kidney disease: Secondary | ICD-10-CM | POA: Diagnosis not present

## 2019-06-01 DIAGNOSIS — K259 Gastric ulcer, unspecified as acute or chronic, without hemorrhage or perforation: Secondary | ICD-10-CM | POA: Diagnosis not present

## 2019-06-01 DIAGNOSIS — Z87891 Personal history of nicotine dependence: Secondary | ICD-10-CM | POA: Diagnosis not present

## 2019-06-01 DIAGNOSIS — Z9181 History of falling: Secondary | ICD-10-CM | POA: Diagnosis not present

## 2019-06-01 DIAGNOSIS — E7800 Pure hypercholesterolemia, unspecified: Secondary | ICD-10-CM | POA: Diagnosis not present

## 2019-06-01 DIAGNOSIS — Z931 Gastrostomy status: Secondary | ICD-10-CM | POA: Diagnosis not present

## 2019-06-01 DIAGNOSIS — E46 Unspecified protein-calorie malnutrition: Secondary | ICD-10-CM | POA: Diagnosis not present

## 2019-06-01 DIAGNOSIS — Z85038 Personal history of other malignant neoplasm of large intestine: Secondary | ICD-10-CM | POA: Diagnosis not present

## 2019-06-01 DIAGNOSIS — Z79891 Long term (current) use of opiate analgesic: Secondary | ICD-10-CM | POA: Diagnosis not present

## 2019-06-01 DIAGNOSIS — M810 Age-related osteoporosis without current pathological fracture: Secondary | ICD-10-CM | POA: Diagnosis not present

## 2019-06-01 DIAGNOSIS — N183 Chronic kidney disease, stage 3 unspecified: Secondary | ICD-10-CM | POA: Diagnosis not present

## 2019-06-01 DIAGNOSIS — Z8701 Personal history of pneumonia (recurrent): Secondary | ICD-10-CM | POA: Diagnosis not present

## 2019-06-02 DIAGNOSIS — K259 Gastric ulcer, unspecified as acute or chronic, without hemorrhage or perforation: Secondary | ICD-10-CM | POA: Diagnosis not present

## 2019-06-02 DIAGNOSIS — E7800 Pure hypercholesterolemia, unspecified: Secondary | ICD-10-CM | POA: Diagnosis not present

## 2019-06-02 DIAGNOSIS — I129 Hypertensive chronic kidney disease with stage 1 through stage 4 chronic kidney disease, or unspecified chronic kidney disease: Secondary | ICD-10-CM | POA: Diagnosis not present

## 2019-06-02 DIAGNOSIS — C4442 Squamous cell carcinoma of skin of scalp and neck: Secondary | ICD-10-CM | POA: Diagnosis not present

## 2019-06-02 DIAGNOSIS — M810 Age-related osteoporosis without current pathological fracture: Secondary | ICD-10-CM | POA: Diagnosis not present

## 2019-06-02 DIAGNOSIS — I6529 Occlusion and stenosis of unspecified carotid artery: Secondary | ICD-10-CM | POA: Diagnosis not present

## 2019-06-02 DIAGNOSIS — N183 Chronic kidney disease, stage 3 unspecified: Secondary | ICD-10-CM | POA: Diagnosis not present

## 2019-06-02 DIAGNOSIS — E46 Unspecified protein-calorie malnutrition: Secondary | ICD-10-CM | POA: Diagnosis not present

## 2019-06-04 NOTE — Progress Notes (Signed)
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 ° °INTERVAL HISTORY: Patient returns to clinic today for repeat laboratory work, further evaluation, and discussion of her imaging results.  She continues to have chronic weakness and fatigue, but remains more alert with an improved performance status.  Her husband does not report any new complaints.  She continues to require 100% of nutrition via her PEG tube. She has no neurologic complaints.  She denies any recent fevers or illnesses.  She denies any chest pain, shortness of breath, cough, or hemoptysis.  She has no nausea, vomiting, constipation, or diarrhea.  She has no urinary complaints.  Patient offers no further specific complaints today. ° °REVIEW OF SYSTEMS:   °Review of Systems  °Constitutional: Positive for malaise/fatigue. Negative for fever and weight loss.  °HENT: Negative.  Negative for sore throat.   °Respiratory: Negative.  Negative for cough, shortness of breath and stridor.   °Cardiovascular: Negative.  Negative for chest pain and leg swelling.  °Gastrointestinal: Negative.  Negative for abdominal pain, constipation, diarrhea, nausea and vomiting.  °Genitourinary: Negative.  Negative for dysuria.  °Musculoskeletal: Negative.  Negative for falls and joint pain.  °Skin: Negative.  Negative for itching and rash.  °Neurological: Positive for weakness. Negative for sensory change, focal weakness and headaches.  °Psychiatric/Behavioral: Positive for memory loss. Negative for depression and hallucinations. The patient is not nervous/anxious and does not have insomnia.   ° ° °As per HPI.  Otherwise, a complete review of systems is negative. ° °PAST MEDICAL HISTORY: °Past Medical History:  °Diagnosis Date  °• Adenocarcinoma of colon (HCC)   °• Benign neoplasm of ascending colon   °• Essential hypertension 06/13/2014  °• Goals of care, counseling/discussion 07/14/2017  °• HLD (hyperlipidemia) 06/13/2014  °• Hypercholesteremia   °• Hyperlipidemia   °• Multiple thyroid nodules 01/22/2012  °• Osteoporosis   °• Polycythemia, secondary 12/26/2014  °• Pure hypercholesterolemia 03/15/2015  °• Squamous cell carcinoma of head and neck (HCC)   °• Thyroid nodule   ° ° °PAST SURGICAL HISTORY: °Past Surgical History:  °Procedure Laterality Date  °• ABDOMINAL HYSTERECTOMY    °• COLONOSCOPY WITH PROPOFOL N/A 07/02/2017  ° Procedure: COLONOSCOPY WITH PROPOFOL;  Surgeon: Wohl, Darren, MD;  Location: [facility];  Service: Endoscopy;  Laterality: N/A;  °• ESOPHAGOGASTRODUODENOSCOPY N/A 05/17/2019  ° Procedure: ESOPHAGOGASTRODUODENOSCOPY (EGD);  Surgeon: Vanga, Rohini Reddy, MD;  Location: [facility];  Service: Gastroenterology;  Laterality: N/A;  °• ORIF ANKLE FRACTURE Left 08/08/2017  ° Procedure: OPEN REDUCTION INTERNAL FIXATION (ORIF) ANKLE FRACTURE;  Surgeon: Hooten, James P, MD;  Location: [facility];  Service: Orthopedics;  Laterality: Left;  °• POLYPECTOMY N/A 07/02/2017  ° Procedure: POLYPECTOMY;  Surgeon: Wohl, Darren, MD;  Location: [facility];  Service: Endoscopy;  Laterality: N/A;  °• WRIST FRACTURE SURGERY  08/2009  ° badly broken  ° ° °FAMILY HISTORY °Family History  °Problem Relation Age of Onset  °• Heart disease Mother   °• Diabetes Father   ° ° °  ° ADVANCED DIRECTIVES:  ° ° °HEALTH MAINTENANCE: °Social History  ° °Tobacco Use  °• Smoking status: Former Smoker  °  Packs/day: 0.25  °  Types: Cigarettes  °    Quit date: 06/17/2017  °  Years since quitting: 1.9  °• Smokeless tobacco: Never Used  °Substance Use Topics  °• Alcohol use: No  °• Drug use: No  ° ° ° °Allergies  °Allergen Reactions    °• Potassium Nausea Only and Nausea And Vomiting  °• Other Other (See Comments) and Rash  °  TDAP °tdap °TDAP  °• Tetanus Antitoxin Rash  ° ° °Current Outpatient Medications  °Medication Sig Dispense Refill  °• cholestyramine (QUESTRAN) 4 GM/DOSE powder Take 1 packet (4 g total) by mouth 3 (three) times daily with meals. 378 g 12  °• clotrimazole-betamethasone (LOTRISONE) cream APPLY TO AFFECTED AREA TWICE A DAY    °• Ensure Plus (ENSURE PLUS) LIQD Give 1 carton of ensure plus 5 times daily via feeding tube.  Flush with 60ml of water before and after feeding. 1185 mL 0  °• lansoprazole (PREVACID SOLUTAB) 30 MG disintegrating tablet Place 1 tablet (30 mg total) into feeding tube 2 (two) times daily. 60 tablet 0  °• morphine (ROXANOL) 20 MG/ML concentrated solution Place 0.25 mLs (5 mg total) into feeding tube every 4 (four) hours as needed for moderate pain or severe pain. 30 mL 0  °• NARCAN 4 MG/0.1ML LIQD nasal spray kit USE 1 SPRAY PER INTRANASAL ROUTE AS DIRECTED  0  °• Polyethylene Glycol 3350 (PEG 3350) 17 GM/SCOOP POWD Take 17 g by mouth 1 day or 1 dose.    °• pravastatin (PRAVACHOL) 10 MG tablet Take 10 mg by mouth daily.    °• prochlorperazine (COMPAZINE) 10 MG tablet Take 1 tablet (10 mg total) by mouth every 6 (six) hours as needed for nausea or vomiting. 60 tablet 2  ° °No current facility-administered medications for this visit.   ° °Facility-Administered Medications Ordered in Other Visits  °Medication Dose Route Frequency Provider Last Rate Last Dose  °• heparin lock flush 100 unit/mL  250 Units Intracatheter PRN Pandit, Sandeep, MD      °• heparin lock flush 100 unit/mL  500 Units Intracatheter PRN Pandit, Sandeep, MD      °• potassium chloride 20 mEq in sodium chloride 0.9 % 500 mL injection   Intravenous Once Saunders, Doni H, RN      °• sodium chloride 0.9 % injection 10 mL  10 mL Intracatheter PRN Pandit, Sandeep, MD      °• sodium chloride 0.9 % injection 10 mL  10 mL Intracatheter PRN Pandit,  Sandeep, MD      °• sodium chloride 0.9 % injection 10 mL  10 mL Intracatheter PRN Pandit, Sandeep, MD      °• sodium chloride 0.9 % injection 10 mL  10 mL Intracatheter PRN Pandit, Sandeep, MD      °-year-old ° °OBJECTIVE: °There were no vitals filed for this visit.   There is no height or weight on file to calculate BMI.    ECOG FS:2 - Symptomatic, <50% confined to bed ° °General: Well-developed, well-nourished, no acute distress.  Sitting in a wheelchair. °Eyes: Pink conjunctiva, anicteric sclera. °HEENT: Normocephalic, moist mucous membranes, clear oropharnyx.  No palpable masses or lymphadenopathy. °Lungs: Clear to auscultation bilaterally. °Heart: Regular rate and rhythm. No rubs, murmurs, or gallops. °Abdomen: Soft, nontender, nondistended. No organomegaly noted, normoactive bowel sounds.  PEG tube noted. °Musculoskeletal: No edema, cyanosis, or clubbing. °Neuro: Alert, answering all questions appropriately. Cranial nerves grossly intact. °Skin: No rashes or petechiae noted. °Psych: Normal affect. ° ° °LAB RESULTS: ° °Lab Results  °Component Value Date  °   NA 141 05/17/2019   K 3.8 05/17/2019   CL 114 (H) 05/17/2019   CO2 21 (L) 05/17/2019   GLUCOSE 91 05/17/2019   BUN 29 (H) 05/17/2019   CREATININE 0.86 06/07/2019   CALCIUM 8.0 (L) 05/17/2019   PROT 6.7 05/16/2019   ALBUMIN 3.7 05/16/2019   AST 32 05/16/2019   ALT 25 05/16/2019   ALKPHOS 91 05/16/2019   BILITOT 0.5 05/16/2019   GFRNONAA >60 06/07/2019   GFRAA >60 06/07/2019    Lab Results  Component Value Date   WBC 7.1 06/07/2019   NEUTROABS 5.0 06/07/2019   HGB 14.5 06/07/2019   HCT 44.5 06/07/2019   MCV 94.1 06/07/2019   PLT 265 06/07/2019     STUDIES: Ct Soft Tissue Neck W Contrast  Result Date: 06/07/2019 CLINICAL DATA:  Squamous cell carcinoma head neck follow-up. EXAM: CT NECK WITH CONTRAST TECHNIQUE: Multidetector CT imaging of the neck was performed using the standard protocol following the bolus administration of  intravenous contrast. CONTRAST:  2m OMNIPAQUE IOHEXOL 300 MG/ML  SOLN COMPARISON:  CT neck 03/07/2019 FINDINGS: Pharynx and larynx: No pharyngeal mass. Mild mucosal edema throughout the pharynx likely due to radiation therapy. Epiglottis and larynx intact. Salivary glands: Right submandibular gland resected. Atrophic salivary glands due to radiation. No mass or stone. Thyroid: Mildly heterogeneous thyroid without mass lesion or goiter Lymph nodes: No enlarged lymph nodes. Vascular: Severe atherosclerotic disease. Extensive calcification in the aortic arch and carotid and vertebral arteries bilaterally. Limited intracranial: Negative Visualized orbits: Negative Mastoids and visualized paranasal sinuses: Mild mucosal edema paranasal sinuses without air-fluid level. Mastoid clear bilaterally. Skeleton: Cervical kyphosis and spondylosis Surgical resection of right mandible.  No aggressive bony process. Upper chest: Chest CT reported separately Other: None IMPRESSION: Stable CT neck.  No recurrent tumor or adenopathy in the neck. Electronically Signed   By: CFranchot GalloM.D.   On: 06/07/2019 14:33   Ct Chest W Contrast  Result Date: 06/07/2019 CLINICAL DATA:  Squamous cell carcinoma of the head and neck EXAM: CT CHEST, ABDOMEN, AND PELVIS WITH CONTRAST TECHNIQUE: Multidetector CT imaging of the chest, abdomen and pelvis was performed following the standard protocol during bolus administration of intravenous contrast. CONTRAST:  714mOMNIPAQUE IOHEXOL 300 MG/ML  SOLN COMPARISON:  03/07/2019 FINDINGS: CT CHEST FINDINGS Cardiovascular: The heart size appears normal. Small pericardial effusion. Aortic atherosclerosis. Left main, lad, left circumflex and RCA coronary artery calcifications. Mediastinum/Nodes: No enlarged mediastinal, hilar, or axillary lymph nodes. Thyroid gland, trachea, and esophagus demonstrate no significant findings. Lungs/Pleura: Advanced changes of emphysema. No pleural effusion, airspace  consolidation, or atelectasis identified. -within the right upper lobe there is a sub solid, irregular configured spiculated density which measures 1.7 cm, image 107/7. Stable from previous exam. -stable bandlike area of presumed scarring within the posterolateral right lower lobe, image 92/5. -Right upper lobe nodule measuring 4 mm with adjacent calcification is unchanged, image 61/5. -New patchy area of tree-in-bud nodularity within the right middle lobe, is likely postinflammatory/infectious, image 107/5. Musculoskeletal: Interval left third and fourth anterior rib fractures. No suspicious bone lesions. CT ABDOMEN PELVIS FINDINGS Hepatobiliary: No focal liver abnormality is seen. No gallstones, gallbladder wall thickening, or biliary dilatation. Pancreas: Unremarkable. No pancreatic ductal dilatation or surrounding inflammatory changes. Spleen: Normal in size without focal abnormality. Adrenals/Urinary Tract: Adrenal glands are unremarkable. Kidneys are normal, without renal calculi, focal lesion, or hydronephrosis. Bladder is unremarkable. Stomach/Bowel: Percutaneous gastrostomy tube is identified in the left upper quadrant of the abdomen. No bowel wall thickening,  inflammation or bowel distension identified. Vascular/Lymphatic: Aortic atherosclerosis. No aneurysm. No abdominopelvic adenopathy identified. Reproductive: Status post hysterectomy. No adnexal masses. Other: None Musculoskeletal: No acute or significant osseous findings. IMPRESSION: 1. The irregular configured nodular area of architectural distortion is unchanged when compared with the previous exam. This lesion previously exhibit low level FDG uptake with maximum SUV of 2.0. 2. No new or progressive nodules identified within the lungs. 3. Left main and 3 vessel coronary artery calcifications noted. 4. Interval left third and fourth anterior rib fractures. Aortic Atherosclerosis (ICD10-I70.0) and Emphysema (ICD10-J43.9). Electronically Signed   By:  Taylor  Stroud M.D.   On: 06/07/2019 12:52  ° °Ct Abdomen Pelvis W Contrast ° °Result Date: 06/07/2019 °CLINICAL DATA:  Squamous cell carcinoma of the head and neck EXAM: CT CHEST, ABDOMEN, AND PELVIS WITH CONTRAST TECHNIQUE: Multidetector CT imaging of the chest, abdomen and pelvis was performed following the standard protocol during bolus administration of intravenous contrast. CONTRAST:  75mL OMNIPAQUE IOHEXOL 300 MG/ML  SOLN COMPARISON:  03/07/2019 FINDINGS: CT CHEST FINDINGS Cardiovascular: The heart size appears normal. Small pericardial effusion. Aortic atherosclerosis. Left main, lad, left circumflex and RCA coronary artery calcifications. Mediastinum/Nodes: No enlarged mediastinal, hilar, or axillary lymph nodes. Thyroid gland, trachea, and esophagus demonstrate no significant findings. Lungs/Pleura: Advanced changes of emphysema. No pleural effusion, airspace consolidation, or atelectasis identified. -within the right upper lobe there is a sub solid, irregular configured spiculated density which measures 1.7 cm, image 107/7. Stable from previous exam. -stable bandlike area of presumed scarring within the posterolateral right lower lobe, image 92/5. -Right upper lobe nodule measuring 4 mm with adjacent calcification is unchanged, image 61/5. -New patchy area of tree-in-bud nodularity within the right middle lobe, is likely postinflammatory/infectious, image 107/5. Musculoskeletal: Interval left third and fourth anterior rib fractures. No suspicious bone lesions. CT ABDOMEN PELVIS FINDINGS Hepatobiliary: No focal liver abnormality is seen. No gallstones, gallbladder wall thickening, or biliary dilatation. Pancreas: Unremarkable. No pancreatic ductal dilatation or surrounding inflammatory changes. Spleen: Normal in size without focal abnormality. Adrenals/Urinary Tract: Adrenal glands are unremarkable. Kidneys are normal, without renal calculi, focal lesion, or hydronephrosis. Bladder is unremarkable.  Stomach/Bowel: Percutaneous gastrostomy tube is identified in the left upper quadrant of the abdomen. No bowel wall thickening, inflammation or bowel distension identified. Vascular/Lymphatic: Aortic atherosclerosis. No aneurysm. No abdominopelvic adenopathy identified. Reproductive: Status post hysterectomy. No adnexal masses. Other: None Musculoskeletal: No acute or significant osseous findings. IMPRESSION: 1. The irregular configured nodular area of architectural distortion is unchanged when compared with the previous exam. This lesion previously exhibit low level FDG uptake with maximum SUV of 2.0. 2. No new or progressive nodules identified within the lungs. 3. Left main and 3 vessel coronary artery calcifications noted. 4. Interval left third and fourth anterior rib fractures. Aortic Atherosclerosis (ICD10-I70.0) and Emphysema (ICD10-J43.9). Electronically Signed   By: Taylor  Stroud M.D.   On: 06/07/2019 12:52  ° °Us Carotid Bilateral ° °Result Date: 05/17/2019 °CLINICAL DATA:  Carotid artery stenosis. EXAM: BILATERAL CAROTID DUPLEX ULTRASOUND TECHNIQUE: Gray scale imaging, color Doppler and duplex ultrasound were performed of bilateral carotid and vertebral arteries in the neck. COMPARISON:  CT of the neck dated March 07, 2019. FINDINGS: Criteria: Quantification of carotid stenosis is based on velocity parameters that correlate the residual internal carotid diameter with NASCET-based stenosis levels, using the diameter of the distal internal carotid lumen as the denominator for stenosis measurement. The following velocity measurements were obtained: RIGHT ICA: 70 cm/sec CCA: 76 cm/sec SYSTOLIC ICA/CCA RATIO:    0.9 ECA: 79 cm/sec LEFT ICA: 115 cm/sec CCA: 53 cm/sec SYSTOLIC ICA/CCA RATIO:  2 ECA: 87 cm/sec RIGHT CAROTID ARTERY: There are atherosclerotic changes throughout the right common carotid artery resulting in less than 50% stenosis by grayscale imaging in the proximal portion of the artery. In the distal  portion of the artery there may be greater than 50% narrowing by grayscale imaging. There is less than 50% stenosis by grayscale imaging involving the right ICA. RIGHT VERTEBRAL ARTERY:  Antegrade flow is noted. LEFT CAROTID ARTERY: Atherosclerotic plaque is noted in the left common carotid artery with less than 50% stenosis by grayscale imaging. Atherosclerotic plaque is noted in the carotid bulb. There is moderate atherosclerotic plaque involving the left ICA resulting in greater than 50% stenosis by grayscale imaging. LEFT VERTEBRAL ARTERY:  Antegrade flow is noted. IMPRESSION: 1. Atherosclerotic disease of the right ICA resulting in left than 50% stenosis by both color Doppler and gray scale imaging. 2. Atherosclerotic disease involving the left ICA likely resulting in less than 50% stenosis of the left ICA despite a borderline ICA/CCA ratio. Electronically Signed   By: Constance Holster M.D.   On: 05/17/2019 12:23    ASSESSMENT: Recurrent, progressive stage IVa head and neck squamous cell carcinoma, adenocarcinoma of the colon.  PLAN:    1.  Recurrent, progressive stage IVa head and neck squamous cell carcinoma: Patient's last infusion of nivolumab was on November 11, 2018.  CT scan results from June 07, 2019 reviewed independently and reported as above with no obvious evidence of recurrent or progressive disease.  Her performance status remains decreased, but significantly improved from previous.  No intervention is needed at this time.  Appreciate ENT input.  Continue follow-up with them as scheduled.  Return to clinic in 3 months with repeat laboratory can further evaluation.  Will repeat imaging in 6 months. 2.  Diarrhea: Resolved.  Continue Imodium and Lomotil as needed.   3.  Decreased performance status: Likely multifactorial.  Improved.  Appreciate palliative care input.   4.  Nausea/vomiting: Patient does not complain of this today.  Continue Phenergan as needed. 5.  Pain: Patient does not  complain of this today.  Appears well controlled with current narcotic regimen.  Plan is to start tapering narcotics as tolerated. 6.  PEG tube: Continue tube feeds as per dietary.  Appreciate surgery and dietary input.  Patient and her husband expressed understanding that they can call or return to clinic at any time if they have any questions, concerns, or complaints.  [name], MD 06/09/19 3:28 PM

## 2019-06-07 ENCOUNTER — Other Ambulatory Visit: Payer: Self-pay

## 2019-06-07 ENCOUNTER — Ambulatory Visit
Admission: RE | Admit: 2019-06-07 | Discharge: 2019-06-07 | Disposition: A | Discharge status: Home / Self Care | Payer: PPO | Source: Ambulatory Visit | Attending: Oncology | Admitting: Oncology

## 2019-06-07 ENCOUNTER — Inpatient Hospital Stay: Payer: PPO | Attending: Oncology

## 2019-06-07 DIAGNOSIS — R911 Solitary pulmonary nodule: Secondary | ICD-10-CM | POA: Insufficient documentation

## 2019-06-07 DIAGNOSIS — Z79899 Other long term (current) drug therapy: Secondary | ICD-10-CM | POA: Insufficient documentation

## 2019-06-07 DIAGNOSIS — C189 Malignant neoplasm of colon, unspecified: Secondary | ICD-10-CM | POA: Diagnosis not present

## 2019-06-07 DIAGNOSIS — C760 Malignant neoplasm of head, face and neck: Secondary | ICD-10-CM

## 2019-06-07 DIAGNOSIS — I10 Essential (primary) hypertension: Secondary | ICD-10-CM | POA: Insufficient documentation

## 2019-06-07 DIAGNOSIS — J439 Emphysema, unspecified: Secondary | ICD-10-CM | POA: Diagnosis not present

## 2019-06-07 DIAGNOSIS — R531 Weakness: Secondary | ICD-10-CM | POA: Insufficient documentation

## 2019-06-07 DIAGNOSIS — E7800 Pure hypercholesterolemia, unspecified: Secondary | ICD-10-CM | POA: Insufficient documentation

## 2019-06-07 DIAGNOSIS — I700 Atherosclerosis of aorta: Secondary | ICD-10-CM | POA: Diagnosis not present

## 2019-06-07 DIAGNOSIS — C4442 Squamous cell carcinoma of skin of scalp and neck: Secondary | ICD-10-CM | POA: Diagnosis not present

## 2019-06-07 DIAGNOSIS — I2510 Atherosclerotic heart disease of native coronary artery without angina pectoris: Secondary | ICD-10-CM | POA: Diagnosis not present

## 2019-06-07 LAB — CBC WITH DIFFERENTIAL/PLATELET
Abs Immature Granulocytes: 0.02 10*3/uL (ref 0.00–0.07)
Basophils Absolute: 0 10*3/uL (ref 0.0–0.1)
Basophils Relative: 0 %
Eosinophils Absolute: 0.1 10*3/uL (ref 0.0–0.5)
Eosinophils Relative: 2 %
HCT: 44.5 % (ref 36.0–46.0)
Hemoglobin: 14.5 g/dL (ref 12.0–15.0)
Immature Granulocytes: 0 %
Lymphocytes Relative: 22 %
Lymphs Abs: 1.5 10*3/uL (ref 0.7–4.0)
MCH: 30.7 pg (ref 26.0–34.0)
MCHC: 32.6 g/dL (ref 30.0–36.0)
MCV: 94.1 fL (ref 80.0–100.0)
Monocytes Absolute: 0.4 10*3/uL (ref 0.1–1.0)
Monocytes Relative: 6 %
Neutro Abs: 5 10*3/uL (ref 1.7–7.7)
Neutrophils Relative %: 70 %
Platelets: 265 10*3/uL (ref 150–400)
RBC: 4.73 MIL/uL (ref 3.87–5.11)
RDW: 13.1 % (ref 11.5–15.5)
WBC: 7.1 10*3/uL (ref 4.0–10.5)
nRBC: 0 % (ref 0.0–0.2)

## 2019-06-07 LAB — CREATININE, SERUM
Creatinine, Ser: 0.86 mg/dL (ref 0.44–1.00)
GFR calc Af Amer: >60 mL/min (ref >60)
GFR calc non Af Amer: >60 mL/min (ref >60)

## 2019-06-07 LAB — MAGNESIUM: Magnesium: 2.4 mg/dL (ref 1.7–2.4)

## 2019-06-07 MED ORDER — IOHEXOL 300 MG/ML  SOLN
100.0000 mL | Freq: Once | INTRAMUSCULAR | Status: AC | PRN
  Administered 2019-06-07: 75 mL via INTRAVENOUS

## 2019-06-08 ENCOUNTER — Other Ambulatory Visit: Payer: Self-pay

## 2019-06-08 LAB — THYROID PANEL WITH TSH
Free Thyroxine Index: 2.1 (ref 1.2–4.9)
T3 Uptake Ratio: 25 % (ref 24–39)
T4, Total: 8.3 ug/dL (ref 4.5–12.0)
TSH: 1.19 u[IU]/mL (ref 0.450–4.500)

## 2019-06-08 NOTE — Progress Notes (Signed)
Patient pre screened for office appointment patient unable to speak on the phone husband supplied information.

## 2019-06-09 ENCOUNTER — Inpatient Hospital Stay: Payer: PPO

## 2019-06-09 ENCOUNTER — Inpatient Hospital Stay (HOSPITAL_BASED_OUTPATIENT_CLINIC_OR_DEPARTMENT_OTHER): Payer: PPO | Admitting: Oncology

## 2019-06-09 ENCOUNTER — Inpatient Hospital Stay (HOSPITAL_BASED_OUTPATIENT_CLINIC_OR_DEPARTMENT_OTHER): Payer: PPO | Admitting: Hospice and Palliative Medicine

## 2019-06-09 ENCOUNTER — Other Ambulatory Visit: Payer: Self-pay

## 2019-06-09 DIAGNOSIS — Z515 Encounter for palliative care: Secondary | ICD-10-CM

## 2019-06-09 DIAGNOSIS — C760 Malignant neoplasm of head, face and neck: Secondary | ICD-10-CM

## 2019-06-09 DIAGNOSIS — C189 Malignant neoplasm of colon, unspecified: Secondary | ICD-10-CM | POA: Diagnosis not present

## 2019-06-09 DIAGNOSIS — G893 Neoplasm related pain (acute) (chronic): Secondary | ICD-10-CM | POA: Diagnosis not present

## 2019-06-09 NOTE — Addendum Note (Signed)
Addended by: Altha Harm R on: 06/09/2019 10:21 AM   Modules accepted: Orders

## 2019-06-09 NOTE — Progress Notes (Signed)
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 PMH also includes adenocarcinoma of the colon not currently on treatment, malnutrition status post PEG insertion, she had bilateral ankle fractures causing a marked reduction in her performance status, chronic pain on transdermal fentanyl, hypertension, hyperlipidemia, and CKD stage III. Patient had disease progression and general decline despite on previous treatment. Hospice had been discussed but patient/husband opted to continue treatment with nivolumab. Patient had good response to nivolumab.  Palliative care is now asked to follow and provide her with support.   SOCIAL HISTORY:     reports that she quit smoking about 1 years ago. Her smoking use included cigarettes. She smoked 0.25 packs per day. She has never used smokeless tobacco. She reports that she does not drink alcohol or use drugs.   Patient is married. She lives at home with her husband. She has no children. 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  ADVANCE DIRECTIVES:  Patient does not currently have advance directives. She would want her husband to be her decision-maker. Patient and husband have talked about completing a living will but have not currently done so.   CODE STATUS: DNR  PAST MEDICAL HISTORY: Past Medical History:  Diagnosis Date    Adenocarcinoma of colon ([place])    Benign neoplasm of ascending colon    Essential hypertension 06/13/2014   Goals of care, counseling/discussion 07/14/2017   HLD (hyperlipidemia) 06/13/2014   Hypercholesteremia    Hyperlipidemia    Multiple thyroid nodules 01/22/2012   Osteoporosis    Polycythemia, secondary 12/26/2014   Pure hypercholesterolemia 03/15/2015   Squamous cell carcinoma of head and neck (HCC)    Thyroid nodule     PAST SURGICAL HISTORY:  Past Surgical History:  Procedure Laterality Date   ABDOMINAL HYSTERECTOMY     COLONOSCOPY WITH PROPOFOL N/A 07/02/2017   Procedure: COLONOSCOPY WITH PROPOFOL;  Surgeon: Lucilla Lame, MD;  Location: [facility];  Service: Endoscopy;  Laterality: N/A;   ESOPHAGOGASTRODUODENOSCOPY N/A 05/17/2019   Procedure: ESOPHAGOGASTRODUODENOSCOPY (EGD);  Surgeon: Lin Landsman, MD;  Location: [facility] ENDOSCOPY;  Service: Gastroenterology;  Laterality: N/A;   ORIF ANKLE FRACTURE Left 08/08/2017   Procedure: OPEN REDUCTION INTERNAL FIXATION (ORIF) ANKLE FRACTURE;  Surgeon: Dereck Leep, MD;  Location: [facility];  Service: Orthopedics;  Laterality: Left;   POLYPECTOMY N/A 07/02/2017   Procedure: POLYPECTOMY;  Surgeon: Lucilla Lame, MD;  Location: [facility];  Service: Endoscopy;  Laterality: N/A;   WRIST FRACTURE SURGERY  08/2009   badly broken    HEMATOLOGY/ONCOLOGY HISTORY:  Oncology History  Squamous cell carcinoma of head and neck ([place])  07/01/2017 Initial Diagnosis   Squamous cell carcinoma of head and neck ([place])   04/08/2018 -  Chemotherapy   The patient had nivolumab (OPDIVO) 240 mg in sodium chloride 0.9 % 100 mL chemo infusion, 240 mg, Intravenous, Once, 16 of 20 cycles Administration: 240 mg (04/08/2018), 240 mg (04/22/2018), 240 mg (05/06/2018), 240 mg (05/20/2018), 240 mg (06/03/2018),  240 mg (06/24/2018), 240 mg (07/08/2018), 240 mg (07/22/2018), 240 mg (08/05/2018), 240 mg (08/19/2018), 240 mg (09/02/2018), 240  mg (09/16/2018), 240 mg (09/30/2018), 240 mg (10/14/2018), 240 mg (10/28/2018), 240 mg (11/11/2018)  for chemotherapy treatment.      ALLERGIES:  is allergic to potassium; other; and tetanus antitoxin.  MEDICATIONS:  Current Outpatient Medications  Medication Sig Dispense Refill   cholestyramine (QUESTRAN) 4 GM/DOSE powder Take 1 packet (4 g total) by mouth 3 (three) times daily with meals. 378 g 12   clotrimazole-betamethasone (LOTRISONE) cream APPLY TO AFFECTED AREA TWICE A DAY     Ensure Plus (ENSURE PLUS) LIQD Give 1 carton of ensure plus 5 times daily via feeding tube.  Flush with 73m of water before and after feeding. 1185 mL 0   fentaNYL (DURAGESIC) 12 MCG/HR Place 1 patch onto the skin every 3 (three) days. 5 patch 0   lansoprazole (PREVACID SOLUTAB) 30 MG disintegrating tablet Place 1 tablet (30 mg total) into feeding tube 2 (two) times daily. 60 tablet 0   morphine (ROXANOL) 20 MG/ML concentrated solution Place 0.25 mLs (5 mg total) into feeding tube every 4 (four) hours as needed for moderate pain or severe pain. 30 mL 0   NARCAN 4 MG/0.1ML LIQD nasal spray kit USE 1 SPRAY PER INTRANASAL ROUTE AS DIRECTED  0   Polyethylene Glycol 3350 (PEG 3350) 17 GM/SCOOP POWD Take 17 g by mouth 1 day or 1 dose.     pravastatin (PRAVACHOL) 10 MG tablet Take 10 mg by mouth daily.     prochlorperazine (COMPAZINE) 10 MG tablet Take 1 tablet (10 mg total) by mouth every 6 (six) hours as needed for nausea or vomiting. 60 tablet 2   No current facility-administered medications for this visit.    Facility-Administered Medications Ordered in Other Visits  Medication Dose Route Frequency Provider Last Rate Last Dose   heparin lock flush 100 unit/mL  250 Units Intracatheter PRN PLeia Alf MD       heparin lock flush 100 unit/mL  500 Units Intracatheter PRN PLeia Alf MD       potassium chloride 20 mEq in sodium chloride 0.9 % 500 mL injection   Intravenous Once STye Savoy Doni H, RN        sodium chloride 0.9 % injection 10 mL  10 mL Intracatheter PRN PLeia Alf MD       sodium chloride 0.9 % injection 10 mL  10 mL Intracatheter PRN PLeia Alf MD       sodium chloride 0.9 % injection 10 mL  10 mL Intracatheter PRN PLeia Alf MD       sodium chloride 0.9 % injection 10 mL  10 mL Intracatheter PRN PLeia Alf MD        VITAL SIGNS: There were no vitals taken for this visit. There were no vitals filed for this visit.  Estimated body mass index is 17.49 kg/m as calculated from the following:   Height as of 05/16/19: 5' 6.5" (1.689 m).   Weight as of 05/16/19: 110 lb (49.9 kg).  LABS: CBC:    Component Value Date/Time   WBC 7.1 06/07/2019 1018   HGB 14.5 06/07/2019 1018   HCT 44.5 06/07/2019 1018   PLT 265 06/07/2019 1018   MCV 94.1 06/07/2019 1018   NEUTROABS 5.0 06/07/2019 1018   LYMPHSABS 1.5 06/07/2019 1018   MONOABS 0.4 06/07/2019 1018   EOSABS 0.1 06/07/2019 1018   BASOSABS 0.0 06/07/2019 1018   Comprehensive Metabolic Panel:  Component Value Date/Time   NA 141 05/17/2019 0527   K 3.8 05/17/2019 0527   CL 114 (H) 05/17/2019 0527   CO2 21 (L) 05/17/2019 0527   BUN 29 (H) 05/17/2019 0527   CREATININE 0.86 06/07/2019 1018   GLUCOSE 91 05/17/2019 0527   CALCIUM 8.0 (L) 05/17/2019 0527   AST 32 05/16/2019 1257   ALT 25 05/16/2019 1257   ALKPHOS 91 05/16/2019 1257   BILITOT 0.5 05/16/2019 1257   PROT 6.7 05/16/2019 1257   ALBUMIN 3.7 05/16/2019 1257    RADIOGRAPHIC STUDIES: Ct Soft Tissue Neck W Contrast  Result Date: 06/07/2019 CLINICAL DATA:  Squamous cell carcinoma head neck follow-up. EXAM: CT NECK WITH CONTRAST TECHNIQUE: Multidetector CT imaging of the neck was performed using the standard protocol following the bolus administration of intravenous contrast. CONTRAST:  35m OMNIPAQUE IOHEXOL 300 MG/ML  SOLN COMPARISON:  CT neck 03/07/2019 FINDINGS: Pharynx and larynx: No pharyngeal mass. Mild mucosal edema throughout  the pharynx likely due to radiation therapy. Epiglottis and larynx intact. Salivary glands: Right submandibular gland resected. Atrophic salivary glands due to radiation. No mass or stone. Thyroid: Mildly heterogeneous thyroid without mass lesion or goiter Lymph nodes: No enlarged lymph nodes. Vascular: Severe atherosclerotic disease. Extensive calcification in the aortic arch and carotid and vertebral arteries bilaterally. Limited intracranial: Negative Visualized orbits: Negative Mastoids and visualized paranasal sinuses: Mild mucosal edema paranasal sinuses without air-fluid level. Mastoid clear bilaterally. Skeleton: Cervical kyphosis and spondylosis Surgical resection of right mandible.  No aggressive bony process. Upper chest: Chest CT reported separately Other: None IMPRESSION: Stable CT neck.  No recurrent tumor or adenopathy in the neck. Electronically Signed   By: CFranchot GalloM.D.   On: 06/07/2019 14:33   Ct Chest W Contrast  Result Date: 06/07/2019 CLINICAL DATA:  Squamous cell carcinoma of the head and neck EXAM: CT CHEST, ABDOMEN, AND PELVIS WITH CONTRAST TECHNIQUE: Multidetector CT imaging of the chest, abdomen and pelvis was performed following the standard protocol during bolus administration of intravenous contrast. CONTRAST:  727mOMNIPAQUE IOHEXOL 300 MG/ML  SOLN COMPARISON:  03/07/2019 FINDINGS: CT CHEST FINDINGS Cardiovascular: The heart size appears normal. Small pericardial effusion. Aortic atherosclerosis. Left main, lad, left circumflex and RCA coronary artery calcifications. Mediastinum/Nodes: No enlarged mediastinal, hilar, or axillary lymph nodes. Thyroid gland, trachea, and esophagus demonstrate no significant findings. Lungs/Pleura: Advanced changes of emphysema. No pleural effusion, airspace consolidation, or atelectasis identified. -within the right upper lobe there is a sub solid, irregular configured spiculated density which measures 1.7 cm, image 107/7. Stable from previous  exam. -stable bandlike area of presumed scarring within the posterolateral right lower lobe, image 92/5. -Right upper lobe nodule measuring 4 mm with adjacent calcification is unchanged, image 61/5. -New patchy area of tree-in-bud nodularity within the right middle lobe, is likely postinflammatory/infectious, image 107/5. Musculoskeletal: Interval left third and fourth anterior rib fractures. No suspicious bone lesions. CT ABDOMEN PELVIS FINDINGS Hepatobiliary: No focal liver abnormality is seen. No gallstones, gallbladder wall thickening, or biliary dilatation. Pancreas: Unremarkable. No pancreatic ductal dilatation or surrounding inflammatory changes. Spleen: Normal in size without focal abnormality. Adrenals/Urinary Tract: Adrenal glands are unremarkable. Kidneys are normal, without renal calculi, focal lesion, or hydronephrosis. Bladder is unremarkable. Stomach/Bowel: Percutaneous gastrostomy tube is identified in the left upper quadrant of the abdomen. No bowel wall thickening, inflammation or bowel distension identified. Vascular/Lymphatic: Aortic atherosclerosis. No aneurysm. No abdominopelvic adenopathy identified. Reproductive: Status post hysterectomy. No adnexal masses. Other: None Musculoskeletal: No acute or significant osseous findings.  IMPRESSION: 1. The irregular configured nodular area of architectural distortion is unchanged when compared with the previous exam. This lesion previously exhibit low level FDG uptake with maximum SUV of 2.0. 2. No new or progressive nodules identified within the lungs. 3. Left main and 3 vessel coronary artery calcifications noted. 4. Interval left third and fourth anterior rib fractures. Aortic Atherosclerosis (ICD10-I70.0) and Emphysema (ICD10-J43.9). Electronically Signed   By: Kerby Moors M.D.   On: 06/07/2019 12:52   Ct Abdomen Pelvis W Contrast  Result Date: 06/07/2019 CLINICAL DATA:  Squamous cell carcinoma of the head and neck EXAM: CT CHEST, ABDOMEN,  AND PELVIS WITH CONTRAST TECHNIQUE: Multidetector CT imaging of the chest, abdomen and pelvis was performed following the standard protocol during bolus administration of intravenous contrast. CONTRAST:  30m OMNIPAQUE IOHEXOL 300 MG/ML  SOLN COMPARISON:  03/07/2019 FINDINGS: CT CHEST FINDINGS Cardiovascular: The heart size appears normal. Small pericardial effusion. Aortic atherosclerosis. Left main, lad, left circumflex and RCA coronary artery calcifications. Mediastinum/Nodes: No enlarged mediastinal, hilar, or axillary lymph nodes. Thyroid gland, trachea, and esophagus demonstrate no significant findings. Lungs/Pleura: Advanced changes of emphysema. No pleural effusion, airspace consolidation, or atelectasis identified. -within the right upper lobe there is a sub solid, irregular configured spiculated density which measures 1.7 cm, image 107/7. Stable from previous exam. -stable bandlike area of presumed scarring within the posterolateral right lower lobe, image 92/5. -Right upper lobe nodule measuring 4 mm with adjacent calcification is unchanged, image 61/5. -New patchy area of tree-in-bud nodularity within the right middle lobe, is likely postinflammatory/infectious, image 107/5. Musculoskeletal: Interval left third and fourth anterior rib fractures. No suspicious bone lesions. CT ABDOMEN PELVIS FINDINGS Hepatobiliary: No focal liver abnormality is seen. No gallstones, gallbladder wall thickening, or biliary dilatation. Pancreas: Unremarkable. No pancreatic ductal dilatation or surrounding inflammatory changes. Spleen: Normal in size without focal abnormality. Adrenals/Urinary Tract: Adrenal glands are unremarkable. Kidneys are normal, without renal calculi, focal lesion, or hydronephrosis. Bladder is unremarkable. Stomach/Bowel: Percutaneous gastrostomy tube is identified in the left upper quadrant of the abdomen. No bowel wall thickening, inflammation or bowel distension identified. Vascular/Lymphatic:  Aortic atherosclerosis. No aneurysm. No abdominopelvic adenopathy identified. Reproductive: Status post hysterectomy. No adnexal masses. Other: None Musculoskeletal: No acute or significant osseous findings. IMPRESSION: 1. The irregular configured nodular area of architectural distortion is unchanged when compared with the previous exam. This lesion previously exhibit low level FDG uptake with maximum SUV of 2.0. 2. No new or progressive nodules identified within the lungs. 3. Left main and 3 vessel coronary artery calcifications noted. 4. Interval left third and fourth anterior rib fractures. Aortic Atherosclerosis (ICD10-I70.0) and Emphysema (ICD10-J43.9). Electronically Signed   By: TKerby MoorsM.D.   On: 06/07/2019 12:52   UKoreaCarotid Bilateral  Result Date: 05/17/2019 CLINICAL DATA:  Carotid artery stenosis. EXAM: BILATERAL CAROTID DUPLEX ULTRASOUND TECHNIQUE: GPearline Cablesscale imaging, color Doppler and duplex ultrasound were performed of bilateral carotid and vertebral arteries in the neck. COMPARISON:  CT of the neck dated March 07, 2019. FINDINGS: Criteria: Quantification of carotid stenosis is based on velocity parameters that correlate the residual internal carotid diameter with NASCET-based stenosis levels, using the diameter of the distal internal carotid lumen as the denominator for stenosis measurement. The following velocity measurements were obtained: RIGHT ICA: 70 cm/sec CCA: 76 cm/sec SYSTOLIC ICA/CCA RATIO:  0.9 ECA: 79 cm/sec LEFT ICA: 115 cm/sec CCA: 53 cm/sec SYSTOLIC ICA/CCA RATIO:  2 ECA: 87 cm/sec RIGHT CAROTID ARTERY: There are atherosclerotic changes throughout the right common  carotid artery resulting in less than 50% stenosis by grayscale imaging in the proximal portion of the artery. In the distal portion of the artery there may be greater than 50% narrowing by grayscale imaging. There is less than 50% stenosis by grayscale imaging involving the right ICA. RIGHT VERTEBRAL ARTERY:   Antegrade flow is noted. LEFT CAROTID ARTERY: Atherosclerotic plaque is noted in the left common carotid artery with less than 50% stenosis by grayscale imaging. Atherosclerotic plaque is noted in the carotid bulb. There is moderate atherosclerotic plaque involving the left ICA resulting in greater than 50% stenosis by grayscale imaging. LEFT VERTEBRAL ARTERY:  Antegrade flow is noted. IMPRESSION: 1. Atherosclerotic disease of the right ICA resulting in left than 50% stenosis by both color Doppler and gray scale imaging. 2. Atherosclerotic disease involving the left ICA likely resulting in less than 50% stenosis of the left ICA despite a borderline ICA/CCA ratio. Electronically Signed   By: Constance Holster M.D.   On: 05/17/2019 12:23    PERFORMANCE STATUS (ECOG) : 3 - Symptomatic, >50% confined to bed  Review of Systems Unless otherwise noted, a complete review of systems is negative.  Physical Exam General: NAD, frail appearing, thin, in wheelchair Pulmonary: Unlabored Abdomen: soft, nontender, PEG noted but not visualized GU: no suprapubic tenderness Extremities: no edema, no joint deformities Skin: no rashes Neurological: Weakness but otherwise nonfocal  IMPRESSION: Routine follow-up visit today.  Patient reports feeling "ichy" but denies any specific changes or concerns.  I called and spoke with patient's husband by phone.  He also denies any acute changes or concerns.  He continues to endorse generalized pruritus without rash.  He attributes this to the fentanyl patch.  We will plan to discontinue.  Case and plan discussed with Dr. Grayland Ormond  PLAN: -Continue current scope of treatment -Discontinue fentanyl -Continue morphine elixir 5 mg every 4 hours as needed for pain -RTC in 3 months   Patient expressed understanding and was in agreement with this plan. She also understands that She can call the clinic at any time with any questions, concerns, or complaints.     Time Total:  15 minutes  Visit consisted of counseling and education dealing with the complex and emotionally intense issues of symptom management and palliative care in the setting of serious and potentially life-threatening illness.Greater than 50%  of this time was spent counseling and coordinating care related to the above assessment and plan.  Signed by: Altha Harm, PhD, NP-C [phone] (Work Cell)

## 2019-06-09 NOTE — Progress Notes (Signed)
Nutrition Follow-up:  Patient with head and neck cancer stage IV and adenocarcinoma of colon.  Patient followed by Dr. Grayland Ormond and Palliative care.  Patient not currently receiving treatment.  Has PEG tube for nutrition.   Met with patient in clinic and she reports that she is doing ok with tube feeding.  RD called and spoke with husband.  Reports that he continues to give 5 cartons of ensure plus (700, 1000, 1300, 1600, 1900).  Confirms that he give water flush of 15m before and after feeding and at least 30-478mof water with medications, usually give medications with at least 4 of feedings.  Husband reports some episodes of diarrhea especially after contrast given for CT scan.  Overall not having issues with diarrhea per husband.    Medications: questran, lansoprazole, morphine, compazine  Labs: reviewed  Anthropometrics:   Weight no taken today.    Husband reports at home weighing 110 lb   Estimated Energy Needs  Kcals: 1250-1500 calories Protein: 62-75 g Fluid: 1.5 L  NUTRITION DIAGNOSIS: Inadequate oral intake but relying on feeding tube   INTERVENTION:  Continue ensure plus 5 times per day via feeding tube.  Husband to continue to give at least 3 hours apart.  Provides 1750 calories, 65 g protein, 1200 ml free water with formula and flush not including water with medications  Husband has RD contact information if needed    MONITORING, EVALUATION, GOAL: weight trends, TF tolerance   NEXT VISIT: Jan 25 in clinic  JoCoyote AcresAlZenia ResidesRDBlue Clay FarmsLDSharonegistered Dietitian [phone])

## 2019-06-14 ENCOUNTER — Telehealth: Payer: Self-pay | Admitting: *Deleted

## 2019-06-14 NOTE — Telephone Encounter (Signed)
Mr Fluellen called and wanted to be sure we updated Dana Perez medicine list to show she is not taking medicine orally, but per tube and that Mirtazapine be added to her medicine list from her PCP 7.5 mg every hs. I edited her list to reflect this

## 2019-06-16 DIAGNOSIS — C189 Malignant neoplasm of colon, unspecified: Secondary | ICD-10-CM | POA: Diagnosis not present

## 2019-06-16 DIAGNOSIS — C760 Malignant neoplasm of head, face and neck: Secondary | ICD-10-CM | POA: Diagnosis not present

## 2019-06-18 DIAGNOSIS — Z79891 Long term (current) use of opiate analgesic: Secondary | ICD-10-CM | POA: Diagnosis not present

## 2019-06-18 DIAGNOSIS — M810 Age-related osteoporosis without current pathological fracture: Secondary | ICD-10-CM | POA: Diagnosis not present

## 2019-06-18 DIAGNOSIS — C4442 Squamous cell carcinoma of skin of scalp and neck: Secondary | ICD-10-CM | POA: Diagnosis not present

## 2019-06-18 DIAGNOSIS — E7800 Pure hypercholesterolemia, unspecified: Secondary | ICD-10-CM | POA: Diagnosis not present

## 2019-06-18 DIAGNOSIS — I129 Hypertensive chronic kidney disease with stage 1 through stage 4 chronic kidney disease, or unspecified chronic kidney disease: Secondary | ICD-10-CM | POA: Diagnosis not present

## 2019-06-18 DIAGNOSIS — Z9181 History of falling: Secondary | ICD-10-CM | POA: Diagnosis not present

## 2019-06-18 DIAGNOSIS — K259 Gastric ulcer, unspecified as acute or chronic, without hemorrhage or perforation: Secondary | ICD-10-CM | POA: Diagnosis not present

## 2019-06-18 DIAGNOSIS — Z85038 Personal history of other malignant neoplasm of large intestine: Secondary | ICD-10-CM | POA: Diagnosis not present

## 2019-06-18 DIAGNOSIS — I6529 Occlusion and stenosis of unspecified carotid artery: Secondary | ICD-10-CM | POA: Diagnosis not present

## 2019-06-18 DIAGNOSIS — Z931 Gastrostomy status: Secondary | ICD-10-CM | POA: Diagnosis not present

## 2019-06-18 DIAGNOSIS — E46 Unspecified protein-calorie malnutrition: Secondary | ICD-10-CM | POA: Diagnosis not present

## 2019-06-18 DIAGNOSIS — Z87891 Personal history of nicotine dependence: Secondary | ICD-10-CM | POA: Diagnosis not present

## 2019-06-18 DIAGNOSIS — Z8701 Personal history of pneumonia (recurrent): Secondary | ICD-10-CM | POA: Diagnosis not present

## 2019-06-18 DIAGNOSIS — N183 Chronic kidney disease, stage 3 unspecified: Secondary | ICD-10-CM | POA: Diagnosis not present

## 2019-06-23 DIAGNOSIS — M810 Age-related osteoporosis without current pathological fracture: Secondary | ICD-10-CM | POA: Diagnosis not present

## 2019-06-23 DIAGNOSIS — Z8701 Personal history of pneumonia (recurrent): Secondary | ICD-10-CM | POA: Diagnosis not present

## 2019-06-23 DIAGNOSIS — N183 Chronic kidney disease, stage 3 unspecified: Secondary | ICD-10-CM | POA: Diagnosis not present

## 2019-06-23 DIAGNOSIS — I129 Hypertensive chronic kidney disease with stage 1 through stage 4 chronic kidney disease, or unspecified chronic kidney disease: Secondary | ICD-10-CM | POA: Diagnosis not present

## 2019-06-23 DIAGNOSIS — K259 Gastric ulcer, unspecified as acute or chronic, without hemorrhage or perforation: Secondary | ICD-10-CM | POA: Diagnosis not present

## 2019-06-23 DIAGNOSIS — Z79891 Long term (current) use of opiate analgesic: Secondary | ICD-10-CM | POA: Diagnosis not present

## 2019-06-23 DIAGNOSIS — Z87891 Personal history of nicotine dependence: Secondary | ICD-10-CM | POA: Diagnosis not present

## 2019-06-23 DIAGNOSIS — Z85038 Personal history of other malignant neoplasm of large intestine: Secondary | ICD-10-CM | POA: Diagnosis not present

## 2019-06-23 DIAGNOSIS — I6529 Occlusion and stenosis of unspecified carotid artery: Secondary | ICD-10-CM | POA: Diagnosis not present

## 2019-06-23 DIAGNOSIS — Z9181 History of falling: Secondary | ICD-10-CM | POA: Diagnosis not present

## 2019-06-23 DIAGNOSIS — C4442 Squamous cell carcinoma of skin of scalp and neck: Secondary | ICD-10-CM | POA: Diagnosis not present

## 2019-06-23 DIAGNOSIS — E46 Unspecified protein-calorie malnutrition: Secondary | ICD-10-CM | POA: Diagnosis not present

## 2019-06-23 DIAGNOSIS — E7800 Pure hypercholesterolemia, unspecified: Secondary | ICD-10-CM | POA: Diagnosis not present

## 2019-06-23 DIAGNOSIS — Z931 Gastrostomy status: Secondary | ICD-10-CM | POA: Diagnosis not present

## 2019-06-24 ENCOUNTER — Telehealth: Payer: Self-pay | Admitting: *Deleted

## 2019-06-24 NOTE — Telephone Encounter (Signed)
Mr Edholm called requesting a return call from Sharion Dove, NP regarding [place] is having discomfort and he wants to discuss. [phone].

## 2019-06-24 NOTE — Telephone Encounter (Signed)
I tried calling husband but could not reach him. Message left.

## 2019-06-27 ENCOUNTER — Telehealth: Payer: Self-pay | Admitting: *Deleted

## 2019-06-27 NOTE — Telephone Encounter (Signed)
Mr Urben called reporting that Axa is having discomfort all day long and that the Tylenol and MS is not lasting for more than 30 minutes. He is requesting a call from Billey Chang, NP to discuss this and what can be done for her. He states she is hurting all over. Please return his call [phone]

## 2019-06-28 ENCOUNTER — Other Ambulatory Visit: Payer: Self-pay

## 2019-06-28 ENCOUNTER — Encounter (INDEPENDENT_AMBULATORY_CARE_PROVIDER_SITE_OTHER): Payer: Self-pay

## 2019-06-28 ENCOUNTER — Ambulatory Visit (INDEPENDENT_AMBULATORY_CARE_PROVIDER_SITE_OTHER): Payer: PPO | Admitting: Gastroenterology

## 2019-06-28 ENCOUNTER — Encounter: Payer: Self-pay | Admitting: Gastroenterology

## 2019-06-28 VITALS — BP 96/65 | HR 98 | Resp 17 | Wt 111.2 lb

## 2019-06-28 DIAGNOSIS — K253 Acute gastric ulcer without hemorrhage or perforation: Secondary | ICD-10-CM | POA: Diagnosis not present

## 2019-06-28 NOTE — Progress Notes (Signed)
Cephas Darby, MD [address]  [place]  [place], [place] [address]  Main: [phone]  Fax: [phone]    Gastroenterology Consultation  Referring Provider:     [name], MD Primary Care Physician:  [name], MD Primary Gastroenterologist:  Dr. Cephas Darby Reason for Consultation:     History of peptic ulcer disease, hospital follow-up        HPI:   LOUANNA VANLIEW is a 78 y.o. female referred by Dr. 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  Patient was admitted to [facility] on 05/16/2019 secondary to melena, blood around the G-tube, with drop in hemoglobin from 15.7 to 11.9.  Patient underwent upper endoscopy which revealed clean-based gastric ulcer and was discharged home on proton pump inhibitor  Patient is accompanied by her husband today.  She was seen by Dr. Grayland Ormond since discharge, her hemoglobin is back to baseline.  She is currently on Prevacid daily.  Her husband denies any active GI issues today.  He reports the G-tube is working well.  No longer noticing black stools.   NSAIDs: None  Antiplts/Anticoagulants/Anti thrombotics: None  GI Procedures:  EGD 05/17/19 - Normal duodenal bulb and second portion of the duodenum. - Non-bleeding gastric ulcer with a clean ulcer base (Forrest Class III). - Erythematous mucosa in the gastric fundus. - Normal gastroesophageal junction and esophagus. - No specimens collected.  Past Medical History:  Diagnosis Date  . Adenocarcinoma of colon ([place])   . Benign neoplasm of ascending colon   . Essential hypertension 06/13/2014  . Goals of care, counseling/discussion 07/14/2017  . HLD  (hyperlipidemia) 06/13/2014  . Hypercholesteremia   . Hyperlipidemia   . Multiple thyroid nodules 01/22/2012  . Osteoporosis   . Polycythemia, secondary 12/26/2014  . Pure hypercholesterolemia 03/15/2015  . Squamous cell carcinoma of head and neck ([place])   . Thyroid nodule     Past Surgical History:  Procedure Laterality Date  . ABDOMINAL HYSTERECTOMY    . COLONOSCOPY WITH PROPOFOL N/A 07/02/2017   Procedure: COLONOSCOPY WITH PROPOFOL;  Surgeon: Lucilla Lame, MD;  Location: [place];  Service: Endoscopy;  Laterality: N/A;  . ESOPHAGOGASTRODUODENOSCOPY N/A 05/17/2019   Procedure: ESOPHAGOGASTRODUODENOSCOPY (EGD);  Surgeon: Lin Landsman, MD;  Location: [facility] ENDOSCOPY;  Service: Gastroenterology;  Laterality: N/A;  . ORIF ANKLE FRACTURE Left 08/08/2017   Procedure: OPEN REDUCTION INTERNAL FIXATION (ORIF) ANKLE FRACTURE;  Surgeon: Dereck Leep, MD;  Location: [facility] ORS;  Service: Orthopedics;  Laterality: Left;  . POLYPECTOMY N/A 07/02/2017   Procedure: POLYPECTOMY;  Surgeon: Lucilla Lame, MD;  Location: [place];  Service: Endoscopy;  Laterality: N/A;  . WRIST FRACTURE SURGERY  08/2009   badly broken    Current Outpatient Medications:  .  calcium carbonate (OS-CAL) 600 MG TABS tablet, Take by mouth., Disp: , Rfl:  .  cholestyramine (QUESTRAN) 4 GM/DOSE powder, Take 1 packet (4 g total) by mouth 3 (three) times daily with meals. (Patient taking differently: Place 4 g into feeding tube 3 (three) times daily with meals. ), Disp: 378 g, Rfl: 12 .  clotrimazole-betamethasone (LOTRISONE) cream, APPLY TO AFFECTED AREA TWICE A DAY, Disp: , Rfl:  .  Ensure Plus (ENSURE PLUS) LIQD,  Give 1 carton of ensure plus 5 times daily via feeding tube.  Flush with 48m of water before and after feeding., Disp: 1185 mL, Rfl: 0 .  lansoprazole (PREVACID SOLUTAB) 30 MG disintegrating tablet, Place 1 tablet (30 mg total) into feeding tube 2 (two) times daily., Disp: 60 tablet, Rfl: 0 .   morphine (ROXANOL) 20 MG/ML concentrated solution, Place 0.25 mLs (5 mg total) into feeding tube every 4 (four) hours as needed for moderate pain or severe pain., Disp: 30 mL, Rfl: 0 .  NARCAN 4 MG/0.1ML LIQD nasal spray kit, USE 1 SPRAY PER INTRANASAL ROUTE AS DIRECTED, Disp: , Rfl: 0 .  Polyethylene Glycol 3350 (PEG 3350) 17 GM/SCOOP POWD, Place 17 g into feeding tube 1 day or 1 dose. , Disp: , Rfl:  .  pravastatin (PRAVACHOL) 10 MG tablet, Place 10 mg into feeding tube daily. , Disp: , Rfl:  .  prochlorperazine (COMPAZINE) 10 MG tablet, Take 1 tablet (10 mg total) by mouth every 6 (six) hours as needed for nausea or vomiting. (Patient taking differently: Place 10 mg into feeding tube every 6 (six) hours as needed for nausea or vomiting. ), Disp: 60 tablet, Rfl: 2 .  mirtazapine (REMERON) 7.5 MG tablet, Place 7.5 mg into feeding tube at bedtime., Disp: , Rfl:  No current facility-administered medications for this visit.   Facility-Administered Medications Ordered in Other Visits:  .  heparin lock flush 100 unit/mL, 250 Units, Intracatheter, PRN, PMa Hillock Sandeep, MD .  heparin lock flush 100 unit/mL, 500 Units, Intracatheter, PRN, PMa Hillock Sandeep, MD .  potassium chloride 20 mEq in sodium chloride 0.9 % 500 mL injection, , Intravenous, Once, Saunders, Doni H, RN .  sodium chloride 0.9 % injection 10 mL, 10 mL, Intracatheter, PRN, PMa Hillock Sandeep, MD .  sodium chloride 0.9 % injection 10 mL, 10 mL, Intracatheter, PRN, PMa Hillock Sandeep, MD .  sodium chloride 0.9 % injection 10 mL, 10 mL, Intracatheter, PRN, PMa Hillock Sandeep, MD .  sodium chloride 0.9 % injection 10 mL, 10 mL, Intracatheter, PRN, PLeia Alf MD   Family History  Problem Relation Age of Onset  . Heart disease Mother   . Diabetes Father      Social History   Tobacco Use  . Smoking status: Former Smoker    Packs/day: 0.25    Types: Cigarettes    Quit date: 06/17/2017    Years since quitting: 2.0  . Smokeless tobacco:  Never Used  Substance Use Topics  . Alcohol use: No  . Drug use: No    Allergies as of 06/28/2019 - Review Complete 06/28/2019  Allergen Reaction Noted  . Potassium Nausea Only and Nausea And Vomiting 04/05/2018  . Other Other (See Comments) and Rash 05/25/2014  . Tetanus antitoxin Rash 10/13/2017    Review of Systems:    All systems reviewed and negative except where noted in HPI.   Physical Exam:  BP 96/65 (BP Location: Left Arm, Patient Position: Sitting, Cuff Size: Normal)   Pulse 98   Resp 17   Wt 111 lb 3.2 oz (50.4 kg)   BMI 17.68 kg/m  No LMP recorded. Patient has had a hysterectomy.  General: Ill-appearing, not in distress, thin built Head:  Normocephalic and atraumatic, bitemporal wasting. Eyes:   No icterus.   Conjunctiva pink. PERRLA. Ears: Decreased auditory acuity. Neck:  Supple; no masses or thyroidomegaly Lungs: Respirations even and unlabored. Lungs clear to auscultation bilaterally.   No wheezes, crackles, or rhonchi.  Heart:  Regular rate and  rhythm;  Without murmur, clicks, rubs or gallops Abdomen:  Soft, nondistended, nontender.  Skin around the G-tube site is erythematous, no purulent drainage, normal bowel sounds. No appreciable masses or hepatomegaly.  No rebound or guarding.  Rectal:  Not performed. Msk:  Symmetrical without gross deformities.   Extremities:  Without edema, cyanosis or clubbing. Neurologic:  Alert and oriented x2;  grossly normal neurologically. Skin: Thick fibrotic skin on the neck and jaw from radiation  Psych:  Alert and cooperative  Imaging Studies: Reviewed  Assessment and Plan:   LILLI DEWALD is a 78 y.o. female with history of stage IV squamous cell carcinoma of head and neck status post chemo, radiation, surgery, also with adenocarcinoma of the ascending colon which is untreated, recent history of melena, found to have clean-based gastric ulcer and upper endoscopy.  Upper GI bleed has resolved.  Hemoglobin is back to  normal.  Continue Prevacid 30 mg 1-2 times daily long-term.  No further work-up from GI standpoint at this time   Follow up as needed   Cephas Darby, MD

## 2019-06-28 NOTE — Telephone Encounter (Signed)
I spoke with patient's husband. We discussed using the morphine elixir with increased frequency, which he reports does provide her with short term relief. I offered to restart a long acting opioid but husband declined.

## 2019-06-29 DIAGNOSIS — C148 Malignant neoplasm of overlapping sites of lip, oral cavity and pharynx: Secondary | ICD-10-CM | POA: Diagnosis not present

## 2019-06-29 DIAGNOSIS — R42 Dizziness and giddiness: Secondary | ICD-10-CM | POA: Diagnosis not present

## 2019-06-29 DIAGNOSIS — L299 Pruritus, unspecified: Secondary | ICD-10-CM | POA: Diagnosis not present

## 2019-06-29 DIAGNOSIS — H903 Sensorineural hearing loss, bilateral: Secondary | ICD-10-CM | POA: Diagnosis not present

## 2019-07-04 ENCOUNTER — Other Ambulatory Visit: Payer: Self-pay | Admitting: *Deleted

## 2019-07-04 MED ORDER — MORPHINE SULFATE (CONCENTRATE) 20 MG/ML PO SOLN: 5.0000 mg | ORAL | 0 refills | Status: DC | PRN

## 2019-07-12 DIAGNOSIS — C760 Malignant neoplasm of head, face and neck: Secondary | ICD-10-CM | POA: Diagnosis not present

## 2019-07-12 DIAGNOSIS — C189 Malignant neoplasm of colon, unspecified: Secondary | ICD-10-CM | POA: Diagnosis not present

## 2019-07-21 DIAGNOSIS — K9423 Gastrostomy malfunction: Secondary | ICD-10-CM | POA: Diagnosis not present

## 2019-07-25 ENCOUNTER — Telehealth: Payer: Self-pay | Admitting: Hospice and Palliative Medicine

## 2019-07-25 NOTE — Telephone Encounter (Signed)
I received a call from patient's husband.  He says the patient has had chronic discomfort.  Symptoms are relieved with use of morphine and acetaminophen but medications are short-lived.  He is currently giving the morphine every 6 hours and giving the acetaminophen every 6 hours on a staggered schedule.  I offered again to reinitiate a long-acting opioid but patient's husband declined.  He felt that patient had adverse effects from the fentanyl.  Alternatively, we could try Xtampza ER but husband was not interested.  I suggested that he could increase the dose of the morphine to every 3-4 hours as needed.

## 2019-08-01 ENCOUNTER — Other Ambulatory Visit: Payer: Self-pay | Admitting: *Deleted

## 2019-08-01 IMAGING — DX DG ANKLE COMPLETE 3+V*L*
3 series · 3 of 3 positions shown · non-contrast
Comparison: None.

CLINICAL DATA: Left ankle pain and swelling after fall today.

EXAM:
LEFT ANKLE COMPLETE - 3+ VIEW

[ankle ap]
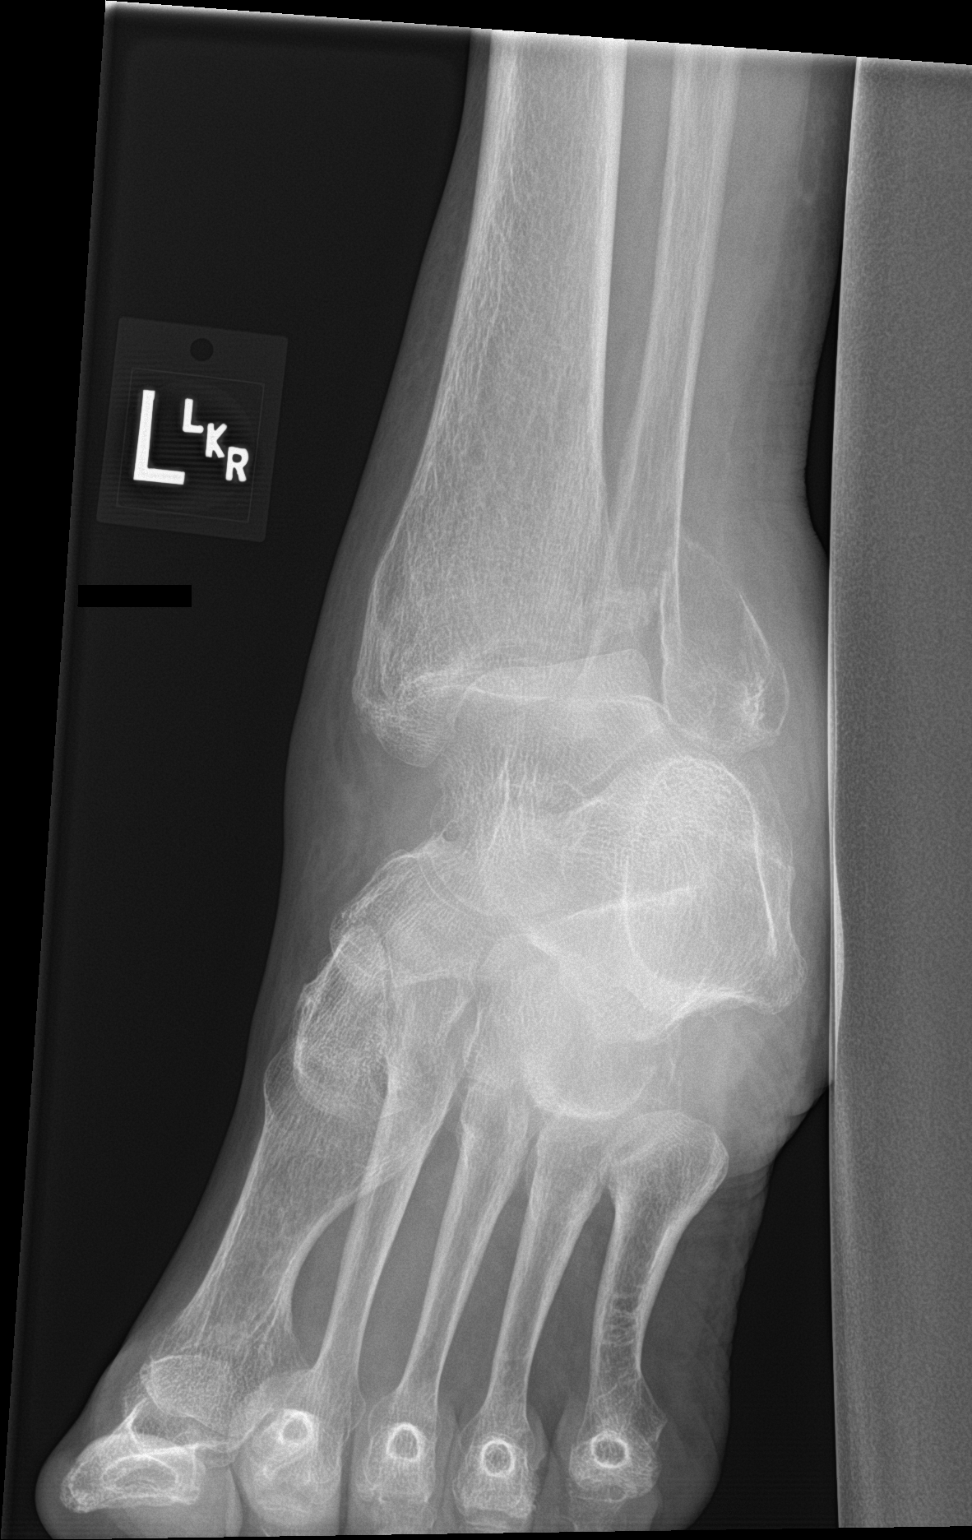

[ankle obl]
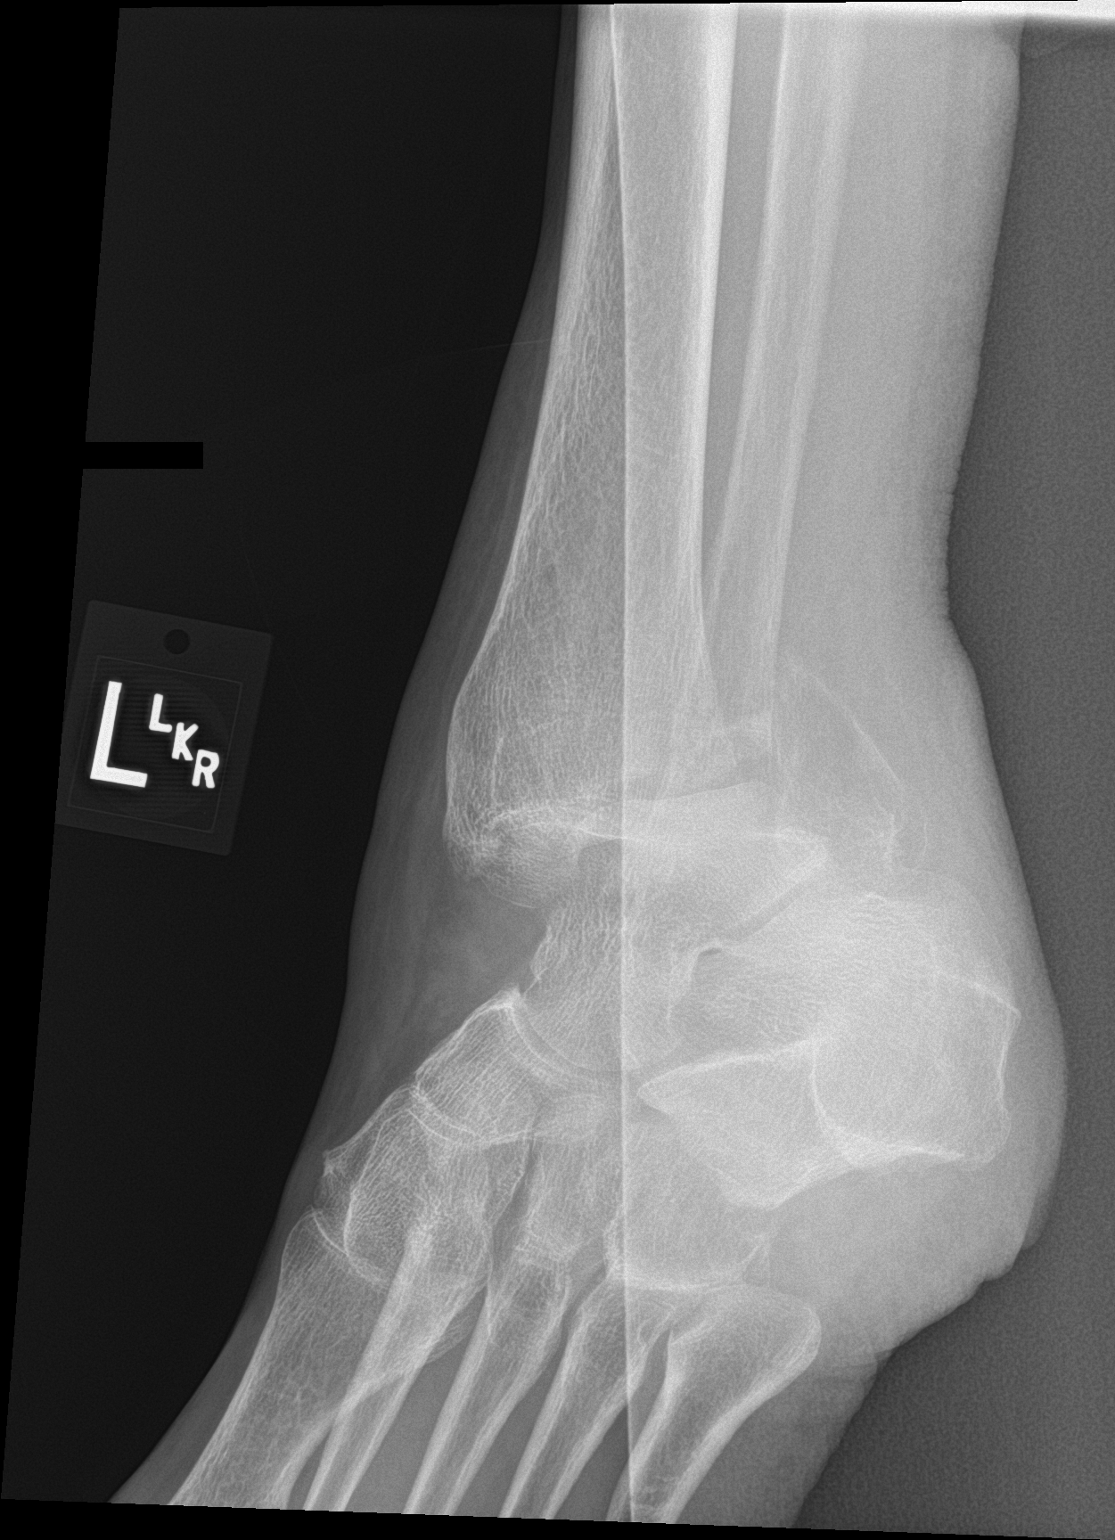

[ankle lat]
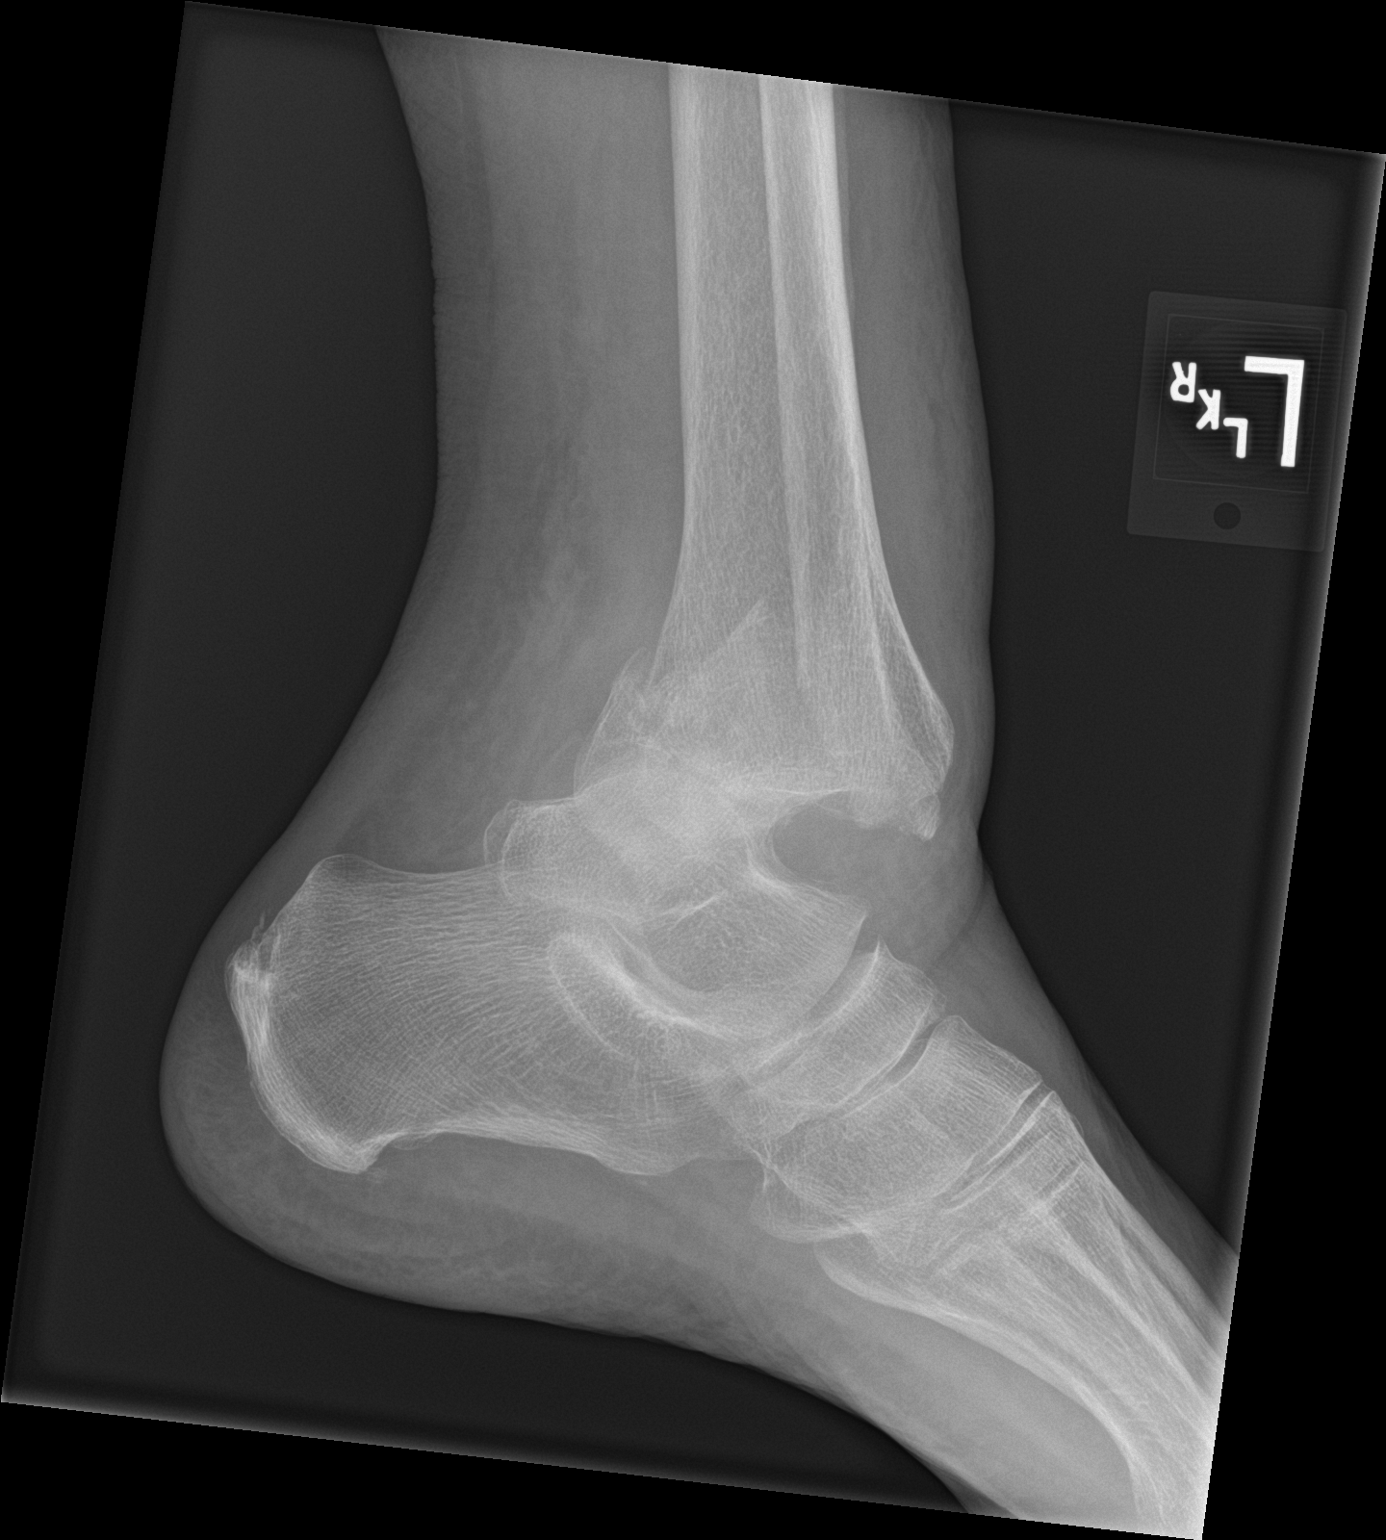

[3 of 3 positions shown; findings below may reference images not displayed]

FINDINGS: There is severe posterior dislocation of talus relative to distal
tibia, with severely displaced fractures involving the distal fibula
and posterior malleolus. No definite soft tissue abnormality is
noted.
IMPRESSION: Severe posterior dislocation of talus relative to distal tibia, with
severely displaced fractures of the distal fibula and posterior
malleolus.

## 2019-08-01 IMAGING — DX DG ANKLE 2V *L*
2 series · 2 of 2 positions shown · non-contrast
Comparison: Left ankle x-rays earlier today at [DATE] a.m..

CLINICAL DATA: Post reduction trimalleolar left ankle
fracture-dislocation.

EXAM:
LEFT ANKLE - 2 VIEW [DATE] p.m.:

[ankle ap]
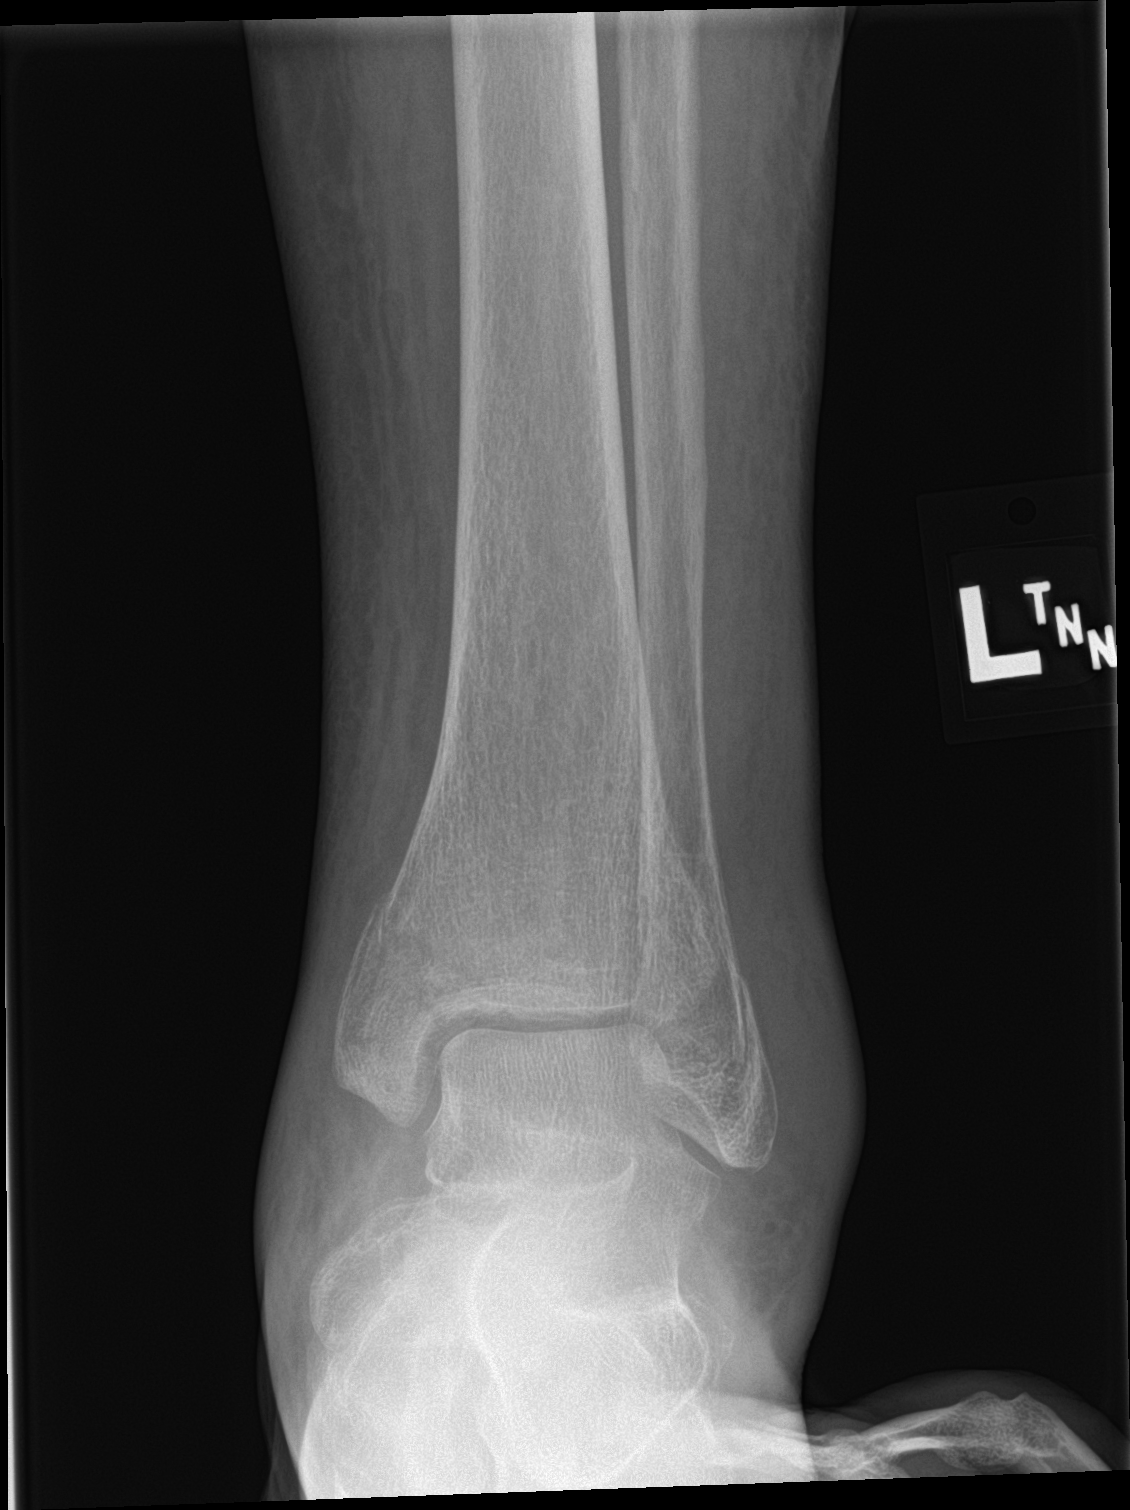

[ankle lat]
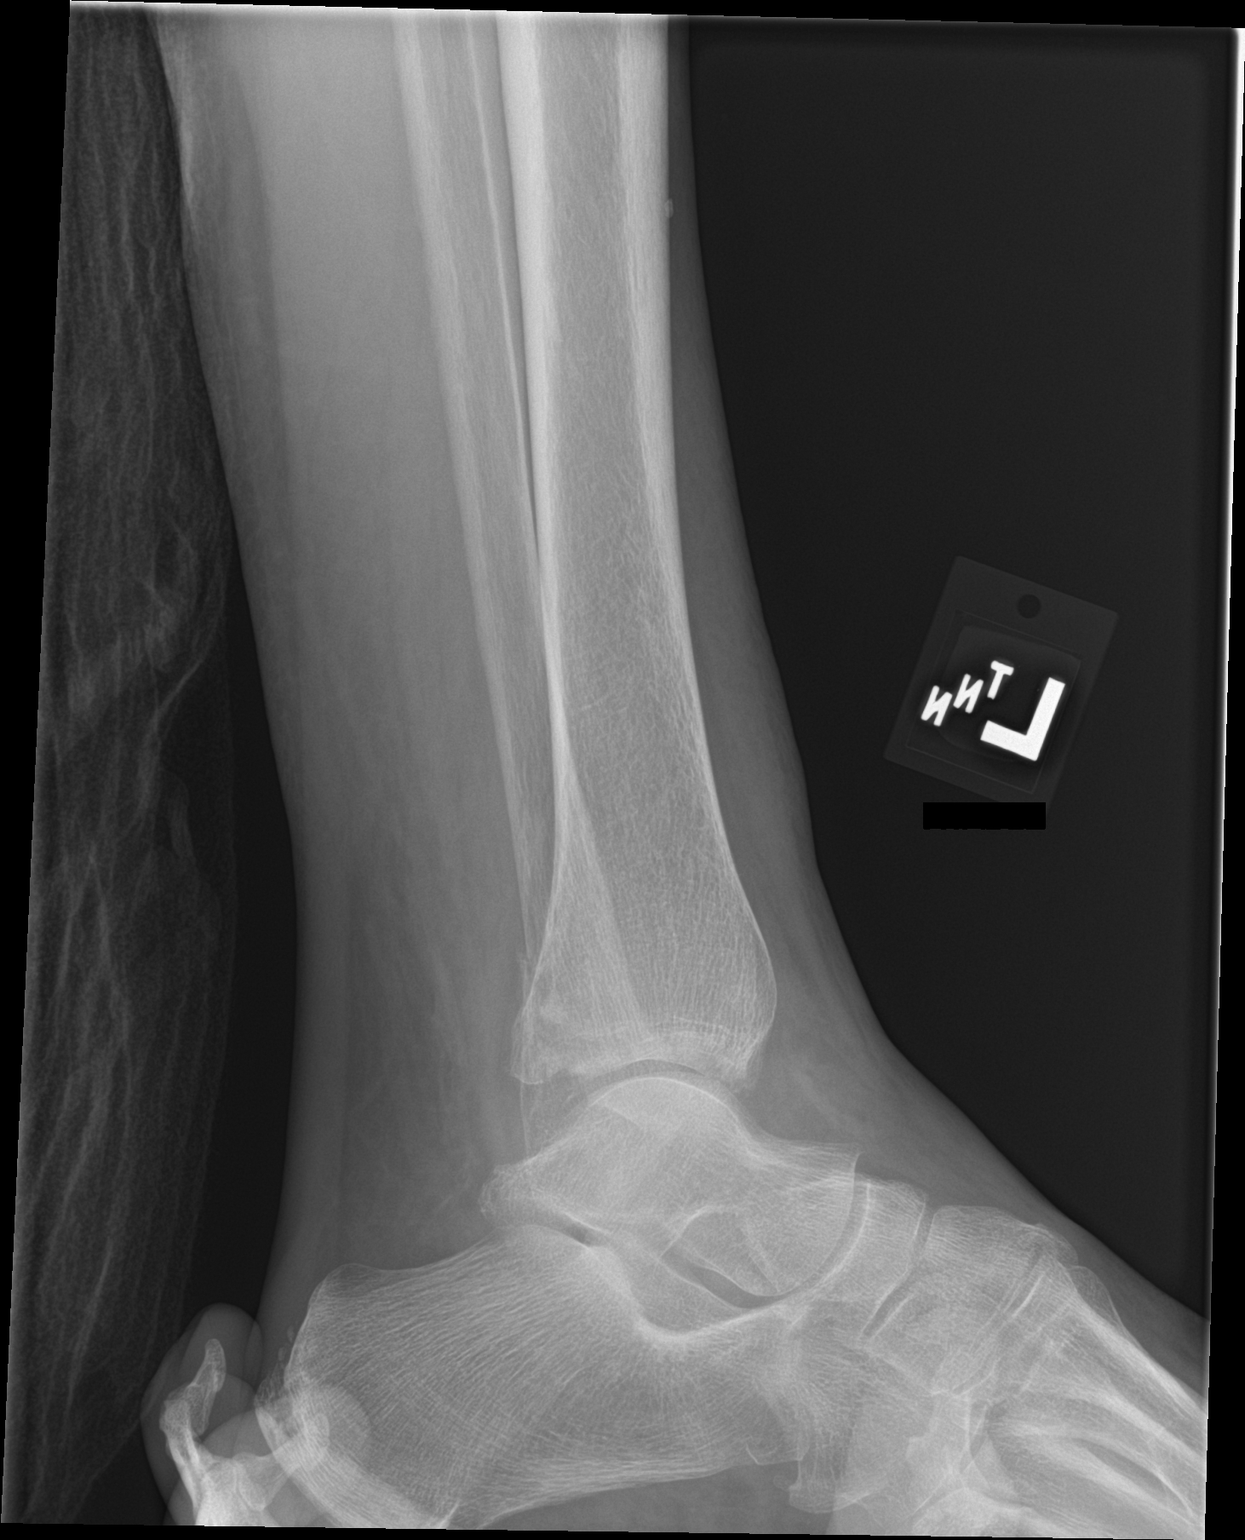

[2 of 2 positions shown; findings below may reference images not displayed]

FINDINGS: Anatomic alignment of the ankle mortise post reduction. Significant
improvement in alignment of the trimalleolar fractures post
reduction.
IMPRESSION: 1. Anatomic alignment of the ankle joint post reduction.
2. Significant improvement in alignment of the trimalleolar
fractures post reduction.

## 2019-08-01 MED ORDER — PROCHLORPERAZINE MALEATE 10 MG PO TABS: 10.0000 mg | ORAL_TABLET | Freq: Four times a day (QID) | ORAL | 2 refills | Status: DC | PRN

## 2019-08-11 DIAGNOSIS — C760 Malignant neoplasm of head, face and neck: Secondary | ICD-10-CM | POA: Diagnosis not present

## 2019-08-11 DIAGNOSIS — C189 Malignant neoplasm of colon, unspecified: Secondary | ICD-10-CM | POA: Diagnosis not present

## 2019-08-16 ENCOUNTER — Other Ambulatory Visit: Payer: Self-pay | Admitting: *Deleted

## 2019-08-16 MED ORDER — MORPHINE SULFATE (CONCENTRATE) 20 MG/ML PO SOLN: 5.0000 mg | ORAL | 0 refills | Status: DC | PRN

## 2019-08-30 DIAGNOSIS — R682 Dry mouth, unspecified: Secondary | ICD-10-CM | POA: Diagnosis not present

## 2019-08-30 DIAGNOSIS — N1831 Chronic kidney disease, stage 3a: Secondary | ICD-10-CM | POA: Diagnosis not present

## 2019-08-30 DIAGNOSIS — R5383 Other fatigue: Secondary | ICD-10-CM | POA: Diagnosis not present

## 2019-08-30 DIAGNOSIS — Z0000 Encounter for general adult medical examination without abnormal findings: Secondary | ICD-10-CM | POA: Diagnosis not present

## 2019-08-30 DIAGNOSIS — C760 Malignant neoplasm of head, face and neck: Secondary | ICD-10-CM | POA: Diagnosis not present

## 2019-08-30 DIAGNOSIS — R5381 Other malaise: Secondary | ICD-10-CM | POA: Diagnosis not present

## 2019-08-30 DIAGNOSIS — L598 Other specified disorders of the skin and subcutaneous tissue related to radiation: Secondary | ICD-10-CM | POA: Diagnosis not present

## 2019-08-31 DIAGNOSIS — R682 Dry mouth, unspecified: Secondary | ICD-10-CM | POA: Diagnosis not present

## 2019-08-31 DIAGNOSIS — L299 Pruritus, unspecified: Secondary | ICD-10-CM | POA: Diagnosis not present

## 2019-08-31 DIAGNOSIS — R42 Dizziness and giddiness: Secondary | ICD-10-CM | POA: Diagnosis not present

## 2019-08-31 DIAGNOSIS — C148 Malignant neoplasm of overlapping sites of lip, oral cavity and pharynx: Secondary | ICD-10-CM | POA: Diagnosis not present

## 2019-09-10 NOTE — Progress Notes (Signed)
[facility]  Telephone:([phone]) [phone] Fax:(336) [phone]  ID: Dana Perez OB: 1940/09/04  MR#: [id number]  IOE#:[id number]  Patient Care Team: [name], MD as PCP - General (Physician Assistant) [name], MD as Referring Physician (Surgery)  CHIEF COMPLAINT:  Recurrent, progressive stage IVa head and neck squamous cell carcinoma, adenocarcinoma of the colon.  INTERVAL HISTORY: Patient returns to clinic today for routine 33-monthevaluation.  She continues to have chronic weakness and fatigue.  Her husband answers most of the questions and he reports no new complaints.  She continues to require 100% of nutrition via her PEG tube. She has no neurologic complaints.  She denies any recent fevers or illnesses.  She denies any chest pain, shortness of breath, cough, or hemoptysis.  She has no nausea, vomiting, constipation, or diarrhea.  She has no urinary complaints.  Patient offers no further specific complaints today.  REVIEW OF SYSTEMS:   Review of Systems  Constitutional: Positive for malaise/fatigue. Negative for fever and weight loss.  HENT: Negative.  Negative for sore throat.   Respiratory: Negative.  Negative for cough, shortness of breath and stridor.   Cardiovascular: Negative.  Negative for chest pain and leg swelling.  Gastrointestinal: Negative.  Negative for abdominal pain, constipation, diarrhea, nausea and vomiting.  Genitourinary: Negative.  Negative for dysuria.  Musculoskeletal: Negative.  Negative for falls and joint pain.  Skin: Negative.  Negative for itching and rash.  Neurological: Positive for weakness. Negative for sensory change, focal weakness and headaches.  Psychiatric/Behavioral: Positive for memory loss. Negative for depression and hallucinations. The patient is not nervous/anxious and does not have insomnia.     As per HPI. Otherwise, a complete review of systems is negative.  PAST MEDICAL HISTORY: Past Medical  History:  Diagnosis Date  . [place]emic respiratory [place] 11/15/2017  . [place] 11/15[place]  . [place]   . Benign neoplasm of ascending colon   . CAP (community acquired pneumonia) 11/15/2017  . Essential hypertension 06/13/2014  . Goals of care, counseling/discussion 07/14/2017  . Healthcare-associated pneumonia   . HLD (hyperlipidemia) 06/13/2014  . Hypercholesteremia   . Hyperlipidemia   . Melena   . Multiple thyroid nodules 01/22/2012  . Osteoporosis   . Polycythemia, secondary 12/26/2014  . Pure hypercholesterolemia 03/15/2015  . [place]amous [place]   . Thyroid nodule     PAST SURGICAL HISTORY: Past Surgical History:  Procedure Laterality Date  . ABDOMINAL HYSTERECTOMY    . COLONOSCOPY WITH PROPOFOL N/A 07/02/2017   Procedure: COLONOSCOPY WITH PROPOFOL;  Surgeon: WLucilla Lame MD;  [place]  Service: Endoscopy;  Laterality: N/A;  . ESOPHAGOGASTRODUODENOSCOPY N/A 05/17/2019   Procedure: ESOPHAGOGASTRODUODENOSCOPY (EGD);  Surgeon: VLin Landsman MD;  [facility];  Service: Gastroenterology;  Laterality: N/A;  . ORIF ANKLE FRACTURE Left 08/08/2017   Procedure: OPEN REDUCTION INTERNAL FIXATION (ORIF) ANKLE FRACTURE;  Surgeon: HDereck Leep MD;  Location: [facility];  Service: Orthopedics;  Laterality: Left;  . POLYPECTOMY N/A 07/02/2017   Procedure: POLYPECTOMY;  Surgeon: WLucilla Lame MD;  [place]  Service: Endoscopy;  Laterality: N/A;  . WRIST FRACTURE SURGERY  08/2009   badly broken    FAMILY HISTORY Family History  Problem Relation Age of Onset  . Heart disease Mother   . Diabetes Father        ADVANCED DIRECTIVES:    HEALTH MAINTENANCE: Social History   Tobacco  Use  . Smoking status: Former Smoker    Packs/day: 0.25    Types: Cigarettes    Quit date: 06/17/2017    Years since quitting: 2.2  . Smokeless tobacco:  Never Used  Substance Use Topics  . Alcohol use: No  . Drug use: No     Allergies  Allergen Reactions  . Potassium Nausea Only and Nausea And Vomiting  . Other Other (See Comments) and Rash    TDAP tdap TDAP  . Tetanus Antitoxin Rash    Current Outpatient Medications  Medication Sig Dispense Refill  . acetaminophen (TYLENOL) 500 MG tablet Take by mouth.    . cholestyramine (QUESTRAN) 4 GM/DOSE powder Take 1 packet (4 g total) by mouth 3 (three) times daily with meals. (Patient taking differently: Place 4 g into feeding tube 3 (three) times daily with meals. ) 378 g 12  . clotrimazole-betamethasone (LOTRISONE) cream APPLY TO AFFECTED AREA TWICE A DAY    . Ensure Plus (ENSURE PLUS) LIQD Give 1 carton of ensure plus 5 times daily via feeding tube.  Flush with 21m of water before and after feeding. 1185 mL 0  . hydrOXYzine (ATARAX/VISTARIL) 10 MG tablet SMARTSIG:1 Tablet(s) By Mouth Every 12 Hours PRN    . mirtazapine (REMERON) 7.5 MG tablet Place 7.5 mg into feeding tube at bedtime.    .Marland Kitchenmorphine (ROXANOL) 20 MG/ML concentrated solution Place 0.25 mLs (5 mg total) into feeding tube every 4 (four) hours as needed for moderate pain or severe pain. 30 mL 0  . Polyethylene Glycol 3350 (PEG 3350) 17 GM/SCOOP POWD Place 17 g into feeding tube 1 day or 1 dose.     . pravastatin (PRAVACHOL) 10 MG tablet Place 10 mg into feeding tube daily.     . prochlorperazine (COMPAZINE) 10 MG tablet Place 1 tablet (10 mg total) into feeding tube every 6 (six) hours as needed for nausea or vomiting. 60 tablet 2  . calcium carbonate (OS-CAL) 600 MG TABS tablet Take by mouth.    . lansoprazole (PREVACID SOLUTAB) 30 MG disintegrating tablet Place 1 tablet (30 mg total) into feeding tube 2 (two) times daily. (Patient not taking: Reported on 09/12/2019) 60 tablet 0  . NARCAN 4 MG/0.1ML LIQD nasal spray kit USE 1 SPRAY PER INTRANASAL ROUTE AS DIRECTED  0  . XYLIMELTS 550 MG DISK TAKE 1 TABLET BY MOUTH EVERY 8 HOURS  AS NEEDED FOR DRY MOUTH     No current facility-administered medications for this visit.   Facility-Administered Medications Ordered in Other Visits  Medication Dose Route Frequency Provider Last Rate Last Admin  . heparin lock flush 100 unit/mL  250 Units Intracatheter PRN PLeia Alf MD      . heparin lock flush 100 unit/mL  500 Units Intracatheter PRN PLeia Alf MD      . potassium chloride 20 mEq in sodium chloride 0.9 % 500 mL injection   Intravenous Once STye Savoy Doni H, RN      . sodium chloride 0.9 % injection 10 mL  10 mL Intracatheter PRN PLeia Alf MD      . sodium chloride 0.9 % injection 10 mL  10 mL Intracatheter PRN PLeia Alf MD      . sodium chloride 0.9 % injection 10 mL  10 mL Intracatheter PRN PMa Hillock Sandeep, MD      . sodium chloride 0.9 % injection 10 mL  10 mL Intracatheter PRN PLeia Alf MD      -year-old  OBJECTIVE: Vitals:  09/12/19 1020 09/12/19 1023  BP: (!) 151/134 114/75  Pulse: 92 96  Resp: 20   Temp: (!) 96.5 F (35.8 C)   SpO2: 94% 94%     Body mass index is 18.44 kg/m.    ECOG FS:2 - Symptomatic, <50% confined to bed  General: Thin, no acute distress.  Sitting in a wheelchair. Eyes: Pink conjunctiva, anicteric sclera. HEENT: Normocephalic, moist mucous membranes.  Surgical changes noted.  No evidence of recurrent lymphadenopathy. Lungs: No audible wheezing or coughing. Heart: Regular rate and rhythm. Abdomen: Soft, nontender, no obvious distention. Musculoskeletal: No edema, cyanosis, or clubbing. Neuro: Alert, answering all questions appropriately. Cranial nerves grossly intact. Skin: No rashes or petechiae noted. Psych: Flat affect.  LAB RESULTS:  Lab Results  Component Value Date   NA 141 05/17/2019   K 3.8 05/17/2019   CL 114 (H) 05/17/2019   CO2 21 (L) 05/17/2019   GLUCOSE 91 05/17/2019   BUN 29 (H) 05/17/2019   CREATININE 0.86 06/07/2019   CALCIUM 8.0 (L) 05/17/2019   PROT 6.7 05/16/2019    ALBUMIN 3.7 05/16/2019   AST 32 05/16/2019   ALT 25 05/16/2019   ALKPHOS 91 05/16/2019   BILITOT 0.5 05/16/2019   GFRNONAA >60 06/07/2019   GFRAA >60 06/07/2019    Lab Results  Component Value Date   WBC 7.1 06/07/2019   NEUTROABS 5.0 06/07/2019   HGB 14.5 06/07/2019   HCT 44.5 06/07/2019   MCV 94.1 06/07/2019   PLT 265 06/07/2019     STUDIES: No results found.  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  PLAN:    1.  Recurrent, progressive stage IVa head and neck squamous cell carcinoma: Patient's last infusion of nivolumab was on November 11, 2018.  CT scan results from June 07, 2019 reviewed independently with no obvious evidence of recurrent or progressive disease.  Her performance status remains decreased and unchanged.  No intervention is needed at this time.  Appreciate ENT input.  Continue follow-up with them as scheduled.  Return to clinic in 3 months with repeat laboratory work and imaging and further evaluation.  Appreciate palliative care input. 2.  Diarrhea: Intermittent.  Continue Imodium and Lomotil as needed.   3.  Decreased performance status: Likely multifactorial.  Appreciate palliative care input.   4.  Nausea/vomiting: Patient does not complain of this today.  Continue Phenergan as needed. 5.  Pain: Patient does not complain of this today.  Appears well controlled with current narcotic regimen.  6.  PEG tube: Continue tube feeds as per dietary.  Appreciate surgery and dietary input.  Patient and her husband expressed understanding that they can call or return to clinic at any time if they have any questions, concerns, or complaints.  [name], MD 09/12/19 1:11 PM

## 2019-09-12 ENCOUNTER — Inpatient Hospital Stay: Payer: PPO | Attending: Oncology | Admitting: Oncology

## 2019-09-12 ENCOUNTER — Other Ambulatory Visit: Payer: Self-pay

## 2019-09-12 ENCOUNTER — Inpatient Hospital Stay: Payer: PPO

## 2019-09-12 ENCOUNTER — Inpatient Hospital Stay (HOSPITAL_BASED_OUTPATIENT_CLINIC_OR_DEPARTMENT_OTHER): Payer: PPO | Admitting: Hospice and Palliative Medicine

## 2019-09-12 VITALS — BP 114/75 | HR 96 | Temp 96.5°F | Resp 20 | Wt 116.0 lb

## 2019-09-12 DIAGNOSIS — Z9221 Personal history of antineoplastic chemotherapy: Secondary | ICD-10-CM | POA: Insufficient documentation

## 2019-09-12 DIAGNOSIS — I10 Essential (primary) hypertension: Secondary | ICD-10-CM | POA: Insufficient documentation

## 2019-09-12 DIAGNOSIS — Z85038 Personal history of other malignant neoplasm of large intestine: Secondary | ICD-10-CM | POA: Diagnosis not present

## 2019-09-12 DIAGNOSIS — Z808 Family history of malignant neoplasm of other organs or systems: Secondary | ICD-10-CM | POA: Insufficient documentation

## 2019-09-12 DIAGNOSIS — Z90710 Acquired absence of both cervix and uterus: Secondary | ICD-10-CM | POA: Diagnosis not present

## 2019-09-12 DIAGNOSIS — C760 Malignant neoplasm of head, face and neck: Secondary | ICD-10-CM

## 2019-09-12 DIAGNOSIS — Z87891 Personal history of nicotine dependence: Secondary | ICD-10-CM | POA: Insufficient documentation

## 2019-09-12 DIAGNOSIS — R197 Diarrhea, unspecified: Secondary | ICD-10-CM | POA: Diagnosis not present

## 2019-09-12 DIAGNOSIS — Z931 Gastrostomy status: Secondary | ICD-10-CM | POA: Diagnosis not present

## 2019-09-12 DIAGNOSIS — Z515 Encounter for palliative care: Secondary | ICD-10-CM | POA: Diagnosis not present

## 2019-09-12 DIAGNOSIS — C189 Malignant neoplasm of colon, unspecified: Secondary | ICD-10-CM | POA: Diagnosis not present

## 2019-09-12 NOTE — Progress Notes (Signed)
[facility]  [phone] [phone]) [phone]   Name: Dana Perez Date: 09/12/2019 MRN: [id number]  DOB: [birth year]  Patient Care Team: [name], MD as PCP - General (Physician Assistant) [name], MD as Referring Physician (Surgery)    REASON FOR CONSULTATION: Ms. 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 PMH also includes adenocarcinoma of the colon not currently on treatment, malnutrition status post PEG insertion, she had bilateral ankle fractures causing a marked reduction in her performance status, chronic pain on transdermal fentanyl, hypertension, hyperlipidemia, and CKD stage III. Patient had disease progression and general decline despite on previous treatment. Hospice had been discussed but patient/husband opted to continue treatment with nivolumab. Patient had good response to nivolumab.  Palliative care is now asked to follow and provide her with support.  SOCIAL HISTORY:     reports that she quit smoking about 2 years ago. Her smoking use included cigarettes. She smoked 0.25 packs per day. She has never used smokeless tobacco. She reports that she does not drink alcohol or use drugs.   Patient is married. She lives at home with her husband. She has no children. 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  ADVANCE DIRECTIVES:  Patient does not currently have advance directives. She would want her husband to be her decision-maker. Patient and husband have talked about completing a living will but have not currently done so.   CODE STATUS: DNR  PAST MEDICAL HISTORY: Past Medical History:  Diagnosis Date  . Acute hypoxemic respiratory failure ([place]) 11/15/2017  . Acute respiratory failure ([place])  11/15/2017  . Adenocarcinoma of colon ([place])   . Benign neoplasm of ascending colon   . CAP (community acquired pneumonia) 11/15/2017  . Essential hypertension 06/13/2014  . Goals of care, counseling/discussion 07/14/2017  . Healthcare-associated pneumonia   . HLD (hyperlipidemia) 06/13/2014  . Hypercholesteremia   . Hyperlipidemia   . Melena   . Multiple thyroid nodules 01/22/2012  . Osteoporosis   . Polycythemia, secondary 12/26/2014  . Pure hypercholesterolemia 03/15/2015  . Squamous cell carcinoma of head and neck ([place])   . Thyroid nodule     PAST SURGICAL HISTORY:  Past Surgical History:  Procedure Laterality Date  . ABDOMINAL HYSTERECTOMY    . COLONOSCOPY WITH PROPOFOL N/A 07/02/2017   Procedure: COLONOSCOPY WITH PROPOFOL;  Surgeon: Lucilla Lame, MD;  Location: [facility];  Service: Endoscopy;  Laterality: N/A;  . ESOPHAGOGASTRODUODENOSCOPY N/A 05/17/2019   Procedure: ESOPHAGOGASTRODUODENOSCOPY (EGD);  Surgeon: Lin Landsman, MD;  Location: [facility] ENDOSCOPY;  Service: Gastroenterology;  Laterality: N/A;  . ORIF ANKLE FRACTURE Left 08/08/2017   Procedure: OPEN REDUCTION INTERNAL FIXATION (ORIF) ANKLE FRACTURE;  Surgeon: Dereck Leep, MD;  Location: [facility];  Service: Orthopedics;  Laterality: Left;  . POLYPECTOMY N/A 07/02/2017   Procedure: POLYPECTOMY;  Surgeon: Lucilla Lame, MD;  Location: [facility];  Service: Endoscopy;  Laterality: N/A;  . WRIST FRACTURE SURGERY  08/2009   badly broken    HEMATOLOGY/ONCOLOGY HISTORY:  Oncology History  Squamous cell carcinoma of head and neck ([place])  07/01/2017 Initial Diagnosis   Squamous cell carcinoma of head and neck ([place])   04/08/2018 -  Chemotherapy   The patient had nivolumab (OPDIVO) 240 mg in sodium chloride 0.9 % 100 mL chemo infusion, 240 mg, Intravenous, Once, 16 of  20 cycles Administration: 240 mg (04/08/2018), 240 mg (04/22/2018), 240 mg (05/06/2018), 240 mg (05/20/2018), 240 mg (06/03/2018), 240 mg  (06/24/2018), 240 mg (07/08/2018), 240 mg (07/22/2018), 240 mg (08/05/2018), 240 mg (08/19/2018), 240 mg (09/02/2018), 240 mg (09/16/2018), 240 mg (09/30/2018), 240 mg (10/14/2018), 240 mg (10/28/2018), 240 mg (11/11/2018)  for chemotherapy treatment.      ALLERGIES:  is allergic to potassium; other; and tetanus antitoxin.  MEDICATIONS:  Current Outpatient Medications  Medication Sig Dispense Refill  . acetaminophen (TYLENOL) 500 MG tablet Take by mouth.    . calcium carbonate (OS-CAL) 600 MG TABS tablet Take by mouth.    . cholestyramine (QUESTRAN) 4 GM/DOSE powder Take 1 packet (4 g total) by mouth 3 (three) times daily with meals. (Patient taking differently: Place 4 g into feeding tube 3 (three) times daily with meals. ) 378 g 12  . clotrimazole-betamethasone (LOTRISONE) cream APPLY TO AFFECTED AREA TWICE A DAY    . Ensure Plus (ENSURE PLUS) LIQD Give 1 carton of ensure plus 5 times daily via feeding tube.  Flush with 60m of water before and after feeding. 1185 mL 0  . hydrOXYzine (ATARAX/VISTARIL) 10 MG tablet SMARTSIG:1 Tablet(s) By Mouth Every 12 Hours PRN    . lansoprazole (PREVACID SOLUTAB) 30 MG disintegrating tablet Place 1 tablet (30 mg total) into feeding tube 2 (two) times daily. (Patient not taking: Reported on 09/12/2019) 60 tablet 0  . mirtazapine (REMERON) 7.5 MG tablet Place 7.5 mg into feeding tube at bedtime.    .Marland Kitchenmorphine (ROXANOL) 20 MG/ML concentrated solution Place 0.25 mLs (5 mg total) into feeding tube every 4 (four) hours as needed for moderate pain or severe pain. 30 mL 0  . NARCAN 4 MG/0.1ML LIQD nasal spray kit USE 1 SPRAY PER INTRANASAL ROUTE AS DIRECTED  0  . Polyethylene Glycol 3350 (PEG 3350) 17 GM/SCOOP POWD Place 17 g into feeding tube 1 day or 1 dose.     . pravastatin (PRAVACHOL) 10 MG tablet Place 10 mg into feeding tube daily.     . prochlorperazine (COMPAZINE) 10 MG tablet Place 1 tablet (10 mg total) into feeding tube every 6 (six) hours as needed for nausea or  vomiting. 60 tablet 2  . XYLIMELTS 550 MG DISK TAKE 1 TABLET BY MOUTH EVERY 8 HOURS AS NEEDED FOR DRY MOUTH     No current facility-administered medications for this visit.   Facility-Administered Medications Ordered in Other Visits  Medication Dose Route Frequency Provider Last Rate Last Admin  . heparin lock flush 100 unit/mL  250 Units Intracatheter PRN PLeia Alf MD      . heparin lock flush 100 unit/mL  500 Units Intracatheter PRN PLeia Alf MD      . potassium chloride 20 mEq in sodium chloride 0.9 % 500 mL injection   Intravenous Once STye Savoy Doni H, RN      . sodium chloride 0.9 % injection 10 mL  10 mL Intracatheter PRN PLeia Alf MD      . sodium chloride 0.9 % injection 10 mL  10 mL Intracatheter PRN PLeia Alf MD      . sodium chloride 0.9 % injection 10 mL  10 mL Intracatheter PRN PMa Hillock Sandeep, MD      . sodium chloride 0.9 % injection 10 mL  10 mL Intracatheter PRN PLeia Alf MD        VITAL SIGNS: There were no vitals taken for this visit. There were no vitals filed for this visit.  Estimated  body mass index is 18.44 kg/m as calculated from the following:   Height as of 05/16/19: 5' 6.5" (1.689 m).   Weight as of an earlier encounter on 09/12/19: 116 lb (52.6 kg).  LABS: CBC:    Component Value Date/Time   WBC 7.1 06/07/2019 1018   HGB 14.5 06/07/2019 1018   HCT 44.5 06/07/2019 1018   PLT 265 06/07/2019 1018   MCV 94.1 06/07/2019 1018   NEUTROABS 5.0 06/07/2019 1018   LYMPHSABS 1.5 06/07/2019 1018   MONOABS 0.4 06/07/2019 1018   EOSABS 0.1 06/07/2019 1018   BASOSABS 0.0 06/07/2019 1018   Comprehensive Metabolic Panel:    Component Value Date/Time   NA 141 05/17/2019 0527   K 3.8 05/17/2019 0527   CL 114 (H) 05/17/2019 0527   CO2 21 (L) 05/17/2019 0527   BUN 29 (H) 05/17/2019 0527   CREATININE 0.86 06/07/2019 1018   GLUCOSE 91 05/17/2019 0527   CALCIUM 8.0 (L) 05/17/2019 0527   AST 32 05/16/2019 1257   ALT 25 05/16/2019  1257   ALKPHOS 91 05/16/2019 1257   BILITOT 0.5 05/16/2019 1257   PROT 6.7 05/16/2019 1257   ALBUMIN 3.7 05/16/2019 1257    RADIOGRAPHIC STUDIES: No results found.  PERFORMANCE STATUS (ECOG) : 3 - Symptomatic, >50% confined to bed  Review of Systems Unless otherwise noted, a complete review of systems is negative.  Physical Exam General: NAD, frail appearing, thin, in wheelchair Pulmonary: Unlabored Abdomen: soft, nontender, PEG noted but not visualized GU: no suprapubic tenderness Extremities: no edema, no joint deformities Skin: no rashes Neurological: Weakness but otherwise nonfocal  IMPRESSION: Routine follow-up visit today.  Patient was accompanied by her husband.  Overall, patient has been reports that she is doing about the same.  No significant changes today.  Husband says that her pruritus was improved with use of hydroxyzine.  Patient continues to use as needed morphine but overall says the pain is stable.  She continues to have dry mouth requiring frequent oral care.  Weight is stable.   Case and plan discussed with Dr. Grayland Ormond  PLAN: -Continue current scope of treatment -Continue morphine elixir 5 mg every 4 hours as needed for pain -RTC in 3 months   Patient expressed understanding and was in agreement with this plan. She also understands that She can call the clinic at any time with any questions, concerns, or complaints.     Time Total: 15 minutes  Visit consisted of counseling and education dealing with the complex and emotionally intense issues of symptom management and palliative care in the setting of serious and potentially life-threatening illness.Greater than 50%  of this time was spent counseling and coordinating care related to the above assessment and plan.  Signed by: Altha Harm, PhD, NP-C [phone] (Work Cell)

## 2019-09-12 NOTE — Progress Notes (Signed)
Nutrition Follow-up:  Patient with head and neck cancer stage IV and adenocarcinoma of colon.  Patient followed by Dr. Grayland Ormond and Palliative care.  Patient not currently receiving treatment.  Has PEG tube for nutrition  Missed patient in clinic this am.  Called patient's husband this afternoon.  Husband reports that patient continues to tolerate ensure plus 5 cartons per day (700, 1000, 1300, 1600, 1900). Continues with water flush of 30-83ml before and after feeding and at least 30-40ml water flush with medications (given 4 times per day).  Husband reports bowel movements go from constipation to diarrhea.    Reports adequate supply of enteral nutrition from [facility]  Medications: reviewed  Labs: reviewed  Anthropometrics:   Weight increased to 116 lb today from 111 lb in Nov 2020  Estimated Energy Needs  Kcals: 1250-1500 Protein: 62-75 g Fluid: 1.5 L  NUTRITION DIAGNOSIS: Inadequate oral intake but relying on feeding tube   INTERVENTION:  Continue ensure plus 5 carton per day.  Husband to continue to give at least 3 hours apart.   Provides 1750 calories, 65 g protein and 1251ml free water with formula and flush not including water with medications Husband has RD contact information    MONITORING, EVALUATION, GOAL: weight trends, TF tolerance   NEXT VISIT: April 26 th after MD appt  Matthew Pais B. Zenia Resides, [place], [place] Registered Dietitian [phone] (pager)

## 2019-09-13 DIAGNOSIS — C760 Malignant neoplasm of head, face and neck: Secondary | ICD-10-CM | POA: Diagnosis not present

## 2019-09-13 DIAGNOSIS — C189 Malignant neoplasm of colon, unspecified: Secondary | ICD-10-CM | POA: Diagnosis not present

## 2019-09-15 ENCOUNTER — Other Ambulatory Visit: Payer: Self-pay | Admitting: *Deleted

## 2019-09-15 MED ORDER — MORPHINE SULFATE (CONCENTRATE) 20 MG/ML PO SOLN: 5.0000 mg | ORAL | 0 refills | Status: DC | PRN

## 2019-10-13 DIAGNOSIS — C760 Malignant neoplasm of head, face and neck: Secondary | ICD-10-CM | POA: Diagnosis not present

## 2019-10-13 DIAGNOSIS — C189 Malignant neoplasm of colon, unspecified: Secondary | ICD-10-CM | POA: Diagnosis not present

## 2019-10-15 DIAGNOSIS — Z23 Encounter for immunization: Secondary | ICD-10-CM | POA: Diagnosis not present

## 2019-10-19 ENCOUNTER — Other Ambulatory Visit: Payer: Self-pay | Admitting: *Deleted

## 2019-10-19 MED ORDER — MORPHINE SULFATE (CONCENTRATE) 20 MG/ML PO SOLN: 5.0000 mg | ORAL | 0 refills | Status: DC | PRN

## 2019-10-20 DIAGNOSIS — K9420 Gastrostomy complication, unspecified: Secondary | ICD-10-CM | POA: Diagnosis not present

## 2019-10-28 ENCOUNTER — Other Ambulatory Visit: Payer: Self-pay | Admitting: *Deleted

## 2019-10-28 MED ORDER — PROCHLORPERAZINE MALEATE 10 MG PO TABS: 10.0000 mg | ORAL_TABLET | Freq: Four times a day (QID) | ORAL | 2 refills | Status: DC | PRN

## 2019-11-09 DIAGNOSIS — L299 Pruritus, unspecified: Secondary | ICD-10-CM | POA: Diagnosis not present

## 2019-11-09 DIAGNOSIS — R682 Dry mouth, unspecified: Secondary | ICD-10-CM | POA: Diagnosis not present

## 2019-11-09 DIAGNOSIS — C148 Malignant neoplasm of overlapping sites of lip, oral cavity and pharynx: Secondary | ICD-10-CM | POA: Diagnosis not present

## 2019-11-16 DIAGNOSIS — C189 Malignant neoplasm of colon, unspecified: Secondary | ICD-10-CM | POA: Diagnosis not present

## 2019-11-16 DIAGNOSIS — C760 Malignant neoplasm of head, face and neck: Secondary | ICD-10-CM | POA: Diagnosis not present

## 2019-11-18 ENCOUNTER — Other Ambulatory Visit: Payer: Self-pay | Admitting: *Deleted

## 2019-11-18 MED ORDER — MORPHINE SULFATE (CONCENTRATE) 10 MG /0.5 ML PO SOLN: 5.0000 mg | ORAL | 0 refills | Status: DC | PRN

## 2019-12-02 ENCOUNTER — Telehealth: Payer: Self-pay | Admitting: Emergency Medicine

## 2019-12-02 NOTE — Telephone Encounter (Signed)
Pt's husband called regarding lab and CT appointment scheduled for 4/21. Per husband, pt states that he talked with CT department today, and they told him that because he has to give the pt the oral contrast through her feeding tube, he should start giving it to her at 8:30 that morning. However, pt is scheduled for labs, and they need to leave the house around 8:30 that morning to get here for lab appt. CT suggested to husband that he may be able to bring the contrast here to [place] for [place] to administer prior to her CT. Please advise.

## 2019-12-02 NOTE — Telephone Encounter (Signed)
Spoke with pt's husband, sent schedule message to r/s lab appt to 4/20

## 2019-12-02 NOTE — Telephone Encounter (Signed)
She has had many CT scans and labs before this. How did we do it in the past?

## 2019-12-05 ENCOUNTER — Other Ambulatory Visit: Payer: Self-pay | Admitting: Emergency Medicine

## 2019-12-05 DIAGNOSIS — C760 Malignant neoplasm of head, face and neck: Secondary | ICD-10-CM

## 2019-12-06 ENCOUNTER — Other Ambulatory Visit: Payer: Self-pay

## 2019-12-06 ENCOUNTER — Inpatient Hospital Stay: Payer: PPO | Attending: Oncology

## 2019-12-06 DIAGNOSIS — C760 Malignant neoplasm of head, face and neck: Secondary | ICD-10-CM | POA: Insufficient documentation

## 2019-12-06 DIAGNOSIS — N183 Chronic kidney disease, stage 3 unspecified: Secondary | ICD-10-CM | POA: Insufficient documentation

## 2019-12-06 DIAGNOSIS — E46 Unspecified protein-calorie malnutrition: Secondary | ICD-10-CM | POA: Diagnosis not present

## 2019-12-06 DIAGNOSIS — I129 Hypertensive chronic kidney disease with stage 1 through stage 4 chronic kidney disease, or unspecified chronic kidney disease: Secondary | ICD-10-CM | POA: Insufficient documentation

## 2019-12-06 DIAGNOSIS — Z931 Gastrostomy status: Secondary | ICD-10-CM | POA: Diagnosis not present

## 2019-12-06 DIAGNOSIS — Z79899 Other long term (current) drug therapy: Secondary | ICD-10-CM | POA: Insufficient documentation

## 2019-12-06 DIAGNOSIS — Z87891 Personal history of nicotine dependence: Secondary | ICD-10-CM | POA: Diagnosis not present

## 2019-12-06 DIAGNOSIS — Z85038 Personal history of other malignant neoplasm of large intestine: Secondary | ICD-10-CM | POA: Diagnosis not present

## 2019-12-06 DIAGNOSIS — G8929 Other chronic pain: Secondary | ICD-10-CM | POA: Insufficient documentation

## 2019-12-06 DIAGNOSIS — E785 Hyperlipidemia, unspecified: Secondary | ICD-10-CM | POA: Diagnosis not present

## 2019-12-06 DIAGNOSIS — Z90710 Acquired absence of both cervix and uterus: Secondary | ICD-10-CM | POA: Diagnosis not present

## 2019-12-06 LAB — CBC WITH DIFFERENTIAL/PLATELET
Abs Immature Granulocytes: 0.03 10*3/uL (ref 0.00–0.07)
Basophils Absolute: 0 10*3/uL (ref 0.0–0.1)
Basophils Relative: 0 %
Eosinophils Absolute: 0.2 10*3/uL (ref 0.0–0.5)
Eosinophils Relative: 3 %
HCT: 46.1 % — ABNORMAL HIGH (ref 36.0–46.0)
Hemoglobin: 15.4 g/dL — ABNORMAL HIGH (ref 12.0–15.0)
Immature Granulocytes: 0 %
Lymphocytes Relative: 20 %
Lymphs Abs: 1.7 10*3/uL (ref 0.7–4.0)
MCH: 31.2 pg (ref 26.0–34.0)
MCHC: 33.4 g/dL (ref 30.0–36.0)
MCV: 93.5 fL (ref 80.0–100.0)
Monocytes Absolute: 0.6 10*3/uL (ref 0.1–1.0)
Monocytes Relative: 7 %
Neutro Abs: 6 10*3/uL (ref 1.7–7.7)
Neutrophils Relative %: 70 %
Platelets: 286 10*3/uL (ref 150–400)
RBC: 4.93 MIL/uL (ref 3.87–5.11)
RDW: 12.2 % (ref 11.5–15.5)
WBC: 8.6 10*3/uL (ref 4.0–10.5)
nRBC: 0 % (ref 0.0–0.2)

## 2019-12-06 LAB — BASIC METABOLIC PANEL
Anion gap: 10 (ref 5–15)
BUN: 36 mg/dL — ABNORMAL HIGH (ref 8–23)
CO2: 25 mmol/L (ref 22–32)
Calcium: 9.2 mg/dL (ref 8.9–10.3)
Chloride: 103 mmol/L (ref 98–111)
Creatinine, Ser: 0.92 mg/dL (ref 0.44–1.00)
GFR calc Af Amer: >60 mL/min (ref >60)
GFR calc non Af Amer: >60 mL/min (ref >60)
Glucose, Bld: 117 mg/dL — ABNORMAL HIGH (ref 70–99)
Potassium: 4.3 mmol/L (ref 3.5–5.1)
Sodium: 138 mmol/L (ref 135–145)

## 2019-12-07 ENCOUNTER — Inpatient Hospital Stay: Payer: PPO

## 2019-12-07 ENCOUNTER — Ambulatory Visit
Admission: RE | Admit: 2019-12-07 | Discharge: 2019-12-07 | Disposition: A | Discharge status: Home / Self Care | Payer: PPO | Source: Ambulatory Visit | Attending: Oncology | Admitting: Oncology

## 2019-12-07 DIAGNOSIS — C760 Malignant neoplasm of head, face and neck: Secondary | ICD-10-CM | POA: Insufficient documentation

## 2019-12-07 DIAGNOSIS — K7689 Other specified diseases of liver: Secondary | ICD-10-CM | POA: Diagnosis not present

## 2019-12-07 DIAGNOSIS — C4442 Squamous cell carcinoma of skin of scalp and neck: Secondary | ICD-10-CM | POA: Diagnosis not present

## 2019-12-07 DIAGNOSIS — C189 Malignant neoplasm of colon, unspecified: Secondary | ICD-10-CM | POA: Diagnosis not present

## 2019-12-07 DIAGNOSIS — J479 Bronchiectasis, uncomplicated: Secondary | ICD-10-CM | POA: Diagnosis not present

## 2019-12-07 DIAGNOSIS — J9809 Other diseases of bronchus, not elsewhere classified: Secondary | ICD-10-CM | POA: Diagnosis not present

## 2019-12-07 MED ORDER — IOHEXOL 300 MG/ML  SOLN
75.0000 mL | Freq: Once | INTRAMUSCULAR | Status: AC | PRN
  Administered 2019-12-07: 10:00:00 75 mL via INTRAVENOUS

## 2019-12-09 ENCOUNTER — Encounter: Payer: Self-pay | Admitting: Oncology

## 2019-12-09 ENCOUNTER — Other Ambulatory Visit: Payer: Self-pay

## 2019-12-09 NOTE — Progress Notes (Signed)
Patient prescreened for appointment with assistance of husband. Patient's husband has no concerns or questions.

## 2019-12-10 NOTE — Progress Notes (Signed)
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  INTERVAL HISTORY: Patient returns to clinic today for routine 93-monthevaluation and discussion of her imaging results.  She continues to feel well and appears at her baseline.  She has chronic weakness and fatigue.  Her husband answers most of the questions and he reports no new complaints.  She continues to require 100% of nutrition via her PEG tube. She has no neurologic complaints.  She denies any recent fevers or illnesses.  She denies any chest pain, shortness of breath, cough, or hemoptysis.  She has no nausea, vomiting, constipation, or diarrhea.  She has no urinary complaints.  Patient offers no further specific complaints today.  REVIEW OF SYSTEMS:   Review of Systems  Constitutional: Positive for malaise/fatigue. Negative for fever and weight loss.  HENT: Negative.  Negative for sore throat.   Respiratory: Negative.  Negative for cough, shortness of breath and stridor.   Cardiovascular: Negative.  Negative for chest pain and leg swelling.  Gastrointestinal: Negative.  Negative for abdominal pain, constipation, diarrhea, nausea and vomiting.  Genitourinary: Negative.  Negative for dysuria.  Musculoskeletal: Negative.  Negative for falls and joint pain.  Skin: Negative.  Negative for itching and rash.  Neurological: Positive for weakness. Negative for sensory change, focal weakness and headaches.  Psychiatric/Behavioral: Positive for memory loss. Negative for depression and hallucinations. The patient is not nervous/anxious  and does not have insomnia.     As per HPI. Otherwise, a complete review of systems is negative.  PAST MEDICAL HISTORY: Past Medical History:  Diagnosis Date  . Acute hypoxemic respiratory failure ([facility] 11/15/2017  . Acute respiratory failure ([facility] 11/15/2017  . [place] of [place]   . Benign neoplasm of ascending colon   . CAP (community acquired pneumonia) 11/15/2017  . Essential hypertension 06/13/2014  . Goals of care, counseling/discussion 07/14/2017  . Healthcare-associated pneumonia   . HLD (hyperlipidemia) 06/13/2014  . Hypercholesteremia   . Hyperlipidemia   . Melena   . Multiple thyroid nodules 01/22/2012  . Osteoporosis   . Polycythemia, secondary 12/26/2014  . Pure hypercholesterolemia 03/15/2015  . Squamous cell carcinoma of head and neck ([facility]   . Thyroid nodule     PAST SURGICAL HISTORY: Past Surgical History:  Procedure Laterality Date  . ABDOMINAL HYSTERECTOMY    . COLONOSCOPY WITH PROPOFOL N/A 07/02/2017   Procedure: COLONOSCOPY WITH PROPOFOL;  Surgeon: WLucilla Lame MD;  [place] [facility]  Service: Endoscopy;  Laterality: N/A;  . ESOPHAGOGASTRODUODENOSCOPY N/A 05/17/2019   Procedure: ESOPHAGOGASTRODUODENOSCOPY (EGD);  Surgeon: VLin Landsman MD;  [facility];  Service: Gastroenterology;  Laterality: N/A;  . ORIF ANKLE FRACTURE Left 08/08/2017   Procedure: OPEN REDUCTION INTERNAL FIXATION (ORIF) ANKLE FRACTURE;  Surgeon: HDereck Leep MD;  Location: [facility];  Service: Orthopedics;  Laterality: Left;  . POLYPECTOMY N/A 07/02/2017   Procedure: POLYPECTOMY;  Surgeon: WLucilla Lame MD;  [place] [facility]  Service: Endoscopy;  Laterality: N/A;  . WRIST FRACTURE SURGERY  08/2009   badly broken    FAMILY HISTORY Family History  Problem Relation Age of Onset  . Heart disease Mother   .  Diabetes Father        ADVANCED DIRECTIVES:    HEALTH MAINTENANCE: Social History   Tobacco Use  . Smoking status:  Former Smoker    Packs/day: 0.25    Types: Cigarettes    Quit date: 06/17/2017    Years since quitting: 2.4  . Smokeless tobacco: Never Used  Substance Use Topics  . Alcohol use: No  . Drug use: No     Allergies  Allergen Reactions  . Potassium Nausea Only and Nausea And Vomiting  . Other Other (See Comments) and Rash    TDAP tdap TDAP  . Tetanus Antitoxin Rash    Current Outpatient Medications  Medication Sig Dispense Refill  . acetaminophen (TYLENOL) 500 MG tablet Take by mouth.    . bacitracin ointment APPLY TO AFFECTED AREA TWICE A DAY    . calcium carbonate (OS-CAL) 600 MG TABS tablet Take by mouth.    . cholestyramine (QUESTRAN) 4 GM/DOSE powder Take 1 packet (4 g total) by mouth 3 (three) times daily with meals. (Patient taking differently: Place 4 g into feeding tube 3 (three) times daily with meals. ) 378 g 12  . clotrimazole-betamethasone (LOTRISONE) cream APPLY TO AFFECTED AREA TWICE A DAY    . Ensure Plus (ENSURE PLUS) LIQD Give 1 carton of ensure plus 5 times daily via feeding tube.  Flush with 30m of water before and after feeding. 1185 mL 0  . hydrOXYzine (ATARAX/VISTARIL) 10 MG tablet Place 10 mg into feeding tube 2 (two) times daily as needed.     . mirtazapine (REMERON) 7.5 MG tablet Place 15 mg into feeding tube at bedtime.    . Morphine Sulfate (MORPHINE CONCENTRATE) 10 mg / 0.5 ml concentrated solution Place 0.25 mLs (5 mg total) into feeding tube every 4 (four) hours as needed for moderate pain or severe pain. 30 mL 0  . NARCAN 4 MG/0.1ML LIQD nasal spray kit USE 1 SPRAY PER INTRANASAL ROUTE AS DIRECTED  0  . Polyethylene Glycol 3350 (PEG 3350) 17 GM/SCOOP POWD Place 17 g into feeding tube 1 day or 1 dose.     . pravastatin (PRAVACHOL) 10 MG tablet Place 10 mg into feeding tube daily.     . prochlorperazine (COMPAZINE) 10 MG tablet Place 1 tablet (10 mg total) into feeding tube every 6 (six) hours as needed for nausea or vomiting. 60 tablet 2   No current  facility-administered medications for this visit.   Facility-Administered Medications Ordered in Other Visits  Medication Dose Route Frequency Provider Last Rate Last Admin  . heparin lock flush 100 unit/mL  250 Units Intracatheter PRN PLeia Alf MD      . heparin lock flush 100 unit/mL  500 Units Intracatheter PRN PLeia Alf MD      . potassium chloride 20 mEq in sodium chloride 0.9 % 500 mL injection   Intravenous Once STye Savoy Doni H, RN      . sodium chloride 0.9 % injection 10 mL  10 mL Intracatheter PRN PLeia Alf MD      . sodium chloride 0.9 % injection 10 mL  10 mL Intracatheter PRN PLeia Alf MD      . sodium chloride 0.9 % injection 10 mL  10 mL Intracatheter PRN PMa Hillock Sandeep, MD      . sodium chloride 0.9 % injection 10 mL  10 mL Intracatheter PRN PLeia Alf MD      -year-old  OBJECTIVE: Vitals:   12/12/19 1116  BP: 111/80  Pulse: 91  Resp: 20  Temp: (!) 97.1 F (36.2 C)  SpO2: 95%     Body mass index is 18.57 kg/m.    ECOG FS:2 - Symptomatic, <50% confined to bed  General: Thin, no acute distress.  Sitting in a wheelchair. Eyes: Pink conjunctiva, anicteric sclera. HEENT: Normocephalic, moist mucous membranes.  Significant surgical changes noted.  No palpable lymphadenopathy. Lungs: No audible wheezing or coughing. Heart: Regular rate and rhythm. Abdomen: Soft, nontender, no obvious distention.  PEG tube noted. Musculoskeletal: No edema, cyanosis, or clubbing. Neuro: Alert, answering all questions appropriately. Cranial nerves grossly intact. Skin: No rashes or petechiae noted. Psych: Normal affect.  LAB RESULTS:  Lab Results  Component Value Date   NA 138 12/06/2019   K 4.3 12/06/2019   CL 103 12/06/2019   CO2 25 12/06/2019   GLUCOSE 117 (H) 12/06/2019   BUN 36 (H) 12/06/2019   CREATININE 0.92 12/06/2019   CALCIUM 9.2 12/06/2019   PROT 6.7 05/16/2019   ALBUMIN 3.7 05/16/2019   AST 32 05/16/2019   ALT 25 05/16/2019    ALKPHOS 91 05/16/2019   BILITOT 0.5 05/16/2019   GFRNONAA >60 12/06/2019   GFRAA >60 12/06/2019    Lab Results  Component Value Date   WBC 8.6 12/06/2019   NEUTROABS 6.0 12/06/2019   HGB 15.4 (H) 12/06/2019   HCT 46.1 (H) 12/06/2019   MCV 93.5 12/06/2019   PLT 286 12/06/2019     STUDIES: CT SOFT TISSUE NECK W CONTRAST  Result Date: 12/07/2019 CLINICAL DATA:  Squamous cell carcinoma of head and neck. Squamous cell carcinoma head/neck follow-up. EXAM: CT NECK WITH CONTRAST TECHNIQUE: Multidetector CT imaging of the neck was performed using the standard protocol following the bolus administration of intravenous contrast. CONTRAST:  35m OMNIPAQUE IOHEXOL 300 MG/ML  SOLN COMPARISON:  Prior neck CT examinations 03/07/2019 and earlier FINDINGS: Evaluation is limited by moderate motion degradation at the level of the mid to upper neck. Pharynx and larynx: Redemonstrated postsurgical changes to the oral cavity and right neck, including prior segmental right mandibulectomy. No discrete mass is identified within the oral cavity, pharynx or larynx. Redemonstrated pharyngeal soft tissue post radiation changes. Salivary glands: The right submandibular gland is surgically absent. The bilateral parotid and left submandibular glands are atrophic. Thyroid: Unremarkable Lymph nodes: No pathologically enlarged cervical chain lymph nodes are identified. Vascular: Redemonstrated prominent atherosclerotic plaque within the visualized aortic arch, proximal major branch vessels of the neck and within both carotid and vertebral arteries. Limited intracranial: No abnormality identified at the visualized levels. Visualized orbits: Incompletely imaged. Visualized orbits demonstrate no acute abnormality. Mastoids and visualized paranasal sinuses: Small air-fluid level within the right sphenoid sinus. No significant mastoid effusion. Skeleton: Prior segmental right mandibulectomy. No acute bony abnormality or aggressive  osseous lesion. Reversal of the expected cervical lordosis with cervical spondylosis. Redemonstrated mild, chronic T1 superior endplate deformity. Upper chest: Reported separately. IMPRESSION: 1. Examination limited by moderate motion degradation at the level of the mid to upper neck. 2. No recurrent tumor or adenopathy is identified. 3. Right sphenoid sinusitis. 4. Please refer to CT chest separately reported. Electronically Signed   By: KKellie SimmeringDO   On: 12/07/2019 16:51   CT Chest W Contrast  Result Date: 12/07/2019 CLINICAL DATA:  79year old female with history of adenocarcinoma of the colon and head neck squamous cell carcinoma. EXAM: CT CHEST, ABDOMEN, AND PELVIS WITH CONTRAST TECHNIQUE: Multidetector CT imaging of the chest, abdomen and pelvis was performed following the standard protocol during bolus  administration of intravenous contrast. CONTRAST:  56m OMNIPAQUE IOHEXOL 300 MG/ML  SOLN COMPARISON:  CT the chest, abdomen and pelvis 06/07/2019. FINDINGS: CT CHEST FINDINGS Cardiovascular: Heart size is normal. Small amount of pericardial fluid, increased compared to the prior study. No pericardial calcification. There is aortic atherosclerosis, as well as atherosclerosis of the great vessels of the mediastinum and the coronary arteries, including calcified atherosclerotic plaque in the left main, left anterior descending, left circumflex and right coronary arteries. Mediastinum/Nodes: No pathologically enlarged mediastinal or hilar lymph nodes. Esophagus is unremarkable in appearance. No axillary lymphadenopathy. Lungs/Pleura: Focal area of nodular architectural distortion in the right upper lobe (axial image 32 of series 6 and coronal image 77 of series 7), similar to prior examinations, most compatible with an area of post infectious or inflammatory scarring. There is also some clustered micronodularity in a peribronchovascular distribution in the right middle lobe where there is mucoid impaction.  No other larger more suspicious appearing pulmonary nodules or masses are noted. No acute consolidative airspace disease. No pleural effusions. Scattered areas of cylindrical bronchiectasis. Diffuse bronchial wall thickening with moderate centrilobular and paraseptal emphysema. Musculoskeletal: There are no aggressive appearing lytic or blastic lesions noted in the visualized portions of the skeleton. CT ABDOMEN PELVIS FINDINGS Hepatobiliary: Subcentimeter low-attenuation lesion in segment 4A of the liver, too small to characterize, but statistically likely to represent a tiny cyst. No other larger more suspicious appearing hepatic lesions are noted. No intra or extrahepatic biliary ductal dilatation. Gallbladder is normal in appearance. Pancreas: No pancreatic mass. No pancreatic ductal dilatation. No pancreatic or peripancreatic fluid collections or inflammatory changes. Spleen: Unremarkable. Adrenals/Urinary Tract: Bilateral kidneys and adrenal glands are normal in appearance. No hydroureteronephrosis in the visualized portions of the abdomen. Urinary bladder is normal in appearance. Stomach/Bowel: Percutaneous gastrostomy tube with retention balloon in the distal body of the stomach. Stomach is otherwise unremarkable in appearance. No pathologic dilatation of small bowel or colon. The appendix is not confidently identified and may be surgically absent. Regardless, there are no inflammatory changes noted adjacent to the cecum to suggest the presence of an acute appendicitis at this time. Vascular/Lymphatic: Aortic atherosclerosis, without evidence of aneurysm or dissection in the abdominal or pelvic vasculature. No lymphadenopathy noted in the abdomen or pelvis. Reproductive: Status post hysterectomy. Ovaries are not confidently identified may be surgically absent or atrophic. Other: No significant volume of ascites.  No pneumoperitoneum. Musculoskeletal: There are no aggressive appearing lytic or blastic lesions  noted in the visualized portions of the skeleton. IMPRESSION: 1. No definitive findings to suggest metastatic disease in the chest, abdomen or pelvis. 2. Stable appearance of nodular area of architectural distortion in the right upper lobe compared to prior examinations. This is favored to represent an area of post infectious or inflammatory scarring, but could represent a treated metastatic lesion. 3. Diffuse bronchial wall thickening with moderate centrilobular and paraseptal emphysema; imaging findings suggestive of underlying COPD. 4. Scattered areas of cylindrical bronchiectasis and mucoid impaction in the lungs, as above. 5. Small amount of pericardial fluid, increased compared to the prior study. 6. Aortic atherosclerosis, in addition to left main and 3 vessel coronary artery disease. Assessment for potential risk factor modification, dietary therapy or pharmacologic therapy may be warranted, if clinically indicated. 7. Additional incidental findings, as above. Electronically Signed   By: DVinnie LangtonM.D.   On: 12/07/2019 14:23   CT Abdomen Pelvis W Contrast  Result Date: 12/07/2019 CLINICAL DATA:  79year old female with history of adenocarcinoma of  the colon and head neck squamous cell carcinoma. EXAM: CT CHEST, ABDOMEN, AND PELVIS WITH CONTRAST TECHNIQUE: Multidetector CT imaging of the chest, abdomen and pelvis was performed following the standard protocol during bolus administration of intravenous contrast. CONTRAST:  70m OMNIPAQUE IOHEXOL 300 MG/ML  SOLN COMPARISON:  CT the chest, abdomen and pelvis 06/07/2019. FINDINGS: CT CHEST FINDINGS Cardiovascular: Heart size is normal. Small amount of pericardial fluid, increased compared to the prior study. No pericardial calcification. There is aortic atherosclerosis, as well as atherosclerosis of the great vessels of the mediastinum and the coronary arteries, including calcified atherosclerotic plaque in the left main, left anterior descending, left  circumflex and right coronary arteries. Mediastinum/Nodes: No pathologically enlarged mediastinal or hilar lymph nodes. Esophagus is unremarkable in appearance. No axillary lymphadenopathy. Lungs/Pleura: Focal area of nodular architectural distortion in the right upper lobe (axial image 32 of series 6 and coronal image 77 of series 7), similar to prior examinations, most compatible with an area of post infectious or inflammatory scarring. There is also some clustered micronodularity in a peribronchovascular distribution in the right middle lobe where there is mucoid impaction. No other larger more suspicious appearing pulmonary nodules or masses are noted. No acute consolidative airspace disease. No pleural effusions. Scattered areas of cylindrical bronchiectasis. Diffuse bronchial wall thickening with moderate centrilobular and paraseptal emphysema. Musculoskeletal: There are no aggressive appearing lytic or blastic lesions noted in the visualized portions of the skeleton. CT ABDOMEN PELVIS FINDINGS Hepatobiliary: Subcentimeter low-attenuation lesion in segment 4A of the liver, too small to characterize, but statistically likely to represent a tiny cyst. No other larger more suspicious appearing hepatic lesions are noted. No intra or extrahepatic biliary ductal dilatation. Gallbladder is normal in appearance. Pancreas: No pancreatic mass. No pancreatic ductal dilatation. No pancreatic or peripancreatic fluid collections or inflammatory changes. Spleen: Unremarkable. Adrenals/Urinary Tract: Bilateral kidneys and adrenal glands are normal in appearance. No hydroureteronephrosis in the visualized portions of the abdomen. Urinary bladder is normal in appearance. Stomach/Bowel: Percutaneous gastrostomy tube with retention balloon in the distal body of the stomach. Stomach is otherwise unremarkable in appearance. No pathologic dilatation of small bowel or colon. The appendix is not confidently identified and may be  surgically absent. Regardless, there are no inflammatory changes noted adjacent to the cecum to suggest the presence of an acute appendicitis at this time. Vascular/Lymphatic: Aortic atherosclerosis, without evidence of aneurysm or dissection in the abdominal or pelvic vasculature. No lymphadenopathy noted in the abdomen or pelvis. Reproductive: Status post hysterectomy. Ovaries are not confidently identified may be surgically absent or atrophic. Other: No significant volume of ascites.  No pneumoperitoneum. Musculoskeletal: There are no aggressive appearing lytic or blastic lesions noted in the visualized portions of the skeleton. IMPRESSION: 1. No definitive findings to suggest metastatic disease in the chest, abdomen or pelvis. 2. Stable appearance of nodular area of architectural distortion in the right upper lobe compared to prior examinations. This is favored to represent an area of post infectious or inflammatory scarring, but could represent a treated metastatic lesion. 3. Diffuse bronchial wall thickening with moderate centrilobular and paraseptal emphysema; imaging findings suggestive of underlying COPD. 4. Scattered areas of cylindrical bronchiectasis and mucoid impaction in the lungs, as above. 5. Small amount of pericardial fluid, increased compared to the prior study. 6. Aortic atherosclerosis, in addition to left main and 3 vessel coronary artery disease. Assessment for potential risk factor modification, dietary therapy or pharmacologic therapy may be warranted, if clinically indicated. 7. Additional incidental findings, as above. Electronically  Signed   By: Vinnie Langton M.D.   On: 12/07/2019 14:23    ASSESSMENT: Recurrent, progressive stage IVa head and neck squamous cell carcinoma, adenocarcinoma of the colon.  PLAN:    1.  Recurrent, progressive stage IVa head and neck squamous cell carcinoma: Patient's last infusion of nivolumab was on November 11, 2018.  CT scan results from December 07, 2019 reviewed independently and report as above with no obvious evidence of recurrent or progressive disease.  Continue follow-up with ENT as indicated.  Her performance status remains decreased and unchanged.  No intervention is needed at this time.  Return to clinic in 3 months with laboratory work and further evaluation.  Will reimage in 6 months.  Appreciate palliative care input. 2.  Diarrhea: Patient does not complain of this today.  Intermittent.  Continue Imodium and Lomotil as needed.   3.  Decreased performance status: Likely multifactorial.  Appreciate palliative care input.   4.  Nausea/vomiting: Patient does not complain of this today.  Continue Phenergan as needed. 5.  Pain: Patient does not complain of this today.  Appears well controlled with current narcotic regimen.  6.  PEG tube: Continue tube feeds as per dietary.  Appreciate surgery and dietary input.  Patient and her husband expressed understanding that they can call or return to clinic at any time if they have any questions, concerns, or complaints.  [name], MD 12/12/19 2:00 PM

## 2019-12-12 ENCOUNTER — Inpatient Hospital Stay: Payer: PPO

## 2019-12-12 ENCOUNTER — Inpatient Hospital Stay (HOSPITAL_BASED_OUTPATIENT_CLINIC_OR_DEPARTMENT_OTHER): Payer: PPO | Admitting: Hospice and Palliative Medicine

## 2019-12-12 ENCOUNTER — Encounter: Payer: Self-pay | Admitting: Oncology

## 2019-12-12 ENCOUNTER — Inpatient Hospital Stay (HOSPITAL_BASED_OUTPATIENT_CLINIC_OR_DEPARTMENT_OTHER): Payer: PPO | Admitting: Oncology

## 2019-12-12 ENCOUNTER — Other Ambulatory Visit: Payer: Self-pay

## 2019-12-12 VITALS — BP 111/80 | HR 91 | Temp 97.1°F | Resp 20 | Wt 116.8 lb

## 2019-12-12 DIAGNOSIS — C760 Malignant neoplasm of head, face and neck: Secondary | ICD-10-CM

## 2019-12-12 DIAGNOSIS — Z515 Encounter for palliative care: Secondary | ICD-10-CM

## 2019-12-12 DIAGNOSIS — G893 Neoplasm related pain (acute) (chronic): Secondary | ICD-10-CM | POA: Diagnosis not present

## 2019-12-12 DIAGNOSIS — F329 Major depressive disorder, single episode, unspecified: Secondary | ICD-10-CM

## 2019-12-12 DIAGNOSIS — F32A Depression, unspecified: Secondary | ICD-10-CM

## 2019-12-12 NOTE — Progress Notes (Signed)
Patient husband would like to discuss ways to make patient more comfortable.

## 2019-12-12 NOTE — Progress Notes (Signed)
[place]  Telephone:([phone] [phone]   Name: Dana Perez Date: 12/12/2019 MRN: [id number]  DOB: September 13, 1940  Patient Care Team: [name], MD as PCP - General (Physician Assistant) [name], MD as Referring Physician (Surgery) [name], MD as Consulting Physician (Oncology)    REASON FOR CONSULTATION: Ms. 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 PMH also includes adenocarcinoma of the colon not currently on treatment, malnutrition status post PEG insertion, she had bilateral ankle fractures causing a marked reduction in her performance status, chronic pain on transdermal fentanyl, hypertension, hyperlipidemia, and CKD stage III. Patient had disease progression and general decline despite on previous treatment. Hospice had been discussed but patient/husband opted to continue treatment with nivolumab. Patient had good response to nivolumab.  Palliative care is now asked to follow and provide her with support.  SOCIAL HISTORY:     reports that she quit smoking about 2 years ago. Her smoking use included cigarettes. She smoked 0.25 packs per day. She has never used smokeless tobacco. She reports that she does not drink alcohol or use drugs.   Patient is married. She lives at home with her husband. She has no children. 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  ADVANCE DIRECTIVES:  Patient does not currently have advance directives. She would want her husband to be her decision-maker. Patient and husband have talked about completing a living will but have not currently done so.   CODE STATUS: DNR  PAST MEDICAL HISTORY: Past Medical History:  Diagnosis Date  . Acute hypoxemic respiratory failure  ([place]) 11/15/2017  . Acute respiratory failure ([place]) 11/15/2017  . Adenocarcinoma of colon ([place])   . Benign neoplasm of ascending colon   . CAP (community acquired pneumonia) 11/15/2017  . Essential hypertension 06/13/2014  . Goals of care, counseling/discussion 07/14/2017  . Healthcare-associated pneumonia   . HLD (hyperlipidemia) 06/13/2014  . Hypercholesteremia   . Hyperlipidemia   . Melena   . Multiple thyroid nodules 01/22/2012  . Osteoporosis   . Polycythemia, secondary 12/26/2014  . Pure hypercholesterolemia 03/15/2015  . Squamous cell carcinoma of head and neck ([place])   . Thyroid nodule     PAST SURGICAL HISTORY:  Past Surgical History:  Procedure Laterality Date  . ABDOMINAL HYSTERECTOMY    . COLONOSCOPY WITH PROPOFOL N/A 07/02/2017   Procedure: COLONOSCOPY WITH PROPOFOL;  Surgeon: Lucilla Lame, MD;  Location: [facility];  Service: Endoscopy;  Laterality: N/A;  . ESOPHAGOGASTRODUODENOSCOPY N/A 05/17/2019   Procedure: ESOPHAGOGASTRODUODENOSCOPY (EGD);  Surgeon: Lin Landsman, MD;  Location: [facility] ENDOSCOPY;  Service: Gastroenterology;  Laterality: N/A;  . ORIF ANKLE FRACTURE Left 08/08/2017   Procedure: OPEN REDUCTION INTERNAL FIXATION (ORIF) ANKLE FRACTURE;  Surgeon: Dereck Leep, MD;  Location: [facility];  Service: Orthopedics;  Laterality: Left;  . POLYPECTOMY N/A 07/02/2017   Procedure: POLYPECTOMY;  Surgeon: Lucilla Lame, MD;  Location: [facility];  Service: Endoscopy;  Laterality: N/A;  . WRIST FRACTURE SURGERY  08/2009   badly broken    HEMATOLOGY/ONCOLOGY HISTORY:  Oncology History  Squamous cell carcinoma of head and neck ([place])  07/01/2017 Initial Diagnosis   Squamous cell carcinoma of head and neck ([place])   04/08/2018 -  Chemotherapy   The patient had nivolumab (OPDIVO) 240 mg in sodium chloride 0.9 % 100 mL  chemo infusion, 240 mg, Intravenous, Once, 16 of 20 cycles Administration: 240 mg (04/08/2018), 240 mg (04/22/2018), 240 mg (05/06/2018), 240  mg (05/20/2018), 240 mg (06/03/2018), 240 mg (06/24/2018), 240 mg (07/08/2018), 240 mg (07/22/2018), 240 mg (08/05/2018), 240 mg (08/19/2018), 240 mg (09/02/2018), 240 mg (09/16/2018), 240 mg (09/30/2018), 240 mg (10/14/2018), 240 mg (10/28/2018), 240 mg (11/11/2018)  for chemotherapy treatment.      ALLERGIES:  is allergic to potassium; other; and tetanus antitoxin.  MEDICATIONS:  Current Outpatient Medications  Medication Sig Dispense Refill  . acetaminophen (TYLENOL) 500 MG tablet Take by mouth.    . bacitracin ointment APPLY TO AFFECTED AREA TWICE A DAY    . calcium carbonate (OS-CAL) 600 MG TABS tablet Take by mouth.    . cholestyramine (QUESTRAN) 4 GM/DOSE powder Take 1 packet (4 g total) by mouth 3 (three) times daily with meals. (Patient taking differently: Place 4 g into feeding tube 3 (three) times daily with meals. ) 378 g 12  . clotrimazole-betamethasone (LOTRISONE) cream APPLY TO AFFECTED AREA TWICE A DAY    . Ensure Plus (ENSURE PLUS) LIQD Give 1 carton of ensure plus 5 times daily via feeding tube.  Flush with 16m of water before and after feeding. 1185 mL 0  . hydrOXYzine (ATARAX/VISTARIL) 10 MG tablet Place 10 mg into feeding tube 2 (two) times daily as needed.     . mirtazapine (REMERON) 7.5 MG tablet Place 15 mg into feeding tube at bedtime.    . Morphine Sulfate (MORPHINE CONCENTRATE) 10 mg / 0.5 ml concentrated solution Place 0.25 mLs (5 mg total) into feeding tube every 4 (four) hours as needed for moderate pain or severe pain. 30 mL 0  . NARCAN 4 MG/0.1ML LIQD nasal spray kit USE 1 SPRAY PER INTRANASAL ROUTE AS DIRECTED  0  . Polyethylene Glycol 3350 (PEG 3350) 17 GM/SCOOP POWD Place 17 g into feeding tube 1 day or 1 dose.     . pravastatin (PRAVACHOL) 10 MG tablet Place 10 mg into feeding tube daily.     . prochlorperazine (COMPAZINE) 10 MG tablet Place 1 tablet (10 mg total) into feeding tube every 6 (six) hours as needed for nausea or vomiting. 60 tablet 2   No current  facility-administered medications for this visit.   Facility-Administered Medications Ordered in Other Visits  Medication Dose Route Frequency Provider Last Rate Last Admin  . heparin lock flush 100 unit/mL  250 Units Intracatheter PRN PLeia Alf MD      . heparin lock flush 100 unit/mL  500 Units Intracatheter PRN PLeia Alf MD      . potassium chloride 20 mEq in sodium chloride 0.9 % 500 mL injection   Intravenous Once STye Savoy Doni H, RN      . sodium chloride 0.9 % injection 10 mL  10 mL Intracatheter PRN PLeia Alf MD      . sodium chloride 0.9 % injection 10 mL  10 mL Intracatheter PRN PLeia Alf MD      . sodium chloride 0.9 % injection 10 mL  10 mL Intracatheter PRN PMa Hillock Sandeep, MD      . sodium chloride 0.9 % injection 10 mL  10 mL Intracatheter PRN PLeia Alf MD        VITAL SIGNS: There were no vitals taken for this visit. There were no vitals filed for this visit.  Estimated body mass index is 18.57 kg/m as calculated from the following:   Height as of 05/16/19: 5' 6.5" (1.689 m).  Weight as of an earlier encounter on 12/12/19: 116 lb 12.8 oz (53 kg).  LABS: CBC:    Component Value Date/Time   WBC 8.6 12/06/2019 1053   HGB 15.4 (H) 12/06/2019 1053   HCT 46.1 (H) 12/06/2019 1053   PLT 286 12/06/2019 1053   MCV 93.5 12/06/2019 1053   NEUTROABS 6.0 12/06/2019 1053   LYMPHSABS 1.7 12/06/2019 1053   MONOABS 0.6 12/06/2019 1053   EOSABS 0.2 12/06/2019 1053   BASOSABS 0.0 12/06/2019 1053   Comprehensive Metabolic Panel:    Component Value Date/Time   NA 138 12/06/2019 1053   K 4.3 12/06/2019 1053   CL 103 12/06/2019 1053   CO2 25 12/06/2019 1053   BUN 36 (H) 12/06/2019 1053   CREATININE 0.92 12/06/2019 1053   GLUCOSE 117 (H) 12/06/2019 1053   CALCIUM 9.2 12/06/2019 1053   AST 32 05/16/2019 1257   ALT 25 05/16/2019 1257   ALKPHOS 91 05/16/2019 1257   BILITOT 0.5 05/16/2019 1257   PROT 6.7 05/16/2019 1257   ALBUMIN 3.7  05/16/2019 1257    RADIOGRAPHIC STUDIES: CT SOFT TISSUE NECK W CONTRAST  Result Date: 12/07/2019 CLINICAL DATA:  Squamous cell carcinoma of head and neck. Squamous cell carcinoma head/neck follow-up. EXAM: CT NECK WITH CONTRAST TECHNIQUE: Multidetector CT imaging of the neck was performed using the standard protocol following the bolus administration of intravenous contrast. CONTRAST:  31m OMNIPAQUE IOHEXOL 300 MG/ML  SOLN COMPARISON:  Prior neck CT examinations 03/07/2019 and earlier FINDINGS: Evaluation is limited by moderate motion degradation at the level of the mid to upper neck. Pharynx and larynx: Redemonstrated postsurgical changes to the oral cavity and right neck, including prior segmental right mandibulectomy. No discrete mass is identified within the oral cavity, pharynx or larynx. Redemonstrated pharyngeal soft tissue post radiation changes. Salivary glands: The right submandibular gland is surgically absent. The bilateral parotid and left submandibular glands are atrophic. Thyroid: Unremarkable Lymph nodes: No pathologically enlarged cervical chain lymph nodes are identified. Vascular: Redemonstrated prominent atherosclerotic plaque within the visualized aortic arch, proximal major branch vessels of the neck and within both carotid and vertebral arteries. Limited intracranial: No abnormality identified at the visualized levels. Visualized orbits: Incompletely imaged. Visualized orbits demonstrate no acute abnormality. Mastoids and visualized paranasal sinuses: Small air-fluid level within the right sphenoid sinus. No significant mastoid effusion. Skeleton: Prior segmental right mandibulectomy. No acute bony abnormality or aggressive osseous lesion. Reversal of the expected cervical lordosis with cervical spondylosis. Redemonstrated mild, chronic T1 superior endplate deformity. Upper chest: Reported separately. IMPRESSION: 1. Examination limited by moderate motion degradation at the level of the  mid to upper neck. 2. No recurrent tumor or adenopathy is identified. 3. Right sphenoid sinusitis. 4. Please refer to CT chest separately reported. Electronically Signed   By: KKellie SimmeringDO   On: 12/07/2019 16:51   CT Chest W Contrast  Result Date: 12/07/2019 CLINICAL DATA:  79year old female with history of adenocarcinoma of the colon and head neck squamous cell carcinoma. EXAM: CT CHEST, ABDOMEN, AND PELVIS WITH CONTRAST TECHNIQUE: Multidetector CT imaging of the chest, abdomen and pelvis was performed following the standard protocol during bolus administration of intravenous contrast. CONTRAST:  746mOMNIPAQUE IOHEXOL 300 MG/ML  SOLN COMPARISON:  CT the chest, abdomen and pelvis 06/07/2019. FINDINGS: CT CHEST FINDINGS Cardiovascular: Heart size is normal. Small amount of pericardial fluid, increased compared to the prior study. No pericardial calcification. There is aortic atherosclerosis, as well as atherosclerosis of the great vessels of the mediastinum  and the coronary arteries, including calcified atherosclerotic plaque in the left main, left anterior descending, left circumflex and right coronary arteries. Mediastinum/Nodes: No pathologically enlarged mediastinal or hilar lymph nodes. Esophagus is unremarkable in appearance. No axillary lymphadenopathy. Lungs/Pleura: Focal area of nodular architectural distortion in the right upper lobe (axial image 32 of series 6 and coronal image 77 of series 7), similar to prior examinations, most compatible with an area of post infectious or inflammatory scarring. There is also some clustered micronodularity in a peribronchovascular distribution in the right middle lobe where there is mucoid impaction. No other larger more suspicious appearing pulmonary nodules or masses are noted. No acute consolidative airspace disease. No pleural effusions. Scattered areas of cylindrical bronchiectasis. Diffuse bronchial wall thickening with moderate centrilobular and  paraseptal emphysema. Musculoskeletal: There are no aggressive appearing lytic or blastic lesions noted in the visualized portions of the skeleton. CT ABDOMEN PELVIS FINDINGS Hepatobiliary: Subcentimeter low-attenuation lesion in segment 4A of the liver, too small to characterize, but statistically likely to represent a tiny cyst. No other larger more suspicious appearing hepatic lesions are noted. No intra or extrahepatic biliary ductal dilatation. Gallbladder is normal in appearance. Pancreas: No pancreatic mass. No pancreatic ductal dilatation. No pancreatic or peripancreatic fluid collections or inflammatory changes. Spleen: Unremarkable. Adrenals/Urinary Tract: Bilateral kidneys and adrenal glands are normal in appearance. No hydroureteronephrosis in the visualized portions of the abdomen. Urinary bladder is normal in appearance. Stomach/Bowel: Percutaneous gastrostomy tube with retention balloon in the distal body of the stomach. Stomach is otherwise unremarkable in appearance. No pathologic dilatation of small bowel or colon. The appendix is not confidently identified and may be surgically absent. Regardless, there are no inflammatory changes noted adjacent to the cecum to suggest the presence of an acute appendicitis at this time. Vascular/Lymphatic: Aortic atherosclerosis, without evidence of aneurysm or dissection in the abdominal or pelvic vasculature. No lymphadenopathy noted in the abdomen or pelvis. Reproductive: Status post hysterectomy. Ovaries are not confidently identified may be surgically absent or atrophic. Other: No significant volume of ascites.  No pneumoperitoneum. Musculoskeletal: There are no aggressive appearing lytic or blastic lesions noted in the visualized portions of the skeleton. IMPRESSION: 1. No definitive findings to suggest metastatic disease in the chest, abdomen or pelvis. 2. Stable appearance of nodular area of architectural distortion in the right upper lobe compared to  prior examinations. This is favored to represent an area of post infectious or inflammatory scarring, but could represent a treated metastatic lesion. 3. Diffuse bronchial wall thickening with moderate centrilobular and paraseptal emphysema; imaging findings suggestive of underlying COPD. 4. Scattered areas of cylindrical bronchiectasis and mucoid impaction in the lungs, as above. 5. Small amount of pericardial fluid, increased compared to the prior study. 6. Aortic atherosclerosis, in addition to left main and 3 vessel coronary artery disease. Assessment for potential risk factor modification, dietary therapy or pharmacologic therapy may be warranted, if clinically indicated. 7. Additional incidental findings, as above. Electronically Signed   By: Vinnie Langton M.D.   On: 12/07/2019 14:23   CT Abdomen Pelvis W Contrast  Result Date: 12/07/2019 CLINICAL DATA:  79 year old female with history of adenocarcinoma of the colon and head neck squamous cell carcinoma. EXAM: CT CHEST, ABDOMEN, AND PELVIS WITH CONTRAST TECHNIQUE: Multidetector CT imaging of the chest, abdomen and pelvis was performed following the standard protocol during bolus administration of intravenous contrast. CONTRAST:  81m OMNIPAQUE IOHEXOL 300 MG/ML  SOLN COMPARISON:  CT the chest, abdomen and pelvis 06/07/2019. FINDINGS: CT CHEST FINDINGS  Cardiovascular: Heart size is normal. Small amount of pericardial fluid, increased compared to the prior study. No pericardial calcification. There is aortic atherosclerosis, as well as atherosclerosis of the great vessels of the mediastinum and the coronary arteries, including calcified atherosclerotic plaque in the left main, left anterior descending, left circumflex and right coronary arteries. Mediastinum/Nodes: No pathologically enlarged mediastinal or hilar lymph nodes. Esophagus is unremarkable in appearance. No axillary lymphadenopathy. Lungs/Pleura: Focal area of nodular architectural distortion  in the right upper lobe (axial image 32 of series 6 and coronal image 77 of series 7), similar to prior examinations, most compatible with an area of post infectious or inflammatory scarring. There is also some clustered micronodularity in a peribronchovascular distribution in the right middle lobe where there is mucoid impaction. No other larger more suspicious appearing pulmonary nodules or masses are noted. No acute consolidative airspace disease. No pleural effusions. Scattered areas of cylindrical bronchiectasis. Diffuse bronchial wall thickening with moderate centrilobular and paraseptal emphysema. Musculoskeletal: There are no aggressive appearing lytic or blastic lesions noted in the visualized portions of the skeleton. CT ABDOMEN PELVIS FINDINGS Hepatobiliary: Subcentimeter low-attenuation lesion in segment 4A of the liver, too small to characterize, but statistically likely to represent a tiny cyst. No other larger more suspicious appearing hepatic lesions are noted. No intra or extrahepatic biliary ductal dilatation. Gallbladder is normal in appearance. Pancreas: No pancreatic mass. No pancreatic ductal dilatation. No pancreatic or peripancreatic fluid collections or inflammatory changes. Spleen: Unremarkable. Adrenals/Urinary Tract: Bilateral kidneys and adrenal glands are normal in appearance. No hydroureteronephrosis in the visualized portions of the abdomen. Urinary bladder is normal in appearance. Stomach/Bowel: Percutaneous gastrostomy tube with retention balloon in the distal body of the stomach. Stomach is otherwise unremarkable in appearance. No pathologic dilatation of small bowel or colon. The appendix is not confidently identified and may be surgically absent. Regardless, there are no inflammatory changes noted adjacent to the cecum to suggest the presence of an acute appendicitis at this time. Vascular/Lymphatic: Aortic atherosclerosis, without evidence of aneurysm or dissection in the  abdominal or pelvic vasculature. No lymphadenopathy noted in the abdomen or pelvis. Reproductive: Status post hysterectomy. Ovaries are not confidently identified may be surgically absent or atrophic. Other: No significant volume of ascites.  No pneumoperitoneum. Musculoskeletal: There are no aggressive appearing lytic or blastic lesions noted in the visualized portions of the skeleton. IMPRESSION: 1. No definitive findings to suggest metastatic disease in the chest, abdomen or pelvis. 2. Stable appearance of nodular area of architectural distortion in the right upper lobe compared to prior examinations. This is favored to represent an area of post infectious or inflammatory scarring, but could represent a treated metastatic lesion. 3. Diffuse bronchial wall thickening with moderate centrilobular and paraseptal emphysema; imaging findings suggestive of underlying COPD. 4. Scattered areas of cylindrical bronchiectasis and mucoid impaction in the lungs, as above. 5. Small amount of pericardial fluid, increased compared to the prior study. 6. Aortic atherosclerosis, in addition to left main and 3 vessel coronary artery disease. Assessment for potential risk factor modification, dietary therapy or pharmacologic therapy may be warranted, if clinically indicated. 7. Additional incidental findings, as above. Electronically Signed   By: Vinnie Langton M.D.   On: 12/07/2019 14:23    PERFORMANCE STATUS (ECOG) : 3 - Symptomatic, >50% confined to bed  Review of Systems Unless otherwise noted, a complete review of systems is negative.  Physical Exam General: NAD, frail appearing, thin, in wheelchair Pulmonary: Unlabored Abdomen: soft, nontender, PEG noted but not  visualized GU: no suprapubic tenderness Extremities: no edema, no joint deformities Skin: no rashes Neurological: Weakness but otherwise nonfocal  IMPRESSION: Routine follow-up visit today.  Patient was accompanied by her husband.  Patient seems to  be doing about the same.  No significant changes or concerns were reported today.  CT scans reveal stable disease.   Husband says that he continues to give her morphine 3 times daily, which helps but is short-lived.  He asked if anything else could be given there would maintain longer control.  We discussed options for long-acting opioid.  Unfortunately, options are limited given her use of a PEG and inability to swallow pills.  I would not advise use of methadone.  Xtampza ER could be considered but would likely be cost prohibitive.  Husband is not interested in retrying fentanyl.  I suspect that there might be a component of anxiety/depression, which is exacerbating her symptoms.  Mirtazapine at bedtime has helped.  We discussed increasing the dose to 50 mg nightly.  Alternatively, could consider rotating to an SSRI or SNRI.  PLAN: -Continue surveillance -Continue morphine elixir 5 mg every 4 hours as needed for pain -May increase mirtazapine to 68m QHS -RTC in 3 months  Case and plan discussed with Dr. FGrayland Ormond Patient expressed understanding and was in agreement with this plan. She also understands that She can call the clinic at any time with any questions, concerns, or complaints.     Time Total: 15 minutes  Visit consisted of counseling and education dealing with the complex and emotionally intense issues of symptom management and palliative care in the setting of serious and potentially life-threatening illness.Greater than 50%  of this time was spent counseling and coordinating care related to the above assessment and plan.  Signed by: JAltha Harm PhD, NP-C [phone](Work Cell)

## 2019-12-12 NOTE — Progress Notes (Signed)
Nutrition Follow-up:  Patient with head and neck cancer stage IV and adenocarcinoma of colon.  Patient followed by Dr. Grayland Ormond and Palliative care.  Patient currently not receiving treatment.  Has PEG tube for nutrition  Met with patient and husband prior to MD visit.  Husband reports patient continue to tolerate ensure plus 5 cartons per day (7am, 1000, 1300, 1600 and 1900).  Continue with water flush 3-45 ml before and after feeding and at least 30-65m water with mediations (4 times per day). Husband reports patient has had issues with constipation but seems to have gotten better with prune juice giving on more regular basis.    Medications: reviewed  Labs: reviewed  Anthropometrics:   Weight 116 lb 8 oz today increased from 111 lb in Nov 2020.    UBW 122-124 lb  Nutrition Focused physical exam: Orbital region-moderate Cheek region-mild/moderate Upper arm- well nourished Ribs- mild/moderate Temporal - mild Clavicle- mild Calf - mild/moderate  Estimated Energy Needs  Kcals: 1250-1500 Protein: 62-75 g Fluid: 1.5 L  NUTRITION DIAGNOSIS: Inadequate oral intake bu relying on feeding tube   INTERVENTION:  Husband asking if can cut back on tube feeding due to weight gain.  RD does not recommend reducing nutrition at this time. UBW has been 122-124 lb.   IBW based off ht of 65 inches 130 lb.   Continue 5 cartons, ensure plus per day via feeding tube given 3 hours apart.   Husband to continue water flush. Tube feeding provides 1750 calories, 65 g protein and 12043mfree water with formula and flush not including with medications.  Husband has RD contact information.    MONITORING, EVALUATION, GOAL: weight trends, TF tolerance   NEXT VISIT: August 5th (Thursday)  Kerrigan Gombos B. AlZenia ResidesRDCatheys ValleyLDCraig Beachegistered Dietitian [phone])

## 2019-12-14 DIAGNOSIS — C189 Malignant neoplasm of colon, unspecified: Secondary | ICD-10-CM | POA: Diagnosis not present

## 2019-12-14 DIAGNOSIS — C760 Malignant neoplasm of head, face and neck: Secondary | ICD-10-CM | POA: Diagnosis not present

## 2019-12-19 ENCOUNTER — Telehealth: Payer: Self-pay | Admitting: *Deleted

## 2019-12-19 MED ORDER — FLUOXETINE HCL 20 MG/5ML PO SOLN: 20.0000 mg | Freq: Every day | ORAL | 3 refills | Status: DC

## 2019-12-19 MED ORDER — MORPHINE SULFATE (CONCENTRATE) 10 MG /0.5 ML PO SOLN: 5.0000 mg | ORAL | 0 refills | Status: DC | PRN

## 2019-12-19 NOTE — Telephone Encounter (Signed)
Husband called reporting the Mirtazapine 7.5 mg (2 tabs of this) is not controlling her pain and that J Borders, NP said that there is another medicine that can be tried if this does not work. He is also requesting a refill of her Morphine. Please return his call to discuss medicine change [phone]

## 2019-12-19 NOTE — Telephone Encounter (Signed)
Spoke with patient's husband by phone.  He requests refill of morphine to treat pain.  He continues to endorse that patient says she "does not feel good." However, he is unable to elucidate any further.  He denies any acute complaints.  This is a chronic issue that has been going on for years.  I had adjusted patient's mirtazapine thinking that there could be a component of depression.  I explained to husband that it has not really been enough time to see if the increase mirtazapine has worked but he is requesting that we discontinue mirtazapine and rotate to another antidepressant.  We will start Prozac elixir 20 mg daily in hope that that will be somewhat energizing and help patient feel better.  I have explained to husband and he verbalized an understanding that it will take weeks to know if an SSRI is effective.  Plan: -Refill morphine elixir 5 mg every 4 hours as needed for pain, #30 mL -Discontinue mirtazapine -Start Prozac elixir (20 mg per 5 mL) 20 mg per PEG daily

## 2019-12-28 DIAGNOSIS — R5381 Other malaise: Secondary | ICD-10-CM | POA: Diagnosis not present

## 2019-12-28 DIAGNOSIS — R5383 Other fatigue: Secondary | ICD-10-CM | POA: Diagnosis not present

## 2019-12-28 DIAGNOSIS — C760 Malignant neoplasm of head, face and neck: Secondary | ICD-10-CM | POA: Diagnosis not present

## 2019-12-28 DIAGNOSIS — I10 Essential (primary) hypertension: Secondary | ICD-10-CM | POA: Diagnosis not present

## 2019-12-28 DIAGNOSIS — J449 Chronic obstructive pulmonary disease, unspecified: Secondary | ICD-10-CM | POA: Diagnosis not present

## 2019-12-28 DIAGNOSIS — N1831 Chronic kidney disease, stage 3a: Secondary | ICD-10-CM | POA: Diagnosis not present

## 2020-01-02 ENCOUNTER — Other Ambulatory Visit: Payer: Self-pay | Admitting: Hospice and Palliative Medicine

## 2020-01-02 MED ORDER — MIRTAZAPINE 15 MG PO TABS: 15.0000 mg | ORAL_TABLET | Freq: Every day | ORAL | 2 refills | Status: AC

## 2020-01-02 NOTE — Progress Notes (Signed)
Received a call from patient's husband.  He reports that patient has had increased nausea on the fluoxetine.  He would like it discontinued and for patient to be restarted on mirtazapine. Resume mirtazapine 15mg  QHS.

## 2020-01-23 DIAGNOSIS — C189 Malignant neoplasm of colon, unspecified: Secondary | ICD-10-CM | POA: Diagnosis not present

## 2020-01-23 DIAGNOSIS — C760 Malignant neoplasm of head, face and neck: Secondary | ICD-10-CM | POA: Diagnosis not present

## 2020-01-24 DIAGNOSIS — K9420 Gastrostomy complication, unspecified: Secondary | ICD-10-CM | POA: Diagnosis not present

## 2020-01-26 ENCOUNTER — Other Ambulatory Visit: Payer: Self-pay | Admitting: *Deleted

## 2020-01-26 MED ORDER — MORPHINE SULFATE (CONCENTRATE) 10 MG /0.5 ML PO SOLN: 5.0000 mg | ORAL | 0 refills | Status: DC | PRN

## 2020-01-31 ENCOUNTER — Other Ambulatory Visit: Payer: Self-pay | Admitting: *Deleted

## 2020-01-31 MED ORDER — PROCHLORPERAZINE MALEATE 10 MG PO TABS: 10.0000 mg | ORAL_TABLET | Freq: Four times a day (QID) | ORAL | 2 refills | Status: DC | PRN

## 2020-02-14 DIAGNOSIS — L299 Pruritus, unspecified: Secondary | ICD-10-CM | POA: Diagnosis not present

## 2020-02-14 DIAGNOSIS — C148 Malignant neoplasm of overlapping sites of lip, oral cavity and pharynx: Secondary | ICD-10-CM | POA: Diagnosis not present

## 2020-02-22 DIAGNOSIS — C189 Malignant neoplasm of colon, unspecified: Secondary | ICD-10-CM | POA: Diagnosis not present

## 2020-02-22 DIAGNOSIS — C760 Malignant neoplasm of head, face and neck: Secondary | ICD-10-CM | POA: Diagnosis not present

## 2020-02-24 ENCOUNTER — Other Ambulatory Visit: Payer: Self-pay | Admitting: *Deleted

## 2020-02-24 MED ORDER — MORPHINE SULFATE (CONCENTRATE) 10 MG /0.5 ML PO SOLN: 5.0000 mg | ORAL | 0 refills | Status: DC | PRN

## 2020-03-18 NOTE — Progress Notes (Signed)
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  INTERVAL HISTORY: Patient returns to clinic today for routine 53-monthevaluation.  She appears uncomfortable today and her husband reports that her pain is increased. She has chronic weakness and fatigue.  Her husband answers most of the questions and he reports no other complaints.  She continues to require 100% of nutrition via her PEG tube. She has no neurologic complaints.  She denies any recent fevers or illnesses.  She denies any chest pain, shortness of breath, cough, or hemoptysis.  She has no nausea, vomiting, constipation, or diarrhea.  She has no urinary complaints.  Patient offers no further specific complaints today.  REVIEW OF SYSTEMS:   Review of Systems  Constitutional: Positive for malaise/fatigue. Negative for fever and weight loss.  HENT: Negative.  Negative for sore throat.   Respiratory: Negative.  Negative for cough, shortness of breath and stridor.   Cardiovascular: Negative.  Negative for chest pain and leg swelling.  Gastrointestinal: Negative.  Negative for abdominal pain, constipation, diarrhea, nausea and vomiting.  Genitourinary: Negative.  Negative for dysuria.  Musculoskeletal: Positive for neck pain. Negative for falls and joint pain.  Skin: Negative.  Negative for itching and rash.  Neurological: Positive for weakness. Negative for sensory change, focal weakness and headaches.  Psychiatric/Behavioral: Positive for memory loss. Negative for depression and hallucinations. The patient is not  nervous/anxious and does not have insomnia.     As per HPI. Otherwise, a complete review of systems is negative.  PAST MEDICAL HISTORY: Past Medical History:  Diagnosis Date  . Acute hypoxemic respiratory failure ([facility] 11/15/2017  . Acute respiratory failure ([facility] 11/15/2017  . Adenocarcinoma of colon ([facility]   . Benign neoplasm of ascending colon   . CAP (community acquired pneumonia) 11/15/2017  . Essential hypertension 06/13/2014  . Goals of care, counseling/discussion 07/14/2017  . Healthcare-associated pneumonia   . HLD (hyperlipidemia) 06/13/2014  . Hypercholesteremia   . Hyperlipidemia   . Melena   . Multiple thyroid nodules 01/22/2012  . Osteoporosis   . Polycythemia, secondary 12/26/2014  . Pure hypercholesterolemia 03/15/2015  . Squamous cell carcinoma of head and neck ([facility]   . Thyroid nodule     PAST SURGICAL HISTORY: Past Surgical History:  Procedure Laterality Date  . ABDOMINAL HYSTERECTOMY    . COLONOSCOPY WITH PROPOFOL N/A 07/02/2017   Procedure: COLONOSCOPY WITH PROPOFOL;  Surgeon: WLucilla Lame MD;  [place] [facility]  Service: Endoscopy;  Laterality: N/A;  . ESOPHAGOGASTRODUODENOSCOPY N/A 05/17/2019   Procedure: ESOPHAGOGASTRODUODENOSCOPY (EGD);  Surgeon: VLin Landsman MD;  [facility];  Service: Gastroenterology;  Laterality: N/A;  . ORIF ANKLE FRACTURE Left 08/08/2017   Procedure: OPEN REDUCTION INTERNAL FIXATION (ORIF) ANKLE FRACTURE;  Surgeon: HDereck Leep MD;  Location: [facility];  Service: Orthopedics;  Laterality: Left;  . POLYPECTOMY N/A 07/02/2017   Procedure: POLYPECTOMY;  Surgeon: WLucilla Lame MD;  [place] [facility]  Service: Endoscopy;  Laterality: N/A;  . WRIST FRACTURE SURGERY  08/2009   badly broken    FAMILY HISTORY Family History  Problem Relation Age of Onset  . Heart disease Mother   .  Diabetes Father        ADVANCED DIRECTIVES:    HEALTH MAINTENANCE: Social History   Tobacco Use  .  Smoking status: Former Smoker    Packs/day: 0.25    Types: Cigarettes    Quit date: 06/17/2017    Years since quitting: 2.7  . Smokeless tobacco: Never Used  Vaping Use  . Vaping Use: Never used  Substance Use Topics  . Alcohol use: No  . Drug use: No     Allergies  Allergen Reactions  . Potassium Nausea Only and Nausea And Vomiting  . Other Other (See Comments) and Rash    TDAP tdap TDAP  . Tetanus Antitoxin Rash    Current Outpatient Medications  Medication Sig Dispense Refill  . acetaminophen (TYLENOL) 500 MG tablet Take by mouth.    . bacitracin ointment APPLY TO AFFECTED AREA TWICE A DAY    . cholestyramine (QUESTRAN) 4 GM/DOSE powder Take 1 packet (4 g total) by mouth 3 (three) times daily with meals. (Patient taking differently: Place 4 g into feeding tube 3 (three) times daily with meals. ) 378 g 12  . clotrimazole-betamethasone (LOTRISONE) cream APPLY TO AFFECTED AREA TWICE A DAY    . Ensure Plus (ENSURE PLUS) LIQD Give 1 carton of ensure plus 5 times daily via feeding tube.  Flush with 108m of water before and after feeding. 1185 mL 0  . hydrOXYzine (ATARAX/VISTARIL) 10 MG tablet Place 10 mg into feeding tube 2 (two) times daily as needed.     . mirtazapine (REMERON) 15 MG tablet Take 1 tablet (15 mg total) by mouth at bedtime. Crush and administer via feeding tube. 30 tablet 2  . Morphine Sulfate (MORPHINE CONCENTRATE) 10 mg / 0.5 ml concentrated solution Place 0.25 mLs (5 mg total) into feeding tube every 4 (four) hours as needed for moderate pain or severe pain. 30 mL 0  . NARCAN 4 MG/0.1ML LIQD nasal spray kit USE 1 SPRAY PER INTRANASAL ROUTE AS DIRECTED  0  . pravastatin (PRAVACHOL) 10 MG tablet Place 10 mg into feeding tube daily.     . prochlorperazine (COMPAZINE) 10 MG tablet Place 1 tablet (10 mg total) into feeding tube every 6 (six) hours as needed for nausea or vomiting. 90 tablet 2  . oxyCODONE ER (XTAMPZA ER) 9 MG C12A Take 1 capsule by mouth every 12  (twelve) hours. 60 capsule 0   No current facility-administered medications for this visit.   Facility-Administered Medications Ordered in Other Visits  Medication Dose Route Frequency Provider Last Rate Last Admin  . heparin lock flush 100 unit/mL  250 Units Intracatheter PRN PLeia Alf MD      . heparin lock flush 100 unit/mL  500 Units Intracatheter PRN PLeia Alf MD      . potassium chloride 20 mEq in sodium chloride 0.9 % 500 mL injection   Intravenous Once STye Savoy Doni H, RN      . sodium chloride 0.9 % injection 10 mL  10 mL Intracatheter PRN PLeia Alf MD      . sodium chloride 0.9 % injection 10 mL  10 mL Intracatheter PRN PLeia Alf MD      . sodium chloride 0.9 % injection 10 mL  10 mL Intracatheter PRN PMa Hillock Sandeep, MD      . sodium chloride 0.9 % injection 10 mL  10 mL Intracatheter PRN PLeia Alf MD      -year-old  OBJECTIVE: Vitals:   03/22/20 1012 03/22/20 1026  BP:  (Marland Kitchen  115/103  Pulse:  (!) 133  Resp: 20 20  Temp: (!) 96.4 F (35.8 C) (!) 96.4 F (35.8 C)  SpO2: 92%      Body mass index is 20 kg/m.    ECOG FS:2 - Symptomatic, <50% confined to bed  General: Thin, mild discomfort, sitting in wheelchair. Eyes: Pink conjunctiva, anicteric sclera. HEENT: Normocephalic, moist mucous membranes. Significant surgical changes noted.  No palpable lymphadenopathy. Lungs: No audible wheezing or coughing. Heart: Regular rate and rhythm. Abdomen: Soft, nontender, no obvious distention. Musculoskeletal: No edema, cyanosis, or clubbing. Neuro: Alert. Cranial nerves grossly intact. Skin: No rashes or petechiae noted. Psych: Flat affect.   LAB RESULTS:  Lab Results  Component Value Date   NA 138 03/22/2020   K 4.0 03/22/2020   CL 105 03/22/2020   CO2 22 03/22/2020   GLUCOSE 127 (H) 03/22/2020   BUN 38 (H) 03/22/2020   CREATININE 0.84 03/22/2020   CALCIUM 9.0 03/22/2020   PROT 6.7 05/16/2019   ALBUMIN 3.7 05/16/2019   AST 32  05/16/2019   ALT 25 05/16/2019   ALKPHOS 91 05/16/2019   BILITOT 0.5 05/16/2019   GFRNONAA >60 03/22/2020   GFRAA >60 03/22/2020    Lab Results  Component Value Date   WBC 6.8 03/22/2020   NEUTROABS 4.2 03/22/2020   HGB 15.9 (H) 03/22/2020   HCT 46.2 (H) 03/22/2020   MCV 92.6 03/22/2020   PLT 259 03/22/2020     STUDIES: No results found.  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  PLAN:    1.  Recurrent, progressive stage IVa head and neck squamous cell carcinoma: Patient's last infusion of nivolumab was on November 11, 2018.  CT scan results from December 07, 2019 reviewed independently and report as above with no obvious evidence of recurrent or progressive disease.  Continue follow-up with ENT as indicated.  Her performance status remains decreased and unchanged.  No intervention is needed at this time.  Return to clinic in 3 months with repeat laboratory and imaging.  Appreciate palliative care input. 2.  Diarrhea: Patient does not complain of this today.  Intermittent.  Continue Imodium and Lomotil as needed.   3.  Decreased performance status: Likely multifactorial.  Appreciate palliative care input.   4.  Nausea/vomiting: Patient does not complain of this today.  Continue Phenergan as needed. 5.  Pain: Husband reports it is worse today.  Have recommended long-acting narcotic as before.  Also have placed pain clinic referral.  Appreciate palliative care input. 6.  PEG tube: Continue tube feeds as per dietary.  Appreciate dietary input.  Patient and her husband expressed understanding that they can call or return to clinic at any time if they have any questions, concerns, or complaints.  [name], MD 03/22/20 12:20 PM

## 2020-03-21 ENCOUNTER — Other Ambulatory Visit: Payer: Self-pay | Admitting: *Deleted

## 2020-03-21 DIAGNOSIS — C760 Malignant neoplasm of head, face and neck: Secondary | ICD-10-CM

## 2020-03-22 ENCOUNTER — Inpatient Hospital Stay: Payer: PPO | Attending: Oncology

## 2020-03-22 ENCOUNTER — Inpatient Hospital Stay: Payer: PPO

## 2020-03-22 ENCOUNTER — Inpatient Hospital Stay (HOSPITAL_BASED_OUTPATIENT_CLINIC_OR_DEPARTMENT_OTHER): Payer: PPO | Admitting: Oncology

## 2020-03-22 ENCOUNTER — Other Ambulatory Visit: Payer: Self-pay

## 2020-03-22 ENCOUNTER — Inpatient Hospital Stay (HOSPITAL_BASED_OUTPATIENT_CLINIC_OR_DEPARTMENT_OTHER): Payer: PPO | Admitting: Hospice and Palliative Medicine

## 2020-03-22 ENCOUNTER — Encounter: Payer: Self-pay | Admitting: Oncology

## 2020-03-22 VITALS — BP 115/103 | HR 133 | Temp 96.4°F | Resp 20 | Wt 125.8 lb

## 2020-03-22 DIAGNOSIS — I10 Essential (primary) hypertension: Secondary | ICD-10-CM | POA: Diagnosis not present

## 2020-03-22 DIAGNOSIS — C760 Malignant neoplasm of head, face and neck: Secondary | ICD-10-CM

## 2020-03-22 DIAGNOSIS — Z79899 Other long term (current) drug therapy: Secondary | ICD-10-CM | POA: Insufficient documentation

## 2020-03-22 DIAGNOSIS — Z515 Encounter for palliative care: Secondary | ICD-10-CM | POA: Diagnosis not present

## 2020-03-22 DIAGNOSIS — G8929 Other chronic pain: Secondary | ICD-10-CM | POA: Insufficient documentation

## 2020-03-22 DIAGNOSIS — Z90710 Acquired absence of both cervix and uterus: Secondary | ICD-10-CM | POA: Diagnosis not present

## 2020-03-22 DIAGNOSIS — Z931 Gastrostomy status: Secondary | ICD-10-CM | POA: Diagnosis not present

## 2020-03-22 DIAGNOSIS — Z87891 Personal history of nicotine dependence: Secondary | ICD-10-CM | POA: Diagnosis not present

## 2020-03-22 DIAGNOSIS — G893 Neoplasm related pain (acute) (chronic): Secondary | ICD-10-CM | POA: Diagnosis not present

## 2020-03-22 DIAGNOSIS — Z85038 Personal history of other malignant neoplasm of large intestine: Secondary | ICD-10-CM | POA: Diagnosis not present

## 2020-03-22 DIAGNOSIS — E785 Hyperlipidemia, unspecified: Secondary | ICD-10-CM | POA: Diagnosis not present

## 2020-03-22 LAB — BASIC METABOLIC PANEL
Anion gap: 11 (ref 5–15)
BUN: 38 mg/dL — ABNORMAL HIGH (ref 8–23)
CO2: 22 mmol/L (ref 22–32)
Calcium: 9 mg/dL (ref 8.9–10.3)
Chloride: 105 mmol/L (ref 98–111)
Creatinine, Ser: 0.84 mg/dL (ref 0.44–1.00)
GFR calc Af Amer: >60 mL/min (ref >60)
GFR calc non Af Amer: >60 mL/min (ref >60)
Glucose, Bld: 127 mg/dL — ABNORMAL HIGH (ref 70–99)
Potassium: 4 mmol/L (ref 3.5–5.1)
Sodium: 138 mmol/L (ref 135–145)

## 2020-03-22 LAB — CBC WITH DIFFERENTIAL/PLATELET
Abs Immature Granulocytes: 0.02 10*3/uL (ref 0.00–0.07)
Basophils Absolute: 0 10*3/uL (ref 0.0–0.1)
Basophils Relative: 0 %
Eosinophils Absolute: 0.1 10*3/uL (ref 0.0–0.5)
Eosinophils Relative: 1 %
HCT: 46.2 % — ABNORMAL HIGH (ref 36.0–46.0)
Hemoglobin: 15.9 g/dL — ABNORMAL HIGH (ref 12.0–15.0)
Immature Granulocytes: 0 %
Lymphocytes Relative: 31 %
Lymphs Abs: 2.1 10*3/uL (ref 0.7–4.0)
MCH: 31.9 pg (ref 26.0–34.0)
MCHC: 34.4 g/dL (ref 30.0–36.0)
MCV: 92.6 fL (ref 80.0–100.0)
Monocytes Absolute: 0.4 10*3/uL (ref 0.1–1.0)
Monocytes Relative: 5 %
Neutro Abs: 4.2 10*3/uL (ref 1.7–7.7)
Neutrophils Relative %: 63 %
Platelets: 259 10*3/uL (ref 150–400)
RBC: 4.99 MIL/uL (ref 3.87–5.11)
RDW: 11.9 % (ref 11.5–15.5)
WBC: 6.8 10*3/uL (ref 4.0–10.5)
nRBC: 0 % (ref 0.0–0.2)

## 2020-03-22 MED ORDER — XTAMPZA ER 9 MG PO C12A: 1.0000 | EXTENDED_RELEASE_CAPSULE | Freq: Two times a day (BID) | ORAL | 0 refills | Status: DC

## 2020-03-22 NOTE — Progress Notes (Signed)
Nutrition Follow-up:  Patient with head and neck cancer stage IV and adenocarcinoma of colon.  Patient followed by Dr. Grayland Ormond and Palliative care.  Patient currently not receiving treatment.  PEG tube for nutrition.   Met with patient and husband after MD visit.  Husband reports patient continues to tolerate ensure plus 5 cartons per day (7am, 10, 1300, 1600 and 1900).  Provides water flush of 30-32m before and after feeding and with medications (4 times per day).  Husband reports bowel movements have been relatively normal for the past 2 months.    Husband asking to have tube feeding reduced due to weight gain.  Medications: reviewed  Labs: reviewed  Anthropometrics:   Weight 125 lb 12.8 oz today in clinic increased from 116 lb 8 oz on 12/12/19  111 lb on Nov 2020  UBW 122-124 lb IBW 130 lb based on ht of 65 inches   Estimated Energy Needs  Kcals: 1250-1500 Protein: 62-75 g Fluid: 1.5 L  NUTRITION DIAGNOSIS: Inadequate oral intake but relying on feeding tube for nutrition   INTERVENTION:  Continue to NOT recommend reducing tube feeding at this time.  UBW has been 122-124 lb.  IBW based on ht is 130 lb.  Would like to continue to monitor weight trends at next visit.  Continue 5 cartons of ensure plus per day via feeding tube given 3 hours apart Provides 1750 calories, 65 g protein and 1200 ml free water with formula and flush, not including with medications.  Husband has RD contact information    MONITORING, EVALUATION, GOAL: weight trends, TF tolerance   NEXT VISIT: Nov 11 th in clinic  [place] LBel-NorRegistered Dietitian [phone](mobile)

## 2020-03-22 NOTE — Progress Notes (Signed)
[facility]  [phone] [phone]) [phone]   Name: Dana Perez Date: 03/22/2020 MRN: [id number]  DOB: [birth year]  Patient Care Team: [name], MD as PCP - General (Physician Assistant) [name], MD as Referring Physician (Surgery) [name], MD as Consulting Physician (Oncology)    REASON FOR CONSULTATION: Dana Perez is a33 y.o.femalewith multiple medical problems including stage IV head and neck squamous cell carcinoma status post chemotherapy, surgery, radiation, and nivolumab. PMH also includes adenocarcinoma of the colon not currently on treatment, malnutrition status post PEG insertion, she had bilateral ankle fractures causing a marked reduction in her performance status, chronic pain on transdermal fentanyl, hypertension, hyperlipidemia, and CKD stage III. Patient had disease progression and general decline despite on previous treatment. Hospice had been discussed but patient/husband opted to continue treatment with nivolumab. Patient had good response to nivolumab.  Palliative care is now asked to follow and provide her with support.  SOCIAL HISTORY:     reports that she quit smoking about 2 years ago. Her smoking use included cigarettes. She smoked 0.25 packs per day. She has never used smokeless tobacco. She reports that she does not drink alcohol and does not use drugs.   Patient is married. She lives at home with her husband. She has no children. 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  ADVANCE DIRECTIVES:  Patient does not currently have advance directives. She would want her husband to be her decision-maker. Patient and husband have talked about completing a living will but have not currently done so.   CODE STATUS: DNR  PAST MEDICAL HISTORY: Past Medical History:  Diagnosis Date  . Acute hypoxemic  respiratory failure ([place]) 11/15/2017  . Acute respiratory failure ([place]) 11/15/2017  . Adenocarcinoma of colon ([place])   . Benign neoplasm of ascending colon   . CAP (community acquired pneumonia) 11/15/2017  . Essential hypertension 06/13/2014  . Goals of care, counseling/discussion 07/14/2017  . Healthcare-associated pneumonia   . HLD (hyperlipidemia) 06/13/2014  . Hypercholesteremia   . Hyperlipidemia   . Melena   . Multiple thyroid nodules 01/22/2012  . Osteoporosis   . Polycythemia, secondary 12/26/2014  . Pure hypercholesterolemia 03/15/2015  . Squamous cell carcinoma of head and neck ([place])   . Thyroid nodule     PAST SURGICAL HISTORY:  Past Surgical History:  Procedure Laterality Date  . ABDOMINAL HYSTERECTOMY    . COLONOSCOPY WITH PROPOFOL N/A 07/02/2017   Procedure: COLONOSCOPY WITH PROPOFOL;  Surgeon: Lucilla Lame, MD;  Location: [facility];  Service: Endoscopy;  Laterality: N/A;  . ESOPHAGOGASTRODUODENOSCOPY N/A 05/17/2019   Procedure: ESOPHAGOGASTRODUODENOSCOPY (EGD);  Surgeon: Lin Landsman, MD;  [facility];  Service: Gastroenterology;  Laterality: N/A;  . ORIF ANKLE FRACTURE Left 08/08/2017   Procedure: OPEN REDUCTION INTERNAL FIXATION (ORIF) ANKLE FRACTURE;  Surgeon: Dereck Leep, MD;  Location: [facility] ORS;  Service: Orthopedics;  Laterality: Left;  . POLYPECTOMY N/A 07/02/2017   Procedure: POLYPECTOMY;  Surgeon: Lucilla Lame, MD;  Location: [facility];  Service: Endoscopy;  Laterality: N/A;  . WRIST FRACTURE SURGERY  08/2009   badly broken    HEMATOLOGY/ONCOLOGY HISTORY:  Oncology History  Squamous cell carcinoma of head and neck ([place])  07/01/2017 Initial Diagnosis   Squamous cell carcinoma of head and neck ([place])   04/08/2018 -  Chemotherapy   The patient had nivolumab (OPDIVO) 240 mg in sodium chloride 0.9 %  100 mL chemo infusion, 240 mg, Intravenous, Once, 16 of 20 cycles Administration: 240 mg (04/08/2018), 240 mg (04/22/2018), 240  mg (05/06/2018), 240 mg (05/20/2018), 240 mg (06/03/2018), 240 mg (06/24/2018), 240 mg (07/08/2018), 240 mg (07/22/2018), 240 mg (08/05/2018), 240 mg (08/19/2018), 240 mg (09/02/2018), 240 mg (09/16/2018), 240 mg (09/30/2018), 240 mg (10/14/2018), 240 mg (10/28/2018), 240 mg (11/11/2018)  for chemotherapy treatment.      ALLERGIES:  is allergic to potassium, other, and tetanus antitoxin.  MEDICATIONS:  Current Outpatient Medications  Medication Sig Dispense Refill  . acetaminophen (TYLENOL) 500 MG tablet Take by mouth.    . bacitracin ointment APPLY TO AFFECTED AREA TWICE A DAY    . cholestyramine (QUESTRAN) 4 GM/DOSE powder Take 1 packet (4 g total) by mouth 3 (three) times daily with meals. (Patient taking differently: Place 4 g into feeding tube 3 (three) times daily with meals. ) 378 g 12  . clotrimazole-betamethasone (LOTRISONE) cream APPLY TO AFFECTED AREA TWICE A DAY    . Ensure Plus (ENSURE PLUS) LIQD Give 1 carton of ensure plus 5 times daily via feeding tube.  Flush with 78m of water before and after feeding. 1185 mL 0  . hydrOXYzine (ATARAX/VISTARIL) 10 MG tablet Place 10 mg into feeding tube 2 (two) times daily as needed.     . mirtazapine (REMERON) 15 MG tablet Take 1 tablet (15 mg total) by mouth at bedtime. Crush and administer via feeding tube. 30 tablet 2  . Morphine Sulfate (MORPHINE CONCENTRATE) 10 mg / 0.5 ml concentrated solution Place 0.25 mLs (5 mg total) into feeding tube every 4 (four) hours as needed for moderate pain or severe pain. 30 mL 0  . NARCAN 4 MG/0.1ML LIQD nasal spray kit USE 1 SPRAY PER INTRANASAL ROUTE AS DIRECTED  0  . pravastatin (PRAVACHOL) 10 MG tablet Place 10 mg into feeding tube daily.     . prochlorperazine (COMPAZINE) 10 MG tablet Place 1 tablet (10 mg total) into feeding tube every 6 (six) hours as needed for nausea or vomiting. 90 tablet 2   No current facility-administered medications for this visit.   Facility-Administered Medications Ordered in Other  Visits  Medication Dose Route Frequency Provider Last Rate Last Admin  . heparin lock flush 100 unit/mL  250 Units Intracatheter PRN PLeia Alf MD      . heparin lock flush 100 unit/mL  500 Units Intracatheter PRN PLeia Alf MD      . potassium chloride 20 mEq in sodium chloride 0.9 % 500 mL injection   Intravenous Once STye Savoy Doni H, RN      . sodium chloride 0.9 % injection 10 mL  10 mL Intracatheter PRN PLeia Alf MD      . sodium chloride 0.9 % injection 10 mL  10 mL Intracatheter PRN PLeia Alf MD      . sodium chloride 0.9 % injection 10 mL  10 mL Intracatheter PRN PMa Hillock Sandeep, MD      . sodium chloride 0.9 % injection 10 mL  10 mL Intracatheter PRN PLeia Alf MD        VITAL SIGNS: There were no vitals taken for this visit. There were no vitals filed for this visit.  Estimated body mass index is 20 kg/m as calculated from the following:   Height as of 05/16/19: 5' 6.5" (1.689 m).   Weight as of an earlier encounter on 03/22/20: 125 lb 12.8 oz (57.1 kg).  LABS: CBC:    Component Value Date/Time  WBC 6.8 03/22/2020 0950   HGB 15.9 (H) 03/22/2020 0950   HCT 46.2 (H) 03/22/2020 0950   PLT 259 03/22/2020 0950   MCV 92.6 03/22/2020 0950   NEUTROABS 4.2 03/22/2020 0950   LYMPHSABS 2.1 03/22/2020 0950   MONOABS 0.4 03/22/2020 0950   EOSABS 0.1 03/22/2020 0950   BASOSABS 0.0 03/22/2020 0950   Comprehensive Metabolic Panel:    Component Value Date/Time   NA 138 03/22/2020 0950   K 4.0 03/22/2020 0950   CL 105 03/22/2020 0950   CO2 22 03/22/2020 0950   BUN 38 (H) 03/22/2020 0950   CREATININE 0.84 03/22/2020 0950   GLUCOSE 127 (H) 03/22/2020 0950   CALCIUM 9.0 03/22/2020 0950   AST 32 05/16/2019 1257   ALT 25 05/16/2019 1257   ALKPHOS 91 05/16/2019 1257   BILITOT 0.5 05/16/2019 1257   PROT 6.7 05/16/2019 1257   ALBUMIN 3.7 05/16/2019 1257    RADIOGRAPHIC STUDIES: No results found.  PERFORMANCE STATUS (ECOG) : 3 - Symptomatic, >50%  confined to bed  Review of Systems Unless otherwise noted, a complete review of systems is negative.  Physical Exam General: NAD, frail appearing, thin, in wheelchair Pulmonary: Unlabored Abdomen: soft, nontender, PEG noted but not visualized GU: no suprapubic tenderness Extremities: no edema, no joint deformities Skin: no rashes Neurological: Weakness but otherwise nonfocal  IMPRESSION: Routine follow-up visit today.  Patient was accompanied by her husband.  Patient appears to be doing about the same.  She does endorse generalized pain today and appears to be uncomfortable sitting in the wheelchair.  Husband states that he is giving her the morphine on average about 3 times a day.  The morphine is reportedly helping but short-lived.  We again discussed her limited options for adding a long-acting opioid given her inability to swallow tablets and husband's insistence that she did not tolerate transdermal fentanyl (he previously discontinued that medication).  Xtampza ER would be an option in the administration through her PEG but it is unclear if her insurance will cover this and it would be cost prohibitive if not.  Husband does not want any prescription for [facility] ER sent to see if insurance will cover.  Otherwise, he seemed more interested in possible retrial of transdermal fentanyl.  I do not think that patient would be a viable candidate for alternative options such as Butrans or methadone.  Note that patient was supposed to be referred to a pain clinic by ENT but is not clear if this was done.  Husband states that he has not been contacted.  Will refer to [facility] pain clinic.  PDMP reviewed  PLAN: -Continued surveillance -Continue morphine elixir 5 mg every 4 hours as needed for pain -Xtampza ER 41m Q12H (#60) -Referral to pain clinic -RTC in 3 months  Case and plan discussed with Dr. FGrayland Ormond Patient expressed understanding and was in agreement with this plan. She also  understands that She can call the clinic at any time with any questions, concerns, or complaints.    Time Total: 15 minutes  Visit consisted of counseling and education dealing with the complex and emotionally intense issues of symptom management and palliative care in the setting of serious and potentially life-threatening illness.Greater than 50%  of this time was spent counseling and coordinating care related to the above assessment and plan.  Signed by: JAltha Harm PhD, NP-C [phone](Work Cell)

## 2020-03-23 DIAGNOSIS — C189 Malignant neoplasm of colon, unspecified: Secondary | ICD-10-CM | POA: Diagnosis not present

## 2020-03-23 DIAGNOSIS — C760 Malignant neoplasm of head, face and neck: Secondary | ICD-10-CM | POA: Diagnosis not present

## 2020-03-29 ENCOUNTER — Encounter: Payer: Self-pay | Admitting: Pain Medicine

## 2020-03-29 ENCOUNTER — Ambulatory Visit: Payer: PPO | Attending: Pain Medicine | Admitting: Pain Medicine

## 2020-03-29 ENCOUNTER — Other Ambulatory Visit: Payer: Self-pay

## 2020-03-29 VITALS — BP 141/58 | HR 95 | Temp 97.2°F | Resp 16 | Ht 67.0 in | Wt 125.0 lb

## 2020-03-29 DIAGNOSIS — R519 Headache, unspecified: Secondary | ICD-10-CM | POA: Diagnosis not present

## 2020-03-29 DIAGNOSIS — C760 Malignant neoplasm of head, face and neck: Secondary | ICD-10-CM | POA: Diagnosis not present

## 2020-03-29 DIAGNOSIS — G893 Neoplasm related pain (acute) (chronic): Secondary | ICD-10-CM | POA: Diagnosis not present

## 2020-03-29 DIAGNOSIS — G894 Chronic pain syndrome: Secondary | ICD-10-CM

## 2020-03-29 DIAGNOSIS — G501 Atypical facial pain: Secondary | ICD-10-CM | POA: Diagnosis not present

## 2020-03-29 DIAGNOSIS — K1379 Other lesions of oral mucosa: Secondary | ICD-10-CM | POA: Diagnosis not present

## 2020-03-29 DIAGNOSIS — M899 Disorder of bone, unspecified: Secondary | ICD-10-CM | POA: Diagnosis not present

## 2020-03-29 DIAGNOSIS — Z789 Other specified health status: Secondary | ICD-10-CM | POA: Diagnosis not present

## 2020-03-29 DIAGNOSIS — R6884 Jaw pain: Secondary | ICD-10-CM

## 2020-03-29 DIAGNOSIS — Z79899 Other long term (current) drug therapy: Secondary | ICD-10-CM | POA: Insufficient documentation

## 2020-03-29 DIAGNOSIS — G8929 Other chronic pain: Secondary | ICD-10-CM

## 2020-03-29 MED ORDER — BUPRENORPHINE 7.5 MCG/HR TD PTWK: 1.0000 | MEDICATED_PATCH | TRANSDERMAL | 0 refills | Status: DC

## 2020-03-29 NOTE — Progress Notes (Signed)
Patient: Dana Perez  Service Category: E/M  Provider: Gaspar Cola, MD  DOB: 02-09-1941  DOS: 03/29/2020  Referring Provider: [name], MD  MRN: [id number]  Setting: Ambulatory outpatient  PCP: [name], MD  Type: New Patient  Specialty: Interventional Pain Management    Location: Office  Delivery: Face-to-face     Primary Reason(s) for Visit: Encounter for initial evaluation of one or more chronic problems (new to examiner) potentially causing chronic pain, and posing a threat to normal musculoskeletal function. (Level of risk: High) CC: Jaw Pain (RIGHT ) and Generalized Body Aches (sometimes just hurts all over )  HPI  Dana Perez is a 79 y.o. year old, female patient, who comes for the first time to our practice referred by [name], MD [address] [place],  [place] [id number], for our initial evaluation of her chronic pain. She has Multiple thyroid nodules; Polycythemia, secondary; HLD (hyperlipidemia); Pure hypercholesterolemia; Erythrocytosis; Hyperlipidemia, unspecified; Essential hypertension; Squamous cell carcinoma of head and neck ([place]); Adenocarcinoma of colon ([place]); Goals of care, counseling/discussion; Closed left ankle fracture; Malnutrition of moderate degree; Oral candidiasis; Trimalleolar fracture of ankle, closed, left, with routine healing, subsequent encounter; Cancer associated pain; CKD (chronic kidney disease) stage 3, GFR 30-59 ml/min; Thyroid nodule; Skin lesion; Protein-calorie malnutrition, severe; Chronic pain syndrome; Pharmacologic therapy; Disorder of skeletal system; Problems influencing health status; Chronic face pain (Right); Right-sided face pain; Atypical face pain; Maxillary pain (Right); Mandibular pain (Right); and Mouth pain on their problem list. Today she comes in for evaluation of her Jaw Pain (RIGHT ) and Generalized Body Aches (sometimes just hurts all over )  Pain Assessment: Location: Right Jaw Radiating:  goes back into the right ear Onset: More than a month ago Duration: Chronic pain Quality: Discomfort, Constant Severity: 5 /10 (subjective, self-reported pain score)  Effect on ADL: patient normally just lies on couch. Timing: Constant Modifying factors: morphine, tylenol BP: (!) 141/58  HR: 95  Onset and Duration: Gradual, Date of onset: 2 1.5 years and Present longer than 3 months Cause of pain: cancer Severity: No change since onset, NAS-11 at its worse: 8/10, NAS-11 at its best: 5/10, NAS-11 now: 5/10 and NAS-11 on the average: 5/10 Timing: Not influenced by the time of the day Aggravating Factors: not having pain medication Alleviating Factors: Medications Associated Problems: Nausea and Vomiting  Quality of Pain: Aching, Nagging, Throbbing and Uncomfortable Previous Examinations or Tests: CT scan and MRI scan Previous Treatments: Narcotic medications  The patient comes into the clinic today with her husband as interpreter and caregiver.  The patient presents with atypical right-sided facial pain s/p removal of right jaw/mandible cancer of the head and neck.  The patient has difficulties with speech secondary to the extent of the cancer and surgery.  She has encounter several problems with medications used to control her pain and she comes into the clinics today as a consult for recommendations on medications that can be used to treat this cancer.  Today I had a very extensive conversation with the patient's husband and caregiver, where he has provided me with background information on her medications and side effects as well as his care towards his wife and her ongoing condition.  According to the PMP she has been prescribed liquid morphine sulfate 100 mg/5 mL  Today I took the time to provide the patient with information regarding my pain practice. The patient was informed that my practice is divided into two sections: an interventional pain  management section, as well as a completely  separate and distinct medication management section. I explained that I have procedure days for my interventional therapies, and evaluation days for follow-ups and medication management. Because of the amount of documentation required during both, they are kept separated. This means that there is the possibility that she may be scheduled for a procedure on one day, and medication management the next. I have also informed her that because of staffing and facility limitations, I no longer take patients for medication management only. To illustrate the reasons for this, I gave the patient the example of surgeons, and how inappropriate it would be to refer a patient to his/her care, just to write for the post-surgical antibiotics on a surgery done by a different surgeon.   Because interventional pain management is my board-certified specialty, the patient was informed that joining my practice means that they are open to any and all interventional therapies. I made it clear that this does not mean that they will be forced to have any procedures done. What this means is that I believe interventional therapies to be essential part of the diagnosis and proper management of chronic pain conditions. Therefore, patients not interested in these interventional alternatives will be better served under the care of a different practitioner.  The patient was also made aware of my Comprehensive Pain Management Safety Guidelines where by joining my practice, they limit all of their nerve blocks and joint injections to those done by our practice, for as long as we are retained to manage their care.   Historic Controlled Substance Pharmacotherapy Review  PMP and historical list of controlled substances: Morphine sulfate 100 mg per 5 mL; Duragesic 12.5 mcg/h patch; Duragesic 25 mcg/h patch; Duragesic 50 mcg/h patch. Current opioid analgesics:  Morphine sulfate 100 mg per 5 mL MME/day: 30 mg/day  Historical Monitoring: The  patient  reports no history of drug use. List of all UDS Test(s): No results found. List of other Serum/Urine Drug Screening Test(s):  No results found. Historical Background Evaluation: [place] PMP: PDMP reviewed during this encounter. Online review of the past 65-monthperiod conducted.             PMP NARX Score Report:  Narcotic: 420 Sedative: 150 Stimulant: 000 [place] Department of public safety, offender search: (Editor, commissioningInformation) Non-contributory Risk Assessment Profile: Aberrant behavior: None observed or detected today Risk factors for fatal opioid overdose: None identified today PMP NARX Overdose Risk Score: 180 Fatal overdose hazard ratio (HR): Calculation deferred Non-fatal overdose hazard ratio (HR): Calculation deferred Risk of opioid abuse or dependence: 0.7-3.0% with doses ? 36 MME/day and 6.1-26% with doses ? 120 MME/day. Substance use disorder (SUD) risk level: See below Personal History of Substance Abuse (SUD-Substance use disorder):  Alcohol: Negative  Illegal Drugs: Negative  Rx Drugs: Negative  ORT Risk Level calculation: Low Risk  Opioid Risk Tool - 03/29/20 1436      Family History of Substance Abuse   Alcohol Negative    Illegal Drugs Negative    Rx Drugs Negative      Personal History of Substance Abuse   Alcohol Negative    Illegal Drugs Negative    Rx Drugs Negative      Age   Age between 121-45years  No      Psychological Disease   Psychological Disease Negative    Depression Negative      Total Score   Opioid Risk Tool Scoring 0    Opioid Risk  Interpretation Low Risk          ORT Scoring interpretation table:  Score <3 = Low Risk for SUD  Score between 4-7 = Moderate Risk for SUD  Score >8 = High Risk for Opioid Abuse   PHQ-2 Depression Scale:  Total score:    PHQ-2 Scoring interpretation table: (Score and probability of major depressive disorder)  Score 0 = No depression  Score 1 = 15.4% Probability  Score 2 = 21.1% Probability   Score 3 = 38.4% Probability  Score 4 = 45.5% Probability  Score 5 = 56.4% Probability  Score 6 = 78.6% Probability   PHQ-9 Depression Scale:  Total score:    PHQ-9 Scoring interpretation table:  Score 0-4 = No depression  Score 5-9 = Mild depression  Score 10-14 = Moderate depression  Score 15-19 = Moderately severe depression  Score 20-27 = Severe depression (2.4 times higher risk of SUD and 2.89 times higher risk of overuse)   Pharmacologic Plan: As per protocol, I have not taken over any controlled substance management, pending the results of ordered tests and/or consults.            Initial impression: Pending review of available data and ordered tests.  Meds   Current Outpatient Medications:  .  acetaminophen (TYLENOL) 500 MG tablet, Take by mouth., Disp: , Rfl:  .  bacitracin ointment, APPLY TO AFFECTED AREA TWICE A DAY, Disp: , Rfl:  .  cholestyramine (QUESTRAN) 4 GM/DOSE powder, Take 1 packet (4 g total) by mouth 3 (three) times daily with meals. (Patient taking differently: Place 4 g into feeding tube 3 (three) times daily with meals. ), Disp: 378 g, Rfl: 12 .  clotrimazole-betamethasone (LOTRISONE) cream, APPLY TO AFFECTED AREA TWICE A DAY, Disp: , Rfl:  .  Ensure Plus (ENSURE PLUS) LIQD, Give 1 carton of ensure plus 5 times daily via feeding tube.  Flush with 55m of water before and after feeding., Disp: 1185 mL, Rfl: 0 .  hydrOXYzine (ATARAX/VISTARIL) 10 MG tablet, Place 10 mg into feeding tube 2 (two) times daily as needed. , Disp: , Rfl:  .  mirtazapine (REMERON) 15 MG tablet, Take 1 tablet (15 mg total) by mouth at bedtime. Crush and administer via feeding tube., Disp: 30 tablet, Rfl: 2 .  Morphine Sulfate (MORPHINE CONCENTRATE) 10 mg / 0.5 ml concentrated solution, Place 0.25 mLs (5 mg total) into feeding tube every 4 (four) hours as needed for moderate pain or severe pain., Disp: 30 mL, Rfl: 0 .  NARCAN 4 MG/0.1ML LIQD nasal spray kit, USE 1 SPRAY PER INTRANASAL ROUTE  AS DIRECTED, Disp: , Rfl: 0 .  pravastatin (PRAVACHOL) 10 MG tablet, Place 10 mg into feeding tube daily. , Disp: , Rfl:  .  prochlorperazine (COMPAZINE) 10 MG tablet, Place 1 tablet (10 mg total) into feeding tube every 6 (six) hours as needed for nausea or vomiting., Disp: 90 tablet, Rfl: 2 No current facility-administered medications for this visit.  Imaging Review  Ankle Imaging: Ankle-R DG Complete: Results for orders placed during the Perez encounter of 08/07/17 DG Ankle Complete Right  Narrative CLINICAL DATA:  79year old who fell while getting out of her car earlier today after receiving radiation therapy for head neck cancer (squamous cell carcinoma), sustaining a right ankle injury. Pain and swelling. Initial encounter.  EXAM: RIGHT ANKLE - COMPLETE 3+ VIEW  COMPARISON:  None.  FINDINGS: Avulsion fracture fragment between the lateral malleolus and the lateral talus, which I believe arises from  the talus. Small avulsion fracture involving the medial malleolus. No fractures elsewhere. Ankle mortise intact with well-preserved joint space. Osseous demineralization. Joint effusion/hemarthrosis. Diffuse soft tissue swelling, worst laterally.  IMPRESSION: 1. Avulsion fracture involving the lateral aspect of the talus at the insertion of the talofibular ligament. 2. Avulsion fracture involving the medial malleolus. 3. Ankle mortise intact.  No evidence of subluxation. 4. Osseous demineralization.   Electronically Signed By: Evangeline Dakin M.D. On: 08/07/2017 11:39  Ankle-L DG Complete: Results for orders placed during the Perez encounter of 08/07/17 DG Ankle Complete Left  Narrative CLINICAL DATA:  Left ankle pain and swelling after fall today.  EXAM: LEFT ANKLE COMPLETE - 3+ VIEW  COMPARISON:  None.  FINDINGS: There is severe posterior dislocation of talus relative to distal tibia, with severely displaced fractures involving the distal fibula and  posterior malleolus. No definite soft tissue abnormality is noted.  IMPRESSION: Severe posterior dislocation of talus relative to distal tibia, with severely displaced fractures of the distal fibula and posterior malleolus.   Electronically Signed By: Marijo Conception, M.D. On: 08/07/2017 11:38  Complexity Note: Imaging results reviewed. Results shared with Dana Perez, using Layman's terms.                        ROS  Cardiovascular: No reported cardiovascular signs or symptoms such as High blood pressure, coronary artery disease, abnormal heart rate or rhythm, heart attack, blood thinner therapy or heart weakness and/or failure Pulmonary or Respiratory: Smoking Neurological: No reported neurological signs or symptoms such as seizures, abnormal skin sensations, urinary and/or fecal incontinence, being born with an abnormal open spine and/or a tethered spinal cord Psychological-Psychiatric: No reported psychological or psychiatric signs or symptoms such as difficulty sleeping, anxiety, depression, delusions or hallucinations (schizophrenial), mood swings (bipolar disorders) or suicidal ideations or attempts Gastrointestinal: No reported gastrointestinal signs or symptoms such as vomiting or evacuating blood, reflux, heartburn, alternating episodes of diarrhea and constipation, inflamed or scarred liver, or pancreas or irrregular and/or infrequent bowel movements Genitourinary: Kidney disease Hematological: No reported hematological signs or symptoms such as prolonged bleeding, low or poor functioning platelets, bruising or bleeding easily, hereditary bleeding problems, low energy levels due to low hemoglobin or being anemic Endocrine: No reported endocrine signs or symptoms such as high or low blood sugar, rapid heart rate due to high thyroid levels, obesity or weight gain due to slow thyroid or thyroid disease Rheumatologic: No reported rheumatological signs and symptoms such as fatigue,  joint pain, tenderness, swelling, redness, heat, stiffness, decreased range of motion, with or without associated rash Musculoskeletal: Negative for myasthenia gravis, muscular dystrophy, multiple sclerosis or malignant hyperthermia Work History: Retired  Allergies  Dana Perez is allergic to potassium, other, and tetanus antitoxin.  Laboratory Chemistry Profile   Renal Lab Results  Component Value Date   BUN 38 (H) 03/22/2020   CREATININE 0.84 03/22/2020   GFRAA >60 03/22/2020   GFRNONAA >60 03/22/2020   PROTEINUR NEGATIVE 05/16/2019     Electrolytes Lab Results  Component Value Date   NA 138 03/22/2020   K 4.0 03/22/2020   CL 105 03/22/2020   CALCIUM 9.0 03/22/2020   MG 2.4 06/07/2019     Hepatic Lab Results  Component Value Date   AST 32 05/16/2019   ALT 25 05/16/2019   ALBUMIN 3.7 05/16/2019   ALKPHOS 91 05/16/2019     ID Lab Results  Component Value Date   [place] NEGATIVE 05/16/2019  STAPHAUREUS NEGATIVE 08/07/2017   MRSAPCR NEGATIVE 08/07/2017     Bone No results found for: VD25OH, BP102HE5IDP, OE4235TI1, WE3154MG8, 25OHVITD1, 25OHVITD2, 25OHVITD3, TESTOFREE, TESTOSTERONE   Endocrine Lab Results  Component Value Date   GLUCOSE 127 (H) 03/22/2020   GLUCOSEU NEGATIVE 05/16/2019   TSH 1.190 06/07/2019     Neuropathy No results found for: VITAMINB12, FOLATE, HGBA1C, HIV   CNS No results found for: COLORCSF, APPEARCSF, RBCCOUNTCSF, WBCCSF, POLYSCSF, LYMPHSCSF, EOSCSF, PROTEINCSF, GLUCCSF, JCVIRUS, CSFOLI, IGGCSF, LABACHR, ACETBL, LABACHR, ACETBL   Inflammation (CRP: Acute  ESR: Chronic) Lab Results  Component Value Date   LATICACIDVEN 1.5 11/15/2017     Rheumatology No results found for: RF, ANA, LABURIC, URICUR, LYMEIGGIGMAB, LYMEABIGMQN, HLAB27   Coagulation Lab Results  Component Value Date   INR 0.9 05/16/2019   LABPROT 11.8 05/16/2019   APTT <24 (L) 05/16/2019   PLT 259 03/22/2020     Cardiovascular Lab Results  Component  Value Date   BNP 460.0 (H) 11/15/2017   TROPONINI 0.04 (HH) 11/15/2017   HGB 15.9 (H) 03/22/2020   HCT 46.2 (H) 03/22/2020     Screening Lab Results  Component Value Date   SARSCOV2NAA NEGATIVE 05/16/2019   STAPHAUREUS NEGATIVE 08/07/2017   MRSAPCR NEGATIVE 08/07/2017     Cancer No results found for: CEA, CA125, LABCA2   Allergens No results found for: ALMOND, APPLE, ASPARAGUS, AVOCADO, BANANA, BARLEY, BASIL, BAYLEAF, GREENBEAN, LIMABEAN, WHITEBEAN, BEEFIGE, REDBEET, BLUEBERRY, BROCCOLI, CABBAGE, MELON, CARROT, CASEIN, CASHEWNUT, CAULIFLOWER, CELERY     Note: Lab results reviewed.  PFSH  Drug: Dana Perez  reports no history of drug use. Alcohol:  reports no history of alcohol use. Tobacco:  reports that she quit smoking about 2 years ago. Her smoking use included cigarettes. She smoked 0.25 packs per day. She has never used smokeless tobacco. Medical:  has a past medical history of Acute hypoxemic respiratory failure ([place]) (11/15/2017), Acute respiratory failure ([place]) (11/15/2017), Adenocarcinoma of colon ([place]), Benign neoplasm of ascending colon, CAP (community acquired pneumonia) (11/15/2017), Essential hypertension (06/13/2014), Goals of care, counseling/discussion (07/14/2017), Healthcare-associated pneumonia, HLD (hyperlipidemia) (06/13/2014), Hypercholesteremia, Hyperlipidemia, Melena, Multiple thyroid nodules (01/22/2012), Osteoporosis, Polycythemia, secondary (12/26/2014), Pure hypercholesterolemia (03/15/2015), Squamous cell carcinoma of head and neck ([place]), and Thyroid nodule. Family: family history includes Diabetes in her father; Heart disease in her mother.  Past Surgical History:  Procedure Laterality Date  . ABDOMINAL HYSTERECTOMY    . COLONOSCOPY WITH PROPOFOL N/A 07/02/2017   Procedure: COLONOSCOPY WITH PROPOFOL;  Surgeon: Lucilla Lame, MD;  Location: [facility];  Service: Endoscopy;  Laterality: N/A;  . ESOPHAGOGASTRODUODENOSCOPY N/A 05/17/2019   Procedure:  ESOPHAGOGASTRODUODENOSCOPY (EGD);  Surgeon: Lin Landsman, MD;  [facility] END[facility]COPY;  Service: Gastroenterology;  Laterality: N/A;  . ORIF ANKLE FRACTURE Left 08/08/2017   Procedure: OPEN REDUCTION INTERNAL FIXATION (ORIF) ANKLE FRACTURE;  Surgeon: Dereck Leep, MD;  Location: [facility];  Service: Orthopedics;  Laterality: Left;  . POLYPECTOMY N/A 07/02/2017   Procedure: POLYPECTOMY;  Surgeon: Lucilla Lame, MD;  Location: [facility];  Service: Endoscopy;  Laterality: N/A;  . WRIST FRACTURE SURGERY  08/2009   badly broken   Active Ambulatory Problems    Diagnosis Date Noted  . Multiple thyroid nodules 01/22/2012  . Polycythemia, secondary 12/26/2014  . HLD (hyperlipidemia) 06/13/2014  . Pure hypercholesterolemia 03/15/2015  . Erythrocytosis 07/17/2015  . Hyperlipidemia, unspecified 06/13/2014  . Essential hypertension 06/13/2014  . Squamous cell carcinoma of head and neck ([place]) 07/01/2017  . Adenocarcinoma of colon ([place])   .  Goals of care, counseling/discussion 07/14/2017  . Closed left ankle fracture 08/07/2017  . Malnutrition of moderate degree 08/10/2017  . Oral candidiasis 11/15/2017  . Trimalleolar fracture of ankle, closed, left, with routine healing, subsequent encounter 09/27/2017  . Cancer associated pain 12/31/2017  . CKD (chronic kidney disease) stage 3, GFR 30-59 ml/min 04/26/2017  . Thyroid nodule 12/14/2014  . Skin lesion 05/18/2018  . Protein-calorie malnutrition, severe 05/18/2019  . Chronic pain syndrome 03/29/2020  . Pharmacologic therapy 03/29/2020  . Disorder of skeletal system 03/29/2020  . Problems influencing health status 03/29/2020   Resolved Ambulatory Problems    Diagnosis Date Noted  . Essential (primary) hypertension 06/13/2014  . Benign neoplasm of ascending colon   . CAP (community acquired pneumonia) 11/15/2017  . Acute hypoxemic respiratory failure ([place]) 11/15/2017  . Acute respiratory failure ([place]) 11/15/2017  .  Healthcare-associated pneumonia   . Nausea and vomiting 02/03/2018  . Gastrostomy malfunction ([place]) 05/18/2018  . Complication of gastrostomy tube ([place]) 09/03/2018  . Melena    Past Medical History:  Diagnosis Date  . Hypercholesteremia   . Hyperlipidemia   . Osteoporosis    Constitutional Exam  General appearance: Well nourished, well developed, and well hydrated. In no apparent acute distress Vitals:   03/29/20 1426  BP: (!) 141/58  Pulse: 95  Resp: 16  Temp: (!) 97.2 F (36.2 C)  TempSrc: Temporal  SpO2: 96%  Weight: 125 lb (56.7 kg)  Height: 5' 7"  (1.702 m)   BMI Assessment: Estimated body mass index is 19.58 kg/m as calculated from the following:   Height as of this encounter: 5' 7"  (1.702 m).   Weight as of this encounter: 125 lb (56.7 kg).  BMI interpretation table: BMI level Category Range association with higher incidence of chronic pain  <18 kg/m2 Underweight   18.5-24.9 kg/m2 Ideal body weight   25-29.9 kg/m2 Overweight Increased incidence by 20%  30-34.9 kg/m2 Obese (Class I) Increased incidence by 68%  35-39.9 kg/m2 Severe obesity (Class II) Increased incidence by 136%  >40 kg/m2 Extreme obesity (Class III) Increased incidence by 254%   Patient's current BMI Ideal Body weight  Body mass index is 19.58 kg/m. Ideal body weight: 61.6 kg (135 lb 12.9 oz)   BMI Readings from Last 4 Encounters:  03/29/20 19.58 kg/m  03/22/20 20.00 kg/m  12/12/19 18.57 kg/m  09/12/19 18.44 kg/m   Wt Readings from Last 4 Encounters:  03/29/20 125 lb (56.7 kg)  03/22/20 125 lb 12.8 oz (57.1 kg)  12/12/19 116 lb 12.8 oz (53 kg)  09/12/19 116 lb (52.6 kg)   Assessment  Primary Diagnosis & Pertinent Problem List: The primary encounter diagnosis was Cancer associated pain. Diagnoses of Chronic pain syndrome, Squamous cell carcinoma of head and neck (HCC), Chronic face pain (Right), Right-sided face pain, Atypical face pain, Maxillary pain (Right), Mandibular pain (Right),  Mouth pain, Pharmacologic therapy, Disorder of skeletal system, and Problems influencing health status were also pertinent to this visit.  Visit Diagnosis (New problems to examiner): 1. Cancer associated pain   2. Chronic pain syndrome   3. Squamous cell carcinoma of head and neck (HCC)   4. Chronic face pain (Right)   5. Right-sided face pain   6. Atypical face pain   7. Maxillary pain (Right)   8. Mandibular pain (Right)   9. Mouth pain   10. Pharmacologic therapy   11. Disorder of skeletal system   12. Problems influencing health status    Plan of Care (Initial workup  plan)  Note: Dana Perez was reminded that as per protocol, today's visit has been an evaluation only. We have not taken over the patient's controlled substance management.  Problem-specific plan: No problem-specific Assessment & Plan notes found for this encounter.  Lab Orders  No laboratory test(s) ordered today   Imaging Orders  No imaging studies ordered today   Referral Orders  No referral(s) requested today   Procedure Orders    No procedure(s) ordered today   Pharmacotherapy (current): Medications ordered:  Meds ordered this encounter  Medications  . Buprenorphine (BUTRANS) 7.5 MCG/HR    Sig: Place 1 patch onto the skin once a week for 14 days.    Dispense:  4 patch    Refill:  0    Cancer Pain.   Medications administered during this visit: Yoselyn M. Perez had no medications administered during this visit.   Interventional management options: Dana Perez was informed that there is no guarantee that she would be a candidate for interventional therapies. The decision will be based on the results of diagnostic studies, as well as Dana Perez's risk profile.  Procedure(s) under consideration:  None at this time.   Provider-requested follow-up: Return in about 2 weeks (around 04/12/2020) for (20-min), (F2F), on E/M day.  Future Appointments  Date Time Provider [place]   06/25/2020 11:30 AM [facility] None  06/26/2020 10:00 AM [facility]  06/28/2020 10:00 AM [name], MD [facility] None  06/28/2020 10:30 AM Jennet Maduro, RD [facility] None  06/28/2020 11:00 AM Borders, Kirt Boys, NP [facility] None    Note by: Gaspar Cola, MD Date: 03/29/2020; Time: 3:13 PM

## 2020-03-29 NOTE — Patient Instructions (Signed)
____________________________________________________________________________________________  Drug Holidays (Slow)  What is a "Drug Holiday"? Drug Holiday: is the name given to the period of time during which a patient stops taking a medication(s) for the purpose of eliminating tolerance to the drug.  Benefits . Improved effectiveness of opioids. . Decreased opioid dose needed to achieve benefits. . Improved pain with lesser dose.  What is tolerance? Tolerance: is the progressive decreased in effectiveness of a drug due to its repetitive use. With repetitive use, the body gets use to the medication and as a consequence, it loses its effectiveness. This is a common problem seen with opioid pain medications. As a result, a larger dose of the drug is needed to achieve the same effect that used to be obtained with a smaller dose.  How long should a "Drug Holiday" last? You should stay off of the pain medicine for at least 14 consecutive days. (2 weeks)  Should I stop the medicine "cold turkey"? No. You should always coordinate with your Pain Specialist so that he/she can provide you with the correct medication dose to make the transition as smoothly as possible.  How do I stop the medicine? Slowly. You will be instructed to decrease the daily amount of pills that you take by one (1) pill every seven (7) days. This is called a "slow downward taper" of your dose. For example: if you normally take four (4) pills per day, you will be asked to drop this dose to three (3) pills per day for seven (7) days, then to two (2) pills per day for seven (7) days, then to one (1) per day for seven (7) days, and at the end of those last seven (7) days, this is when the "Drug Holiday" would start.   Will I have withdrawals? By doing a "slow downward taper" like this one, it is unlikely that you will experience any significant withdrawal symptoms. Typically, what triggers withdrawals is the sudden stop of a high  dose opioid therapy. Withdrawals can usually be avoided by slowly decreasing the dose over a prolonged period of time. If you do not follow these instructions and decide to stop your medication abruptly, withdrawals may be possible.  What are withdrawals? Withdrawals: refers to the wide range of symptoms that occur after stopping or dramatically reducing opiate drugs after heavy and prolonged use. Withdrawal symptoms do not occur to patients that use low dose opioids, or those who take the medication sporadically. Contrary to benzodiazepine (example: Valium, Xanax, etc.) or alcohol withdrawals ("Delirium Tremens"), opioid withdrawals are not lethal. Withdrawals are the physical manifestation of the body getting rid of the excess receptors.  Expected Symptoms Early symptoms of withdrawal may include: . Agitation . Anxiety . Muscle aches . Increased tearing . Insomnia . Runny nose . Sweating . Yawning  Late symptoms of withdrawal may include: . Abdominal cramping . Diarrhea . Dilated pupils . Goose bumps . Nausea . Vomiting  Will I experience withdrawals? Due to the slow nature of the taper, it is very unlikely that you will experience any.  What is a slow taper? Taper: refers to the gradual decrease in dose.  (Last update: 03/07/2020) ____________________________________________________________________________________________    ____________________________________________________________________________________________  Medication Rules  Purpose: To inform patients, and their family members, of our rules and regulations.  Applies to: All patients receiving prescriptions (written or electronic).  Pharmacy of record: Pharmacy where electronic prescriptions will be sent. If written prescriptions are taken to a different pharmacy, please inform the nursing staff. The pharmacy   listed in the electronic medical record should be the one where you would like electronic prescriptions  to be sent.  Electronic prescriptions: In compliance with the [place] Strengthen Opioid Misuse Prevention (STOP) Act of 2017 (Session Law 2017-74/H243), effective August 18, 2018, all controlled substances must be electronically prescribed. Calling prescriptions to the pharmacy will cease to exist.  Prescription refills: Only during scheduled appointments. Applies to all prescriptions.  NOTE: The following applies primarily to controlled substances (Opioid* Pain Medications).   Type of encounter (visit): For patients receiving controlled substances, face-to-face visits are required. (Not an option or up to the patient.)  Patient's responsibilities: 1. Pain Pills: Bring all pain pills to every appointment (except for procedure appointments). 2. Pill Bottles: Bring pills in original pharmacy bottle. Always bring the newest bottle. Bring bottle, even if empty. 3. Medication refills: You are responsible for knowing and keeping track of what medications you take and those you need refilled. The day before your appointment: write a list of all prescriptions that need to be refilled. The day of the appointment: give the list to the admitting nurse. Prescriptions will be written only during appointments. No prescriptions will be written on procedure days. If you forget a medication: it will not be "Called in", "Faxed", or "electronically sent". You will need to get another appointment to get these prescribed. No early refills. Do not call asking to have your prescription filled early. 4. Prescription Accuracy: You are responsible for carefully inspecting your prescriptions before leaving our office. Have the discharge nurse carefully go over each prescription with you, before taking them home. Make sure that your name is accurately spelled, that your address is correct. Check the name and dose of your medication to make sure it is accurate. Check the number of pills, and the written instructions to  make sure they are clear and accurate. Make sure that you are given enough medication to last until your next medication refill appointment. 5. Taking Medication: Take medication as prescribed. When it comes to controlled substances, taking less pills or less frequently than prescribed is permitted and encouraged. Never take more pills than instructed. Never take medication more frequently than prescribed.  6. Inform other Doctors: Always inform, all of your healthcare providers, of all the medications you take. 7. Pain Medication from other Providers: You are not allowed to accept any additional pain medication from any other Doctor or Healthcare provider. There are two exceptions to this rule. (see below) In the event that you require additional pain medication, you are responsible for notifying us, as stated below. 8. Medication Agreement: You are responsible for carefully reading and following our Medication Agreement. This must be signed before receiving any prescriptions from our practice. Safely store a copy of your signed Agreement. Violations to the Agreement will result in no further prescriptions. (Additional copies of our Medication Agreement are available upon request.) 9. Laws, Rules, & Regulations: All patients are expected to follow all Federal and State Laws, Statutes, Rules, & Regulations. Ignorance of the Laws does not constitute a valid excuse.  10. Illegal drugs and Controlled Substances: The use of illegal substances (including, but not limited to marijuana and its derivatives) and/or the illegal use of any controlled substances is strictly prohibited. Violation of this rule may result in the immediate and permanent discontinuation of any and all prescriptions being written by our practice. The use of any illegal substances is prohibited. 11. Adopted CDC guidelines & recommendations: Target dosing levels will be at or   below 60 MME/day. Use of benzodiazepines** is not  recommended.  Exceptions: There are only two exceptions to the rule of not receiving pain medications from other Healthcare Providers. 1. Exception #1 (Emergencies): In the event of an emergency (i.e.: accident requiring emergency care), you are allowed to receive additional pain medication. However, you are responsible for: As soon as you are able, call our office [phone], at any time of the day or night, and leave a message stating your name, the date and nature of the emergency, and the name and dose of the medication prescribed. In the event that your call is answered by a member of our staff, make sure to document and save the date, time, and the name of the person that took your information.  2. Exception #2 (Planned Surgery): In the event that you are scheduled by another doctor or dentist to have any type of surgery or procedure, you are allowed (for a period no longer than 30 days), to receive additional pain medication, for the acute post-op pain. However, in this case, you are responsible for picking up a copy of our "Post-op Pain Management for Surgeons" handout, and giving it to your surgeon or dentist. This document is available at our office, and does not require an appointment to obtain it. Simply go to our office during business hours (Monday-Thursday from 8:00 AM to 4:00 PM) (Friday 8:00 AM to 12:00 Noon) or if you have a scheduled appointment with us, prior to your surgery, and ask for it by name. In addition, you will need to provide us with your name, name of your surgeon, type of surgery, and date of procedure or surgery.  *Opioid medications include: morphine, codeine, oxycodone, oxymorphone, hydrocodone, hydromorphone, meperidine, tramadol, tapentadol, buprenorphine, fentanyl, methadone. **Benzodiazepine medications include: diazepam (Valium), alprazolam (Xanax), clonazepam (Klonopine), lorazepam (Ativan), clorazepate (Tranxene), chlordiazepoxide (Librium), estazolam (Prosom),  oxazepam (Serax), temazepam (Restoril), triazolam (Halcion) (Last updated: 10/15/2017) ____________________________________________________________________________________________   ____________________________________________________________________________________________  Medication Recommendations and Reminders  Applies to: All patients receiving prescriptions (written and/or electronic).  Medication Rules & Regulations: These rules and regulations exist for your safety and that of others. They are not flexible and neither are we. Dismissing or ignoring them will be considered "non-compliance" with medication therapy, resulting in complete and irreversible termination of such therapy. (See document titled "Medication Rules" for more details.) In all conscience, because of safety reasons, we cannot continue providing a therapy where the patient does not follow instructions.  Pharmacy of record:   Definition: This is the pharmacy where your electronic prescriptions will be sent.   We do not endorse any particular pharmacy, however, we have experienced problems with [facility] not securing enough medication supply for the community.  We do not restrict you in your choice of pharmacy. However, once we write for your prescriptions, we will NOT be re-sending more prescriptions to fix restricted supply problems created by your pharmacy, or your insurance.   The pharmacy listed in the electronic medical record should be the one where you want electronic prescriptions to be sent.  If you choose to change pharmacy, simply notify our nursing staff.  Recommendations:  Keep all of your pain medications in a safe place, under lock and key, even if you live alone. We will NOT replace lost, stolen, or damaged medication.  After you fill your prescription, take 1 week's worth of pills and put them away in a safe place. You should keep a separate, properly labeled bottle for this purpose. The remainder    should be kept in the original bottle. Use this as your primary supply, until it runs out. Once it's gone, then you know that you have 1 week's worth of medicine, and it is time to come in for a prescription refill. If you do this correctly, it is unlikely that you will ever run out of medicine.  To make sure that the above recommendation works, it is very important that you make sure your medication refill appointments are scheduled at least 1 week before you run out of medicine. To do this in an effective manner, make sure that you do not leave the office without scheduling your next medication management appointment. Always ask the nursing staff to show you in your prescription , when your medication will be running out. Then arrange for the receptionist to get you a return appointment, at least 7 days before you run out of medicine. Do not wait until you have 1 or 2 pills left, to come in. This is very poor planning and does not take into consideration that we may need to cancel appointments due to bad weather, sickness, or emergencies affecting our staff.  DO NOT ACCEPT A "Partial Fill": If for any reason your pharmacy does not have enough pills/tablets to completely fill or refill your prescription, do not allow for a "partial fill". The law allows the pharmacy to complete that prescription within 72 hours, without requiring a new prescription. If they do not fill the rest of your prescription within those 72 hours, you will need a separate prescription to fill the remaining amount, which we will NOT provide. If the reason for the partial fill is your insurance, you will need to talk to the pharmacist about payment alternatives for the remaining tablets, but again, DO NOT ACCEPT A PARTIAL FILL, unless you can trust your pharmacist to obtain the remainder of the pills within 72 hours.  Prescription refills and/or changes in medication(s):   Prescription refills, and/or changes in dose or medication,  will be conducted only during scheduled medication management appointments. (Applies to both, written and electronic prescriptions.)  No refills on procedure days. No medication will be changed or started on procedure days. No changes, adjustments, and/or refills will be conducted on a procedure day. Doing so will interfere with the diagnostic portion of the procedure.  No phone refills. No medications will be "called into the pharmacy".  No Fax refills.  No weekend refills.  No Holliday refills.  No after hours refills.  Remember:  Business hours are:  Monday to Thursday 8:00 AM to 4:00 PM Provider's Schedule: Kyion Gautier, MD - Appointments are:  Medication management: Monday and Wednesday 8:00 AM to 4:00 PM Procedure day: Tuesday and Thursday 7:30 AM to 4:00 PM Bilal Lateef, MD - Appointments are:  Medication management: Tuesday and Thursday 8:00 AM to 4:00 PM Procedure day: Monday and Wednesday 7:30 AM to 4:00 PM (Last update: 03/07/2020) ____________________________________________________________________________________________   ____________________________________________________________________________________________  CBD (cannabidiol) WARNING  Applicable to: All individuals currently taking or considering taking CBD (cannabidiol) and, more important, all patients taking opioid analgesic controlled substances (pain medication). (Example: oxycodone; oxymorphone; hydrocodone; hydromorphone; morphine; methadone; tramadol; tapentadol; fentanyl; buprenorphine; butorphanol; dextromethorphan; meperidine; codeine; etc.)  Legal status: CBD remains a Schedule I drug prohibited for any use. CBD is illegal with one exception. In the [place], CBD has a limited Food and Drug Administration (FDA) approval for the treatment of two specific types of epilepsy disorders. Only one CBD product has been approved by the   FDA for this purpose: "Epidiolex". FDA is aware that some  companies are marketing products containing cannabis and cannabis-derived compounds in ways that violate the Federal Food, Drug and Cosmetic Act (FD&C Act) and that may put the health and safety of consumers at risk. The FDA, a Federal agency, has not enforced the CBD status since 2018.   Legality: Some manufacturers ship CBD products nationally, which is illegal. Often such products are sold online and are therefore available throughout the country. CBD is openly sold in head shops and health food stores in some states where such sales have not been explicitly legalized. Selling unapproved products with unsubstantiated therapeutic claims is not only a violation of the law, but also can put patients at risk, as these products have not been proven to be safe or effective. Federal illegality makes it difficult to conduct research on CBD.  Reference: "FDA Regulation of Cannabis and Cannabis-Derived Products, Including Cannabidiol (CBD)" - https://www.fda.gov/news-events/public-health-focus/fda-regulation-cannabis-and-cannabis-derived-products-including-cannabidiol-cbd  Warning: CBD is not FDA approved and has not undergo the same manufacturing controls as prescription drugs.  This means that the purity and safety of available CBD may be questionable. Most of the time, despite manufacturer's claims, it is contaminated with THC (delta-9-tetrahydrocannabinol - the chemical in marijuana responsible for the "HIGH").  When this is the case, the THC contaminant will trigger a positive urine drug screen (UDS) test for Marijuana (carboxy-THC). Because a positive UDS for any illicit substance is a violation of our medication agreement, your opioid analgesics (pain medicine) may be permanently discontinued.  MORE ABOUT CBD  General Information: CBD  is a derivative of the Marijuana (cannabis sativa) plant discovered in 1940. It is one of the 113 identified substances found in Marijuana. It accounts for up to 40% of the  plant's extract. As of 2018, preliminary clinical studies on CBD included research for the treatment of anxiety, movement disorders, and pain. CBD is available and consumed in multiple forms, including inhalation of smoke or vapor, as an aerosol spray, and by mouth. It may be supplied as an oil containing CBD, capsules, dried cannabis, or as a liquid solution. CBD is thought not to be as psychoactive as THC (delta-9-tetrahydrocannabinol - the chemical in marijuana responsible for the "HIGH"). Studies suggest that CBD may interact with different biological target receptors in the body, including cannabinoid and other neurotransmitter receptors. As of 2018 the mechanism of action for its biological effects has not been determined.  Side-effects  Adverse reactions: Dry mouth, diarrhea, decreased appetite, fatigue, drowsiness, malaise, weakness, sleep disturbances, and others.  Drug interactions: CBC may interact with other medications such as blood-thinners. (Last update: 03/24/2020) ____________________________________________________________________________________________    

## 2020-03-29 NOTE — Progress Notes (Signed)
Safety precautions to be maintained throughout the outpatient stay will include: orient to surroundings, keep bed in low position, maintain call bell within reach at all times, provide assistance with transfer out of bed and ambulation.  

## 2020-03-30 ENCOUNTER — Telehealth: Payer: Self-pay | Admitting: Pain Medicine

## 2020-03-30 ENCOUNTER — Other Ambulatory Visit: Payer: Self-pay | Admitting: Pain Medicine

## 2020-03-30 MED ORDER — BUPRENORPHINE 7.5 MCG/HR TD PTWK: 1.0000 | MEDICATED_PATCH | TRANSDERMAL | 0 refills | Status: DC

## 2020-03-30 NOTE — Telephone Encounter (Signed)
Husband called stating he went to the pharmacy to pick up patches Dr. Dossie Arbour prescribed yesterday, (only 2 patches). Pharmacy states it comes 4 to a box and cannot be distributed any other way. Please advise patient. He states wife is almost out of morphine and they need to know if he should call cancer center to refill morphine. Or if Dr. Dossie Arbour will prescribe 4 patches so he can go pick them up.

## 2020-03-30 NOTE — Telephone Encounter (Signed)
Called [facility] [phone] and did receive PA for butrans patches.  Called to [facility] in [place] to let them know that this has been approved through 08/17/20.  Dana Perez states that he will have to order and they will let the patient's husband know that they will have the medication by Monday for pickup.

## 2020-03-30 NOTE — Telephone Encounter (Signed)
Sent to Dr Dossie Arbour via bubble.

## 2020-04-04 ENCOUNTER — Telehealth: Payer: Self-pay | Admitting: *Deleted

## 2020-04-04 ENCOUNTER — Telehealth: Payer: Self-pay | Admitting: Pain Medicine

## 2020-04-04 NOTE — Telephone Encounter (Signed)
Patient started Buprenorphine patch on Sunday night, Monday after having first feeding thru tube she started vomiting and having severe diarrhea  Should she stop patch or what should she do? Please call asap

## 2020-04-04 NOTE — Telephone Encounter (Signed)
Dana Perez called reporting that he took [facility] to [facility] and she saw Dr Lowella Dandy who put her on a patch for pain and she has developed severe diarrhea as well as vomiting every time he feeds her. He called the Pain clinic and Dr Delane Ginger is not there today and he was told unless she has severe diarrhea that he needs to wait a week for her body to acclimate to the patch. He states  That he removed her patch as he was NOT going to put her through that and states that she needs a refill of the MS. He said to call him if you want to discuss this. Please advise

## 2020-04-04 NOTE — Telephone Encounter (Signed)
Spoke back with patient's husband, he does feel that N/V 4 times per day and diarrhea 5 times/day is too severe.  I did ask if there is morphine left over and he states there is probably 2 doses left.  He was told by Dr Dossie Arbour to not give these in comination.  He has been giving her compazine tid and it is not helping.  Asked if he should call cancer center to get another Rx for morphine.  I asked him to please hold off until we spoke with DR Holley Raring again.  Patient verbalizes u/o and I will call him back as soon as I talk to Dr Holley Raring.

## 2020-04-04 NOTE — Telephone Encounter (Signed)
Spoke back with Dana Perez.  Patient continues to have severe N/V/D.  He does not feel that he can give the patches any more time to hope that they improve.  He is going to remove the patch.  He is going to call the cancer center and see if he can get the Morphine prescribed again.  I did tell him that if he would like Dr Holley Raring can see patient tomorrow for F2F for other options. [facility] verbalizes u/o information.

## 2020-04-04 NOTE — Telephone Encounter (Signed)
Talked to Dana Perez and Dana Perez continues to have episodes of diarrhea after meals via tube feeds.  Pain relief is good in her face but she still has the generalized pain all over.  I will talk to Dr Holley Raring since Dr Dossie Arbour is not in office and see what his reommendation is.

## 2020-04-05 ENCOUNTER — Other Ambulatory Visit: Payer: Self-pay | Admitting: Hospice and Palliative Medicine

## 2020-04-05 MED ORDER — MORPHINE SULFATE (CONCENTRATE) 10 MG /0.5 ML PO SOLN: 5.0000 mg | ORAL | 0 refills | Status: DC | PRN

## 2020-04-05 NOTE — Progress Notes (Signed)
Patient's husband called in to office requesting refill of morphine elixir. It appears she was started on buprenorphine patch but he says this has been removed due to side effects. Will refill but plan to speak with Dr. Dossie Arbour to coordinate pain regimen going forward.

## 2020-04-16 DIAGNOSIS — H903 Sensorineural hearing loss, bilateral: Secondary | ICD-10-CM | POA: Diagnosis not present

## 2020-04-16 DIAGNOSIS — H6121 Impacted cerumen, right ear: Secondary | ICD-10-CM | POA: Diagnosis not present

## 2020-04-16 DIAGNOSIS — C148 Malignant neoplasm of overlapping sites of lip, oral cavity and pharynx: Secondary | ICD-10-CM | POA: Diagnosis not present

## 2020-04-16 DIAGNOSIS — R682 Dry mouth, unspecified: Secondary | ICD-10-CM | POA: Diagnosis not present

## 2020-04-16 DIAGNOSIS — L299 Pruritus, unspecified: Secondary | ICD-10-CM | POA: Diagnosis not present

## 2020-04-22 DIAGNOSIS — R519 Headache, unspecified: Secondary | ICD-10-CM | POA: Insufficient documentation

## 2020-04-22 DIAGNOSIS — K1379 Other lesions of oral mucosa: Secondary | ICD-10-CM | POA: Insufficient documentation

## 2020-04-22 DIAGNOSIS — C189 Malignant neoplasm of colon, unspecified: Secondary | ICD-10-CM | POA: Diagnosis not present

## 2020-04-22 DIAGNOSIS — C760 Malignant neoplasm of head, face and neck: Secondary | ICD-10-CM | POA: Diagnosis not present

## 2020-04-22 DIAGNOSIS — G501 Atypical facial pain: Secondary | ICD-10-CM | POA: Insufficient documentation

## 2020-04-22 DIAGNOSIS — R6884 Jaw pain: Secondary | ICD-10-CM | POA: Insufficient documentation

## 2020-04-26 DIAGNOSIS — K9420 Gastrostomy complication, unspecified: Secondary | ICD-10-CM | POA: Diagnosis not present

## 2020-04-30 DIAGNOSIS — Z931 Gastrostomy status: Secondary | ICD-10-CM | POA: Diagnosis not present

## 2020-04-30 DIAGNOSIS — J449 Chronic obstructive pulmonary disease, unspecified: Secondary | ICD-10-CM | POA: Insufficient documentation

## 2020-04-30 DIAGNOSIS — C760 Malignant neoplasm of head, face and neck: Secondary | ICD-10-CM | POA: Diagnosis not present

## 2020-04-30 DIAGNOSIS — R5381 Other malaise: Secondary | ICD-10-CM | POA: Diagnosis not present

## 2020-04-30 DIAGNOSIS — E440 Moderate protein-calorie malnutrition: Secondary | ICD-10-CM | POA: Diagnosis not present

## 2020-04-30 DIAGNOSIS — R5383 Other fatigue: Secondary | ICD-10-CM | POA: Diagnosis not present

## 2020-04-30 DIAGNOSIS — I10 Essential (primary) hypertension: Secondary | ICD-10-CM | POA: Diagnosis not present

## 2020-04-30 DIAGNOSIS — Z23 Encounter for immunization: Secondary | ICD-10-CM | POA: Diagnosis not present

## 2020-04-30 DIAGNOSIS — G893 Neoplasm related pain (acute) (chronic): Secondary | ICD-10-CM | POA: Diagnosis not present

## 2020-04-30 DIAGNOSIS — I700 Atherosclerosis of aorta: Secondary | ICD-10-CM | POA: Diagnosis not present

## 2020-04-30 DIAGNOSIS — E7800 Pure hypercholesterolemia, unspecified: Secondary | ICD-10-CM | POA: Diagnosis not present

## 2020-04-30 DIAGNOSIS — N1831 Chronic kidney disease, stage 3a: Secondary | ICD-10-CM | POA: Diagnosis not present

## 2020-05-02 ENCOUNTER — Telehealth: Payer: Self-pay | Admitting: *Deleted

## 2020-05-02 MED ORDER — FENTANYL 12 MCG/HR TD PT72: 1.0000 | MEDICATED_PATCH | TRANSDERMAL | 0 refills | Status: DC

## 2020-05-02 NOTE — Telephone Encounter (Signed)
I called and spoke with patient's husband. He reports that patient has had chronic pain requiring frequent dosing of morphine/acetaminophen. He says pain is really unchanged but he would like to restart the transdermal fentanyl to see if it better controls patient's symptoms. Discussed then using morphine elixir and acetaminophen PRN for BTP. Continue bowel regimen. Will schedule a MyChart visit in 2-3 weeks to evaluate pain.   Note that patient is no longer taking the Butrans.   Plan: -Start fentanyl patch 12.70mcg Q72H, #10 -Continue morphine elixir as needed for breakthrough pain

## 2020-05-02 NOTE — Telephone Encounter (Signed)
Mr Blakeney called asking if Monisha is to retry Fentanyl patches or not. He states that the current medicine she has is only lasting an hour, then "she as to suffer for 2 hours". Also asking about a morphine that is time released. Questions if she is to retry Fentanyl does she continue the Tylenol and MS. Please return his call to discuss her pain management.

## 2020-05-04 ENCOUNTER — Other Ambulatory Visit: Payer: Self-pay | Admitting: *Deleted

## 2020-05-04 MED ORDER — MORPHINE SULFATE (CONCENTRATE) 10 MG /0.5 ML PO SOLN
5.0000 mg | ORAL | 0 refills | Status: DC | PRN
Start: 2020-05-04 — End: 2020-06-01

## 2020-05-09 ENCOUNTER — Other Ambulatory Visit: Payer: Self-pay | Admitting: Oncology

## 2020-05-10 ENCOUNTER — Other Ambulatory Visit: Payer: Self-pay | Admitting: *Deleted

## 2020-05-16 ENCOUNTER — Telehealth: Payer: Self-pay | Admitting: *Deleted

## 2020-05-16 ENCOUNTER — Other Ambulatory Visit: Payer: Self-pay | Admitting: *Deleted

## 2020-05-16 MED ORDER — FENTANYL 25 MCG/HR TD PT72: 1.0000 | MEDICATED_PATCH | TRANSDERMAL | 0 refills | Status: DC

## 2020-05-16 NOTE — Telephone Encounter (Signed)
I called and spoke with patients husband, he states 12 mcg patch is not enough and is requesting increase in patch at this time. RX for 25 mcg fentanyl patch sent to Dr. Grayland Ormond for approval.

## 2020-05-16 NOTE — Telephone Encounter (Signed)
Patients husband called to report that the fentanyl patch was not working. Her pain is not any better.

## 2020-05-22 DIAGNOSIS — C189 Malignant neoplasm of colon, unspecified: Secondary | ICD-10-CM | POA: Diagnosis not present

## 2020-05-22 DIAGNOSIS — C760 Malignant neoplasm of head, face and neck: Secondary | ICD-10-CM | POA: Diagnosis not present

## 2020-06-01 ENCOUNTER — Other Ambulatory Visit: Payer: Self-pay | Admitting: *Deleted

## 2020-06-01 MED ORDER — MORPHINE SULFATE (CONCENTRATE) 10 MG /0.5 ML PO SOLN
5.0000 mg | ORAL | 0 refills | Status: DC | PRN
Start: 2020-06-01 — End: 2020-06-04

## 2020-06-04 ENCOUNTER — Other Ambulatory Visit: Payer: Self-pay | Admitting: *Deleted

## 2020-06-04 MED ORDER — MORPHINE SULFATE (CONCENTRATE) 10 MG /0.5 ML PO SOLN
5.0000 mg | ORAL | 0 refills | Status: DC | PRN
Start: 2020-06-04 — End: 2020-06-28

## 2020-06-12 ENCOUNTER — Other Ambulatory Visit: Payer: Self-pay | Admitting: *Deleted

## 2020-06-12 MED ORDER — FENTANYL 37.5 MCG/HR TD PT72
1.0000 | MEDICATED_PATCH | TRANSDERMAL | 0 refills | Status: DC
Start: 2020-06-12 — End: 2020-06-28

## 2020-06-12 NOTE — Telephone Encounter (Signed)
Mr Brazill called requesting for increase in Dana Perez's Fentanyl patch to 37.5 mcg. He currently has 2 more fentanyl 25 patches which he will change her patch today, but wants to higher dose for her next change. Please advise

## 2020-06-18 DIAGNOSIS — C760 Malignant neoplasm of head, face and neck: Secondary | ICD-10-CM | POA: Diagnosis not present

## 2020-06-18 DIAGNOSIS — C189 Malignant neoplasm of colon, unspecified: Secondary | ICD-10-CM | POA: Diagnosis not present

## 2020-06-22 ENCOUNTER — Other Ambulatory Visit: Payer: Self-pay | Admitting: *Deleted

## 2020-06-22 DIAGNOSIS — C760 Malignant neoplasm of head, face and neck: Secondary | ICD-10-CM

## 2020-06-24 NOTE — Progress Notes (Signed)
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  INTERVAL HISTORY: Patient returns to clinic today for routine 62-monthevaluation and discussion of her imaging results.  She continues to have worsening pain as well as chronic weakness and fatigue.  Her husband answers most of the questions and he reports no other complaints.  She continues to require 100% of nutrition via her PEG tube and is now gaining weight. She has no neurologic complaints.  She denies any recent fevers or illnesses.  She denies any chest pain, shortness of breath, cough, or hemoptysis.  She has no nausea, vomiting, constipation, or diarrhea.  She has no urinary complaints.  Patient offers no further specific complaints today.  REVIEW OF SYSTEMS:   Review of Systems  Constitutional: Positive for malaise/fatigue. Negative for fever and weight loss.  HENT: Negative.  Negative for sore throat.   Respiratory: Negative.  Negative for cough, shortness of breath and stridor.   Cardiovascular: Negative.  Negative for chest pain and leg swelling.  Gastrointestinal: Negative.  Negative for abdominal pain, constipation, diarrhea, nausea and vomiting.  Genitourinary: Negative.  Negative for dysuria.  Musculoskeletal: Positive for neck pain. Negative for falls and joint pain.  Skin: Negative.  Negative for itching and rash.  Neurological: Positive for weakness. Negative for sensory change, focal weakness and headaches.  Psychiatric/Behavioral: Positive for memory loss. Negative for depression and hallucinations. The patient  is not nervous/anxious and does not have insomnia.     As per HPI. Otherwise, a complete review of systems is negative.  PAST MEDICAL HISTORY: Past Medical History:  Diagnosis Date  . Acute hypoxemic respiratory failure ([facility] 11/15/2017  . Acute respiratory failure ([facility] 11/15/2017  . [place]   . Benign neoplasm of ascending colon   . CAP (community acquired pneumonia) 11/15/2017  . Essential hypertension 06/13/2014  . Goals of care, counseling/discussion 07/14/2017  . Healthcare-associated pneumonia   . HLD (hyperlipidemia) 06/13/2014  . Hypercholesteremia   . Hyperlipidemia   . Melena   . Multiple thyroid nodules 01/22/2012  . Osteoporosis   . Polycythemia, secondary 12/26/2014  . Pure hypercholesterolemia 03/15/2015  . Squamous cell carcinoma of head and neck ([facility]   . Thyroid nodule     PAST SURGICAL HISTORY: Past Surgical History:  Procedure Laterality Date  . ABDOMINAL HYSTERECTOMY    . COLONOSCOPY WITH PROPOFOL N/A 07/02/2017   Procedure: COLONOSCOPY WITH PROPOFOL;  Surgeon: WLucilla Lame MD;  [facility]  Service: Endoscopy;  Laterality: N/A;  . ESOPHAGOGASTRODUODENOSCOPY N/A 05/17/2019   Procedure: ESOPHAGOGASTRODUODENOSCOPY (EGD);  Surgeon: VLin Landsman MD;  [facility];  Service: Gastroenterology;  Laterality: N/A;  . ORIF ANKLE FRACTURE Left 08/08/2017   Procedure: OPEN REDUCTION INTERNAL FIXATION (ORIF) ANKLE FRACTURE;  Surgeon: HDereck Leep MD;  Location: [facility] OR[facility];  Service: Orthopedics;  Laterality: Left;  . POLYPECTOMY N/A 07/02/2017   Procedure: POLYPECTOMY;  Surgeon: WLucilla Lame MD;  [facility] [facility]  Service: Endoscopy;  Laterality: N/A;  . WRIST FRACTURE SURGERY  08/2009   badly broken    FAMILY HISTORY Family History  Problem Relation Age of Onset  . Heart  disease Mother   . Diabetes Father        ADVANCED DIRECTIVES:    HEALTH MAINTENANCE: Social History   Tobacco  Use  . Smoking status: Former Smoker    Packs/day: 0.25    Types: Cigarettes    Quit date: 06/17/2017    Years since quitting: 3.0  . Smokeless tobacco: Never Used  Vaping Use  . Vaping Use: Never used  Substance Use Topics  . Alcohol use: No  . Drug use: No     Allergies  Allergen Reactions  . Buprenorphine Nausea Only and Nausea And Vomiting  . Potassium Nausea Only and Nausea And Vomiting  . Other Other (See Comments) and Rash    TDAP tdap TDAP  . Tetanus Antitoxin Rash    Current Outpatient Medications  Medication Sig Dispense Refill  . acetaminophen (TYLENOL) 500 MG tablet Take by mouth.    . bacitracin ointment APPLY TO AFFECTED AREA TWICE A DAY    . cholestyramine (QUESTRAN) 4 GM/DOSE powder Take 1 packet (4 g total) by mouth 3 (three) times daily with meals. (Patient taking differently: Place 4 g into feeding tube 3 (three) times daily with meals. ) 378 g 12  . clotrimazole-betamethasone (LOTRISONE) cream APPLY TO AFFECTED AREA TWICE A DAY    . Ensure Plus (ENSURE PLUS) LIQD Give 1 carton of ensure plus 5 times daily via feeding tube.  Flush with 62m of water before and after feeding. 1185 mL 0  . hydrOXYzine (ATARAX/VISTARIL) 10 MG tablet Place 10 mg into feeding tube 2 (two) times daily as needed.     . mirtazapine (REMERON) 15 MG tablet Take 1 tablet (15 mg total) by mouth at bedtime. Crush and administer via feeding tube. 30 tablet 2  . NARCAN 4 MG/0.1ML LIQD nasal spray kit USE 1 SPRAY PER INTRANASAL ROUTE AS DIRECTED  0  . pravastatin (PRAVACHOL) 10 MG tablet Place 10 mg into feeding tube daily.     . prochlorperazine (COMPAZINE) 10 MG tablet PLACE 1 TABLET (10 MG TOTAL) INTO FEEDING TUBE EVERY 6 (SIX) HOURS AS NEEDED FOR NAUSEA OR VOMITING. 60 tablet 2  . fentaNYL (DURAGESIC) 50 MCG/HR Place 1 patch onto the skin every 3 (three) days. 10 patch 0  . Morphine Sulfate (MORPHINE CONCENTRATE) 10 mg / 0.5 ml concentrated solution Place 0.25 mLs (5 mg total) into  feeding tube every 4 (four) hours as needed for moderate pain or severe pain. 30 mL 0  . ondansetron (ZOFRAN) 8 MG tablet Place 1 tablet (8 mg total) into feeding tube every 8 (eight) hours as needed for nausea or vomiting. 45 tablet 0   No current facility-administered medications for this visit.   Facility-Administered Medications Ordered in Other Visits  Medication Dose Route Frequency Provider Last Rate Last Admin  . heparin lock flush 100 unit/mL  250 Units Intracatheter PRN PLeia Alf MD      . heparin lock flush 100 unit/mL  500 Units Intracatheter PRN PLeia Alf MD      . potassium chloride 20 mEq in sodium chloride 0.9 % 500 mL injection   Intravenous Once STye Savoy Doni H, RN      . sodium chloride 0.9 % injection 10 mL  10 mL Intracatheter PRN PLeia Alf MD      . sodium chloride 0.9 % injection 10 mL  10 mL Intracatheter PRN PLeia Alf MD      . sodium chloride 0.9 % injection 10 mL  10 mL Intracatheter PRN Pandit,  Sandeep, MD      . sodium chloride 0.9 % injection 10 mL  10 mL Intracatheter PRN Leia Alf, MD      -year-old  OBJECTIVE: Vitals:   06/28/20 1005  BP: 96/76  Pulse: 95  Temp: 97.6 F (36.4 C)     Body mass index is 21.14 kg/m.    ECOG FS:2 - Symptomatic, <50% confined to bed  General: Thin, no acute distress.  Sitting in a wheelchair. Eyes: Pink conjunctiva, anicteric sclera. HEENT: Normocephalic, moist mucous membranes. Lungs: No audible wheezing or coughing. Heart: Regular rate and rhythm. Abdomen: Soft, nontender, no obvious distention. Musculoskeletal: No edema, cyanosis, or clubbing. Neuro: Alert, answering all questions appropriately. Cranial nerves grossly intact. Skin: No rashes or petechiae noted. Psych: Normal affect.   LAB RESULTS:  Lab Results  Component Value Date   NA 136 06/25/2020   K 4.3 06/25/2020   CL 105 06/25/2020   CO2 22 06/25/2020   GLUCOSE 121 (H) 06/25/2020   BUN 35 (H) 06/25/2020    CREATININE 0.97 06/25/2020   CALCIUM 9.0 06/25/2020   PROT 7.4 06/25/2020   ALBUMIN 3.7 06/25/2020   AST 27 06/25/2020   ALT 21 06/25/2020   ALKPHOS 98 06/25/2020   BILITOT 0.4 06/25/2020   GFRNONAA >60 06/25/2020   GFRAA >60 03/22/2020    Lab Results  Component Value Date   WBC 8.9 06/25/2020   NEUTROABS 5.8 06/25/2020   HGB 16.0 (H) 06/25/2020   HCT 47.6 (H) 06/25/2020   MCV 92.6 06/25/2020   PLT 295 06/25/2020     STUDIES: CT SOFT TISSUE NECK W CONTRAST  Result Date: 06/26/2020 CLINICAL DATA:  Follow-up head and neck cancer. Squamous cell carcinoma. Surveillance imaging. EXAM: CT NECK WITH CONTRAST TECHNIQUE: Multidetector CT imaging of the neck was performed using the standard protocol following the bolus administration of intravenous contrast. Not utilized. Stenosis is probably in the range of 50% on both sides. Diminutive right jugular vein. CONTRAST:  160m OMNIPAQUE IOHEXOL 300 MG/ML  SOLN COMPARISON:  12/07/2019.  06/07/2019.  05/08/2017. FINDINGS: Pharynx and larynx: No mucosal or submucosal lesion. Salivary glands: Left submandibular gland is diminutive but normal. Right submandibular gland surgically absent. Parotid glands are normal. Thyroid: Normal Lymph nodes: No residual or recurrent lymphadenopathy. Previous right mandibular resection with regional flap reconstruction. Similar appearance of the soft tissues of the region without evidence of recurrent mass. Vascular: Bilateral carotid atherosclerotic disease at the bifurcations. CT angiographic technique was not utilized. Stenosis estimated at approximately 50% on both sides. Diminutive right jugular vein. Limited intracranial: Negative Visualized orbits: Negative Mastoids and visualized paranasal sinuses: Clear Skeleton: Ordinary cervical spondylosis. Right mandibular resection as noted above. No malignant bone findings. Upper chest: See results of chest CT. 1.5 cm density right upper lobe. Other: None IMPRESSION: 1.  Previous right mandibular resection with regional flap reconstruction. Similar appearance of the soft tissues of the region without evidence of recurrent mass. No residual or recurrent lymphadenopathy. 2. Bilateral carotid atherosclerotic disease. Stenosis estimated at approximately 50% on both sides. CT angiographic technique was not utilized. 3. 1.5 cm density right upper lobe.  See results of chest CT. Electronically Signed   By: MNelson ChimesM.D.   On: 06/26/2020 11:20   CT CHEST ABDOMEN PELVIS W CONTRAST  Result Date: 06/26/2020 CLINICAL DATA:  Metastatic head and neck cancer, history of colon cancer EXAM: CT CHEST, ABDOMEN, AND PELVIS WITH CONTRAST TECHNIQUE: Multidetector CT imaging of the chest, abdomen and pelvis was performed following the  standard protocol during bolus administration of intravenous contrast. CONTRAST:  150m OMNIPAQUE IOHEXOL 300 MG/ML SOLN, additional oral enteric contrast COMPARISON:  12/07/2019 FINDINGS: CT CHEST FINDINGS Cardiovascular: Aortic atherosclerosis. Normal heart size. Left and right coronary artery calcifications. Small pericardial effusion, unchanged. Mediastinum/Nodes: No enlarged mediastinal, hilar, or axillary lymph nodes. Thyroid gland, trachea, and esophagus demonstrate no significant findings. Lungs/Pleura: Severe centrilobular emphysema. There are multiple new bilateral pulmonary nodules, in the peripheral right apex measuring 1.5 x 1.4 cm (series 8, image 24), in the anterior right middle lobe measuring 1.8 x 1.2 cm (series 8, image 84) and in the superior segment left lower lobe measuring 2.2 x 1.7 cm (series 8, image 82). No pleural effusion or pneumothorax. Musculoskeletal: No chest wall mass or suspicious bone lesions identified. CT ABDOMEN PELVIS FINDINGS Hepatobiliary: No solid liver abnormality is seen. No gallstones, gallbladder wall thickening, or biliary dilatation. Pancreas: Unremarkable. No pancreatic ductal dilatation or surrounding inflammatory  changes. Spleen: Normal in size without significant abnormality. Adrenals/Urinary Tract: Adrenal glands are unremarkable. Kidneys are normal, without renal calculi, solid lesion, or hydronephrosis. Bladder is unremarkable. Stomach/Bowel: Percutaneous gastrostomy. Stomach is otherwise within normal limits. Appendix is not clearly visualized. No evidence of bowel wall thickening, distention, or inflammatory changes. Vascular/Lymphatic: No significant vascular findings are present. No enlarged abdominal or pelvic lymph nodes. Reproductive: Status post hysterectomy. Other: No abdominal wall hernia or abnormality. No abdominopelvic ascites. Musculoskeletal: No acute or significant osseous findings. IMPRESSION: 1. There are multiple new bilateral pulmonary nodules, most consistent with new pulmonary metastatic disease although conceivably infectious or inflammatory in nature. Consider tissue sampling. 2. No evidence of metastatic disease within the abdomen or pelvis. 3. Emphysema (ICD10-J43.9). 4. Small, unchanged pericardial effusion. 5. Coronary artery disease.  Aortic Atherosclerosis (ICD10-I70.0). Electronically Signed   By: AEddie CandleM.D.   On: 06/26/2020 15:57    ASSESSMENT: Recurrent, progressive stage IVa head and neck squamous cell carcinoma, adenocarcinoma of the colon.  PLAN:    1.  Recurrent, progressive stage IVa head and neck squamous cell carcinoma: Patient's last infusion of nivolumab was on November 11, 2018.  CT scan results from 06/26/2020 reviewed independently and report as above with likely metastatic recurrence in patient's lungs.  We had lengthy discussion of next steps including reinitiating treatment, continued observation and repeat CT scan in 3 months, and hospice care.  Husband was unclear of what they wanted to do and plans to call clinic with their decision.  No follow-up was scheduled at this time.  Appreciate palliative care input.   2.  Diarrhea: Patient does not complain of this  today.  Intermittent.  Continue Imodium and Lomotil as needed.   3.  Decreased performance status: Likely multifactorial.  Appreciate palliative care input.   4.  Nausea/vomiting: Patient does not complain of this today.  Continue Phenergan as needed. 5.  Pain: Increase fentanyl patch.  Appreciate palliative care input. 6.  PEG tube: Continue tube feeds as per dietary.  Appreciate dietary input.  I spent a total of 30 minutes reviewing chart data, face-to-face evaluation with the patient, counseling and coordination of care as detailed above.   Patient and her husband expressed understanding that they can call or return to clinic at any time if they have any questions, concerns, or complaints.  TLloyd Huger MD 06/29/20 8:39 AM

## 2020-06-25 ENCOUNTER — Inpatient Hospital Stay: Payer: PPO | Attending: Oncology

## 2020-06-25 ENCOUNTER — Other Ambulatory Visit: Payer: Self-pay

## 2020-06-25 DIAGNOSIS — R531 Weakness: Secondary | ICD-10-CM | POA: Diagnosis not present

## 2020-06-25 DIAGNOSIS — R5383 Other fatigue: Secondary | ICD-10-CM | POA: Diagnosis not present

## 2020-06-25 DIAGNOSIS — Z90710 Acquired absence of both cervix and uterus: Secondary | ICD-10-CM | POA: Diagnosis not present

## 2020-06-25 DIAGNOSIS — M542 Cervicalgia: Secondary | ICD-10-CM | POA: Diagnosis not present

## 2020-06-25 DIAGNOSIS — C760 Malignant neoplasm of head, face and neck: Secondary | ICD-10-CM | POA: Insufficient documentation

## 2020-06-25 DIAGNOSIS — Z931 Gastrostomy status: Secondary | ICD-10-CM | POA: Diagnosis not present

## 2020-06-25 DIAGNOSIS — Z86010 Personal history of colonic polyps: Secondary | ICD-10-CM | POA: Diagnosis not present

## 2020-06-25 DIAGNOSIS — Z87891 Personal history of nicotine dependence: Secondary | ICD-10-CM | POA: Insufficient documentation

## 2020-06-25 LAB — COMPREHENSIVE METABOLIC PANEL
ALT: 21 U/L (ref 0–44)
AST: 27 U/L (ref 15–41)
Albumin: 3.7 g/dL (ref 3.5–5.0)
Alkaline Phosphatase: 98 U/L (ref 38–126)
Anion gap: 9 (ref 5–15)
BUN: 35 mg/dL — ABNORMAL HIGH (ref 8–23)
CO2: 22 mmol/L (ref 22–32)
Calcium: 9 mg/dL (ref 8.9–10.3)
Chloride: 105 mmol/L (ref 98–111)
Creatinine, Ser: 0.97 mg/dL (ref 0.44–1.00)
GFR, Estimated: >60 mL/min (ref >60)
Glucose, Bld: 121 mg/dL — ABNORMAL HIGH (ref 70–99)
Potassium: 4.3 mmol/L (ref 3.5–5.1)
Sodium: 136 mmol/L (ref 135–145)
Total Bilirubin: 0.4 mg/dL (ref 0.3–1.2)
Total Protein: 7.4 g/dL (ref 6.5–8.1)

## 2020-06-25 LAB — CBC WITH DIFFERENTIAL/PLATELET
Abs Immature Granulocytes: 0.02 10*3/uL (ref 0.00–0.07)
Basophils Absolute: 0 10*3/uL (ref 0.0–0.1)
Basophils Relative: 0 %
Eosinophils Absolute: 0.2 10*3/uL (ref 0.0–0.5)
Eosinophils Relative: 2 %
HCT: 47.6 % — ABNORMAL HIGH (ref 36.0–46.0)
Hemoglobin: 16 g/dL — ABNORMAL HIGH (ref 12.0–15.0)
Immature Granulocytes: 0 %
Lymphocytes Relative: 26 %
Lymphs Abs: 2.3 10*3/uL (ref 0.7–4.0)
MCH: 31.1 pg (ref 26.0–34.0)
MCHC: 33.6 g/dL (ref 30.0–36.0)
MCV: 92.6 fL (ref 80.0–100.0)
Monocytes Absolute: 0.6 10*3/uL (ref 0.1–1.0)
Monocytes Relative: 7 %
Neutro Abs: 5.8 10*3/uL (ref 1.7–7.7)
Neutrophils Relative %: 65 %
Platelets: 295 10*3/uL (ref 150–400)
RBC: 5.14 MIL/uL — ABNORMAL HIGH (ref 3.87–5.11)
RDW: 12.1 % (ref 11.5–15.5)
WBC: 8.9 10*3/uL (ref 4.0–10.5)
nRBC: 0 % (ref 0.0–0.2)

## 2020-06-26 ENCOUNTER — Ambulatory Visit
Admission: RE | Admit: 2020-06-26 | Discharge: 2020-06-26 | Disposition: A | Discharge status: Home / Self Care | Payer: PPO | Source: Ambulatory Visit | Attending: Oncology | Admitting: Oncology

## 2020-06-26 DIAGNOSIS — I2510 Atherosclerotic heart disease of native coronary artery without angina pectoris: Secondary | ICD-10-CM | POA: Diagnosis not present

## 2020-06-26 DIAGNOSIS — I313 Pericardial effusion (noninflammatory): Secondary | ICD-10-CM | POA: Diagnosis not present

## 2020-06-26 DIAGNOSIS — Z85038 Personal history of other malignant neoplasm of large intestine: Secondary | ICD-10-CM | POA: Diagnosis not present

## 2020-06-26 DIAGNOSIS — C760 Malignant neoplasm of head, face and neck: Secondary | ICD-10-CM

## 2020-06-26 DIAGNOSIS — J432 Centrilobular emphysema: Secondary | ICD-10-CM | POA: Diagnosis not present

## 2020-06-26 DIAGNOSIS — I700 Atherosclerosis of aorta: Secondary | ICD-10-CM | POA: Diagnosis not present

## 2020-06-26 MED ORDER — IOHEXOL 300 MG/ML  SOLN
100.0000 mL | Freq: Once | INTRAMUSCULAR | Status: AC | PRN
  Administered 2020-06-26: 100 mL via INTRAVENOUS

## 2020-06-28 ENCOUNTER — Inpatient Hospital Stay (HOSPITAL_BASED_OUTPATIENT_CLINIC_OR_DEPARTMENT_OTHER): Payer: PPO | Admitting: Oncology

## 2020-06-28 ENCOUNTER — Encounter: Payer: PPO | Admitting: Hospice and Palliative Medicine

## 2020-06-28 ENCOUNTER — Inpatient Hospital Stay (HOSPITAL_BASED_OUTPATIENT_CLINIC_OR_DEPARTMENT_OTHER): Payer: PPO | Admitting: Hospice and Palliative Medicine

## 2020-06-28 ENCOUNTER — Inpatient Hospital Stay: Payer: PPO

## 2020-06-28 ENCOUNTER — Other Ambulatory Visit: Payer: Self-pay

## 2020-06-28 ENCOUNTER — Ambulatory Visit: Payer: PPO | Admitting: Oncology

## 2020-06-28 VITALS — BP 96/76 | HR 95 | Temp 97.6°F | Wt 135.0 lb

## 2020-06-28 DIAGNOSIS — G893 Neoplasm related pain (acute) (chronic): Secondary | ICD-10-CM

## 2020-06-28 DIAGNOSIS — C760 Malignant neoplasm of head, face and neck: Secondary | ICD-10-CM

## 2020-06-28 DIAGNOSIS — Z515 Encounter for palliative care: Secondary | ICD-10-CM

## 2020-06-28 MED ORDER — MORPHINE SULFATE (CONCENTRATE) 10 MG /0.5 ML PO SOLN: 5.0000 mg | ORAL | 0 refills | Status: DC | PRN

## 2020-06-28 MED ORDER — FENTANYL 50 MCG/HR TD PT72
1.0000 | MEDICATED_PATCH | TRANSDERMAL | 0 refills | Status: DC
Start: 2020-06-28 — End: 2020-08-16

## 2020-06-28 MED ORDER — ONDANSETRON HCL 8 MG PO TABS: 8.0000 mg | ORAL_TABLET | Freq: Three times a day (TID) | ORAL | 0 refills | Status: DC | PRN

## 2020-06-28 NOTE — Progress Notes (Addendum)
Nutrition Follow-up:  Patient with head and neck stage IV and adenocarcinoma of colon.  Patient followed by Dr. Grayland Ormond and Palliative care.  Patient currently not receiving treatment.  PEG in place for nutrition and meeting 100% of needs.    Met with husband and patient following Palliative care visit.  Palliative discussed recent imaging results with husband and patient. Husband and patient waiting to meet with MD and will discuss options regarding next steps with new imaging results.    Husband concern about patient's weight gain.  Reports that height is actually 5 ft 4 inches.  Reports that he has been giving 5 cartons of ensure plus via feeding tube.  Reports increased nausea and vomiting, maybe 3-4 times per week, recently happen just prior to  CT scan and after (involved contrast ?? if this prompted vomiting after CT scan). Husband says this nausea could be related to pain patch as well.  Husband reports patient has had issues with nausea and vomiting in the past but seems more frequent recently.    Reports patient has constipation then diarrhea.  Gives warm prune juice that helps with constipation.     Medications: reviewed  Labs: reviewed  Anthropometrics:   Weight 135 lb today increased from 125 lb on 8/5.    116 lb on 12/12/19 111 lb 06/2019 UBW 122-124 lb IBW 130 lb based on ht of 65 inches, if 64 inches IBW 120 lb.     Estimated Energy Needs  Kcals: 1250-1500 Protein: 62-75 g Fluid: 1.5 L  NUTRITION DIAGNOSIS: Inadequate oral intake but relying on feeding tube for 100% of nutritional needs   INTERVENTION:  With new results on imaging and unsure plan at this time would not recommend making any adjustments in tube feeding due to weight gain.  Ideally, would want to optimize nutrition status as much as possible so if patient and husband decide to pursue further treatment patient is well nourished going into treatment.  Husband in agreement with current nutrition plan of  care.   Husband has nausea medications on hand. Husband has contact information     MONITORING, EVALUATION, GOAL: weight trends, tube feeding   NEXT VISIT: to be determined  Ileane Sando B. Zenia Resides, [place], [place] Registered Dietitian [phone] (mobile)

## 2020-06-28 NOTE — Progress Notes (Signed)
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 PMH also includes adenocarcinoma of the colon not currently on treatment, malnutrition status post PEG insertion, she had bilateral ankle fractures causing a marked reduction in her performance status, chronic pain on transdermal fentanyl, hypertension, hyperlipidemia, and CKD stage III. Patient had disease progression and general decline despite on previous treatment. Hospice had been discussed but patient/husband opted to continue treatment with nivolumab. Patient had good response to nivolumab.  Palliative care is now asked to follow and provide her with support.  SOCIAL HISTORY:     reports that she quit smoking about 3 years ago. Her smoking use included cigarettes. She smoked 0.25 packs per day. She has never used smokeless tobacco. She reports that she does not drink alcohol and does not use drugs.   Patient is married. She lives at home with her husband. She has no children. 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  ADVANCE DIRECTIVES:  Patient does not currently have advance directives. She would want her husband to be her decision-maker. Patient and husband have talked about completing a living will but have not currently done so.   CODE STATUS: DNR  PAST MEDICAL HISTORY: Past Medical History:  Diagnosis Date   Acute hypoxemic  respiratory failure ([place]) 11/15/2017   Acute respiratory failure ([place]) 11/15/2017   Adenocarcinoma of colon ([place])    Benign neoplasm of ascending colon    CAP (community acquired pneumonia) 11/15/2017   Essential hypertension 06/13/2014   Goals of care, counseling/discussion 07/14/2017   Healthcare-associated pneumonia    HLD (hyperlipidemia) 06/13/2014   Hypercholesteremia    Hyperlipidemia    Melena    Multiple thyroid nodules 01/22/2012   Osteoporosis    Polycythemia, secondary 12/26/2014   Pure hypercholesterolemia 03/15/2015   Squamous cell carcinoma of head and neck (HCC)    Thyroid nodule     PAST SURGICAL HISTORY:  Past Surgical History:  Procedure Laterality Date   ABDOMINAL HYSTERECTOMY     COLONOSCOPY WITH PROPOFOL N/A 07/02/2017   Procedure: COLONOSCOPY WITH PROPOFOL;  Surgeon: Lucilla Lame, MD;  Location: [facility];  Service: Endoscopy;  Laterality: N/A;   ESOPHAGOGASTRODUODENOSCOPY N/A 05/17/2019   Procedure: ESOPHAGOGASTRODUODENOSCOPY (EGD);  Surgeon: Lin Landsman, MD;  Location: [facility] ENDOSCOPY;  Service: Gastroenterology;  Laterality: N/A;   ORIF ANKLE FRACTURE Left 08/08/2017   Procedure: OPEN REDUCTION INTERNAL FIXATION (ORIF) ANKLE FRACTURE;  Surgeon: Dereck Leep, MD;  Location: [facility];  Service: Orthopedics;  Laterality: Left;   POLYPECTOMY N/A 07/02/2017   Procedure: POLYPECTOMY;  Surgeon: Lucilla Lame, MD;  Location: [facility];  Service: Endoscopy;  Laterality: N/A;   WRIST FRACTURE SURGERY  08/2009   badly broken    HEMATOLOGY/ONCOLOGY HISTORY:  Oncology History  Squamous cell carcinoma of head and neck ([place])  07/01/2017 Initial Diagnosis   Squamous cell carcinoma of head and neck ([place])   04/08/2018 -  Chemotherapy   The patient had nivolumab (OPDIVO) 240 mg in sodium chloride 0.9 %  100 mL chemo infusion, 240 mg, Intravenous, Once, 16 of 20 cycles Administration: 240 mg (04/08/2018), 240 mg (04/22/2018), 240  mg (05/06/2018), 240 mg (05/20/2018), 240 mg (06/03/2018), 240 mg (06/24/2018), 240 mg (07/08/2018), 240 mg (07/22/2018), 240 mg (08/05/2018), 240 mg (08/19/2018), 240 mg (09/02/2018), 240 mg (09/16/2018), 240 mg (09/30/2018), 240 mg (10/14/2018), 240 mg (10/28/2018), 240 mg (11/11/2018)  for chemotherapy treatment.      ALLERGIES:  is allergic to buprenorphine, potassium, other, and tetanus antitoxin.  MEDICATIONS:  Current Outpatient Medications  Medication Sig Dispense Refill   acetaminophen (TYLENOL) 500 MG tablet Take by mouth.     bacitracin ointment APPLY TO AFFECTED AREA TWICE A DAY     cholestyramine (QUESTRAN) 4 GM/DOSE powder Take 1 packet (4 g total) by mouth 3 (three) times daily with meals. (Patient taking differently: Place 4 g into feeding tube 3 (three) times daily with meals. ) 378 g 12   clotrimazole-betamethasone (LOTRISONE) cream APPLY TO AFFECTED AREA TWICE A DAY     Ensure Plus (ENSURE PLUS) LIQD Give 1 carton of ensure plus 5 times daily via feeding tube.  Flush with 66m of water before and after feeding. 1185 mL 0   hydrOXYzine (ATARAX/VISTARIL) 10 MG tablet Place 10 mg into feeding tube 2 (two) times daily as needed.      mirtazapine (REMERON) 15 MG tablet Take 1 tablet (15 mg total) by mouth at bedtime. Crush and administer via feeding tube. 30 tablet 2   Morphine Sulfate (MORPHINE CONCENTRATE) 10 mg / 0.5 ml concentrated solution Place 0.25 mLs (5 mg total) into feeding tube every 4 (four) hours as needed for moderate pain or severe pain. 30 mL 0   NARCAN 4 MG/0.1ML LIQD nasal spray kit USE 1 SPRAY PER INTRANASAL ROUTE AS DIRECTED  0   pravastatin (PRAVACHOL) 10 MG tablet Place 10 mg into feeding tube daily.      prochlorperazine (COMPAZINE) 10 MG tablet PLACE 1 TABLET (10 MG TOTAL) INTO FEEDING TUBE EVERY 6 (SIX) HOURS AS NEEDED FOR NAUSEA OR VOMITING. 60 tablet 2   No current facility-administered medications for this visit.   Facility-Administered Medications  Ordered in Other Visits  Medication Dose Route Frequency Provider Last Rate Last Admin   heparin lock flush 100 unit/mL  250 Units Intracatheter PRN PLeia Alf MD       heparin lock flush 100 unit/mL  500 Units Intracatheter PRN PLeia Alf MD       potassium chloride 20 mEq in sodium chloride 0.9 % 500 mL injection   Intravenous Once STye Savoy Doni H, RN       sodium chloride 0.9 % injection 10 mL  10 mL Intracatheter PRN PLeia Alf MD       sodium chloride 0.9 % injection 10 mL  10 mL Intracatheter PRN PLeia Alf MD       sodium chloride 0.9 % injection 10 mL  10 mL Intracatheter PRN PLeia Alf MD       sodium chloride 0.9 % injection 10 mL  10 mL Intracatheter PRN PLeia Alf MD        VITAL SIGNS: There were no vitals taken for this visit. There were no vitals filed for this visit.  Estimated body mass index is 21.14 kg/m as calculated from the following:   Height as of 03/29/20: _0  (1.702 m).   Weight as of an earlier encounter on 06/28/20: 135 lb (61.2 kg).  LABS: CBC:    Component Value Date/Time   WBC  8.9 06/25/2020 1030   HGB 16.0 (H) 06/25/2020 1030   HCT 47.6 (H) 06/25/2020 1030   PLT 295 06/25/2020 1030   MCV 92.6 06/25/2020 1030   NEUTROABS 5.8 06/25/2020 1030   LYMPHSABS 2.3 06/25/2020 1030   MONOABS 0.6 06/25/2020 1030   EOSABS 0.2 06/25/2020 1030   BASOSABS 0.0 06/25/2020 1030   Comprehensive Metabolic Panel:    Component Value Date/Time   NA 136 06/25/2020 1030   K 4.3 06/25/2020 1030   CL 105 06/25/2020 1030   CO2 22 06/25/2020 1030   BUN 35 (H) 06/25/2020 1030   CREATININE 0.97 06/25/2020 1030   GLUCOSE 121 (H) 06/25/2020 1030   CALCIUM 9.0 06/25/2020 1030   AST 27 06/25/2020 1030   ALT 21 06/25/2020 1030   ALKPHOS 98 06/25/2020 1030   BILITOT 0.4 06/25/2020 1030   PROT 7.4 06/25/2020 1030   ALBUMIN 3.7 06/25/2020 1030    RADIOGRAPHIC STUDIES: CT SOFT TISSUE NECK W CONTRAST  Result Date:  06/26/2020 CLINICAL DATA:  Follow-up head and neck cancer. Squamous cell carcinoma. Surveillance imaging. EXAM: CT NECK WITH CONTRAST TECHNIQUE: Multidetector CT imaging of the neck was performed using the standard protocol following the bolus administration of intravenous contrast. Not utilized. Stenosis is probably in the range of 50% on both sides. Diminutive right jugular vein. CONTRAST:  138m OMNIPAQUE IOHEXOL 300 MG/ML  SOLN COMPARISON:  12/07/2019.  06/07/2019.  05/08/2017. FINDINGS: Pharynx and larynx: No mucosal or submucosal lesion. Salivary glands: Left submandibular gland is diminutive but normal. Right submandibular gland surgically absent. Parotid glands are normal. Thyroid: Normal Lymph nodes: No residual or recurrent lymphadenopathy. Previous right mandibular resection with regional flap reconstruction. Similar appearance of the soft tissues of the region without evidence of recurrent mass. Vascular: Bilateral carotid atherosclerotic disease at the bifurcations. CT angiographic technique was not utilized. Stenosis estimated at approximately 50% on both sides. Diminutive right jugular vein. Limited intracranial: Negative Visualized orbits: Negative Mastoids and visualized paranasal sinuses: Clear Skeleton: Ordinary cervical spondylosis. Right mandibular resection as noted above. No malignant bone findings. Upper chest: See results of chest CT. 1.5 cm density right upper lobe. Other: None IMPRESSION: 1. Previous right mandibular resection with regional flap reconstruction. Similar appearance of the soft tissues of the region without evidence of recurrent mass. No residual or recurrent lymphadenopathy. 2. Bilateral carotid atherosclerotic disease. Stenosis estimated at approximately 50% on both sides. CT angiographic technique was not utilized. 3. 1.5 cm density right upper lobe.  See results of chest CT. Electronically Signed   By: MNelson ChimesM.D.   On: 06/26/2020 11:20   CT CHEST ABDOMEN PELVIS W  CONTRAST  Result Date: 06/26/2020 CLINICAL DATA:  Metastatic head and neck cancer, history of colon cancer EXAM: CT CHEST, ABDOMEN, AND PELVIS WITH CONTRAST TECHNIQUE: Multidetector CT imaging of the chest, abdomen and pelvis was performed following the standard protocol during bolus administration of intravenous contrast. CONTRAST:  1098mOMNIPAQUE IOHEXOL 300 MG/ML SOLN, additional oral enteric contrast COMPARISON:  12/07/2019 FINDINGS: CT CHEST FINDINGS Cardiovascular: Aortic atherosclerosis. Normal heart size. Left and right coronary artery calcifications. Small pericardial effusion, unchanged. Mediastinum/Nodes: No enlarged mediastinal, hilar, or axillary lymph nodes. Thyroid gland, trachea, and esophagus demonstrate no significant findings. Lungs/Pleura: Severe centrilobular emphysema. There are multiple new bilateral pulmonary nodules, in the peripheral right apex measuring 1.5 x 1.4 cm (series 8, image 24), in the anterior right middle lobe measuring 1.8 x 1.2 cm (series 8, image 84) and in the superior segment left lower lobe measuring  2.2 x 1.7 cm (series 8, image 82). No pleural effusion or pneumothorax. Musculoskeletal: No chest wall mass or suspicious bone lesions identified. CT ABDOMEN PELVIS FINDINGS Hepatobiliary: No solid liver abnormality is seen. No gallstones, gallbladder wall thickening, or biliary dilatation. Pancreas: Unremarkable. No pancreatic ductal dilatation or surrounding inflammatory changes. Spleen: Normal in size without significant abnormality. Adrenals/Urinary Tract: Adrenal glands are unremarkable. Kidneys are normal, without renal calculi, solid lesion, or hydronephrosis. Bladder is unremarkable. Stomach/Bowel: Percutaneous gastrostomy. Stomach is otherwise within normal limits. Appendix is not clearly visualized. No evidence of bowel wall thickening, distention, or inflammatory changes. Vascular/Lymphatic: No significant vascular findings are present. No enlarged abdominal or  pelvic lymph nodes. Reproductive: Status post hysterectomy. Other: No abdominal wall hernia or abnormality. No abdominopelvic ascites. Musculoskeletal: No acute or significant osseous findings. IMPRESSION: 1. There are multiple new bilateral pulmonary nodules, most consistent with new pulmonary metastatic disease although conceivably infectious or inflammatory in nature. Consider tissue sampling. 2. No evidence of metastatic disease within the abdomen or pelvis. 3. Emphysema (ICD10-J43.9). 4. Small, unchanged pericardial effusion. 5. Coronary artery disease.  Aortic Atherosclerosis (ICD10-I70.0). Electronically Signed   By: Eddie Candle M.D.   On: 06/26/2020 15:57    PERFORMANCE STATUS (ECOG) : 3 - Symptomatic, >50% confined to bed  Review of Systems Unless otherwise noted, a complete review of systems is negative.  Physical Exam General: NAD, frail appearing, thin, in wheelchair Pulmonary: Unlabored Abdomen: soft, nontender, PEG noted but not visualized GU: no suprapubic tenderness Extremities: no edema, no joint deformities Skin: no rashes Neurological: Weakness but otherwise nonfocal  IMPRESSION: Routine follow-up visit today.  Patient was accompanied by her husband.  Patient has been prescribed progressive generalized pain, nausea and some dyspnea over the past month.  Patient has continued to use morphine elixir several times a day as needed for pain.  Husband requests a dose increase of the transdermal fentanyl.  We discussed that her symptoms are likely related to cancer recurrence with PET scan showing new bilateral pulmonary nodules.  We discussed options for possible resumption of systemic treatment versus a focus more on supportive care.  Husband and patient want time to consider options and will let us know what they decide.  PLAN: -Patient has been considering options for treatment versus hospice -Continue morphine elixir 5 mg every 4 hours as needed for pain -Increase fentanyl  50 mcg every 72 hours -Ondansetron 8 mg 3 times daily as needed for nausea -RTC TBD  Case and plan discussed with Dr. Grayland Ormond  Patient expressed understanding and was in agreement with this plan. She also understands that She can call the clinic at any time with any questions, concerns, or complaints.    Time Total: 20 minutes  Visit consisted of counseling and education dealing with the complex and emotionally intense issues of symptom management and palliative care in the setting of serious and potentially life-threatening illness.Greater than 50%  of this time was spent counseling and coordinating care related to the above assessment and plan.  Signed by: Altha Harm, PhD, NP-C [phone] (Work Cell)

## 2020-06-28 NOTE — Progress Notes (Signed)
Patient here for results husband has multiple regarding pain management and management of N/V.

## 2020-07-02 ENCOUNTER — Telehealth: Payer: Self-pay | Admitting: *Deleted

## 2020-07-02 ENCOUNTER — Other Ambulatory Visit: Payer: Self-pay | Admitting: Oncology

## 2020-07-02 NOTE — Telephone Encounter (Signed)
Per Dr. Grayland Ormond, patient can be scheduled for lab/MD/Opdivo-first available or whenever patient would like to restart.

## 2020-07-02 NOTE — Telephone Encounter (Signed)
Pt husband has been contacted with appt date of 12/2.  [place]

## 2020-07-02 NOTE — Telephone Encounter (Signed)
Orders updated

## 2020-07-02 NOTE — Telephone Encounter (Signed)
Husband called reporting that they have decided to go ahead with the immunotherapy. He asks that appts NOT be scheduled for 11/30, 12/1, 12/9, or 3/4 as she already has physician appts these days

## 2020-07-11 ENCOUNTER — Telehealth: Payer: Self-pay | Admitting: *Deleted

## 2020-07-11 MED ORDER — HYDROCOD POLST-CPM POLST ER 10-8 MG/5ML PO SUER
5.0000 mL | Freq: Two times a day (BID) | ORAL | 0 refills | Status: AC | PRN
Start: 2020-07-11 — End: ?

## 2020-07-11 NOTE — Progress Notes (Signed)
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  INTERVAL HISTORY: Patient returns to clinic today for further evaluation and reinitiation of Opdivo.  Her husband answers most of the questions and he reports she has a chronic cough and occasional vomiting, but no other complaints.  She continues to require 100% of nutrition via her PEG tube. She has no neurologic complaints.  She denies any recent fevers or illnesses.  She denies any chest pain, shortness of breath, or hemoptysis.  She has no nausea, constipation, or diarrhea.  She has no urinary complaints.  Patient offers no further specific complaints today.  REVIEW OF SYSTEMS:   Review of Systems  Constitutional: Positive for malaise/fatigue. Negative for fever and weight loss.  HENT: Negative.  Negative for sore throat.   Respiratory: Positive for cough. Negative for shortness of breath and stridor.   Cardiovascular: Negative.  Negative for chest pain and leg swelling.  Gastrointestinal: Positive for vomiting. Negative for abdominal pain, constipation, diarrhea and nausea.  Genitourinary: Negative.  Negative for dysuria.  Musculoskeletal: Positive for neck pain. Negative for falls and joint pain.  Skin: Negative.  Negative for itching and rash.  Neurological: Positive for weakness. Negative for sensory change, focal weakness and headaches.  Psychiatric/Behavioral: Positive for memory loss. Negative for depression and hallucinations. The patient is not nervous/anxious and does not have insomnia.     As per HPI. Otherwise, a  complete review of systems is negative.  PAST MEDICAL HISTORY: Past Medical History:  Diagnosis Date  . Acute hypoxemic respiratory failure ([place]) 11/15/2017  . Acute respiratory failure ([place]) 11/15/2017  . Adenocarcinoma of colon ([place])   . Benign neoplasm of ascending colon   . CAP (community acquired pneumonia) 11/15/2017  . Essential hypertension 06/13/2014  . Goals of care, counseling/discussion 07/14/2017  . Healthcare-associated pneumonia   . HLD (hyperlipidemia) 06/13/2014  . Hypercholesteremia   . Hyperlipidemia   . Melena   . Multiple thyroid nodules 01/22/2012  . Osteoporosis   . Polycythemia, secondary 12/26/2014  . Pure hypercholesterolemia 03/15/2015  . Squamous cell carcinoma of head and neck ([place])   . Thyroid nodule     PAST SURGICAL HISTORY: Past Surgical History:  Procedure Laterality Date  . ABDOMINAL HYSTERECTOMY    . COLONOSCOPY WITH PROPOFOL N/A 07/02/2017   Procedure: COLONOSCOPY WITH PROPOFOL;  Surgeon: Lucilla Lame, MD;  Location: [place];  Service: Endoscopy;  Laterality: N/A;  . ESOPHAGOGASTRODUODENOSCOPY N/A 05/17/2019   Procedure: ESOPHAGOGASTRODUODENOSCOPY (EGD);  Surgeon: Lin Landsman, MD;  Location: [facility] ENDOSCOPY;  Service: Gastroenterology;  Laterality: N/A;  . ORIF ANKLE FRACTURE Left 08/08/2017   Procedure: OPEN REDUCTION INTERNAL FIXATION (ORIF) ANKLE FRACTURE;  Surgeon: Dereck Leep, MD;  Location: [facility];  Service: Orthopedics;  Laterality: Left;  . POLYPECTOMY N/A 07/02/2017   Procedure: POLYPECTOMY;  Surgeon: Lucilla Lame, MD;  Location: [place];  Service: Endoscopy;  Laterality: N/A;  . WRIST FRACTURE SURGERY  08/2009   badly broken    FAMILY HISTORY Family History  Problem Relation Age of Onset  . Heart disease Mother   . Diabetes Father        ADVANCED  DIRECTIVES:    HEALTH MAINTENANCE: Social History   Tobacco Use  . Smoking status: Former Smoker    Packs/day: 0.25    Types: Cigarettes     Quit date: 06/17/2017    Years since quitting: 3.0  . Smokeless tobacco: Never Used  Vaping Use  . Vaping Use: Never used  Substance Use Topics  . Alcohol use: No  . Drug use: No     Allergies  Allergen Reactions  . Buprenorphine Nausea Only and Nausea And Vomiting  . Other Other (See Comments) and Rash    TDAP tdap TDAP  . Tetanus Antitoxin Rash    No current facility-administered medications for this visit.   Current Outpatient Medications  Medication Sig Dispense Refill  . acetaminophen (TYLENOL) 500 MG tablet Take 1,000 mg by mouth 2 (two) times daily as needed. 1000, 1600    . bacitracin ointment Apply 1 application topically 2 (two) times daily as needed.     . chlorpheniramine-HYDROcodone (TUSSIONEX) 10-8 MG/5ML SUER Place 5 mLs into feeding tube every 12 (twelve) hours as needed for cough. (Patient taking differently: Place 5 mLs into feeding tube every 12 (twelve) hours as needed for cough. 0700, 1900) 140 mL 0  . cholestyramine (QUESTRAN) 4 GM/DOSE powder Take 1 packet (4 g total) by mouth 3 (three) times daily with meals. (Patient not taking: Reported on 07/19/2020) 378 g 12  . clotrimazole-betamethasone (LOTRISONE) cream Apply to when changing dressing    . Ensure Plus (ENSURE PLUS) LIQD Give 1 carton of ensure plus 5 times daily via feeding tube.  Flush with 28m of water before and after feeding. 1185 mL 0  . fentaNYL (DURAGESIC) 50 MCG/HR Place 1 patch onto the skin every 3 (three) days. 10 patch 0  . hydrOXYzine (ATARAX/VISTARIL) 10 MG tablet Place 10 mg into feeding tube 2 (two) times daily as needed.     . mirtazapine (REMERON) 15 MG tablet Take 1 tablet (15 mg total) by mouth at bedtime. Crush and administer via feeding tube. 30 tablet 2  . Morphine Sulfate (MORPHINE CONCENTRATE) 10 mg / 0.5 ml concentrated solution Place 0.25 mLs (5 mg total) into feeding tube every 4 (four) hours as needed for moderate pain or severe pain. (Patient taking differently: Place 5 mg  into feeding tube every 4 (four) hours as needed for moderate pain or severe pain. 0700, 1300, 1900) 30 mL 0  . NARCAN 4 MG/0.1ML LIQD nasal spray kit USE 1 SPRAY PER INTRANASAL ROUTE AS DIRECTED  0  . ondansetron (ZOFRAN) 8 MG tablet Place 1 tablet (8 mg total) into feeding tube every 8 (eight) hours as needed for nausea or vomiting. 45 tablet 0  . pravastatin (PRAVACHOL) 10 MG tablet Place 10 mg into feeding tube daily.     . prochlorperazine (COMPAZINE) 10 MG tablet PLACE 1 TABLET (10 MG TOTAL) INTO FEEDING TUBE EVERY 6 (SIX) HOURS AS NEEDED FOR NAUSEA OR VOMITING. 60 tablet 2   Facility-Administered Medications Ordered in Other Visits  Medication Dose Route Frequency Provider Last Rate Last Admin  . 0.9 %  sodium chloride infusion  250 mL Intravenous PRN Agbata, Tochukwu, MD      . acetaminophen (TYLENOL) tablet 650 mg  650 mg Oral Q6H PRN Agbata, Tochukwu, MD      . cefTRIAXone (ROCEPHIN) 1 g in sodium chloride 0.9 % 100 mL IVPB  1 g Intravenous Q24H ACollier Bullock MD   Stopped at 07/20/20 0027  . chlorpheniramine-HYDROcodone (TUSSIONEX) 10-8 MG/5ML suspension 5  mL  5 mL Per Tube Q12H PRN Agbata, Tochukwu, MD      . cholestyramine (QUESTRAN) powder 4 g  4 g Oral TID WC Agbata, Tochukwu, MD      . enoxaparin (LOVENOX) injection 40 mg  40 mg Subcutaneous Q24H Agbata, Tochukwu, MD   40 mg at 07/19/20 2210  . fentaNYL (DURAGESIC) 50 MCG/HR 1 patch  1 patch Transdermal Q72H Agbata, Tochukwu, MD      . furosemide (LASIX) injection 40 mg  40 mg Intravenous Daily Agbata, Tochukwu, MD      . heparin lock flush 100 unit/mL  250 Units Intracatheter PRN Leia Alf, MD      . heparin lock flush 100 unit/mL  500 Units Intracatheter PRN Leia Alf, MD      . hydrOXYzine (ATARAX/VISTARIL) tablet 10 mg  10 mg Per Tube BID PRN Agbata, Tochukwu, MD      . mirtazapine (REMERON) tablet 15 mg  15 mg Oral QHS Agbata, Tochukwu, MD      . morphine CONCENTRATE 10 MG/0.5ML oral solution 5 mg  5 mg Per  Tube Q4H PRN Agbata, Tochukwu, MD      . ondansetron (ZOFRAN) injection 4 mg  4 mg Intravenous Q6H PRN Agbata, Tochukwu, MD   4 mg at 07/20/20 0446  . potassium chloride 20 mEq in sodium chloride 0.9 % 500 mL injection   Intravenous Once Tye Savoy, Doni H, RN      . pravastatin (PRAVACHOL) tablet 10 mg  10 mg Per Tube QHS Agbata, Tochukwu, MD      . protein supplement (ENSURE MAX) liquid   Oral 5 X Daily Agbata, Tochukwu, MD      . sodium chloride 0.9 % injection 10 mL  10 mL Intracatheter PRN Ma Hillock, Sandeep, MD      . sodium chloride 0.9 % injection 10 mL  10 mL Intracatheter PRN Ma Hillock, Sandeep, MD      . sodium chloride 0.9 % injection 10 mL  10 mL Intracatheter PRN Ma Hillock, Sandeep, MD      . sodium chloride 0.9 % injection 10 mL  10 mL Intracatheter PRN Leia Alf, MD      . sodium chloride flush (NS) 0.9 % injection 3 mL  3 mL Intravenous Q12H Agbata, Tochukwu, MD   3 mL at 07/19/20 2211  . sodium chloride flush (NS) 0.9 % injection 3 mL  3 mL Intravenous PRN Agbata, Tochukwu, MD      -year-old  OBJECTIVE: Vitals:   07/19/20 1001  BP: 116/74  Pulse: 88  Resp: 18  Temp: 97.8 F (36.6 C)  SpO2: (!) 84%     Body mass index is 21.61 kg/m.    ECOG FS:2 - Symptomatic, <50% confined to bed  General: Thin, no acute distress.  Sitting in a wheelchair. Eyes: Pink conjunctiva, anicteric sclera. HEENT: Normocephalic, moist mucous membranes. Lungs: No audible wheezing or coughing. Heart: Regular rate and rhythm. Abdomen: Soft, nontender, no obvious distention. Musculoskeletal: No edema, cyanosis, or clubbing. Neuro: Alert. Cranial nerves grossly intact. Skin: No rashes or petechiae noted. Psych: Flat affect.   LAB RESULTS:  Lab Results  Component Value Date   NA 143 07/19/2020   K 4.0 07/19/2020   CL 105 07/19/2020   CO2 25 07/19/2020   GLUCOSE 115 (H) 07/19/2020   BUN 48 (H) 07/19/2020   CREATININE 1.26 (H) 07/19/2020   CALCIUM 8.6 (L) 07/19/2020   PROT 7.3 07/19/2020    ALBUMIN 3.6 07/19/2020   AST 31 07/19/2020  ALT 19 07/19/2020   ALKPHOS 115 07/19/2020   BILITOT 0.5 07/19/2020   GFRNONAA 44 (L) 07/19/2020   GFRAA >60 03/22/2020    Lab Results  Component Value Date   WBC 8.9 07/19/2020   NEUTROABS 8.3 (H) 07/19/2020   HGB 15.3 (H) 07/19/2020   HCT 48.9 (H) 07/19/2020   MCV 95.7 07/19/2020   PLT 371 07/19/2020     STUDIES: CT SOFT TISSUE NECK W CONTRAST  Result Date: 06/26/2020 CLINICAL DATA:  Follow-up head and neck cancer. Squamous cell carcinoma. Surveillance imaging. EXAM: CT NECK WITH CONTRAST TECHNIQUE: Multidetector CT imaging of the neck was performed using the standard protocol following the bolus administration of intravenous contrast. Not utilized. Stenosis is probably in the range of 50% on both sides. Diminutive right jugular vein. CONTRAST:  13m OMNIPAQUE IOHEXOL 300 MG/ML  SOLN COMPARISON:  12/07/2019.  06/07/2019.  05/08/2017. FINDINGS: Pharynx and larynx: No mucosal or submucosal lesion. Salivary glands: Left submandibular gland is diminutive but normal. Right submandibular gland surgically absent. Parotid glands are normal. Thyroid: Normal Lymph nodes: No residual or recurrent lymphadenopathy. Previous right mandibular resection with regional flap reconstruction. Similar appearance of the soft tissues of the region without evidence of recurrent mass. Vascular: Bilateral carotid atherosclerotic disease at the bifurcations. CT angiographic technique was not utilized. Stenosis estimated at approximately 50% on both sides. Diminutive right jugular vein. Limited intracranial: Negative Visualized orbits: Negative Mastoids and visualized paranasal sinuses: Clear Skeleton: Ordinary cervical spondylosis. Right mandibular resection as noted above. No malignant bone findings. Upper chest: See results of chest CT. 1.5 cm density right upper lobe. Other: None IMPRESSION: 1. Previous right mandibular resection with regional flap reconstruction.  Similar appearance of the soft tissues of the region without evidence of recurrent mass. No residual or recurrent lymphadenopathy. 2. Bilateral carotid atherosclerotic disease. Stenosis estimated at approximately 50% on both sides. CT angiographic technique was not utilized. 3. 1.5 cm density right upper lobe.  See results of chest CT. Electronically Signed   By: MNelson ChimesM.D.   On: 06/26/2020 11:20   CT CHEST ABDOMEN PELVIS W CONTRAST  Result Date: 06/26/2020 CLINICAL DATA:  Metastatic head and neck cancer, history of colon cancer EXAM: CT CHEST, ABDOMEN, AND PELVIS WITH CONTRAST TECHNIQUE: Multidetector CT imaging of the chest, abdomen and pelvis was performed following the standard protocol during bolus administration of intravenous contrast. CONTRAST:  1054mOMNIPAQUE IOHEXOL 300 MG/ML SOLN, additional oral enteric contrast COMPARISON:  12/07/2019 FINDINGS: CT CHEST FINDINGS Cardiovascular: Aortic atherosclerosis. Normal heart size. Left and right coronary artery calcifications. Small pericardial effusion, unchanged. Mediastinum/Nodes: No enlarged mediastinal, hilar, or axillary lymph nodes. Thyroid gland, trachea, and esophagus demonstrate no significant findings. Lungs/Pleura: Severe centrilobular emphysema. There are multiple new bilateral pulmonary nodules, in the peripheral right apex measuring 1.5 x 1.4 cm (series 8, image 24), in the anterior right middle lobe measuring 1.8 x 1.2 cm (series 8, image 84) and in the superior segment left lower lobe measuring 2.2 x 1.7 cm (series 8, image 82). No pleural effusion or pneumothorax. Musculoskeletal: No chest wall mass or suspicious bone lesions identified. CT ABDOMEN PELVIS FINDINGS Hepatobiliary: No solid liver abnormality is seen. No gallstones, gallbladder wall thickening, or biliary dilatation. Pancreas: Unremarkable. No pancreatic ductal dilatation or surrounding inflammatory changes. Spleen: Normal in size without significant abnormality.  Adrenals/Urinary Tract: Adrenal glands are unremarkable. Kidneys are normal, without renal calculi, solid lesion, or hydronephrosis. Bladder is unremarkable. Stomach/Bowel: Percutaneous gastrostomy. Stomach is otherwise within normal limits. Appendix is not  clearly visualized. No evidence of bowel wall thickening, distention, or inflammatory changes. Vascular/Lymphatic: No significant vascular findings are present. No enlarged abdominal or pelvic lymph nodes. Reproductive: Status post hysterectomy. Other: No abdominal wall hernia or abnormality. No abdominopelvic ascites. Musculoskeletal: No acute or significant osseous findings. IMPRESSION: 1. There are multiple new bilateral pulmonary nodules, most consistent with new pulmonary metastatic disease although conceivably infectious or inflammatory in nature. Consider tissue sampling. 2. No evidence of metastatic disease within the abdomen or pelvis. 3. Emphysema (ICD10-J43.9). 4. Small, unchanged pericardial effusion. 5. Coronary artery disease.  Aortic Atherosclerosis (ICD10-I70.0). Electronically Signed   By: Eddie Candle M.D.   On: 06/26/2020 15:57   DG Chest Port 1 View  Result Date: 07/19/2020 CLINICAL DATA:  79 year old female with shortness of breath. EXAM: PORTABLE CHEST 1 VIEW COMPARISON:  Chest radiograph dated 11/15/2017. FINDINGS: There is cardiomegaly with vascular congestion and edema. Small bilateral pleural effusions, right greater left, with bibasilar atelectasis. Pneumonia is not excluded clinical correlation is recommended. No pneumothorax. Atherosclerotic calcification of the aorta. No acute osseous pathology. IMPRESSION: Cardiomegaly with findings of CHF and small bilateral pleural effusions. Pneumonia is not excluded. Electronically Signed   By: Anner Crete M.D.   On: 07/19/2020 15:32    ASSESSMENT: Recurrent, progressive stage IVa head and neck squamous cell carcinoma, adenocarcinoma of the colon.  PLAN:    1.  Recurrent,  progressive stage IVa head and neck squamous cell carcinoma: Patient's last infusion of nivolumab was on November 11, 2018.  CT scan results from 06/26/2020 reviewed independently and reported as above with likely metastatic recurrence in patient's lungs.  We had lengthy discussion of next steps including reinitiating treatment, continued observation and repeat CT scan in 3 months, and hospice care.  Patient and husband elected to reinitiate treatment and will proceed with cycle 1 of Opdivo today.  Appreciate palliative care input.  Return to clinic in 2 weeks for further evaluation and consideration of cycle 2. 2.  Diarrhea: Patient does not complain of this today.  Intermittent.  Continue Imodium and Lomotil as needed.   3.  Decreased performance status: Likely multifactorial.  Appreciate palliative care input.   4.  Nausea/vomiting: Patient has intermittent vomiting.  Continue Phenergan as needed. 5.  Pain: Continue current narcotics as prescribed.  Appreciate palliative care input. 6.  PEG tube: Continue tube feeds as per dietary.  Appreciate dietary input. 7.  Cough: Patient was initially prescribed liquid Augmentin, but upon evaluation in the infusion suite she was noted to have oxygen saturations in the 70 to 80%.  This improved to greater than 90 with 3 L of oxygen.  After treatment, patient was referred to the emergency room and appears to have CHF exacerbation and is now admitted to the hospital.   Patient and her husband expressed understanding that they can call or return to clinic at any time if they have any questions, concerns, or complaints.  [name], MD 07/20/20 6:37 AM

## 2020-07-11 NOTE — Telephone Encounter (Signed)
I called and spoke with patient's husband.  He reports several days of an intermittent nonproductive cough.  No fever or chills.  No changes in shortness of breath.  No other constitutional symptoms.  He feels like patient has a "cold" and requests an antitussive.  Note the patient is fully vaccinated for Covid and influenza.  Discussed Covid testing if symptoms worsen.  Also discussed red flags for ER utilization over the holiday weekend.  We discussed Covid booster vaccination when able.  Case and plan discussed with Dr. Grayland Ormond  Plan: -Start Tussionex

## 2020-07-11 NOTE — Telephone Encounter (Signed)
Mr Labree called reporting that Dana Perez has a cold with a "rasping cough" which is nonproductive and not constant. He is concerned because she is to start Immunotherapy soon. Asking for prescription that can be given via her tube or a spray that he can spray the back of her throat with. Please advise

## 2020-07-13 ENCOUNTER — Other Ambulatory Visit: Payer: Self-pay | Admitting: Oncology

## 2020-07-16 ENCOUNTER — Other Ambulatory Visit: Payer: Self-pay | Admitting: *Deleted

## 2020-07-17 MED ORDER — ONDANSETRON HCL 8 MG PO TABS: 8.0000 mg | ORAL_TABLET | Freq: Three times a day (TID) | ORAL | 0 refills | Status: DC | PRN

## 2020-07-18 ENCOUNTER — Other Ambulatory Visit: Payer: Self-pay | Admitting: *Deleted

## 2020-07-18 DIAGNOSIS — C760 Malignant neoplasm of head, face and neck: Secondary | ICD-10-CM

## 2020-07-18 DIAGNOSIS — R051 Acute cough: Secondary | ICD-10-CM | POA: Diagnosis not present

## 2020-07-18 DIAGNOSIS — C148 Malignant neoplasm of overlapping sites of lip, oral cavity and pharynx: Secondary | ICD-10-CM | POA: Diagnosis not present

## 2020-07-18 DIAGNOSIS — R682 Dry mouth, unspecified: Secondary | ICD-10-CM | POA: Diagnosis not present

## 2020-07-18 DIAGNOSIS — H903 Sensorineural hearing loss, bilateral: Secondary | ICD-10-CM | POA: Diagnosis not present

## 2020-07-19 ENCOUNTER — Encounter: Payer: Self-pay | Admitting: Oncology

## 2020-07-19 ENCOUNTER — Other Ambulatory Visit: Payer: Self-pay

## 2020-07-19 ENCOUNTER — Emergency Department: Payer: PPO

## 2020-07-19 ENCOUNTER — Inpatient Hospital Stay: Payer: PPO | Attending: Oncology

## 2020-07-19 ENCOUNTER — Inpatient Hospital Stay: Payer: PPO

## 2020-07-19 ENCOUNTER — Inpatient Hospital Stay
Admission: EM | Admit: 2020-07-19 | Discharge: 2020-07-27 | DRG: 291 | Disposition: A | Discharge status: Hospice | Payer: PPO | Source: Ambulatory Visit | Attending: Internal Medicine | Admitting: Internal Medicine

## 2020-07-19 ENCOUNTER — Inpatient Hospital Stay (HOSPITAL_BASED_OUTPATIENT_CLINIC_OR_DEPARTMENT_OTHER): Payer: PPO | Admitting: Oncology

## 2020-07-19 VITALS — BP 133/71 | HR 92 | Temp 97.1°F | Resp 20

## 2020-07-19 VITALS — BP 116/74 | HR 88 | Temp 97.8°F | Resp 18 | Wt 138.0 lb

## 2020-07-19 DIAGNOSIS — J449 Chronic obstructive pulmonary disease, unspecified: Secondary | ICD-10-CM | POA: Diagnosis present

## 2020-07-19 DIAGNOSIS — C189 Malignant neoplasm of colon, unspecified: Secondary | ICD-10-CM | POA: Diagnosis present

## 2020-07-19 DIAGNOSIS — Z6822 Body mass index (BMI) 22.0-22.9, adult: Secondary | ICD-10-CM

## 2020-07-19 DIAGNOSIS — N390 Urinary tract infection, site not specified: Secondary | ICD-10-CM | POA: Diagnosis present

## 2020-07-19 DIAGNOSIS — J9600 Acute respiratory failure, unspecified whether with hypoxia or hypercapnia: Secondary | ICD-10-CM | POA: Diagnosis not present

## 2020-07-19 DIAGNOSIS — C7802 Secondary malignant neoplasm of left lung: Secondary | ICD-10-CM | POA: Diagnosis not present

## 2020-07-19 DIAGNOSIS — Z87891 Personal history of nicotine dependence: Secondary | ICD-10-CM | POA: Diagnosis not present

## 2020-07-19 DIAGNOSIS — E43 Unspecified severe protein-calorie malnutrition: Secondary | ICD-10-CM | POA: Diagnosis present

## 2020-07-19 DIAGNOSIS — R059 Cough, unspecified: Secondary | ICD-10-CM | POA: Diagnosis not present

## 2020-07-19 DIAGNOSIS — R1312 Dysphagia, oropharyngeal phase: Secondary | ICD-10-CM | POA: Diagnosis present

## 2020-07-19 DIAGNOSIS — Z7401 Bed confinement status: Secondary | ICD-10-CM | POA: Diagnosis not present

## 2020-07-19 DIAGNOSIS — Z20822 Contact with and (suspected) exposure to covid-19: Secondary | ICD-10-CM | POA: Diagnosis present

## 2020-07-19 DIAGNOSIS — Z923 Personal history of irradiation: Secondary | ICD-10-CM

## 2020-07-19 DIAGNOSIS — C7801 Secondary malignant neoplasm of right lung: Secondary | ICD-10-CM | POA: Diagnosis not present

## 2020-07-19 DIAGNOSIS — M810 Age-related osteoporosis without current pathological fracture: Secondary | ICD-10-CM | POA: Diagnosis present

## 2020-07-19 DIAGNOSIS — R0602 Shortness of breath: Secondary | ICD-10-CM

## 2020-07-19 DIAGNOSIS — I10 Essential (primary) hypertension: Secondary | ICD-10-CM | POA: Diagnosis present

## 2020-07-19 DIAGNOSIS — C760 Malignant neoplasm of head, face and neck: Secondary | ICD-10-CM | POA: Diagnosis not present

## 2020-07-19 DIAGNOSIS — G4700 Insomnia, unspecified: Secondary | ICD-10-CM | POA: Diagnosis present

## 2020-07-19 DIAGNOSIS — M2550 Pain in unspecified joint: Secondary | ICD-10-CM | POA: Diagnosis not present

## 2020-07-19 DIAGNOSIS — Z79891 Long term (current) use of opiate analgesic: Secondary | ICD-10-CM | POA: Diagnosis not present

## 2020-07-19 DIAGNOSIS — Z515 Encounter for palliative care: Secondary | ICD-10-CM

## 2020-07-19 DIAGNOSIS — Z66 Do not resuscitate: Secondary | ICD-10-CM | POA: Diagnosis not present

## 2020-07-19 DIAGNOSIS — Z79899 Other long term (current) drug therapy: Secondary | ICD-10-CM | POA: Diagnosis not present

## 2020-07-19 DIAGNOSIS — N1831 Chronic kidney disease, stage 3a: Secondary | ICD-10-CM | POA: Diagnosis present

## 2020-07-19 DIAGNOSIS — I130 Hypertensive heart and chronic kidney disease with heart failure and stage 1 through stage 4 chronic kidney disease, or unspecified chronic kidney disease: Principal | ICD-10-CM | POA: Diagnosis present

## 2020-07-19 DIAGNOSIS — I5033 Acute on chronic diastolic (congestive) heart failure: Secondary | ICD-10-CM | POA: Diagnosis present

## 2020-07-19 DIAGNOSIS — E7800 Pure hypercholesterolemia, unspecified: Secondary | ICD-10-CM | POA: Diagnosis not present

## 2020-07-19 DIAGNOSIS — I248 Other forms of acute ischemic heart disease: Secondary | ICD-10-CM | POA: Diagnosis not present

## 2020-07-19 DIAGNOSIS — G893 Neoplasm related pain (acute) (chronic): Secondary | ICD-10-CM | POA: Diagnosis present

## 2020-07-19 DIAGNOSIS — I5022 Chronic systolic (congestive) heart failure: Secondary | ICD-10-CM | POA: Diagnosis not present

## 2020-07-19 DIAGNOSIS — I313 Pericardial effusion (noninflammatory): Secondary | ICD-10-CM | POA: Diagnosis not present

## 2020-07-19 DIAGNOSIS — J9811 Atelectasis: Secondary | ICD-10-CM | POA: Diagnosis not present

## 2020-07-19 DIAGNOSIS — J9601 Acute respiratory failure with hypoxia: Secondary | ICD-10-CM

## 2020-07-19 DIAGNOSIS — N183 Chronic kidney disease, stage 3 unspecified: Secondary | ICD-10-CM | POA: Diagnosis present

## 2020-07-19 DIAGNOSIS — E785 Hyperlipidemia, unspecified: Secondary | ICD-10-CM | POA: Diagnosis not present

## 2020-07-19 DIAGNOSIS — Z90710 Acquired absence of both cervix and uterus: Secondary | ICD-10-CM

## 2020-07-19 DIAGNOSIS — I509 Heart failure, unspecified: Secondary | ICD-10-CM | POA: Diagnosis not present

## 2020-07-19 DIAGNOSIS — Z9221 Personal history of antineoplastic chemotherapy: Secondary | ICD-10-CM

## 2020-07-19 DIAGNOSIS — Z931 Gastrostomy status: Secondary | ICD-10-CM

## 2020-07-19 DIAGNOSIS — I5031 Acute diastolic (congestive) heart failure: Secondary | ICD-10-CM | POA: Diagnosis not present

## 2020-07-19 DIAGNOSIS — R0902 Hypoxemia: Secondary | ICD-10-CM | POA: Diagnosis not present

## 2020-07-19 DIAGNOSIS — J90 Pleural effusion, not elsewhere classified: Secondary | ICD-10-CM | POA: Diagnosis not present

## 2020-07-19 LAB — URINALYSIS, COMPLETE (UACMP) WITH MICROSCOPIC
Bilirubin Urine: NEGATIVE
Glucose, UA: NEGATIVE mg/dL
Hgb urine dipstick: NEGATIVE
Ketones, ur: NEGATIVE mg/dL
Nitrite: NEGATIVE
Protein, ur: 30 mg/dL — AB
Specific Gravity, Urine: 1.027 (ref 1.005–1.030)
Squamous Epithelial / HPF: NONE SEEN (ref 0–5)
WBC, UA: >50 WBC/hpf — ABNORMAL HIGH (ref 0–5)
pH: 7 (ref 5.0–8.0)

## 2020-07-19 LAB — BASIC METABOLIC PANEL
Anion gap: 13 (ref 5–15)
BUN: 48 mg/dL — ABNORMAL HIGH (ref 8–23)
CO2: 25 mmol/L (ref 22–32)
Calcium: 8.6 mg/dL — ABNORMAL LOW (ref 8.9–10.3)
Chloride: 105 mmol/L (ref 98–111)
Creatinine, Ser: 1.26 mg/dL — ABNORMAL HIGH (ref 0.44–1.00)
GFR, Estimated: 44 mL/min — ABNORMAL LOW (ref >60)
Glucose, Bld: 115 mg/dL — ABNORMAL HIGH (ref 70–99)
Potassium: 4 mmol/L (ref 3.5–5.1)
Sodium: 143 mmol/L (ref 135–145)

## 2020-07-19 LAB — RESP PANEL BY RT-PCR (FLU A&B, COVID) ARPGX2
Influenza A by PCR: NEGATIVE
Influenza B by PCR: NEGATIVE
SARS Coronavirus 2 by RT PCR: NEGATIVE

## 2020-07-19 LAB — CBC WITH DIFFERENTIAL/PLATELET
Abs Immature Granulocytes: 0.03 10*3/uL (ref 0.00–0.07)
Basophils Absolute: 0 10*3/uL (ref 0.0–0.1)
Basophils Relative: 0 %
Eosinophils Absolute: 0.1 10*3/uL (ref 0.0–0.5)
Eosinophils Relative: 1 %
HCT: 50 % — ABNORMAL HIGH (ref 36.0–46.0)
Hemoglobin: 15.6 g/dL — ABNORMAL HIGH (ref 12.0–15.0)
Immature Granulocytes: 0 %
Lymphocytes Relative: 10 %
Lymphs Abs: 1 10*3/uL (ref 0.7–4.0)
MCH: 29.8 pg (ref 26.0–34.0)
MCHC: 31.2 g/dL (ref 30.0–36.0)
MCV: 95.6 fL (ref 80.0–100.0)
Monocytes Absolute: 0.4 10*3/uL (ref 0.1–1.0)
Monocytes Relative: 4 %
Neutro Abs: 8.3 10*3/uL — ABNORMAL HIGH (ref 1.7–7.7)
Neutrophils Relative %: 85 %
Platelets: 385 10*3/uL (ref 150–400)
RBC: 5.23 MIL/uL — ABNORMAL HIGH (ref 3.87–5.11)
RDW: 14.1 % (ref 11.5–15.5)
WBC: 9.8 10*3/uL (ref 4.0–10.5)
nRBC: 0 % (ref 0.0–0.2)

## 2020-07-19 LAB — COMPREHENSIVE METABOLIC PANEL
ALT: 19 U/L (ref 0–44)
AST: 31 U/L (ref 15–41)
Albumin: 3.6 g/dL (ref 3.5–5.0)
Alkaline Phosphatase: 115 U/L (ref 38–126)
Anion gap: 12 (ref 5–15)
BUN: 51 mg/dL — ABNORMAL HIGH (ref 8–23)
CO2: 25 mmol/L (ref 22–32)
Calcium: 8.6 mg/dL — ABNORMAL LOW (ref 8.9–10.3)
Chloride: 105 mmol/L (ref 98–111)
Creatinine, Ser: 1.36 mg/dL — ABNORMAL HIGH (ref 0.44–1.00)
GFR, Estimated: 40 mL/min — ABNORMAL LOW (ref >60)
Glucose, Bld: 137 mg/dL — ABNORMAL HIGH (ref 70–99)
Potassium: 4.4 mmol/L (ref 3.5–5.1)
Sodium: 142 mmol/L (ref 135–145)
Total Bilirubin: 0.5 mg/dL (ref 0.3–1.2)
Total Protein: 7.3 g/dL (ref 6.5–8.1)

## 2020-07-19 LAB — CBC
HCT: 48.9 % — ABNORMAL HIGH (ref 36.0–46.0)
Hemoglobin: 15.3 g/dL — ABNORMAL HIGH (ref 12.0–15.0)
MCH: 29.9 pg (ref 26.0–34.0)
MCHC: 31.3 g/dL (ref 30.0–36.0)
MCV: 95.7 fL (ref 80.0–100.0)
Platelets: 371 10*3/uL (ref 150–400)
RBC: 5.11 MIL/uL (ref 3.87–5.11)
RDW: 13.9 % (ref 11.5–15.5)
WBC: 8.9 10*3/uL (ref 4.0–10.5)
nRBC: 0 % (ref 0.0–0.2)

## 2020-07-19 LAB — TROPONIN I (HIGH SENSITIVITY): Troponin I (High Sensitivity): 20 ng/L — ABNORMAL HIGH (ref <18)

## 2020-07-19 LAB — BRAIN NATRIURETIC PEPTIDE: B Natriuretic Peptide: 521 pg/mL — ABNORMAL HIGH (ref 0.0–100.0)

## 2020-07-19 MED ORDER — SODIUM CHLORIDE 0.9 % IV SOLN
1.0000 g | INTRAVENOUS | Status: AC
  Administered 2020-07-19 – 2020-07-23 (×5): 1 g via INTRAVENOUS
  Filled 2020-07-19 (×2): qty 1
  Filled 2020-07-19: qty 10
  Filled 2020-07-19: qty 1
  Filled 2020-07-19 (×2): qty 10
  Filled 2020-07-19: qty 1

## 2020-07-19 MED ORDER — SODIUM CHLORIDE 0.9% FLUSH: 3.0000 mL | INTRAVENOUS | Status: DC | PRN

## 2020-07-19 MED ORDER — HYDROCOD POLST-CPM POLST ER 10-8 MG/5ML PO SUER: 5.0000 mL | Freq: Two times a day (BID) | ORAL | Status: DC | PRN

## 2020-07-19 MED ORDER — CHOLESTYRAMINE 4 G PO PACK
4.0000 g | PACK | Freq: Three times a day (TID) | ORAL | Status: DC
  Administered 2020-07-20 – 2020-07-21 (×4): 4 g via ORAL
  Filled 2020-07-19 (×7): qty 1

## 2020-07-19 MED ORDER — SODIUM CHLORIDE 0.9 % IV SOLN
250.0000 mL | INTRAVENOUS | Status: DC | PRN
  Administered 2020-07-20 – 2020-07-23 (×4): 250 mL via INTRAVENOUS

## 2020-07-19 MED ORDER — FUROSEMIDE 10 MG/ML IJ SOLN
40.0000 mg | Freq: Once | INTRAMUSCULAR | Status: AC
  Administered 2020-07-19: 40 mg via INTRAVENOUS
  Filled 2020-07-19: qty 4

## 2020-07-19 MED ORDER — ENSURE MAX PROTEIN PO LIQD
Freq: Every day | ORAL | Status: DC
  Filled 2020-07-19: qty 330

## 2020-07-19 MED ORDER — HYDROXYZINE HCL 10 MG PO TABS
10.0000 mg | ORAL_TABLET | Freq: Two times a day (BID) | ORAL | Status: DC | PRN
  Filled 2020-07-19: qty 1

## 2020-07-19 MED ORDER — ENOXAPARIN SODIUM 40 MG/0.4ML ~~LOC~~ SOLN
40.0000 mg | SUBCUTANEOUS | Status: DC
  Administered 2020-07-19 – 2020-07-26 (×8): 40 mg via SUBCUTANEOUS
  Filled 2020-07-19 (×8): qty 0.4

## 2020-07-19 MED ORDER — PRAVASTATIN SODIUM 20 MG PO TABS
10.0000 mg | ORAL_TABLET | Freq: Every day | ORAL | Status: DC
  Administered 2020-07-20 – 2020-07-26 (×7): 10 mg
  Filled 2020-07-19 (×9): qty 1

## 2020-07-19 MED ORDER — FUROSEMIDE 10 MG/ML IJ SOLN
40.0000 mg | Freq: Every day | INTRAMUSCULAR | Status: DC
  Administered 2020-07-20 – 2020-07-24 (×5): 40 mg via INTRAVENOUS
  Filled 2020-07-19 (×5): qty 4

## 2020-07-19 MED ORDER — MIRTAZAPINE 15 MG PO TABS
15.0000 mg | ORAL_TABLET | Freq: Every day | ORAL | Status: DC
  Administered 2020-07-20 – 2020-07-26 (×7): 15 mg via ORAL
  Filled 2020-07-19 (×8): qty 1

## 2020-07-19 MED ORDER — MORPHINE SULFATE (CONCENTRATE) 10 MG/0.5ML PO SOLN
5.0000 mg | ORAL | Status: DC | PRN
  Administered 2020-07-24 – 2020-07-27 (×6): 5 mg
  Filled 2020-07-19 (×8): qty 0.5

## 2020-07-19 MED ORDER — ACETAMINOPHEN 325 MG PO TABS
650.0000 mg | ORAL_TABLET | Freq: Four times a day (QID) | ORAL | Status: DC | PRN
  Administered 2020-07-25: 650 mg via ORAL
  Filled 2020-07-19: qty 2

## 2020-07-19 MED ORDER — FENTANYL 50 MCG/HR TD PT72: 1.0000 | MEDICATED_PATCH | TRANSDERMAL | Status: DC

## 2020-07-19 MED ORDER — ONDANSETRON HCL 4 MG/2ML IJ SOLN
4.0000 mg | Freq: Four times a day (QID) | INTRAMUSCULAR | Status: DC | PRN
  Administered 2020-07-20 – 2020-07-26 (×11): 4 mg via INTRAVENOUS
  Filled 2020-07-19 (×12): qty 2

## 2020-07-19 MED ORDER — SODIUM CHLORIDE 0.9 % IV SOLN
Freq: Once | INTRAVENOUS | Status: AC
  Filled 2020-07-19: qty 250

## 2020-07-19 MED ORDER — SODIUM CHLORIDE 0.9 % IV SOLN
240.0000 mg | Freq: Once | INTRAVENOUS | Status: AC
  Administered 2020-07-19: 240 mg via INTRAVENOUS
  Filled 2020-07-19: qty 24

## 2020-07-19 MED ORDER — NALOXONE HCL 4 MG/0.1ML NA LIQD: 1.0000 | Freq: Once | NASAL | Status: DC

## 2020-07-19 MED ORDER — SODIUM CHLORIDE 0.9% FLUSH
3.0000 mL | Freq: Two times a day (BID) | INTRAVENOUS | Status: DC
  Administered 2020-07-19 – 2020-07-27 (×14): 3 mL via INTRAVENOUS

## 2020-07-19 NOTE — ED Provider Notes (Signed)
[facility] Emergency Department Provider Note ____________________________________________   First MD Initiated Contact with Patient 07/19/20 1509     (approximate)  I have reviewed the triage vital signs and the nursing notes.   HISTORY  Chief Complaint Shortness of Breath    HPI Dana Perez is a 79 y.o. female with PMH as noted below including recurrent stage IV head and neck cancer who presents with shortness of breath, relatively acute onset yesterday and persistent today, and associated with a nonproductive cough.  The patient went for an immune therapy treatment today and was found to be hypoxic to the 70s on room air.  She is not normally on oxygen.  She was sent to the ED for further evaluation.  Past Medical History:  Diagnosis Date  . Acute hypoxemic respiratory failure ([place]) 11/15/2017  . Acute respiratory failure ([place]) 11/15/2017  . Adenocarcinoma of colon ([place])   . Benign neoplasm of ascending colon   . CAP (community acquired pneumonia) 11/15/2017  . Essential hypertension 06/13/2014  . Goals of care, counseling/discussion 07/14/2017  . Healthcare-associated pneumonia   . HLD (hyperlipidemia) 06/13/2014  . Hypercholesteremia   . Hyperlipidemia   . Melena   . Multiple thyroid nodules 01/22/2012  . Osteoporosis   . Polycythemia, secondary 12/26/2014  . Pure hypercholesterolemia 03/15/2015  . Squamous cell carcinoma of head and neck ([place])   . Thyroid nodule     Patient Active Problem List   Diagnosis Date Noted  . Acute respiratory failure ([place]) 07/19/2020  . Acute CHF (congestive heart failure) ([place]) 07/19/2020  . COPD (chronic obstructive pulmonary disease) ([place]) 07/19/2020  . Acute lower UTI 07/19/2020  . Chronic obstructive pulmonary disease ([place]) 04/30/2020  . Gastrostomy tube dependent ([place]) 04/30/2020  . Chronic face pain (Right) 04/22/2020  . Right-sided face pain 04/22/2020  . Atypical face pain 04/22/2020  . Maxillary  pain (Right) 04/22/2020  . Mandibular pain (Right) 04/22/2020  . Mouth pain 04/22/2020  . Chronic pain syndrome 03/29/2020  . Pharmacologic therapy 03/29/2020  . Disorder of skeletal system 03/29/2020  . Problems influencing health status 03/29/2020  . Protein-calorie malnutrition, severe 05/18/2019  . Skin lesion 05/18/2018  . Cancer associated pain 12/31/2017  . Chronic pain due to neoplasm 12/31/2017  . Oral candidiasis 11/15/2017  . Trimalleolar fracture of ankle, closed, left, with routine healing, subsequent encounter 09/27/2017  . Malnutrition of moderate degree 08/10/2017  . Closed left ankle fracture 08/07/2017  . Goals of care, counseling/discussion 07/14/2017  . Adenocarcinoma of colon ([place])   . Squamous cell carcinoma of head and neck ([place]) 07/01/2017  . CKD (chronic kidney disease) stage 3, GFR 30-59 ml/min 04/26/2017  . Erythrocytosis 07/17/2015  . Pure hypercholesterolemia 03/15/2015  . Polycythemia, secondary 12/26/2014  . Thyroid nodule 12/14/2014  . HLD (hyperlipidemia) 06/13/2014  . Hyperlipidemia, unspecified 06/13/2014  . Essential hypertension 06/13/2014  . Multiple thyroid nodules 01/22/2012    Past Surgical History:  Procedure Laterality Date  . ABDOMINAL HYSTERECTOMY    . COLONOSCOPY WITH PROPOFOL N/A 07/02/2017   Procedure: COLONOSCOPY WITH PROPOFOL;  Surgeon: Lucilla Lame, MD;  Location: [place];  Service: Endoscopy;  Laterality: N/A;  . ESOPHAGOGASTRODUODENOSCOPY N/A 05/17/2019   Procedure: ESOPHAGOGASTRODUODENOSCOPY (EGD);  Surgeon: Lin Landsman, MD;  [facility] ENDOSCOPY;  Service: Gastroenterology;  Laterality: N/A;  . ORIF ANKLE FRACTURE Left 08/08/2017   Procedure: OPEN REDUCTION INTERNAL FIXATION (ORIF) ANKLE FRACTURE;  Surgeon: Dereck Leep, MD;  Location: [facility] ORS;  Service: Orthopedics;  Laterality: Left;  . POLYPECTOMY N/A 07/02/2017   Procedure: POLYPECTOMY;  Surgeon: Lucilla Lame, MD;  Location: [place];  Service: Endoscopy;  Laterality: N/A;  . WRIST FRACTURE SURGERY  08/2009   badly broken    Prior to Admission medications   Medication Sig Start Date End Date Taking? Authorizing Provider  acetaminophen (TYLENOL) 500 MG tablet Take 1,000 mg by mouth 2 (two) times daily as needed. 1000, 1600   Yes [provider]  bacitracin ointment Apply 1 application topically 2 (two) times daily as needed.  11/09/19  Yes [provider]  chlorpheniramine-HYDROcodone (TUSSIONEX) 10-8 MG/5ML SUER Place 5 mLs into feeding tube every 12 (twelve) hours as needed for cough. Patient taking differently: Place 5 mLs into feeding tube every 12 (twelve) hours as needed for cough. 0700, 1900 07/11/20  Yes Borders, Kirt Boys, NP  clotrimazole-betamethasone (LOTRISONE) cream Apply to when changing dressing 10/05/18  Yes [provider]  Ensure Plus (ENSURE PLUS) LIQD Give 1 carton of ensure plus 5 times daily via feeding tube.  Flush with 80m of water before and after feeding. 10/28/18  Yes FLloyd Huger MD  fentaNYL (DURAGESIC) 50 MCG/HR Place 1 patch onto the skin every 3 (three) days. 06/28/20  Yes Borders, JKirt Boys NP  hydrOXYzine (ATARAX/VISTARIL) 10 MG tablet Place 10 mg into feeding tube 2 (two) times daily as needed.  08/13/19  Yes [provider]  mirtazapine (REMERON) 15 MG tablet Take 1 tablet (15 mg total) by mouth at bedtime. Crush and administer via feeding tube. 01/02/20  Yes Borders, JKirt Boys NP  Morphine Sulfate (MORPHINE CONCENTRATE) 10 mg / 0.5 ml concentrated solution Place 0.25 mLs (5 mg total) into feeding tube every 4 (four) hours as needed for moderate pain or severe pain. Patient taking differently: Place 5 mg into feeding tube every 4 (four) hours as needed for moderate pain or severe pain. 0700, 1300, 1900 06/28/20  Yes Borders, JKirt Boys NP  ondansetron (ZOFRAN) 8 MG tablet Place 1 tablet (8 mg total) into feeding tube every 8 (eight) hours as needed  for nausea or vomiting. 07/17/20  Yes Borders, JKirt Boys NP  pravastatin (PRAVACHOL) 10 MG tablet Place 10 mg into feeding tube daily.    Yes [provider]  prochlorperazine (COMPAZINE) 10 MG tablet PLACE 1 TABLET (10 MG TOTAL) INTO FEEDING TUBE EVERY 6 (SIX) HOURS AS NEEDED FOR NAUSEA OR VOMITING. 07/14/20  Yes Finnegan, TKathlene November MD  cholestyramine (Lucrezia Starch 4 GM/DOSE powder Take 1 packet (4 g total) by mouth 3 (three) times daily with meals. Patient not taking: Reported on 07/19/2020 08/30/18   BJacquelin Hawking NP  [facility] MG/0.1ML LIQD nasal spray kit USE 1 SPRAY PER INTRANASAL ROUTE AS DIRECTED 05/03/18   [provider]    Allergies Buprenorphine, Other, and Tetanus antitoxin  Family History  Problem Relation Age of Onset  . Heart disease Mother   . Diabetes Father     Social History Social History   Tobacco Use  . Smoking status: Former Smoker    Packs/day: 0.25    Types: Cigarettes    Quit date: 06/17/2017    Years since quitting: 3.0  . Smokeless tobacco: Never Used  Vaping Use  . Vaping Use: Never used  Substance Use Topics  . Alcohol use: No  . Drug use: No    Review of Systems  Constitutional: No fever/chills Eyes: No visual changes. ENT: No sore throat. Cardiovascular: Denies chest pain. Respiratory: Positive  for shortness of breath. Gastrointestinal: No vomiting or diarrhea.  Genitourinary: Negative for dysuria.  Musculoskeletal: Negative for back pain. Skin: Negative for rash. Neurological: Negative for headache.   ____________________________________________   PHYSICAL EXAM:  VITAL SIGNS: ED Triage Vitals  Enc Vitals Group     BP 07/19/20 1441 117/81     Pulse Rate 07/19/20 1441 96     Resp 07/19/20 1441 (!) 24     Temp 07/19/20 1441 97.6 F (36.4 C)     Temp Source 07/19/20 1441 Oral     SpO2 07/19/20 1441 93 %     Weight 07/19/20 1445 138 lb (62.6 kg)     Height 07/19/20 1445 5' 5"  (1.651 m)     Head  Circumference --      Peak Flow --      Pain Score 07/19/20 1445 3     Pain Loc --      Pain Edu? --      Excl. in [place]? --     Constitutional: Alert and oriented.  Weak appearing but in no acute distress. Eyes: Conjunctivae are normal.  Head: Atraumatic. Nose: No congestion/rhinnorhea. Mouth/Throat: Mucous membranes are moist.   Neck: Normal range of motion.  Cardiovascular: Normal rate, regular rhythm. Grossly normal heart sounds.  Good peripheral circulation. Respiratory: Slightly increased respiratory effort.  No retractions.  Diminished breath sounds bilaterally with no significant rales or wheezes. Gastrointestinal: Soft and nontender. No distention.  Genitourinary: No flank tenderness. Musculoskeletal: No lower extremity edema.  Extremities warm and well perfused.  Neurologic: Motor intact in all extremities. Skin:  Skin is warm and dry. No rash noted. Psychiatric: Mood and affect are normal. Speech and behavior are normal.  ____________________________________________   LABS (all labs ordered are listed, but only abnormal results are displayed)  Labs Reviewed  BASIC METABOLIC PANEL - Abnormal; Notable for the following components:      Result Value   Glucose, Bld 115 (*)    BUN 48 (*)    Creatinine, Ser 1.26 (*)    Calcium 8.6 (*)    GFR, Estimated 44 (*)    All other components within normal limits  CBC - Abnormal; Notable for the following components:   Hemoglobin 15.3 (*)    HCT 48.9 (*)    All other components within normal limits  URINALYSIS, COMPLETE (UACMP) WITH MICROSCOPIC - Abnormal; Notable for the following components:   Color, Urine YELLOW (*)    APPearance CLOUDY (*)    Protein, ur 30 (*)    Leukocytes,Ua LARGE (*)    WBC, UA >50 (*)    Bacteria, UA MANY (*)    All other components within normal limits  BRAIN NATRIURETIC PEPTIDE - Abnormal; Notable for the following components:   B Natriuretic Peptide 521.0 (*)    All other components within  normal limits  TROPONIN I (HIGH SENSITIVITY) - Abnormal; Notable for the following components:   Troponin I (High Sensitivity) 20 (*)    All other components within normal limits  RESP PANEL BY RT-PCR (FLU A&B, COVID) [id number]  BASIC METABOLIC PANEL  TROPONIN I (HIGH SENSITIVITY)   ____________________________________________  EKG  ED ECG REPORT I, Arta Silence, the attending physician, personally viewed and interpreted this ECG.  Date: 07/19/2020 EKG Time: 1452 Rate: 95 Rhythm: normal sinus rhythm QRS Axis: Right axis Intervals: normal ST/T Wave abnormalities: Nonspecific T wave abnormalities Narrative Interpretation: no evidence of acute ischemia  ____________________________________________  RADIOLOGY  CXR interpreted by me shows bilateral  effusions and vascular congestion  ____________________________________________   PROCEDURES  Procedure(s) performed: No  Procedures  Critical Care performed: No ____________________________________________   INITIAL IMPRESSION / ASSESSMENT AND PLAN / ED COURSE  Pertinent labs & imaging results that were available during my care of the patient were reviewed by me and considered in my medical decision making (see chart for details).  79 year old female with PMH as noted above including history of stage IV head and neck cancer for which she is currently being treated presents with relatively acute onset of shortness of breath and cough over the last 1 to 2 days.  She was noted to be significantly hypoxic when at the infusion clinic today.  I reviewed the past medical records in Epic and confirmed the history of stage IV head and neck cancer.  The patient had a CT of the chest and abdomen on 11/9 which showed multiple new bilateral pulmonary nodules consistent with metastatic disease.  On exam currently, the patient is weak and tired appearing.  Her vital signs are normal except for O2 saturation which is in the low 90s on 6  L by nasal cannula.  She is not demonstrating acute respiratory distress.  There is good air movement to the lungs with no significant wheezing or noted rales.  There is no peripheral edema.  Differential includes bacterial pneumonia, COVID-19, flu or other viral etiology, pleural effusions and/or progression of the cancer to the lungs, or possible CHF.  We will obtain a chest x-ray, lab work-up, and reassess.  I anticipate admission.  ----------------------------------------- 6:27 PM on 07/19/2020 -----------------------------------------  X-ray and lab work-up are suggestive of pleural effusions and possible new onset pulmonary edema rather than an infectious process. I ordered IV Lasix for diuresis. Given the new oxygen requirement the patient will need admission. I discussed her case with Dr. Francine Graven from the hospitalist service.  ____________________________  Dana Perez was evaluated in Emergency Department on 07/19/2020 for the symptoms described in the history of present illness. She was evaluated in the context of the global COVID-19 pandemic, which necessitated consideration that the patient might be at risk for infection with the SARS-CoV-2 virus that causes COVID-19. Institutional protocols and algorithms that pertain to the evaluation of patients at risk for COVID-19 are in a state of rapid change based on information released by regulatory bodies including the CDC and federal and state organizations. These policies and algorithms were followed during the patient's care in the ED. ____________________________________________   FINAL CLINICAL IMPRESSION(S) / ED DIAGNOSES  Final diagnoses:  Acute respiratory failure with hypoxia (HCC)      NEW MEDICATIONS STARTED DURING THIS VISIT:  New Prescriptions   No medications on file     Note:  This document was prepared using Dragon voice recognition software and may include unintentional dictation errors.   Arta Silence, MD 07/19/20 [phone]

## 2020-07-19 NOTE — ED Triage Notes (Signed)
Pt to ED from cancer treatment for complaint of hypoxia and shob. Pt with husband. Receiving chemo for skin cancer.  Pt typically on RA, 92% on 6L [place].  RR labored, pt appears lethargic but alert.

## 2020-07-19 NOTE — ED Notes (Signed)
First RN note:  Pt's physician sent her over here for hypoxia with a room air saturation in the 70's.  They cannot facilitate home oxygen therapy at this time and need her evaluated.  Pt able to maintain O2 saturation at 3L per physician.

## 2020-07-19 NOTE — Progress Notes (Signed)
Patient husband says she has had cold/cough.

## 2020-07-19 NOTE — H&P (Signed)
History and Physical    Dana Perez:[id number] DOB: [birth year] DOA: 07/19/2020  PCP: Dana Elk, MD   Patient coming from: Home  I have personally briefly reviewed patient's old medical records in [place]  Chief Complaint: Shortness of breath  HPI: Dana Perez is a 79 y.o. 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  She had gone to the cancer center for her 70-monthroutine evaluation and to discuss imaging results.  Patient was noted to be hypoxic during the visit with normal pulse oximetry in the 70s and was placed on oxygen via nasal cannula.  She complains of shortness of breath and has a nonproductive cough but denies having any fever or chills.  She denies having any chest pain, no nausea, no vomiting, no changes in her bowel habits, no dizziness or lightheadedness. Labs show sodium 143, potassium 4.0, chloride 105, bicarb 25, glucose 115, BUN 48, creatinine 1.26, calcium 8.6, alkaline phosphatase 115, albumin 3.6, AST 31, ALT 19, total protein 7.3, BNP 521, troponin XX, white count 8.9, hemoglobin 15.3, hematocrit 48.9, RDW 13.9, platelet count 371 Chest x-ray reviewed by me shows cardiomegaly with findings of CHF and small bilateral pleural effusions. CT scan of chest and abdomen 07/06/20 showed multiple new bilateral pulmonary nodules, most consistent with new pulmonary metastatic disease.  No evidence of metastatic disease within the abdomen or pelvis.  Emphysema Twelve-lead EKG reviewed by me shows sinus rhythm with nonspecific T wave abnormality    ED Course: Patient is a 79year old Caucasian female with a history of stage IV head and neck cancer as well as adenocarcinoma of the colon who was sent to  the emergency room from the cancer center for evaluation of shortness of breath associated with hypoxia.  Patient had room air pulse oximetry in the 70s that improved following oxygen supplementation at 6 L to 92%.  Imaging is suggestive of small bilateral pleural effusions and cardiomegaly.  Patient will be admitted to the hospital for further evaluation.  Review of Systems: As per HPI otherwise 10 point review of systems negative.    Past Medical History:  Diagnosis Date  . Acute hypoxemic respiratory failure ([facility] 11/15/2017  . Acute respiratory failure ([facility] 11/15/2017  . Adenocarcinoma of colon ([facility]   . Benign neoplasm of ascending colon   . CAP (community acquired pneumonia) 11/15/2017  . Essential hypertension 06/13/2014  . Goals of care, counseling/discussion 07/14/2017  . Healthcare-associated pneumonia   . HLD (hyperlipidemia) 06/13/2014  . Hypercholesteremia   . Hyperlipidemia   . Melena   . Multiple thyroid nodules 01/22/2012  . Osteoporosis   . Polycythemia, secondary 12/26/2014  . Pure hypercholesterolemia 03/15/2015  . Squamous [place] carcinoma of [place] and neck ([facility]   . Thyroid nodule     Past Surgical History:  Procedure Laterality Date  . ABDOMINAL HYSTERECTOMY    . COLONOSCOPY WITH PROPOFOL N/A 07/02/2017   Procedure: COLONOSCOPY WITH PROPOFOL;  Surgeon: WLucilla Lame MD;  [facility]  Service: Endoscopy;  Laterality: N/A;  . ESOPHAGOGASTRODUODENOSCOPY N/A 05/17/2019   Procedure: ESOPHAGOGASTRODUODENOSCOPY (EGD);  Surgeon: VLin Landsman MD;  [facility];  Service: Gastroenterology;  Laterality: N/A;  . ORIF ANKLE FRACTURE Left 08/08/2017   Procedure: OPEN REDUCTION INTERNAL FIXATION (ORIF) ANKLE FRACTURE;  Surgeon: Dereck Leep, MD;  Location: [facility];  Service: Orthopedics;  Laterality: Left;  . POLYPECTOMY N/A 07/02/2017   Procedure: POLYPECTOMY;  Surgeon: Lucilla Lame, MD;  Location: [facility];  Service: Endoscopy;   Laterality: N/A;  . WRIST FRACTURE SURGERY  08/2009   badly broken     reports that she quit smoking about 3 years ago. Her smoking use included cigarettes. She smoked 0.25 packs per day. She has never used smokeless tobacco. She reports that she does not drink alcohol and does not use drugs.  Allergies  Allergen Reactions  . Buprenorphine Nausea Only and Nausea And Vomiting  . Other Other (See Comments) and Rash    TDAP tdap TDAP  . Tetanus Antitoxin Rash    Family History  Problem Relation Age of Onset  . Heart disease Mother   . Diabetes Father      Prior to Admission medications   Medication Sig Start Date End Date Taking? Authorizing Provider  acetaminophen (TYLENOL) 500 MG tablet Take by mouth.    [provider]  bacitracin ointment APPLY TO AFFECTED AREA TWICE A DAY 11/09/19   [provider]  chlorpheniramine-HYDROcodone (TUSSIONEX) 10-8 MG/5ML SUER Place 5 mLs into feeding tube every 12 (twelve) hours as needed for cough. 07/11/20   Borders, Kirt Boys, NP  cholestyramine Lucrezia Starch) 4 GM/DOSE powder Take 1 packet (4 g total) by mouth 3 (three) times daily with meals. Patient taking differently: Place 4 g into feeding tube 3 (three) times daily with meals.  08/30/18   Jacquelin Hawking, NP  clotrimazole-betamethasone (LOTRISONE) cream APPLY TO AFFECTED AREA TWICE A DAY 10/05/18   [provider]  Ensure Plus (ENSURE PLUS) LIQD Give 1 carton of ensure plus 5 times daily via feeding tube.  Flush with 41m of water before and after feeding. 10/28/18   FLloyd Huger MD  fentaNYL (DURAGESIC) 50 MCG/HR Place 1 patch onto the skin every 3 (three) days. 06/28/20   Borders, JKirt Boys NP  hydrOXYzine (ATARAX/VISTARIL) 10 MG tablet Place 10 mg into feeding tube 2 (two) times daily as needed.  08/13/19   [provider]  mirtazapine (REMERON) 15 MG tablet Take 1 tablet (15 mg total) by mouth at bedtime. Crush and administer via feeding tube.  01/02/20   Borders, JKirt Boys NP  Morphine Sulfate (MORPHINE CONCENTRATE) 10 mg / 0.5 ml concentrated solution Place 0.25 mLs (5 mg total) into feeding tube every 4 (four) hours as needed for moderate pain or severe pain. 06/28/20   Borders, JKirt Boys NP  NARCAN 4 MG/0.1ML LIQD nasal spray kit USE 1 SPRAY PER INTRANASAL ROUTE AS DIRECTED 05/03/18   [provider]  ondansetron (ZOFRAN) 8 MG tablet Place 1 tablet (8 mg total) into feeding tube every 8 (eight) hours as needed for nausea or vomiting. 07/17/20   Borders, JKirt Boys NP  pravastatin (PRAVACHOL) 10 MG tablet Place 10 mg into feeding tube daily.     [provider]  prochlorperazine (COMPAZINE) 10 MG tablet PLACE 1 TABLET (10 MG TOTAL) INTO FEEDING TUBE EVERY 6 (SIX) HOURS AS NEEDED FOR NAUSEA OR VOMITING. 07/14/20   FLloyd Huger MD    Physical Exam: Vitals:   07/19/20 1441 07/19/20 1445 07/19/20 1708 07/19/20 1730  BP: 117/81  (!) 156/79 (!) 159/84  Pulse: 96  93 95  Resp: (!) 24  (!) 22 13  Temp: 97.6 F (36.4 C)     TempSrc: Oral     SpO2:  93%  94% 93%  Weight:  62.6 kg    Height:  5' 5"  (1.651 m)       Vitals:   07/19/20 1441 07/19/20 1445 07/19/20 1708 07/19/20 1730  BP: 117/81  (!) 156/79 (!) 159/84  Pulse: 96  93 95  Resp: (!) 24  (!) 22 13  Temp: 97.6 F (36.4 C)     TempSrc: Oral     SpO2: 93%  94% 93%  Weight:  62.6 kg    Height:  5' 5"  (1.651 m)      Constitutional: NAD, alert and oriented x 3.  Chronically ill-appearing.  Has a wet sounding cough Eyes: PERRL, lids and conjunctivae pallor ENMT: Mucous membranes are moist.  Neck: normal, supple, no masses, no thyromegaly Respiratory: Crackles at the bases bilaterally, no wheezing.  Tachypneic Cardiovascular: Regular rate and rhythm, no murmurs / rubs / gallops. No extremity edema. 2+ pedal pulses. No carotid bruits.  Abdomen: no tenderness, no masses palpated. No hepatosplenomegaly. Bowel sounds positive.  PEG tube in  place Musculoskeletal: no clubbing / cyanosis. No joint deformity upper and lower extremities.  Skin: no rashes, lesions, ulcers.  Neurologic: No gross focal neurologic deficit.  Generalized weakness Psychiatric: Normal mood and affect.   Labs on Admission: I have personally reviewed following labs and imaging studies  CBC: Recent Labs  Lab 07/19/20 0944 07/19/20 1454  WBC 9.8 8.9  NEUTROABS 8.3*  --   HGB 15.6* 15.3*  HCT 50.0* 48.9*  MCV 95.6 95.7  PLT 385 952   Basic Metabolic Panel: Recent Labs  Lab 07/19/20 0944 07/19/20 1454  NA 142 143  K 4.4 4.0  CL 105 105  CO2 25 25  GLUCOSE 137* 115*  BUN 51* 48*  CREATININE 1.36* 1.26*  CALCIUM 8.6* 8.6*   GFR: Estimated Creatinine Clearance: 33.6 mL/min (A) (by C-G formula based on SCr of 1.26 mg/dL (H)). Liver Function Tests: Recent Labs  Lab 07/19/20 0944  AST 31  ALT 19  ALKPHOS 115  BILITOT 0.5  PROT 7.3  ALBUMIN 3.6   No results for input(s): LIPASE, AMYLASE in the last 168 hours. No results for input(s): AMMONIA in the last 168 hours. Coagulation Profile: No results for input(s): INR, PROTIME in the last 168 hours. Cardiac Enzymes: No results for input(s): CKTOTAL, CKMB, CKMBINDEX, TROPONINI in the last 168 hours. BNP (last 3 results) No results for input(s): PROBNP in the last 8760 hours. HbA1C: No results for input(s): HGBA1C in the last 72 hours. CBG: No results for input(s): GLUCAP in the last 168 hours. Lipid Profile: No results for input(s): CHOL, HDL, LDLCALC, TRIG, CHOLHDL, LDLDIRECT in the last 72 hours. Thyroid Function Tests: No results for input(s): TSH, T4TOTAL, FREET4, T3FREE, THYROIDAB in the last 72 hours. Anemia Panel: No results for input(s): VITAMINB12, FOLATE, FERRITIN, TIBC, IRON, RETICCTPCT in the last 72 hours. Urine analysis:    Component Value Date/Time   COLORURINE YELLOW (A) 07/19/2020 1448   APPEARANCEUR CLOUDY (A) 07/19/2020 1448   LABSPEC 1.027 07/19/2020 1448    PHURINE 7.0 07/19/2020 1448   GLUCOSEU NEGATIVE 07/19/2020 1448   HGBUR NEGATIVE 07/19/2020 1448   BILIRUBINUR NEGATIVE 07/19/2020 1448   KETONESUR NEGATIVE 07/19/2020 1448   PROTEINUR 30 (A) 07/19/2020 1448   NITRITE NEGATIVE 07/19/2020 1448   LEUKOCYTESUR LARGE (A) 07/19/2020 1448    Radiological Exams on Admission: DG Chest Port 1 View  Result Date: 07/19/2020 CLINICAL DATA:  79 year old female with shortness of breath. EXAM: PORTABLE CHEST 1  VIEW COMPARISON:  Chest radiograph dated 11/15/2017. FINDINGS: There is cardiomegaly with vascular congestion and edema. Small bilateral pleural effusions, right greater left, with bibasilar atelectasis. Pneumonia is not excluded clinical correlation is recommended. No pneumothorax. Atherosclerotic calcification of the aorta. No acute osseous pathology. IMPRESSION: Cardiomegaly with findings of CHF and small bilateral pleural effusions. Pneumonia is not excluded. Electronically Signed   By: Anner Crete M.D.   On: 07/19/2020 15:32    EKG: Independently reviewed.  Sinus rhythm Nonspecific T wave abnormality  Assessment/Plan Principal Problem:   Acute respiratory failure (HCC) Active Problems:   Essential hypertension   Squamous cell carcinoma of head and neck (HCC)   CKD (chronic kidney disease) stage 3, GFR 30-59 ml/min   Acute CHF (congestive heart failure) (HCC)   COPD (chronic obstructive pulmonary disease) (HCC)   Acute lower UTI    Acute respiratory failure Appears to be multifactorial and related to acute CHF as well as metastatic pulmonary disease Patient also has underlying COPD She has increased work of breathing, is tachypneic and had room air pulse oximetry in the 70s which has improved following oxygen supplementation at 6 L to 92% Patient will need to be assessed for home oxygen need prior to discharge    Acute CHF (New onset) Unclear etiology Patient presents for evaluation of shortness of breath, BNP is elevated  and chest x-ray shows cardiomegaly with bilateral pleural effusion Obtain 2D echocardiogram to assess LVEF Place patient on Lasix 40 mg IV daily Patient not on an ACE inhibitor due to renal insufficiency and not on a beta-blocker due to acute decompensation We will obtain cardiology consult    Hypertension with stage III chronic kidney disease Renal function appears stable We will monitor closely during this hospitalization    UTI We will treat patient empirically with Rocephin 1 g daily until urine culture results become available    Stage IV head and neck cancer Continue nutrition through PEG tube Follow-up with oncology as an outpatient    Cancer related pain Continue fentanyl and as needed morphine    DVT prophylaxis: Lovenox Code Status: Full code Family Communication: Called and left a voicemail for patient's husband Mr.Edker Zamudio, awaiting callback to discuss patient's plan of care. Disposition Plan: Back to previous home environment Consults called: Cardiology/oncology Admission status: Inpatient    Kendrick Remigio MD [facility]     07/19/2020, 6:49 PM

## 2020-07-19 NOTE — Progress Notes (Signed)
Patient O2 sats 84% prior to coming to infusion. Upon patient arrival to infusion via wheelchair, patient's VS were taken and patient was approx. 76-79% with HR 101. Patient placed on 2L O2 via [place]. Sats increased to approx. 81%. Increased to 3L, Sats 92%. Patient has congested cough and states this has been present "for a few days". Denies any other s/s. Dr. Grayland Ormond was immediately notified with initial [place], NP assessed patient at chairside.   Per Dr. Grayland Ormond, titrate patient on O2 until sats are >90% and proceed with treatment. Patient to get CXR after treatment.   Patient completed treatment. Attempted to titrate patient off of O2 to see how she would tolerate since she does not have oxygen available at home. Patient started at 93% and decreased to 78%. Placed patient back on 3L and she has increased back to 93%. Dr. Grayland Ormond and team made aware.   Per Tillie Rung, RN, per Dr. Grayland Ormond, patient to go to ED. Report given to triage nurse by Pete Glatter., NP.  Patient transported via wheelchair with 3L O2 [place]. IV still intact.

## 2020-07-19 NOTE — ED Notes (Signed)
Pharmacy contacted requesting ordered Abx as both ED pyxis state they are out. Awaiting tube of Ensure from dietary.

## 2020-07-20 ENCOUNTER — Inpatient Hospital Stay
Admit: 2020-07-20 | Discharge: 2020-07-20 | Disposition: A | Discharge status: Home / Self Care | Payer: PPO | Attending: Internal Medicine | Admitting: Internal Medicine

## 2020-07-20 ENCOUNTER — Encounter: Payer: Self-pay | Admitting: Internal Medicine

## 2020-07-20 DIAGNOSIS — J9601 Acute respiratory failure with hypoxia: Secondary | ICD-10-CM | POA: Diagnosis not present

## 2020-07-20 DIAGNOSIS — N390 Urinary tract infection, site not specified: Secondary | ICD-10-CM

## 2020-07-20 DIAGNOSIS — I5031 Acute diastolic (congestive) heart failure: Secondary | ICD-10-CM

## 2020-07-20 DIAGNOSIS — C760 Malignant neoplasm of head, face and neck: Secondary | ICD-10-CM | POA: Diagnosis not present

## 2020-07-20 DIAGNOSIS — I10 Essential (primary) hypertension: Secondary | ICD-10-CM

## 2020-07-20 DIAGNOSIS — N1831 Chronic kidney disease, stage 3a: Secondary | ICD-10-CM

## 2020-07-20 LAB — TROPONIN I (HIGH SENSITIVITY): Troponin I (High Sensitivity): 24 ng/L — ABNORMAL HIGH (ref <18)

## 2020-07-20 LAB — THYROID PANEL WITH TSH
Free Thyroxine Index: 2.5 (ref 1.2–4.9)
T3 Uptake Ratio: 25 % (ref 24–39)
T4, Total: 9.8 ug/dL (ref 4.5–12.0)
TSH: 1 u[IU]/mL (ref 0.450–4.500)

## 2020-07-20 LAB — BASIC METABOLIC PANEL
Anion gap: 13 (ref 5–15)
BUN: 42 mg/dL — ABNORMAL HIGH (ref 8–23)
CO2: 28 mmol/L (ref 22–32)
Calcium: 8.2 mg/dL — ABNORMAL LOW (ref 8.9–10.3)
Chloride: 105 mmol/L (ref 98–111)
Creatinine, Ser: 1.24 mg/dL — ABNORMAL HIGH (ref 0.44–1.00)
GFR, Estimated: 45 mL/min — ABNORMAL LOW (ref >60)
Glucose, Bld: 94 mg/dL (ref 70–99)
Potassium: 3.6 mmol/L (ref 3.5–5.1)
Sodium: 146 mmol/L — ABNORMAL HIGH (ref 135–145)

## 2020-07-20 LAB — ECHOCARDIOGRAM COMPLETE
Height: 65 in
S' Lateral: 2.48 cm
Weight: 2208 oz

## 2020-07-20 LAB — GLUCOSE, CAPILLARY: Glucose-Capillary: 105 mg/dL — ABNORMAL HIGH (ref 70–99)

## 2020-07-20 LAB — MAGNESIUM: Magnesium: 2.7 mg/dL — ABNORMAL HIGH (ref 1.7–2.4)

## 2020-07-20 MED ORDER — CLOTRIMAZOLE 1 % EX CREA
TOPICAL_CREAM | CUTANEOUS | Status: DC
  Filled 2020-07-20 (×2): qty 15

## 2020-07-20 MED ORDER — ENSURE ENLIVE PO LIQD
237.0000 mL | Freq: Every day | ORAL | Status: DC
  Administered 2020-07-22 – 2020-07-27 (×26): 237 mL

## 2020-07-20 MED ORDER — PROSOURCE TF PO LIQD
45.0000 mL | Freq: Two times a day (BID) | ORAL | Status: DC
  Administered 2020-07-21 – 2020-07-27 (×11): 45 mL
  Filled 2020-07-20 (×15): qty 45

## 2020-07-20 MED ORDER — FENTANYL 50 MCG/HR TD PT72
1.0000 | MEDICATED_PATCH | TRANSDERMAL | Status: DC
  Administered 2020-07-21 – 2020-07-27 (×3): 1 via TRANSDERMAL
  Filled 2020-07-20 (×3): qty 1

## 2020-07-20 MED ORDER — FREE WATER
100.0000 mL | Freq: Every day | Status: DC
  Administered 2020-07-20 – 2020-07-27 (×33): 100 mL

## 2020-07-20 MED ORDER — BACITRACIN ZINC 500 UNIT/GM EX OINT
1.0000 "application " | TOPICAL_OINTMENT | CUTANEOUS | Status: DC
  Administered 2020-07-20 – 2020-07-26 (×4): 1 via TOPICAL
  Filled 2020-07-20 (×2): qty 0.9

## 2020-07-20 NOTE — Consult Note (Signed)
Hematology/Oncology Consult note [facility] [phone] [phone]  Patient Care Team: [name], MD as PCP - General (Physician Assistant) [name], MD as Referring Physician (Surgery) [name], MD as Consulting Physician (Oncology)   Name of the patient: Dana Perez  [id number]  30-Aug-1940   Date of visit: 07/20/20 REASON FOR COSULTATION:  Stage IV head and neck cancer admitted for acute CHF History of presenting illness-  79 y.o. female with PMH listed at below who presents to ER for evaluation of hypoxia and shortness of breath.  Patient was seen by her primary oncologist Dr. Cecille Aver yesterday for restarting immunotherapy for her stage IV head and cancer that has progressed recently with lung mets.  Patient was found to be hypoxic during the visits with pulse oximetry in the 70s.  Patient also has symptoms of shortness of breath and nonproductive cough.  No fever or chills. 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  She was previously treated with immunotherapy nivolumab-last dose March 2020.  She recently had a CT done on 06/26/2020 which showed -multiple new bilateral lung nodules, likely metastatic recurrence in the lungs.  Patient and her husband had a lengthy discussion with Dr. Cecille Aver on 07/19/2020 about options of reinitiating treatments, continue observation, versus hospice care.  Patient and husband elected to reinitiate treatments and that there was plan to proceed with nivolumab yesterday but she did not get treatment due to hypoxia.  Patient requires 100% nutrition via PEG tube. She also has a history of colon adenocarcinoma- malignant colon polyp diagnosed in 07/02/2017. Not surgical candidate due to performance status. Last CEA was checked pm 04/22/2018, normal.   In ER,  patient's hemoglobin is 15.3, hematocrit 48.9, white count 8.9.  Creatinine is 1.26, calcium 8.6, normal AST ALT. BNP is elevated at 521, troponin elevated at 20.  Respiratory panel negative for COVID-19 and influenza AMB EKG showed sinus rhythm, nonspecific T wave abnormality. 12 3 09/25/2019, 2D echocardiogram showed LVEF 65-70%, no left ventricle regional wall motion abnormalities.  Mild left ventricular hypertrophy.  Diastolic parameters are consistent with grade 1 diastolic dysfunction. Right ventricular systolic function is mildly reduced.  Right ventricular size is moderately enlarged.  Moderate pericardial effusion.  Mild mitral valve regurgitation, trivial aortic valve regurgitation.  Patient is admitted for management of respiratory failure secondary to CHF. Patient's husband is at bedside.  Patient is not able to provide me much history.  Most history was obtained from husband. Review of Systems  Unable to perform ROS: Acuity of condition  Shortness of breath  Allergies  Allergen Reactions  . Buprenorphine Nausea Only and Nausea And Vomiting  . Other Other (See Comments) and Rash    TDAP tdap TDAP  . Tetanus Antitoxin Rash    Patient Active Problem List   Diagnosis Date Noted  . Acute respiratory failure ([place]) 07/19/2020  . Acute CHF (congestive heart failure) ([place]) 07/19/2020  . COPD (chronic obstructive pulmonary disease) ([place]) 07/19/2020  . Acute lower UTI 07/19/2020  . Chronic obstructive pulmonary disease ([place]) 04/30/2020  . Gastrostomy tube dependent ([place]) 04/30/2020  . Chronic face pain (Right) 04/22/2020  . Right-sided face pain 04/22/2020  . Atypical face pain 04/22/2020  . Maxillary pain (Right) 04/22/2020  . Mandibular pain (Right) 04/22/2020  . Mouth pain 04/22/2020  . Chronic pain syndrome 03/29/2020  . Pharmacologic therapy 03/29/2020  .  Disorder of skeletal system 03/29/2020  . Problems influencing health status 03/29/2020  . Protein-calorie  malnutrition, severe 05/18/2019  . Skin lesion 05/18/2018  . Cancer associated pain 12/31/2017  . Chronic pain due to neoplasm 12/31/2017  . Oral candidiasis 11/15/2017  . Trimalleolar fracture of ankle, closed, left, with routine healing, subsequent encounter 09/27/2017  . Malnutrition of moderate degree 08/10/2017  . Closed left ankle fracture 08/07/2017  . Goals of care, counseling/discussion 07/14/2017  . Adenocarcinoma of colon ([place])   . Squamous cell carcinoma of head and neck ([place]) 07/01/2017  . CKD (chronic kidney disease) stage 3, GFR 30-59 ml/min 04/26/2017  . Erythrocytosis 07/17/2015  . Pure hypercholesterolemia 03/15/2015  . Polycythemia, secondary 12/26/2014  . Thyroid nodule 12/14/2014  . HLD (hyperlipidemia) 06/13/2014  . Hyperlipidemia, unspecified 06/13/2014  . Essential hypertension 06/13/2014  . Multiple thyroid nodules 01/22/2012     Past Medical History:  Diagnosis Date  . Acute hypoxemic respiratory failure ([place]) 11/15/2017  . Acute respiratory failure ([place]) 11/15/2017  . Adenocarcinoma of colon ([place])   . Benign neoplasm of ascending colon   . CAP (community acquired pneumonia) 11/15/2017  . Essential hypertension 06/13/2014  . Goals of care, counseling/discussion 07/14/2017  . Healthcare-associated pneumonia   . HLD (hyperlipidemia) 06/13/2014  . Hypercholesteremia   . Hyperlipidemia   . Melena   . Multiple thyroid nodules 01/22/2012  . Osteoporosis   . Polycythemia, secondary 12/26/2014  . Pure hypercholesterolemia 03/15/2015  . Squamous cell carcinoma of head and neck ([place])   . Thyroid nodule      Past Surgical History:  Procedure Laterality Date  . ABDOMINAL HYSTERECTOMY    . COLONOSCOPY WITH PROPOFOL N/A 07/02/2017   Procedure: COLONOSCOPY WITH PROPOFOL;  Surgeon: Lucilla Lame, MD;  Location: [facility];  Service: Endoscopy;  Laterality: N/A;  . ESOPHAGOGASTRODUODENOSCOPY N/A 05/17/2019   Procedure: ESOPHAGOGASTRODUODENOSCOPY (EGD);   Surgeon: Lin Landsman, MD;  Location: [facility] ENDOSCOPY;  Service: Gastroenterology;  Laterality: N/A;  . ORIF ANKLE FRACTURE Left 08/08/2017   Procedure: OPEN REDUCTION INTERNAL FIXATION (ORIF) ANKLE FRACTURE;  Surgeon: Dereck Leep, MD;  Location: [facility];  Service: Orthopedics;  Laterality: Left;  . POLYPECTOMY N/A 07/02/2017   Procedure: POLYPECTOMY;  Surgeon: Lucilla Lame, MD;  Location: [facility];  Service: Endoscopy;  Laterality: N/A;  . WRIST FRACTURE SURGERY  08/2009   badly broken    Social History   Socioeconomic History  . Marital status: Married    Spouse name: Not on file  . Number of children: Not on file  . Years of education: Not on file  . Highest education level: Not on file  Occupational History  . Not on file  Tobacco Use  . Smoking status: Former Smoker    Packs/day: 0.25    Types: Cigarettes    Quit date: 06/17/2017    Years since quitting: 3.0  . Smokeless tobacco: Never Used  Vaping Use  . Vaping Use: Never used  Substance and Sexual Activity  . Alcohol use: No  . Drug use: No  . Sexual activity: Not Currently  Other Topics Concern  . Not on file  Social History Narrative   Pt lives at home with husband   Social Determinants of Health   Financial Resource Strain:   . Difficulty of Paying Living Expenses: Not on file  Food Insecurity:   . Worried About Charity fundraiser in the Last Year: Not on file  . Ran Out of Food in the Last  Year: Not on file  Transportation Needs:   . Lack of Transportation (Medical): Not on file  . Lack of Transportation (Non-Medical): Not on file  Physical Activity:   . Days of Exercise per Week: Not on file  . Minutes of Exercise per Session: Not on file  Stress:   . Feeling of Stress : Not on file  Social Connections:   . Frequency of Communication with Friends and Family: Not on file  . Frequency of Social Gatherings with Friends and Family: Not on file  . Attends Religious Services: Not on  file  . Active Member of Clubs or Organizations: Not on file  . Attends Archivist Meetings: Not on file  . Marital Status: Not on file  Intimate Partner Violence:   . Fear of Current or Ex-Partner: Not on file  . Emotionally Abused: Not on file  . Physically Abused: Not on file  . Sexually Abused: Not on file     Family History  Problem Relation Age of Onset  . Heart disease Mother   . Diabetes Father      Current Facility-Administered Medications:  .  0.9 %  sodium chloride infusion, 250 mL, Intravenous, PRN, Agbata, Tochukwu, MD .  acetaminophen (TYLENOL) tablet 650 mg, 650 mg, Oral, Q6H PRN, Agbata, Tochukwu, MD .  cefTRIAXone (ROCEPHIN) 1 g in sodium chloride 0.9 % 100 mL IVPB, 1 g, Intravenous, Q24H, Agbata, Tochukwu, MD, Stopped at 07/20/20 0027 .  chlorpheniramine-HYDROcodone (TUSSIONEX) 10-8 MG/5ML suspension 5 mL, 5 mL, Per Tube, Q12H PRN, Agbata, Tochukwu, MD .  cholestyramine (QUESTRAN) packet 4 g, 4 g, Oral, TID WC, Agbata, Tochukwu, MD .  enoxaparin (LOVENOX) injection 40 mg, 40 mg, Subcutaneous, Q24H, Agbata, Tochukwu, MD, 40 mg at 07/19/20 2210 .  fentaNYL (DURAGESIC) 50 MCG/HR 1 patch, 1 patch, Transdermal, Q72H, Agbata, Tochukwu, MD .  furosemide (LASIX) injection 40 mg, 40 mg, Intravenous, Daily, Agbata, Tochukwu, MD .  hydrOXYzine (ATARAX/VISTARIL) tablet 10 mg, 10 mg, Per Tube, BID PRN, Agbata, Tochukwu, MD .  mirtazapine (REMERON) tablet 15 mg, 15 mg, Oral, QHS, Agbata, Tochukwu, MD .  morphine CONCENTRATE 10 MG/0.5ML oral solution 5 mg, 5 mg, Per Tube, Q4H PRN, Agbata, Tochukwu, MD .  ondansetron (ZOFRAN) injection 4 mg, 4 mg, Intravenous, Q6H PRN, Agbata, Tochukwu, MD, 4 mg at 07/20/20 0446 .  pravastatin (PRAVACHOL) tablet 10 mg, 10 mg, Per Tube, QHS, Agbata, Tochukwu, MD .  protein supplement (ENSURE MAX) liquid, , Oral, 5 X Daily, Agbata, Tochukwu, MD .  sodium chloride flush (NS) 0.9 % injection 3 mL, 3 mL, Intravenous, Q12H, Agbata, Tochukwu,  MD, 3 mL at 07/19/20 2211 .  sodium chloride flush (NS) 0.9 % injection 3 mL, 3 mL, Intravenous, PRN, Agbata, Tochukwu, MD  Current Outpatient Medications:  .  acetaminophen (TYLENOL) 500 MG tablet, Take 1,000 mg by mouth 2 (two) times daily as needed. 1000, 1600, Disp: , Rfl:  .  bacitracin ointment, Apply 1 application topically 2 (two) times daily as needed. , Disp: , Rfl:  .  chlorpheniramine-HYDROcodone (TUSSIONEX) 10-8 MG/5ML SUER, Place 5 mLs into feeding tube every 12 (twelve) hours as needed for cough. (Patient taking differently: Place 5 mLs into feeding tube every 12 (twelve) hours as needed for cough. 0700, 1900), Disp: 140 mL, Rfl: 0 .  clotrimazole-betamethasone (LOTRISONE) cream, Apply to when changing dressing, Disp: , Rfl:  .  Ensure Plus (ENSURE PLUS) LIQD, Give 1 carton of ensure plus 5 times daily via feeding tube.  Flush  with 75m of water before and after feeding., Disp: 1185 mL, Rfl: 0 .  fentaNYL (DURAGESIC) 50 MCG/HR, Place 1 patch onto the skin every 3 (three) days., Disp: 10 patch, Rfl: 0 .  hydrOXYzine (ATARAX/VISTARIL) 10 MG tablet, Place 10 mg into feeding tube 2 (two) times daily as needed. , Disp: , Rfl:  .  mirtazapine (REMERON) 15 MG tablet, Take 1 tablet (15 mg total) by mouth at bedtime. Crush and administer via feeding tube., Disp: 30 tablet, Rfl: 2 .  Morphine Sulfate (MORPHINE CONCENTRATE) 10 mg / 0.5 ml concentrated solution, Place 0.25 mLs (5 mg total) into feeding tube every 4 (four) hours as needed for moderate pain or severe pain. (Patient taking differently: Place 5 mg into feeding tube every 4 (four) hours as needed for moderate pain or severe pain. 0700, 1300, 1900), Disp: 30 mL, Rfl: 0 .  ondansetron (ZOFRAN) 8 MG tablet, Place 1 tablet (8 mg total) into feeding tube every 8 (eight) hours as needed for nausea or vomiting., Disp: 45 tablet, Rfl: 0 .  pravastatin (PRAVACHOL) 10 MG tablet, Place 10 mg into feeding tube daily. , Disp: , Rfl:  .   prochlorperazine (COMPAZINE) 10 MG tablet, PLACE 1 TABLET (10 MG TOTAL) INTO FEEDING TUBE EVERY 6 (SIX) HOURS AS NEEDED FOR NAUSEA OR VOMITING., Disp: 60 tablet, Rfl: 2 .  cholestyramine (QUESTRAN) 4 GM/DOSE powder, Take 1 packet (4 g total) by mouth 3 (three) times daily with meals. (Patient not taking: Reported on 07/19/2020), Disp: 378 g, Rfl: 12 .  NARCAN 4 MG/0.1ML LIQD nasal spray kit, USE 1 SPRAY PER INTRANASAL ROUTE AS DIRECTED, Disp: , Rfl: 0  Facility-Administered Medications Ordered in Other Encounters:  .  heparin lock flush 100 unit/mL, 250 Units, Intracatheter, PRN, PMa Hillock Sandeep, MD .  heparin lock flush 100 unit/mL, 500 Units, Intracatheter, PRN, PMa Hillock Sandeep, MD .  potassium chloride 20 mEq in sodium chloride 0.9 % 500 mL injection, , Intravenous, Once, Saunders, Doni H, RN .  sodium chloride 0.9 % injection 10 mL, 10 mL, Intracatheter, PRN, PMa Hillock Sandeep, MD .  sodium chloride 0.9 % injection 10 mL, 10 mL, Intracatheter, PRN, PMa Hillock Sandeep, MD .  sodium chloride 0.9 % injection 10 mL, 10 mL, Intracatheter, PRN, Pandit, Sandeep, MD .  sodium chloride 0.9 % injection 10 mL, 10 mL, Intracatheter, PRN, PLeia Alf MD   Physical exam:  Vitals:   07/20/20 0300 07/20/20 0330 07/20/20 0500 07/20/20 0530  BP: (!) 142/85 140/79 (!) 152/79 (!) 157/76  Pulse: 97 93 93 96  Resp: _0 (!) 23  Temp:      TempSrc:      SpO2: 97% 92% 92% 90%  Weight:      Height:       Physical Exam Constitutional:      General: She is not in acute distress.    Appearance: She is not diaphoretic.     Comments: Thin built female lying in the bed  HENT:     Head: Normocephalic.     Nose: Nose normal.     Mouth/Throat:     Mouth: Mucous membranes are dry.     Pharynx: No oropharyngeal exudate.  Eyes:     General: No scleral icterus.    Pupils: Pupils are equal, round, and reactive to light.  Cardiovascular:     Rate and Rhythm: Normal rate and regular rhythm.     Heart sounds:  No murmur heard.   Pulmonary:  Effort: Pulmonary effort is normal. No respiratory distress.     Breath sounds: No rales.     Comments: Decreased breath sound bilaterally. Chest:     Chest wall: No tenderness.  Abdominal:     General: There is no distension.     Palpations: Abdomen is soft.     Tenderness: There is no abdominal tenderness.  Musculoskeletal:        General: Normal range of motion.     Cervical back: Normal range of motion.  Skin:    General: Skin is warm and dry.     Findings: No erythema.  Neurological:     Mental Status: She is alert.     Cranial Nerves: No cranial nerve deficit.     Motor: No abnormal muscle tone.     Coordination: Coordination normal.     Comments: Patient does not communicate verbally with me today.  She nods for simple yes and no answers.   Psychiatric:        Mood and Affect: Affect normal.         CMP Latest Ref Rng & Units 07/20/2020  Glucose 70 - 99 mg/dL 94  BUN 8 - 23 mg/dL 42(H)  Creatinine 0.44 - 1.00 mg/dL 1.24(H)  Sodium 135 - 145 mmol/L 146(H)  Potassium 3.5 - 5.1 mmol/L 3.6  Chloride 98 - 111 mmol/L 105  CO2 22 - 32 mmol/L 28  Calcium 8.9 - 10.3 mg/dL 8.2(L)  Total Protein 6.5 - 8.1 g/dL -  Total Bilirubin 0.3 - 1.2 mg/dL -  Alkaline Phos 38 - 126 U/L -  AST 15 - 41 U/L -  ALT 0 - 44 U/L -   CBC Latest Ref Rng & Units 07/19/2020  WBC 4.0 - 10.5 K/uL 8.9  Hemoglobin 12.0 - 15.0 g/dL 15.3(H)  Hematocrit 36 - 46 % 48.9(H)  Platelets 150 - 400 K/uL 371    RADIOGRAPHIC STUDIES: I have personally reviewed the radiological images as listed and agreed with the findings in the report. CT SOFT TISSUE NECK W CONTRAST  Result Date: 06/26/2020 CLINICAL DATA:  Follow-up head and neck cancer. Squamous cell carcinoma. Surveillance imaging. EXAM: CT NECK WITH CONTRAST TECHNIQUE: Multidetector CT imaging of the neck was performed using the standard protocol following the bolus administration of intravenous contrast. Not  utilized. Stenosis is probably in the range of 50% on both sides. Diminutive right jugular vein. CONTRAST:  129m OMNIPAQUE IOHEXOL 300 MG/ML  SOLN COMPARISON:  12/07/2019.  06/07/2019.  05/08/2017. FINDINGS: Pharynx and larynx: No mucosal or submucosal lesion. Salivary glands: Left submandibular gland is diminutive but normal. Right submandibular gland surgically absent. Parotid glands are normal. Thyroid: Normal Lymph nodes: No residual or recurrent lymphadenopathy. Previous right mandibular resection with regional flap reconstruction. Similar appearance of the soft tissues of the region without evidence of recurrent mass. Vascular: Bilateral carotid atherosclerotic disease at the bifurcations. CT angiographic technique was not utilized. Stenosis estimated at approximately 50% on both sides. Diminutive right jugular vein. Limited intracranial: Negative Visualized orbits: Negative Mastoids and visualized paranasal sinuses: Clear Skeleton: Ordinary cervical spondylosis. Right mandibular resection as noted above. No malignant bone findings. Upper chest: See results of chest CT. 1.5 cm density right upper lobe. Other: None IMPRESSION: 1. Previous right mandibular resection with regional flap reconstruction. Similar appearance of the soft tissues of the region without evidence of recurrent mass. No residual or recurrent lymphadenopathy. 2. Bilateral carotid atherosclerotic disease. Stenosis estimated at approximately 50% on both sides. CT angiographic technique was  not utilized. 3. 1.5 cm density right upper lobe.  See results of chest CT. Electronically Signed   By: Nelson Chimes M.D.   On: 06/26/2020 11:20   CT CHEST ABDOMEN PELVIS W CONTRAST  Result Date: 06/26/2020 CLINICAL DATA:  Metastatic head and neck cancer, history of colon cancer EXAM: CT CHEST, ABDOMEN, AND PELVIS WITH CONTRAST TECHNIQUE: Multidetector CT imaging of the chest, abdomen and pelvis was performed following the standard protocol during bolus  administration of intravenous contrast. CONTRAST:  125m OMNIPAQUE IOHEXOL 300 MG/ML SOLN, additional oral enteric contrast COMPARISON:  12/07/2019 FINDINGS: CT CHEST FINDINGS Cardiovascular: Aortic atherosclerosis. Normal heart size. Left and right coronary artery calcifications. Small pericardial effusion, unchanged. Mediastinum/Nodes: No enlarged mediastinal, hilar, or axillary lymph nodes. Thyroid gland, trachea, and esophagus demonstrate no significant findings. Lungs/Pleura: Severe centrilobular emphysema. There are multiple new bilateral pulmonary nodules, in the peripheral right apex measuring 1.5 x 1.4 cm (series 8, image 24), in the anterior right middle lobe measuring 1.8 x 1.2 cm (series 8, image 84) and in the superior segment left lower lobe measuring 2.2 x 1.7 cm (series 8, image 82). No pleural effusion or pneumothorax. Musculoskeletal: No chest wall mass or suspicious bone lesions identified. CT ABDOMEN PELVIS FINDINGS Hepatobiliary: No solid liver abnormality is seen. No gallstones, gallbladder wall thickening, or biliary dilatation. Pancreas: Unremarkable. No pancreatic ductal dilatation or surrounding inflammatory changes. Spleen: Normal in size without significant abnormality. Adrenals/Urinary Tract: Adrenal glands are unremarkable. Kidneys are normal, without renal calculi, solid lesion, or hydronephrosis. Bladder is unremarkable. Stomach/Bowel: Percutaneous gastrostomy. Stomach is otherwise within normal limits. Appendix is not clearly visualized. No evidence of bowel wall thickening, distention, or inflammatory changes. Vascular/Lymphatic: No significant vascular findings are present. No enlarged abdominal or pelvic lymph nodes. Reproductive: Status post hysterectomy. Other: No abdominal wall hernia or abnormality. No abdominopelvic ascites. Musculoskeletal: No acute or significant osseous findings. IMPRESSION: 1. There are multiple new bilateral pulmonary nodules, most consistent with new  pulmonary metastatic disease although conceivably infectious or inflammatory in nature. Consider tissue sampling. 2. No evidence of metastatic disease within the abdomen or pelvis. 3. Emphysema (ICD10-J43.9). 4. Small, unchanged pericardial effusion. 5. Coronary artery disease.  Aortic Atherosclerosis (ICD10-I70.0). Electronically Signed   By: AEddie CandleM.D.   On: 06/26/2020 15:57   DG Chest Port 1 View  Result Date: 07/19/2020 CLINICAL DATA:  79year old female with shortness of breath. EXAM: PORTABLE CHEST 1 VIEW COMPARISON:  Chest radiograph dated 11/15/2017. FINDINGS: There is cardiomegaly with vascular congestion and edema. Small bilateral pleural effusions, right greater left, with bibasilar atelectasis. Pneumonia is not excluded clinical correlation is recommended. No pneumothorax. Atherosclerotic calcification of the aorta. No acute osseous pathology. IMPRESSION: Cardiomegaly with findings of CHF and small bilateral pleural effusions. Pneumonia is not excluded. Electronically Signed   By: AAnner CreteM.D.   On: 07/19/2020 15:32    Assessment and plan-   Acute hypoxia respiratory failure- likely due to CHF Elevated BNP and troponin. appreciate cardiology input.   Stage IV head and neck squamous lung cancer with CT evidence of new lung nodules, which likely due to cancer recurrence. She also has a history of colon cancer- malignant colon polyp and not surgical candidate due to poor performance status.  Recommend consulting palliative care  She can follow up with Dr.Finnegan outpatient for further discussion of treatment of cancer recurrence.   PEG tube, continue tube feeding.     Thank you for allowing me to participate in the care of this patient.  Earlie Server, MD, PhD Hematology Oncology [facility] Pager- [phone] 07/20/2020

## 2020-07-20 NOTE — Consult Note (Signed)
CARDIOLOGY CONSULT NOTE               Patient ID: Dana Perez MRN: [id number] DOB/AGE: [birth year] 79 y.o.  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  Patient denies any cardiac type symptoms with admitted for further evaluation BNP was slightly elevated D-dimer was nondiagnostic.  Now admitted for further cardiac assessment with flat troponins  Review of systems complete and found to be negative unless listed above     Past Medical History:  Diagnosis Date  . Acute hypoxemic respiratory failure ([place]) 11/15/2017  . Acute respiratory failure ([place]) 11/15/2017  . Adenocarcinoma of colon ([place])   . Benign neoplasm of ascending colon   . CAP (community acquired pneumonia) 11/15/2017  . Essential hypertension 06/13/2014  . Goals of care, counseling/discussion 07/14/2017  . Healthcare-associated pneumonia   . HLD (hyperlipidemia) 06/13/2014  . Hypercholesteremia   . Hyperlipidemia   . Melena   . Multiple thyroid nodules 01/22/2012  . Osteoporosis   . Polycythemia, secondary 12/26/2014  . Pure hypercholesterolemia 03/15/2015  . Squamous cell carcinoma of head and neck ([place])   . Thyroid nodule     Past Surgical History:  Procedure Laterality Date  .  ABDOMINAL HYSTERECTOMY    . COLONOSCOPY WITH PROPOFOL N/A 07/02/2017   Procedure: COLONOSCOPY WITH PROPOFOL;  Surgeon: Lucilla Lame, MD;  Location: [place];  Service: Endoscopy;  Laterality: N/A;  . ESOPHAGOGASTRODUODENOSCOPY N/A 05/17/2019   Procedure: ESOPHAGOGASTRODUODENOSCOPY (EGD);  Surgeon: Lin Landsman, MD;  Location: [facility] ENDOSCOPY;  Service: Gastroenterology;  Laterality: N/A;  . ORIF ANKLE FRACTURE Left 08/08/2017   Procedure: OPEN REDUCTION INTERNAL FIXATION (ORIF) ANKLE FRACTURE;  Surgeon: Dereck Leep, MD;  Location: [facility];  Service: Orthopedics;  Laterality: Left;  . POLYPECTOMY N/A 07/02/2017   Procedure: POLYPECTOMY;  Surgeon: Lucilla Lame, MD;  Location: [place];  Service: Endoscopy;  Laterality: N/A;  . WRIST FRACTURE SURGERY  08/2009   badly broken    (Not in a hospital admission)  Social History   Socioeconomic History  . Marital status: Married    Spouse name: Not on file  . Number of children: Not on file  . Years of education: Not on file  . Highest education level: Not on file  Occupational History  . Not on file  Tobacco Use  . Smoking status: Former Smoker    Packs/day: 0.25    Types: Cigarettes    Quit date: 06/17/2017    Years since quitting: 3.0  . Smokeless tobacco: Never Used  Vaping Use  . Vaping Use: Never used  Substance and Sexual Activity  . Alcohol use: No  . Drug use: No  . Sexual activity: Not Currently  Other Topics Concern  . Not on file  Social History Narrative   Pt lives at home with husband   Social Determinants of Radio broadcast assistant  Strain:   . Difficulty of Paying Living Expenses: Not on file  Food Insecurity:   . Worried About Charity fundraiser in the Last Year: Not on file  . Ran Out of Food in the Last Year: Not on file  Transportation Needs:   . Lack of Transportation (Medical): Not on file  . Lack of Transportation (Non-Medical): Not on file  Physical Activity:   .  Days of Exercise per Week: Not on file  . Minutes of Exercise per Session: Not on file  Stress:   . Feeling of Stress : Not on file  Social Connections:   . Frequency of Communication with Friends and Family: Not on file  . Frequency of Social Gatherings with Friends and Family: Not on file  . Attends Religious Services: Not on file  . Active Member of Clubs or Organizations: Not on file  . Attends Archivist Meetings: Not on file  . Marital Status: Not on file  Intimate Partner Violence:   . Fear of Current or Ex-Partner: Not on file  . Emotionally Abused: Not on file  . Physically Abused: Not on file  . Sexually Abused: Not on file    Family History  Problem Relation Age of Onset  . Heart disease Mother   . Diabetes Father       Review of systems complete and found to be negative unless listed above      PHYSICAL EXAM  General: Well developed, well nourished, in no acute distress HEENT:  Normocephalic and atramatic Neck:  No JVD.  Lungs: Clear bilaterally to auscultation and percussion. Heart: HRRR . Normal S1 and S2 without gallops or murmurs.  Abdomen: Bowel sounds are positive, abdomen soft and non-tender  Msk:  Back normal, normal gait. Normal strength and tone for age. Extremities: No clubbing, cyanosis or edema.   Neuro: Alert and oriented X 3. Psych:  Good affect, responds appropriately  Labs:   Lab Results  Component Value Date   WBC 8.9 07/19/2020   HGB 15.3 (H) 07/19/2020   HCT 48.9 (H) 07/19/2020   MCV 95.7 07/19/2020   PLT 371 07/19/2020    Recent Labs  Lab 07/19/20 0944 07/19/20 1454 07/20/20 0447  NA 142   < > 146*  K 4.4   < > 3.6  CL 105   < > 105  CO2 25   < > 28  BUN 51*   < > 42*  CREATININE 1.36*   < > 1.24*  CALCIUM 8.6*   < > 8.2*  PROT 7.3  --   --   BILITOT 0.5  --   --   ALKPHOS 115  --   --   ALT 19  --   --   AST 31  --   --   GLUCOSE 137*   < > 94   < > = values in this interval not displayed.   Lab  Results  Component Value Date   TROPONINI 0.04 ([place]) 11/15/2017   No results found for: CHOL No results found for: HDL No results found for: LDLCALC No results found for: TRIG No results found for: CHOLHDL No results found for: LDLDIRECT    Radiology: CT SOFT TISSUE NECK W CONTRAST  Result Date: 06/26/2020 CLINICAL DATA:  Follow-up head and neck cancer. Squamous cell carcinoma. Surveillance imaging. EXAM: CT NECK WITH CONTRAST TECHNIQUE: Multidetector CT imaging of the neck was performed using the standard protocol following the bolus administration of intravenous contrast.  Not utilized. Stenosis is probably in the range of 50% on both sides. Diminutive right jugular vein. CONTRAST:  173mL OMNIPAQUE IOHEXOL 300 MG/ML  SOLN COMPARISON:  12/07/2019.  06/07/2019.  05/08/2017. FINDINGS: Pharynx and larynx: No mucosal or submucosal lesion. Salivary glands: Left submandibular gland is diminutive but normal. Right submandibular gland surgically absent. Parotid glands are normal. Thyroid: Normal Lymph nodes: No residual or recurrent lymphadenopathy. Previous right mandibular resection with regional flap reconstruction. Similar appearance of the soft tissues of the region without evidence of recurrent mass. Vascular: Bilateral carotid atherosclerotic disease at the bifurcations. CT angiographic technique was not utilized. Stenosis estimated at approximately 50% on both sides. Diminutive right jugular vein. Limited intracranial: Negative Visualized orbits: Negative Mastoids and visualized paranasal sinuses: Clear Skeleton: Ordinary cervical spondylosis. Right mandibular resection as noted above. No malignant bone findings. Upper chest: See results of chest CT. 1.5 cm density right upper lobe. Other: None IMPRESSION: 1. Previous right mandibular resection with regional flap reconstruction. Similar appearance of the soft tissues of the region without evidence of recurrent mass. No residual or recurrent  lymphadenopathy. 2. Bilateral carotid atherosclerotic disease. Stenosis estimated at approximately 50% on both sides. CT angiographic technique was not utilized. 3. 1.5 cm density right upper lobe.  See results of chest CT. Electronically Signed   By: Nelson Chimes M.D.   On: 06/26/2020 11:20   CT CHEST ABDOMEN PELVIS W CONTRAST  Result Date: 06/26/2020 CLINICAL DATA:  Metastatic head and neck cancer, history of colon cancer EXAM: CT CHEST, ABDOMEN, AND PELVIS WITH CONTRAST TECHNIQUE: Multidetector CT imaging of the chest, abdomen and pelvis was performed following the standard protocol during bolus administration of intravenous contrast. CONTRAST:  172mL OMNIPAQUE IOHEXOL 300 MG/ML SOLN, additional oral enteric contrast COMPARISON:  12/07/2019 FINDINGS: CT CHEST FINDINGS Cardiovascular: Aortic atherosclerosis. Normal heart size. Left and right coronary artery calcifications. Small pericardial effusion, unchanged. Mediastinum/Nodes: No enlarged mediastinal, hilar, or axillary lymph nodes. Thyroid gland, trachea, and esophagus demonstrate no significant findings. Lungs/Pleura: Severe centrilobular emphysema. There are multiple new bilateral pulmonary nodules, in the peripheral right apex measuring 1.5 x 1.4 cm (series 8, image 24), in the anterior right middle lobe measuring 1.8 x 1.2 cm (series 8, image 84) and in the superior segment left lower lobe measuring 2.2 x 1.7 cm (series 8, image 82). No pleural effusion or pneumothorax. Musculoskeletal: No chest wall mass or suspicious bone lesions identified. CT ABDOMEN PELVIS FINDINGS Hepatobiliary: No solid liver abnormality is seen. No gallstones, gallbladder wall thickening, or biliary dilatation. Pancreas: Unremarkable. No pancreatic ductal dilatation or surrounding inflammatory changes. Spleen: Normal in size without significant abnormality. Adrenals/Urinary Tract: Adrenal glands are unremarkable. Kidneys are normal, without renal calculi, solid lesion, or  hydronephrosis. Bladder is unremarkable. Stomach/Bowel: Percutaneous gastrostomy. Stomach is otherwise within normal limits. Appendix is not clearly visualized. No evidence of bowel wall thickening, distention, or inflammatory changes. Vascular/Lymphatic: No significant vascular findings are present. No enlarged abdominal or pelvic lymph nodes. Reproductive: Status post hysterectomy. Other: No abdominal wall hernia or abnormality. No abdominopelvic ascites. Musculoskeletal: No acute or significant osseous findings. IMPRESSION: 1. There are multiple new bilateral pulmonary nodules, most consistent with new pulmonary metastatic disease although conceivably infectious or inflammatory in nature. Consider tissue sampling. 2. No evidence of metastatic disease within the abdomen or pelvis. 3. Emphysema (ICD10-J43.9). 4. Small, unchanged pericardial effusion. 5. Coronary artery disease.  Aortic Atherosclerosis (ICD10-I70.0). Electronically Signed   By: Eddie Candle M.D.   On: 06/26/2020 15:57   DG Chest [facility]  1 View  Result Date: 07/19/2020 CLINICAL DATA:  79 year old female with shortness of breath. EXAM: PORTABLE CHEST 1 VIEW COMPARISON:  Chest radiograph dated 11/15/2017. FINDINGS: There is cardiomegaly with vascular congestion and edema. Small bilateral pleural effusions, right greater left, with bibasilar atelectasis. Pneumonia is not excluded clinical correlation is recommended. No pneumothorax. Atherosclerotic calcification of the aorta. No acute osseous pathology. IMPRESSION: Cardiomegaly with findings of CHF and small bilateral pleural effusions. Pneumonia is not excluded. Electronically Signed   By: Anner Crete M.D.   On: 07/19/2020 15:32    EKG: Normal sinus rhythm nonspecific ST-T changes  ASSESSMENT AND PLAN:  Shortness of breath Congestive heart failure Generalized fatigue History of head neck cancer Hypertension Chronic renal insufficiency Pleural effusion Elevated BNP   Hyperlipidemia COPD Urinary tract infection Hypoxemia . Plan Agree with admission for respiratory support Supplemental oxygen as necessary Rule out myocardial infarction follow-up EKGs Mild heart failure with elevated BNP Recommend echocardiogram for assessment of left ventricular wall motion and overall function Consider low-dose Lasix Do not recommend any invasive strategy at this point Follow-up renal function Continue inhalers supplemental oxygen possible steroids for COPD Agree with antibiotic therapy for possible UTI Recommend conservative cardiac input at this stage    Signed: Yolonda Kida MD 07/20/2020, 9:56 AM

## 2020-07-20 NOTE — Progress Notes (Signed)
*  PRELIMINARY RESULTS* Echocardiogram 2D Echocardiogram has been performed.  Dana Perez 07/20/2020, 10:30 AM

## 2020-07-20 NOTE — Progress Notes (Signed)
Initial Nutrition Assessment  DOCUMENTATION CODES:   Severe malnutrition in context of chronic illness  INTERVENTION:   Ensure Plus vanilla- Give 1 bottle, 5 times daily via tube, each supplement provides 350 kcal and 13 grams of protein- Flush with 50m of water before and after each feed  Pro-Source 466mBID via tube, provides 40kcal and 11g of protein per serving   Regimen provides 1830kcal/day, 87g/day protein and [id number] free water   NUTRITION DIAGNOSIS:   Severe Malnutrition related to cancer and cancer related treatments, other (see comment) (COPD) as evidenced by moderate fat depletion, severe fat depletion, severe muscle depletion.  GOAL:   Patient will meet greater than or equal to 90% of their needs  MONITOR:   Weight trends, Labs, TF tolerance, Skin, I & O's  REASON FOR ASSESSMENT:   Consult Enteral/tube feeding initiation and management  ASSESSMENT:   [age]o. 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   Met with pt and pt's husband in room today. Pt is lethargic and sleeping so history obtained from pt's husband at bedside. Husband reports that pt has had a [facility] since 09/2017 [facility] has been receiving home tube feeds of Ensure Plus, 5 cans daily via tube. Pt is followed by the Dietitian at the cancer center. Spoke with RD at the cancer center. Pt [facility] she was not tolerating the Osmolite; pt has been on Ensure for two years. Per chart, pt with 26lb weight gain since having her tube placed; RD unsure of pt's true weight as pt is admitted with volume overload. Husband with concerns of pt's weight gain today; husband reports that if patient gets too  heavy, he is unable to lift her and care or her at home. Pt with severe fat and muscle depletions on exam today. RD discussed with pt's husband today, the importance of adequate nutrition needed to preserve lean muscle. Will plan to continue home tube feed regimen and add protein modulars for additional protein. Husband agrees to plan to add protein modulars and wants everything discussed with his regular Dietitian; RD did call and discuss this case [facility]   Medications reviewed and include: questran, lovenox, lasix, remeron, ceftriaxone   Labs reviewed: Na 146(H), BUN 42(H), creat 1.24(H) BNP- 521.0(H)  NUTRITION - FOCUSED PHYSICAL EXAM:    Most Recent Value  Orbital Region Severe depletion  Upper Arm Region Mild depletion  Thoracic and Lumbar Region Severe depletion  Buccal Region Mild depletion  Temple Region Severe depletion  Clavicle Bone Region Severe depletion  Clavicle and Acromion Bone Region Severe depletion  Scapular Bone Region Severe depletion  Dorsal Hand Severe depletion  Patellar Region Severe depletion  Anterior Thigh Region Severe depletion  Posterior Calf Region Severe depletion  Edema (RD Assessment) None  Hair Reviewed  Eyes Reviewed  Mouth Reviewed  Skin Reviewed  Nails Reviewed     Diet Order:   Diet Order            Diet NPO time specified  Diet effective now                EDUCATION NEEDS:   Education needs have been addressed  Skin:  Skin Assessment: Reviewed RN Assessment  Last BM:  12/1  Height:   Ht Readings from Last 1 Encounters:  07/19/20 _0  (1.651 m)    Weight:   Wt Readings from Last 1 Encounters:  07/19/20 62.6 kg    Ideal Body Weight:  56.8 kg  BMI:  Body mass index is 22.96 kg/m.  Estimated Nutritional Needs:   Kcal:  1600-1800kcal/day  Protein:  80-90g/day  Fluid:  1.5L/day  Koleen Distance MS, RD, LDN Please refer to [facility] for RD and/or RD on-call/weekend/after hours  pager

## 2020-07-20 NOTE — ED Notes (Signed)
Patient brief and bed linens changed.

## 2020-07-20 NOTE — Progress Notes (Signed)
Progress Note    Dana Perez  CWC:[id number] DOB: June 29, 1941  DOA: 07/19/2020 PCP: [name], MD      Brief Narrative:    Medical records reviewed and are as summarized below:  Dana Perez is a 79 y.o. 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  She had gone to the cancer center for her 45-month routine evaluation and to discuss imaging results.  Patient was noted to be hypoxic during the visit and pulse oximetry was in the 70s.  She was placed on oxygen via nasal cannula.       Assessment/Plan:   Principal Problem:   Acute respiratory failure (HCC) Active Problems:   Essential hypertension   Squamous cell carcinoma of head and neck (HCC)   CKD (chronic kidney disease) stage 3, GFR 30-59 ml/min   Acute CHF (congestive heart failure) (HCC)   COPD (chronic obstructive pulmonary disease) (HCC)   Acute lower UTI   Nutrition Problem: Severe Malnutrition Etiology: cancer and cancer related treatments, other (see comment) (COPD)  Signs/Symptoms: moderate fat depletion, severe fat depletion, severe muscle depletion   Body mass index is 22.96 kg/m.    Acute hypoxemic respiratory failure: She is requiring 10 L/min via high flow nasal cannula.  Taper down oxygen as able.  Acute diastolic CHF, small bilateral pleural effusions: Continue IV Lasix.  Monitor BMP, daily weight and urine output.  2D echo showed EF estimated at 65 to 76%, grade 1 diastolic dysfunction, moderate pericardial effusion with a cardiologist.  Suspected UTI: Continue IV Rocephin.  Follow-up urine cultures.  Stage IV head and neck cancer with metastasis to the lungs: Follow-up with oncologist.  Continue enteral nutrition via PEG tube.   Dietitian has been consulted to assist with nutrition.  Continue analgesics for pain.  Follow-up with oncologist.  CKD stage IIIa: Creatinine is stable.    Hypertension: Continue Lasix  Mildly elevated troponin: This is likely from demand ischemia.  Goals of care: Discussed CODE STATUS with the patient and her husband.  Her husband wants her to remain a full code at this time with plan to continue cancer treatment when she is discharged from the hospital.   Diet Order            Diet NPO time specified  Diet effective now                    Consultants:  Cardiologist  Oncologist  Procedures:  None    Medications:    bacitracin  1 application Topical QODAY   cholestyramine  4 g Oral TID WC   clotrimazole   Topical QODAY   enoxaparin (LOVENOX) injection  40 mg Subcutaneous Q24H   feeding supplement  237 mL Per Tube 5 X Daily   feeding supplement (PROSource TF)  45 mL Per Tube BID   [START ON 07/21/2020] fentaNYL  1 patch Transdermal Q72H   free water  100 mL Per Tube 5 X Daily   furosemide  40 mg Intravenous Daily   mirtazapine  15 mg Oral QHS   pravastatin  10 mg Per Tube QHS   sodium chloride flush  3 mL Intravenous Q12H   Continuous Infusions:  sodium chloride     cefTRIAXone (ROCEPHIN)  IV Stopped (  07/20/20 0027)     Anti-infectives (From admission, onward)   Start     Dose/Rate Route Frequency Ordered Stop   07/19/20 2000  cefTRIAXone (ROCEPHIN) 1 g in sodium chloride 0.9 % 100 mL IVPB        1 g 200 mL/hr over 30 Minutes Intravenous Every 24 hours 07/19/20 1833               Family Communication/Anticipated D/C date and plan/Code Status   DVT prophylaxis: enoxaparin (LOVENOX) injection 40 mg Start: 07/19/20 2200     Code Status: Full Code  Family Communication: Plan discussed with husband at the bedside Disposition Plan:    Status is: Inpatient  Remains inpatient appropriate because:IV treatments appropriate due to  intensity of illness or inability to take PO and Inpatient level of care appropriate due to severity of illness   Dispo: The patient is from: Home              Anticipated d/c is to: Home              Anticipated d/c date is: 3 days              Patient currently is not medically stable to d/c.           Subjective:   Interval events noted.  She looks weak and she is unable to provide any history at this time.  Her husband is at the bedside who helps with the history.  Objective:    Vitals:   07/20/20 0936 07/20/20 1130 07/20/20 1157 07/20/20 1335  BP: (!) 153/81 (!) 166/88  (!) 150/71  Pulse: 92 94  92  Resp: 17 20  16   Temp:   (!) 97.5 F (36.4 C)   TempSrc:    Oral  SpO2: 93% 92%  96%  Weight:      Height:       No data found.   Intake/Output Summary (Last 24 hours) at 07/20/2020 1654 Last data filed at 07/20/2020 1157 Gross per 24 hour  Intake 100 ml  Output 950 ml  Net -850 ml   Filed Weights   07/19/20 1445  Weight: 62.6 kg    Exam:  GEN: NAD, ill-looking SKIN: Warm and dry EYES: EOMI ENT: MMM CV: RRR PULM: Bilateral expiratory wheezing.  No rales heard.  B ABD: soft, ND, NT, +BS CNS: AAO x 3, non focal EXT: No edema or tenderness   Data Reviewed:   I have personally reviewed following labs and imaging studies:  Labs: Labs show the following:   Basic Metabolic Panel: Recent Labs  Lab 07/19/20 0944 07/19/20 0944 07/19/20 1454 07/20/20 0447  NA 142  --  143 146*  K 4.4   < > 4.0 3.6  CL 105  --  105 105  CO2 25  --  25 28  GLUCOSE 137*  --  115* 94  BUN 51*  --  48* 42*  CREATININE 1.36*  --  1.26* 1.24*  CALCIUM 8.6*  --  8.6* 8.2*  MG  --   --   --  2.7*   < > = values in this interval not displayed.   GFR Estimated Creatinine Clearance: 34.2 mL/min (A) (by C-G formula based on SCr of 1.24 mg/dL (H)). Liver Function Tests: Recent Labs  Lab 07/19/20 0944  AST 31  ALT 19  ALKPHOS 115  BILITOT 0.5  PROT 7.3  ALBUMIN  3.6   No results for input(s): LIPASE,  AMYLASE in the last 168 hours. No results for input(s): AMMONIA in the last 168 hours. Coagulation profile No results for input(s): INR, PROTIME in the last 168 hours.  CBC: Recent Labs  Lab 07/19/20 0944 07/19/20 1454  WBC 9.8 8.9  NEUTROABS 8.3*  --   HGB 15.6* 15.3*  HCT 50.0* 48.9*  MCV 95.6 95.7  PLT 385 371   Cardiac Enzymes: No results for input(s): CKTOTAL, CKMB, CKMBINDEX, TROPONINI in the last 168 hours. BNP (last 3 results) No results for input(s): PROBNP in the last 8760 hours. CBG: No results for input(s): GLUCAP in the last 168 hours. D-Dimer: No results for input(s): DDIMER in the last 72 hours. Hgb A1c: No results for input(s): HGBA1C in the last 72 hours. Lipid Profile: No results for input(s): CHOL, HDL, LDLCALC, TRIG, CHOLHDL, LDLDIRECT in the last 72 hours. Thyroid function studies: Recent Labs    07/19/20 1223  TSH 1.000  T4TOTAL 9.8   Anemia work up: No results for input(s): VITAMINB12, FOLATE, FERRITIN, TIBC, IRON, RETICCTPCT in the last 72 hours. Sepsis Labs: Recent Labs  Lab 07/19/20 0944 07/19/20 1454  WBC 9.8 8.9    Microbiology Recent Results (from the past 240 hour(s))  Resp Panel by RT-PCR (Flu A&B, Covid) Nasopharyngeal Swab     Status: None   Collection Time: 07/19/20  3:16 PM   Specimen: Nasopharyngeal Swab; Nasopharyngeal(NP) swabs in vial transport medium  Result Value Ref Range Status   SARS Coronavirus 2 by RT PCR NEGATIVE NEGATIVE Final    Comment: (NOTE) SARS-CoV-2 target nucleic acids are NOT DETECTED.  The SARS-CoV-2 RNA is generally detectable in upper respiratory specimens during the acute phase of infection. The lowest concentration of SARS-CoV-2 viral copies this assay can detect is 138 copies/mL. A negative result does not preclude SARS-Cov-2 infection and should not be used as the sole basis for treatment or other patient management decisions. A negative result may  occur with  improper specimen collection/handling, submission of specimen other than nasopharyngeal swab, presence of viral mutation(s) within the areas targeted by this assay, and inadequate number of viral copies(<138 copies/mL). A negative result must be combined with clinical observations, patient history, and epidemiological information. The expected result is Negative.  Fact Sheet for Patients:  EntrepreneurPulse.com.au  Fact Sheet for Healthcare Providers:  IncredibleEmployment.be  This test is no t yet approved or cleared by the [place] FDA and  has been authorized for detection and/or diagnosis of SARS-CoV-2 by FDA under an Emergency Use Authorization (EUA). This EUA will remain  in effect (meaning this test can be used) for the duration of the COVID-19 declaration under Section 564(b)(1) of the Act, 21 U.S.C.section 360bbb-3(b)(1), unless the authorization is terminated  or revoked sooner.       Influenza A by PCR NEGATIVE NEGATIVE Final   Influenza B by PCR NEGATIVE NEGATIVE Final    Comment: (NOTE) The Xpert Xpress SARS-CoV-2/FLU/RSV plus assay is intended as an aid in the diagnosis of influenza from Nasopharyngeal swab specimens and should not be used as a sole basis for treatment. Nasal washings and aspirates are unacceptable for Xpert Xpress SARS-CoV-2/FLU/RSV testing.  Fact Sheet for Patients: EntrepreneurPulse.com.au  Fact Sheet for Healthcare Providers: IncredibleEmployment.be  This test is not yet approved or cleared by the [place] FDA and has been authorized for detection and/or diagnosis of SARS-CoV-2 by FDA under an Emergency Use Authorization (EUA). This EUA will remain in effect (meaning this test can be used) for the duration of the  COVID-19 declaration under Section 564(b)(1) of the Act, 21 U.S.C. section 360bbb-3(b)(1), unless the authorization is terminated  or revoked.  Performed at [facility], [address] [place], [place] [address]     Procedures and diagnostic studies:  DG Chest [facility] 1 View  Result Date: 07/19/2020 CLINICAL DATA:  78 year old female with shortness of breath. EXAM: PORTABLE CHEST 1 VIEW COMPARISON:  Chest radiograph dated 11/15/2017. FINDINGS: There is cardiomegaly with vascular congestion and edema. Small bilateral pleural effusions, right greater left, with bibasilar atelectasis. Pneumonia is not excluded clinical correlation is recommended. No pneumothorax. Atherosclerotic calcification of the aorta. No acute osseous pathology. IMPRESSION: Cardiomegaly with findings of CHF and small bilateral pleural effusions. Pneumonia is not excluded. Electronically Signed   By: Anner Crete M.D.   On: 07/19/2020 15:32   ECHOCARDIOGRAM COMPLETE  Result Date: 07/20/2020    ECHOCARDIOGRAM REPORT   Patient Name:   Dana Perez Date of Exam: 07/20/2020 Medical Rec #:  [id number]         Height:       65.0 in Accession #:    [id number]        Weight:       138.0 lb Date of Birth:  1941/07/17         BSA:          1.690 m Patient Age:    49 years          BP:           157/76 mmHg Patient Gender: F                 HR:           96 bpm. Exam Location:  [facility] Procedure: 2D Echo, Cardiac Doppler and Color Doppler Indications:     CHF-acute diastolic 572.62  History:         Patient has no prior history of Echocardiogram examinations.                  Risk Factors:Dyslipidemia.  Sonographer:     Sherrie Sport RDCS (AE) Referring Phys:  [id number] Royce Macadamia AGBATA Diagnosing Phys: Yolonda Kida MD  Sonographer Comments: Technically challenging study due to limited acoustic windows, no apical window and no subcostal window. IMPRESSIONS  1. Left ventricular ejection fraction, by estimation, is 65 to 70%. The left ventricle has normal function. The left ventricle has no regional wall motion abnormalities. There is mild left ventricular  hypertrophy. Left ventricular diastolic parameters are consistent with Grade I diastolic dysfunction (impaired relaxation).  2. Right ventricular systolic function is mildly reduced. The right ventricular size is moderately enlarged.  3. Moderate pericardial effusion.  4. The mitral valve is grossly normal. Mild mitral valve regurgitation.  5. The aortic valve is normal in structure. Aortic valve regurgitation is trivial. FINDINGS  Left Ventricle: Left ventricular ejection fraction, by estimation, is 65 to 70%. The left ventricle has normal function. The left ventricle has no regional wall motion abnormalities. The left ventricular internal cavity size was normal in size. There is  mild left ventricular hypertrophy. Left ventricular diastolic parameters are consistent with Grade I diastolic dysfunction (impaired relaxation). Right Ventricle: The right ventricular size is moderately enlarged. No increase in right ventricular wall thickness. Right ventricular systolic function is mildly reduced. Left Atrium: Left atrial size was normal in size. Right Atrium: Right atrial size was normal in size. Pericardium: A moderately sized pericardial effusion is present. Mitral Valve: The mitral valve is  grossly normal. Mild mitral valve regurgitation. Tricuspid Valve: The tricuspid valve is grossly normal. Tricuspid valve regurgitation is mild. Aortic Valve: The aortic valve is normal in structure. Aortic valve regurgitation is trivial. Pulmonic Valve: The pulmonic valve was grossly normal. Pulmonic valve regurgitation is not visualized. Aorta: The aortic arch was not well visualized. IAS/Shunts: No atrial level shunt detected by color flow Doppler.  LEFT VENTRICLE PLAX 2D LVIDd:         3.82 cm LVIDs:         2.48 cm LV PW:         0.92 cm LV IVS:        1.11 cm LVOT diam:     2.00 cm LVOT Area:     3.14 cm  LEFT ATRIUM         Index LA diam:    2.30 cm 1.36 cm/m                        PULMONIC VALVE AORTA                 PV  Vmax:        0.67 m/s Ao Root diam: 2.30 cm PV Peak grad:   1.8 mmHg                       RVOT Peak grad: 2 mmHg  TRICUSPID VALVE TR Peak grad:   23.6 mmHg TR Vmax:        243.00 cm/s  SHUNTS Systemic Diam: 2.00 cm Dwayne Prince Rome MD Electronically signed by Yolonda Kida MD Signature Date/Time: 07/20/2020/3:01:26 PM    Final                LOS: 1 day   Cobin Cadavid  [facility]   Pager on [email or web address]. If 7PM-7AM, please contact night-coverage at [email or web address]     07/20/2020, 4:54 PM

## 2020-07-20 NOTE — ED Notes (Signed)
Patient incontinent of urine. Peri care provided and gown changed. New brief placed. All bed linens changed as well. 2 warm blankets provided upon completion. Patient positioned in bed for comfort. Bed low and locked with sider ails raised x2 and call bell in reach. Cardiac monitor in place.

## 2020-07-21 ENCOUNTER — Encounter: Payer: Self-pay | Admitting: Internal Medicine

## 2020-07-21 DIAGNOSIS — C760 Malignant neoplasm of head, face and neck: Secondary | ICD-10-CM | POA: Diagnosis not present

## 2020-07-21 DIAGNOSIS — I5031 Acute diastolic (congestive) heart failure: Secondary | ICD-10-CM | POA: Diagnosis not present

## 2020-07-21 DIAGNOSIS — J9601 Acute respiratory failure with hypoxia: Secondary | ICD-10-CM | POA: Diagnosis not present

## 2020-07-21 LAB — BASIC METABOLIC PANEL
Anion gap: 12 (ref 5–15)
BUN: 41 mg/dL — ABNORMAL HIGH (ref 8–23)
CO2: 26 mmol/L (ref 22–32)
Calcium: 8.4 mg/dL — ABNORMAL LOW (ref 8.9–10.3)
Chloride: 103 mmol/L (ref 98–111)
Creatinine, Ser: 1.08 mg/dL — ABNORMAL HIGH (ref 0.44–1.00)
GFR, Estimated: 53 mL/min — ABNORMAL LOW (ref >60)
Glucose, Bld: 92 mg/dL (ref 70–99)
Potassium: 3.5 mmol/L (ref 3.5–5.1)
Sodium: 141 mmol/L (ref 135–145)

## 2020-07-21 LAB — GLUCOSE, CAPILLARY
Glucose-Capillary: 97 mg/dL (ref 70–99)
Glucose-Capillary: 95 mg/dL (ref 70–99)

## 2020-07-21 LAB — MAGNESIUM: Magnesium: 2.6 mg/dL — ABNORMAL HIGH (ref 1.7–2.4)

## 2020-07-21 MED ORDER — CHOLESTYRAMINE 4 G PO PACK
4.0000 g | PACK | Freq: Three times a day (TID) | ORAL | Status: DC
  Administered 2020-07-21 – 2020-07-27 (×15): 4 g
  Filled 2020-07-21 (×19): qty 1

## 2020-07-21 NOTE — Progress Notes (Signed)
[facility] Cardiology    SUBJECTIVE: 52 be doing better still has some dyspnea shortness of breath still slightly hypoxic receiving respiratory support supplemental oxygen and inhalers progress patient to get out of bed and ambulate has best with physical therapy continue breathing exercises with incentive spirometer denies any chest pain   Vitals:   07/21/20 0200 07/21/20 0320 07/21/20 0400 07/21/20 0739  BP: 138/84  (!) 150/76 (!) 173/88  Pulse: 85  85 87  Resp: (!) 22   (!) 22  Temp:   97.8 F (36.6 C) 98 F (36.7 C)  TempSrc:   Axillary Axillary  SpO2: (!) 88%  93% 94%  Weight:  62.6 kg    Height:         Intake/Output Summary (Last 24 hours) at 07/21/2020 1004 Last data filed at 07/21/2020 7902 Gross per 24 hour  Intake 200 ml  Output 1210 ml  Net -1010 ml      PHYSICAL EXAM  General: Well developed, well nourished, in no acute distress HEENT:  Normocephalic and atramatic Neck:  No JVD.  Lungs: Clear bilaterally to auscultation and percussion. Heart: HRRR . Normal S1 and S2 without gallops or murmurs.  Abdomen: Bowel sounds are positive, abdomen soft and non-tender  Msk:  Back normal, normal gait. Normal strength and tone for age. Extremities: No clubbing, cyanosis or edema.   Neuro: Alert and oriented X 3. Psych:  Good affect, responds appropriately   LABS: Basic Metabolic Panel: Recent Labs    07/20/20 0447 07/21/20 0619  NA 146* 141  K 3.6 3.5  CL 105 103  CO2 28 26  GLUCOSE 94 92  BUN 42* 41*  CREATININE 1.24* 1.08*  CALCIUM 8.2* 8.4*  MG 2.7* 2.6*   Liver Function Tests: Recent Labs    07/19/20 0944  AST 31  ALT 19  ALKPHOS 115  BILITOT 0.5  PROT 7.3  ALBUMIN 3.6   No results for input(s): LIPASE, AMYLASE in the last 72 hours. CBC: Recent Labs    07/19/20 0944 07/19/20 1454  WBC 9.8 8.9  NEUTROABS 8.3*  --   HGB 15.6* 15.3*  HCT 50.0* 48.9*  MCV 95.6 95.7  PLT 385 371   Cardiac Enzymes: No results for input(s): CKTOTAL, CKMB,  CKMBINDEX, TROPONINI in the last 72 hours. BNP: Invalid input(s): POCBNP D-Dimer: No results for input(s): DDIMER in the last 72 hours. Hemoglobin A1C: No results for input(s): HGBA1C in the last 72 hours. Fasting Lipid Panel: No results for input(s): CHOL, HDL, LDLCALC, TRIG, CHOLHDL, LDLDIRECT in the last 72 hours. Thyroid Function Tests: Recent Labs    07/19/20 1223  TSH 1.000  T4TOTAL 9.8   Anemia Panel: No results for input(s): VITAMINB12, FOLATE, FERRITIN, TIBC, IRON, RETICCTPCT in the last 72 hours.  DG Chest Port 1 View  Result Date: 07/19/2020 CLINICAL DATA:  79 year old female with shortness of breath. EXAM: PORTABLE CHEST 1 VIEW COMPARISON:  Chest radiograph dated 11/15/2017. FINDINGS: There is cardiomegaly with vascular congestion and edema. Small bilateral pleural effusions, right greater left, with bibasilar atelectasis. Pneumonia is not excluded clinical correlation is recommended. No pneumothorax. Atherosclerotic calcification of the aorta. No acute osseous pathology. IMPRESSION: Cardiomegaly with findings of CHF and small bilateral pleural effusions. Pneumonia is not excluded. Electronically Signed   By: Anner Crete M.D.   On: 07/19/2020 15:32   ECHOCARDIOGRAM COMPLETE  Result Date: 07/20/2020    ECHOCARDIOGRAM REPORT   Patient Name:   Dana Perez Date of Exam: 07/20/2020 Medical Rec #:  [id number]         Height:       65.0 in Accession #:    [id number]        Weight:       138.0 lb Date of Birth:  [birth year]         BSA:          1.690 m Patient Age:    79 years          BP:           157/76 mmHg Patient Gender: F                 HR:           96 bpm. Exam Location:  [facility] Procedure: 2D Echo, Cardiac Doppler and Color Doppler Indications:     CHF-acute diastolic 371.06  History:         Patient has no prior history of Echocardiogram examinations.                  Risk Factors:Dyslipidemia.  Sonographer:     Sherrie Sport RDCS (AE) Referring Phys:  [id number] Royce Macadamia  AGBATA Diagnosing Phys: Yolonda Kida MD  Sonographer Comments: Technically challenging study due to limited acoustic windows, no apical window and no subcostal window. IMPRESSIONS  1. Left ventricular ejection fraction, by estimation, is 65 to 70%. The left ventricle has normal function. The left ventricle has no regional wall motion abnormalities. There is mild left ventricular hypertrophy. Left ventricular diastolic parameters are consistent with Grade I diastolic dysfunction (impaired relaxation).  2. Right ventricular systolic function is mildly reduced. The right ventricular size is moderately enlarged.  3. Moderate pericardial effusion.  4. The mitral valve is grossly normal. Mild mitral valve regurgitation.  5. The aortic valve is normal in structure. Aortic valve regurgitation is trivial. FINDINGS  Left Ventricle: Left ventricular ejection fraction, by estimation, is 65 to 70%. The left ventricle has normal function. The left ventricle has no regional wall motion abnormalities. The left ventricular internal cavity size was normal in size. There is  mild left ventricular hypertrophy. Left ventricular diastolic parameters are consistent with Grade I diastolic dysfunction (impaired relaxation). Right Ventricle: The right ventricular size is moderately enlarged. No increase in right ventricular wall thickness. Right ventricular systolic function is mildly reduced. Left Atrium: Left atrial size was normal in size. Right Atrium: Right atrial size was normal in size. Pericardium: A moderately sized pericardial effusion is present. Mitral Valve: The mitral valve is grossly normal. Mild mitral valve regurgitation. Tricuspid Valve: The tricuspid valve is grossly normal. Tricuspid valve regurgitation is mild. Aortic Valve: The aortic valve is normal in structure. Aortic valve regurgitation is trivial. Pulmonic Valve: The pulmonic valve was grossly normal. Pulmonic valve regurgitation is not visualized. Aorta: The  aortic arch was not well visualized. IAS/Shunts: No atrial level shunt detected by color flow Doppler.  LEFT VENTRICLE PLAX 2D LVIDd:         3.82 cm LVIDs:         2.48 cm LV PW:         0.92 cm LV IVS:        1.11 cm LVOT diam:     2.00 cm LVOT Area:     3.14 cm  LEFT ATRIUM         Index LA diam:    2.30 cm 1.36 cm/m  PULMONIC VALVE AORTA                 PV Vmax:        0.67 m/s Ao Root diam: 2.30 cm PV Peak grad:   1.8 mmHg                       RVOT Peak grad: 2 mmHg  TRICUSPID VALVE TR Peak grad:   23.6 mmHg TR Vmax:        243.00 cm/s  SHUNTS Systemic Diam: 2.00 cm Susane Bey D Jhanvi Drakeford MD Electronically signed by Yolonda Kida MD Signature Date/Time: 07/20/2020/3:01:26 PM    Final      Echo ejection fraction of around 60% preserved  TELEMETRY: Normal sinus rhythm 70:  ASSESSMENT AND PLAN:  Principal Problem:   Acute respiratory failure (HCC) Active Problems:   Essential hypertension   Squamous cell carcinoma of head and neck (HCC)   CKD (chronic kidney disease) stage 3, GFR 30-59 ml/min   Acute CHF (congestive heart failure) (HCC)   COPD (chronic obstructive pulmonary disease) (HCC)   Acute lower UTI    Plan Improving respiratory status slowly but still short of breath Continue diuretic therapy for heart failure Agree with nephrology input and assistance for renal insufficiency Supplemental oxygen inhalers as necessary for COPD type symptoms Recommend continued oncology input for squamous cell cancer of the head neck area No invasive procedures planned at this point Continue blood pressure control Increase activity with assistance as patient is able to Have patient follow-up with cardiology upon discharge   Yolonda Kida, MD 07/21/2020 10:04 AM

## 2020-07-21 NOTE — Progress Notes (Signed)
[facility] [place] [facility]    SUBJECTIVE: Patient denies dyspnea shortness of breath denies any pain resting comfortably in the bed has not ambulated since being here still hypoxic no fever chills or sweats   Vitals:   07/21/20 0200 07/21/20 0320 07/21/20 0400 07/21/20 0739  BP: 138/84  (!) 150/76 (!) 173/88  Pulse: 85  85 87  Resp: (!) 22   (!) 22  Temp:   97.8 F (36.6 C) 98 F (36.7 C)  TempSrc:   Axillary Axillary  SpO2: (!) 88%  93% 94%  Weight:  62.6 kg    Height:         Intake/Output Summary (Last 24 hours) at 07/21/2020 1215 Last data filed at 07/21/2020 1610 Gross per 24 hour  Intake 200 ml  Output 260 ml  Net -60 ml      PHYSICAL EXAM  General: Well developed, well nourished, in no acute distress HEENT:  Normocephalic and atramatic Neck:  No JVD.  Lungs: Clear bilaterally to auscultation and percussion. Heart: HRRR . Normal S1 and S2 without gallops or murmurs.  Abdomen: Bowel sounds are positive, abdomen soft and non-tender  Msk:  Back normal, normal gait. Normal strength and tone for age. Extremities: No clubbing, cyanosis or edema.   Neuro: Alert and oriented X 3. Psych:  Good affect, responds appropriately   LABS: Basic Metabolic Panel: Recent Labs    07/20/20 0447 07/21/20 0619  NA 146* 141  K 3.6 3.5  CL 105 103  CO2 28 26  GLUCOSE 94 92  BUN 42* 41*  CREATININE 1.24* 1.08*  CALCIUM 8.2* 8.4*  MG 2.7* 2.6*   Liver Function Tests: Recent Labs    07/19/20 0944  AST 31  ALT 19  ALKPHOS 115  BILITOT 0.5  PROT 7.3  ALBUMIN 3.6   No results for input(s): LIPASE, AMYLASE in the last 72 hours. CBC: Recent Labs    07/19/20 0944 07/19/20 1454  WBC 9.8 8.9  NEUTROABS 8.3*  --   HGB 15.6* 15.3*  HCT 50.0* 48.9*  MCV 95.6 95.7  PLT 385 371   Cardiac Enzymes: No results for input(s): CKTOTAL, CKMB, CKMBINDEX, TROPONINI in the last 72 hours. BNP: Invalid input(s): POCBNP D-Dimer: No results for input(s): DDIMER in the last 72  hours. Hemoglobin A1C: No results for input(s): HGBA1C in the last 72 hours. Fasting Lipid Panel: No results for input(s): CHOL, HDL, LDLCALC, TRIG, CHOLHDL, LDLDIRECT in the last 72 hours. Thyroid Function Tests: Recent Labs    07/19/20 1223  TSH 1.000  T4TOTAL 9.8   Anemia Panel: No results for input(s): VITAMINB12, FOLATE, FERRITIN, TIBC, IRON, RETICCTPCT in the last 72 hours.  DG Chest Port 1 View  Result Date: 07/19/2020 CLINICAL DATA:  79 year old female with shortness of breath. EXAM: PORTABLE CHEST 1 VIEW COMPARISON:  Chest radiograph dated 11/15/2017. FINDINGS: There is cardiomegaly with vascular congestion and edema. Small bilateral pleural effusions, right greater left, with bibasilar atelectasis. Pneumonia is not excluded clinical correlation is recommended. No pneumothorax. Atherosclerotic calcification of the aorta. No acute osseous pathology. IMPRESSION: Cardiomegaly with findings of CHF and small bilateral pleural effusions. Pneumonia is not excluded. Electronically Signed   By: Anner Crete M.D.   On: 07/19/2020 15:32   ECHOCARDIOGRAM COMPLETE  Result Date: 07/20/2020    ECHOCARDIOGRAM REPORT   Patient Name:   Dana Perez Date of Exam: 07/20/2020 Medical Rec #:  [id number]         Height:       65.0  in Accession #:    [id number]        Weight:       138.0 lb Date of Birth:  1941-07-01         BSA:          1.690 m Patient Age:    79 years          BP:           157/76 mmHg Patient Gender: F                 HR:           96 bpm. Exam Location:  [facility] Procedure: 2D Echo, Cardiac Doppler and Color Doppler Indications:     CHF-acute diastolic 332.95  History:         Patient has no prior history of Echocardiogram examinations.                  Risk Factors:Dyslipidemia.  Sonographer:     Sherrie Sport RDCS (AE) Referring Phys:  [id number] Royce Macadamia AGBATA Diagnosing Phys: Yolonda Kida MD  Sonographer Comments: Technically challenging study due to limited acoustic windows, no  apical window and no subcostal window. IMPRESSIONS  1. Left ventricular ejection fraction, by estimation, is 65 to 70%. The left ventricle has normal function. The left ventricle has no regional wall motion abnormalities. There is mild left ventricular hypertrophy. Left ventricular diastolic parameters are consistent with Grade I diastolic dysfunction (impaired relaxation).  2. Right ventricular systolic function is mildly reduced. The right ventricular size is moderately enlarged.  3. Moderate pericardial effusion.  4. The mitral valve is grossly normal. Mild mitral valve regurgitation.  5. The aortic valve is normal in structure. Aortic valve regurgitation is trivial. FINDINGS  Left Ventricle: Left ventricular ejection fraction, by estimation, is 65 to 70%. The left ventricle has normal function. The left ventricle has no regional wall motion abnormalities. The left ventricular internal cavity size was normal in size. There is  mild left ventricular hypertrophy. Left ventricular diastolic parameters are consistent with Grade I diastolic dysfunction (impaired relaxation). Right Ventricle: The right ventricular size is moderately enlarged. No increase in right ventricular wall thickness. Right ventricular systolic function is mildly reduced. Left Atrium: Left atrial size was normal in size. Right Atrium: Right atrial size was normal in size. Pericardium: A moderately sized pericardial effusion is present. Mitral Valve: The mitral valve is grossly normal. Mild mitral valve regurgitation. Tricuspid Valve: The tricuspid valve is grossly normal. Tricuspid valve regurgitation is mild. Aortic Valve: The aortic valve is normal in structure. Aortic valve regurgitation is trivial. Pulmonic Valve: The pulmonic valve was grossly normal. Pulmonic valve regurgitation is not visualized. Aorta: The aortic arch was not well visualized. IAS/Shunts: No atrial level shunt detected by color flow Doppler.  LEFT VENTRICLE PLAX 2D LVIDd:          3.82 cm LVIDs:         2.48 cm LV PW:         0.92 cm LV IVS:        1.11 cm LVOT diam:     2.00 cm LVOT Area:     3.14 cm  LEFT ATRIUM         Index LA diam:    2.30 cm 1.36 cm/m                        PULMONIC VALVE AORTA  PV Vmax:        0.67 m/s Ao Root diam: 2.30 cm PV Peak grad:   1.8 mmHg                       RVOT Peak grad: 2 mmHg  TRICUSPID VALVE TR Peak grad:   23.6 mmHg TR Vmax:        243.00 cm/s  SHUNTS Systemic Diam: 2.00 cm Quintavia Rogstad D Zalyn Amend MD Electronically signed by Yolonda Kida MD Signature Date/Time: 07/20/2020/3:01:26 PM    Final      Echo preserved overall left ventricular function EF of 60%  TELEMETRY normal sinus rhythm rate of 75  ASSESSMENT AND PLAN:  Principal Problem:   Acute respiratory failure (HCC) Active Problems:   Essential hypertension   Squamous cell carcinoma of head and neck (HCC)   CKD (chronic kidney disease) stage 3, GFR 30-59 ml/min   Acute CHF (congestive heart failure) (HCC)   COPD (chronic obstructive pulmonary disease) (HCC)   Acute lower UTI    Plan Continue diuretic therapy for congestive heart failure Follow-up with nephrology for renal insufficiency Continue therapy for hypertension Inhalers supplemental oxygen as necessary for COPD Oncology input for squamous cell carcinoma Recommend conservative cardiac input at this point   Yolonda Kida, MD 07/21/2020 12:15 PM

## 2020-07-21 NOTE — Progress Notes (Signed)
Fentanyl patch from home removed from left arm and discarded.  New patch applied to right upper arm.

## 2020-07-21 NOTE — Plan of Care (Signed)
  Problem: Clinical Measurements: Goal: Will remain free from infection Outcome: Progressing Goal: Respiratory complications will improve Outcome: Progressing   Problem: Safety: Goal: Ability to remain free from injury will improve Outcome: Progressing   

## 2020-07-21 NOTE — Progress Notes (Signed)
Hematology/Oncology Progress Note [facility] Telephone:([phone] [phone]  Patient Care Team: [name], MD as PCP - General (Physician Assistant) [name], MD as Referring Physician (Surgery) [name], MD as Consulting Physician (Oncology)   Name of the patient: Dana Perez  [id number]  1941/02/01  Date of visit: 07/21/20   INTERVAL HISTORY-   Husband at the bedside. Patient reports feeling better. Mental status has returned to close to baseline. Shortness has slightly improved. Patient was seen by cardiology.   Review of systems- Review of Systems  Constitutional: Positive for fatigue.  Respiratory: Positive for cough and shortness of breath.   Gastrointestinal: Negative for abdominal pain.  Genitourinary: Negative for dysuria.   Musculoskeletal: Negative for back pain.  Neurological: Negative for headaches.  Psychiatric/Behavioral: Negative for confusion.    Allergies  Allergen Reactions  . Buprenorphine Nausea Only and Nausea And Vomiting  . Other Other (See Comments) and Rash    TDAP tdap TDAP  . Tetanus Antitoxin Rash    Patient Active Problem List   Diagnosis Date Noted  . Acute respiratory failure ([place]) 07/19/2020  . Acute CHF (congestive heart failure) ([place]) 07/19/2020  . COPD (chronic obstructive pulmonary disease) ([place]) 07/19/2020  . Acute lower UTI 07/19/2020  . Chronic obstructive pulmonary disease ([place]) 04/30/2020  . Gastrostomy tube dependent ([place]) 04/30/2020  . Chronic face pain (Right) 04/22/2020  . Right-sided face pain 04/22/2020  . Atypical face pain 04/22/2020  . Maxillary pain (Right) 04/22/2020  . Mandibular pain (Right) 04/22/2020  . Mouth pain 04/22/2020  . Chronic pain syndrome 03/29/2020  . Pharmacologic therapy 03/29/2020  . Disorder of skeletal system 03/29/2020  . Problems influencing health status 03/29/2020  . Protein-calorie malnutrition, severe  05/18/2019  . Skin lesion 05/18/2018  . Cancer associated pain 12/31/2017  . Chronic pain due to neoplasm 12/31/2017  . Oral candidiasis 11/15/2017  . Trimalleolar fracture of ankle, closed, left, with routine healing, subsequent encounter 09/27/2017  . Malnutrition of moderate degree 08/10/2017  . Closed left ankle fracture 08/07/2017  . Goals of care, counseling/discussion 07/14/2017  . Adenocarcinoma of colon ([place])   . Squamous cell carcinoma of head and neck ([place]) 07/01/2017  . CKD (chronic kidney disease) stage 3, GFR 30-59 ml/min 04/26/2017  . Erythrocytosis 07/17/2015  . Pure hypercholesterolemia 03/15/2015  . Polycythemia, secondary 12/26/2014  . Thyroid nodule 12/14/2014  . HLD (hyperlipidemia) 06/13/2014  . Hyperlipidemia, unspecified 06/13/2014  . Essential hypertension 06/13/2014  . Multiple thyroid nodules 01/22/2012     Past Medical History:  Diagnosis Date  . Acute hypoxemic respiratory failure ([place]) 11/15/2017  . Acute respiratory failure ([place]) 11/15/2017  . Adenocarcinoma of colon ([place])   . Benign neoplasm of ascending colon   . CAP (community acquired pneumonia) 11/15/2017  . Essential hypertension 06/13/2014  . Goals of care, counseling/discussion 07/14/2017  . Healthcare-associated pneumonia   . HLD (hyperlipidemia) 06/13/2014  . Hypercholesteremia   . Hyperlipidemia   . Melena   . Multiple thyroid nodules 01/22/2012  . Osteoporosis   . Polycythemia, secondary 12/26/2014  . Pure hypercholesterolemia 03/15/2015  . Squamous cell carcinoma of head and neck ([place])   . Thyroid nodule      Past Surgical History:  Procedure Laterality Date  . ABDOMINAL HYSTERECTOMY    . COLONOSCOPY WITH PROPOFOL N/A 07/02/2017   Procedure: COLONOSCOPY WITH PROPOFOL;  Surgeon: Lucilla Lame, MD;  Location: [place];  Service: Endoscopy;  Laterality: N/A;  . ESOPHAGOGASTRODUODENOSCOPY N/A 05/17/2019  Procedure: ESOPHAGOGASTRODUODENOSCOPY (EGD);  Surgeon: Lin Landsman, MD;  Location: [facility] ENDOSCOPY;  Service: Gastroenterology;  Laterality: N/A;  . ORIF ANKLE FRACTURE Left 08/08/2017   Procedure: OPEN REDUCTION INTERNAL FIXATION (ORIF) ANKLE FRACTURE;  Surgeon: Dereck Leep, MD;  Location: [facility];  Service: Orthopedics;  Laterality: Left;  . POLYPECTOMY N/A 07/02/2017   Procedure: POLYPECTOMY;  Surgeon: Lucilla Lame, MD;  Location: [place];  Service: Endoscopy;  Laterality: N/A;  . WRIST FRACTURE SURGERY  08/2009   badly broken    Social History   Socioeconomic History  . Marital status: Married    Spouse name: Not on file  . Number of children: Not on file  . Years of education: Not on file  . Highest education level: Not on file  Occupational History  . Not on file  Tobacco Use  . Smoking status: Former Smoker    Packs/day: 0.25    Types: Cigarettes    Quit date: 06/17/2017    Years since quitting: 3.0  . Smokeless tobacco: Never Used  Vaping Use  . Vaping Use: Never used  Substance and Sexual Activity  . Alcohol use: No  . Drug use: No  . Sexual activity: Not Currently  Other Topics Concern  . Not on file  Social History Narrative   Pt lives at home with husband   Social Determinants of Health   Financial Resource Strain:   . Difficulty of Paying Living Expenses: Not on file  Food Insecurity:   . Worried About Charity fundraiser in the Last Year: Not on file  . Ran Out of Food in the Last Year: Not on file  Transportation Needs:   . Lack of Transportation (Medical): Not on file  . Lack of Transportation (Non-Medical): Not on file  Physical Activity:   . Days of Exercise per Week: Not on file  . Minutes of Exercise per Session: Not on file  Stress:   . Feeling of Stress : Not on file  Social Connections:   . Frequency of Communication with Friends and Family: Not on file  . Frequency of Social Gatherings with Friends and Family: Not on file  . Attends Religious Services: Not on file  . Active Member  of Clubs or Organizations: Not on file  . Attends Archivist Meetings: Not on file  . Marital Status: Not on file  Intimate Partner Violence:   . Fear of Current or Ex-Partner: Not on file  . Emotionally Abused: Not on file  . Physically Abused: Not on file  . Sexually Abused: Not on file     Family History  Problem Relation Age of Onset  . Heart disease Mother   . Diabetes Father      Current Facility-Administered Medications:  .  0.9 %  sodium chloride infusion, 250 mL, Intravenous, PRN, Agbata, Tochukwu, MD, Last Rate: 10 mL/hr at 07/20/20 2037, 250 mL at 07/20/20 2037 .  acetaminophen (TYLENOL) tablet 650 mg, 650 mg, Oral, Q6H PRN, Agbata, Tochukwu, MD .  bacitracin ointment 1 application, 1 application, Topical, Tonye Pearson, MD, 1 application at 16/96/78 1740 .  cefTRIAXone (ROCEPHIN) 1 g in sodium chloride 0.9 % 100 mL IVPB, 1 g, Intravenous, Q24H, Agbata, Tochukwu, MD, Stopped at 07/20/20 2110 .  chlorpheniramine-HYDROcodone (TUSSIONEX) 10-8 MG/5ML suspension 5 mL, 5 mL, Per Tube, Q12H PRN, Agbata, Tochukwu, MD .  cholestyramine (QUESTRAN) packet 4 g, 4 g, Per Tube, TID WC, Jennye Boroughs, MD .  clotrimazole (LOTRIMIN)  1 % cream, , Topical, Tonye Pearson, MD, Given at 07/20/20 1714 .  enoxaparin (LOVENOX) injection 40 mg, 40 mg, Subcutaneous, Q24H, Agbata, Tochukwu, MD, 40 mg at 07/20/20 2205 .  feeding supplement (ENSURE ENLIVE / ENSURE PLUS) liquid 237 mL, 237 mL, Per Tube, 5 X Daily, Jennye Boroughs, MD .  feeding supplement (PROSource TF) liquid 45 mL, 45 mL, Per Tube, BID, Jennye Boroughs, MD, 45 mL at 07/21/20 1015 .  fentaNYL (DURAGESIC) 50 MCG/HR 1 patch, 1 patch, Transdermal, Q72H, Jennye Boroughs, MD, 1 patch at 07/21/20 1114 .  free water 100 mL, 100 mL, Per Tube, 5 X Daily, Jennye Boroughs, MD, 100 mL at 07/21/20 1242 .  furosemide (LASIX) injection 40 mg, 40 mg, Intravenous, Daily, Agbata, Tochukwu, MD, 40 mg at 07/21/20 1015 .  hydrOXYzine  (ATARAX/VISTARIL) tablet 10 mg, 10 mg, Per Tube, BID PRN, Agbata, Tochukwu, MD .  mirtazapine (REMERON) tablet 15 mg, 15 mg, Oral, QHS, Agbata, Tochukwu, MD, 15 mg at 07/20/20 2211 .  morphine CONCENTRATE 10 MG/0.5ML oral solution 5 mg, 5 mg, Per Tube, Q4H PRN, Agbata, Tochukwu, MD .  ondansetron (ZOFRAN) injection 4 mg, 4 mg, Intravenous, Q6H PRN, Agbata, Tochukwu, MD, 4 mg at 07/21/20 0548 .  pravastatin (PRAVACHOL) tablet 10 mg, 10 mg, Per Tube, QHS, Agbata, Tochukwu, MD, 10 mg at 07/20/20 2212 .  sodium chloride flush (NS) 0.9 % injection 3 mL, 3 mL, Intravenous, Q12H, Agbata, Tochukwu, MD, 3 mL at 07/21/20 1015 .  sodium chloride flush (NS) 0.9 % injection 3 mL, 3 mL, Intravenous, PRN, Agbata, Tochukwu, MD  Facility-Administered Medications Ordered in Other Encounters:  .  heparin lock flush 100 unit/mL, 250 Units, Intracatheter, PRN, Ma Hillock, Sandeep, MD .  heparin lock flush 100 unit/mL, 500 Units, Intracatheter, PRN, Ma Hillock, Sandeep, MD .  potassium chloride 20 mEq in sodium chloride 0.9 % 500 mL injection, , Intravenous, Once, Saunders, Doni H, RN .  sodium chloride 0.9 % injection 10 mL, 10 mL, Intracatheter, PRN, Ma Hillock, Sandeep, MD .  sodium chloride 0.9 % injection 10 mL, 10 mL, Intracatheter, PRN, Ma Hillock, Sandeep, MD .  sodium chloride 0.9 % injection 10 mL, 10 mL, Intracatheter, PRN, Pandit, Sandeep, MD .  sodium chloride 0.9 % injection 10 mL, 10 mL, Intracatheter, PRN, Leia Alf, MD   Physical exam:  Vitals:   07/21/20 0739 07/21/20 1230 07/21/20 1359 07/21/20 1632  BP: (!) 173/88 (!) 151/80  (!) 143/72  Pulse: 87 88  92  Resp: (!) 22 16  16   Temp: 98 F (36.7 C) 98.1 F (36.7 C)  98.4 F (36.9 C)  TempSrc: Axillary Oral  Axillary  SpO2: 94% 94% 91% 93%  Weight:      Height:       Physical Exam HENT:     Head: Normocephalic and atraumatic.     Mouth/Throat:     Comments: No thrush Cardiovascular:     Rate and Rhythm: Normal rate.  Pulmonary:     Effort:  Pulmonary effort is normal. No respiratory distress.     Comments: Breathing comfortably via nasal cannula oxygen Abdominal:     General: There is no distension.     Palpations: Abdomen is soft.     Comments: PEG tube  Musculoskeletal:        General: No swelling. Normal range of motion.  Skin:    General: Skin is warm and dry.     Coloration: Skin is not jaundiced.  Neurological:     Mental Status:  She is alert. Mental status is at baseline.        CMP Latest Ref Rng & Units 07/21/2020  Glucose 70 - 99 mg/dL 92  BUN 8 - 23 mg/dL 41(H)  Creatinine 0.44 - 1.00 mg/dL 1.08(H)  Sodium 135 - 145 mmol/L 141  Potassium 3.5 - 5.1 mmol/L 3.5  Chloride 98 - 111 mmol/L 103  CO2 22 - 32 mmol/L 26  Calcium 8.9 - 10.3 mg/dL 8.4(L)  Total Protein 6.5 - 8.1 g/dL -  Total Bilirubin 0.3 - 1.2 mg/dL -  Alkaline Phos 38 - 126 U/L -  AST 15 - 41 U/L -  ALT 0 - 44 U/L -   CBC Latest Ref Rng & Units 07/19/2020  WBC 4.0 - 10.5 K/uL 8.9  Hemoglobin 12.0 - 15.0 g/dL 15.3(H)  Hematocrit 36 - 46 % 48.9(H)  Platelets 150 - 400 K/uL 371    RADIOGRAPHIC STUDIES: I have personally reviewed the radiological images as listed and agreed with the findings in the report. CT SOFT TISSUE NECK W CONTRAST  Result Date: 06/26/2020 CLINICAL DATA:  Follow-up head and neck cancer. Squamous cell carcinoma. Surveillance imaging. EXAM: CT NECK WITH CONTRAST TECHNIQUE: Multidetector CT imaging of the neck was performed using the standard protocol following the bolus administration of intravenous contrast. Not utilized. Stenosis is probably in the range of 50% on both sides. Diminutive right jugular vein. CONTRAST:  165mL OMNIPAQUE IOHEXOL 300 MG/ML  SOLN COMPARISON:  12/07/2019.  06/07/2019.  05/08/2017. FINDINGS: Pharynx and larynx: No mucosal or submucosal lesion. Salivary glands: Left submandibular gland is diminutive but normal. Right submandibular gland surgically absent. Parotid glands are normal. Thyroid: Normal  Lymph nodes: No residual or recurrent lymphadenopathy. Previous right mandibular resection with regional flap reconstruction. Similar appearance of the soft tissues of the region without evidence of recurrent mass. Vascular: Bilateral carotid atherosclerotic disease at the bifurcations. CT angiographic technique was not utilized. Stenosis estimated at approximately 50% on both sides. Diminutive right jugular vein. Limited intracranial: Negative Visualized orbits: Negative Mastoids and visualized paranasal sinuses: Clear Skeleton: Ordinary cervical spondylosis. Right mandibular resection as noted above. No malignant bone findings. Upper chest: See results of chest CT. 1.5 cm density right upper lobe. Other: None IMPRESSION: 1. Previous right mandibular resection with regional flap reconstruction. Similar appearance of the soft tissues of the region without evidence of recurrent mass. No residual or recurrent lymphadenopathy. 2. Bilateral carotid atherosclerotic disease. Stenosis estimated at approximately 50% on both sides. CT angiographic technique was not utilized. 3. 1.5 cm density right upper lobe.  See results of chest CT. Electronically Signed   By: Nelson Chimes M.D.   On: 06/26/2020 11:20   CT CHEST ABDOMEN PELVIS W CONTRAST  Result Date: 06/26/2020 CLINICAL DATA:  Metastatic head and neck cancer, history of colon cancer EXAM: CT CHEST, ABDOMEN, AND PELVIS WITH CONTRAST TECHNIQUE: Multidetector CT imaging of the chest, abdomen and pelvis was performed following the standard protocol during bolus administration of intravenous contrast. CONTRAST:  168mL OMNIPAQUE IOHEXOL 300 MG/ML SOLN, additional oral enteric contrast COMPARISON:  12/07/2019 FINDINGS: CT CHEST FINDINGS Cardiovascular: Aortic atherosclerosis. Normal heart size. Left and right coronary artery calcifications. Small pericardial effusion, unchanged. Mediastinum/Nodes: No enlarged mediastinal, hilar, or axillary lymph nodes. Thyroid gland,  trachea, and esophagus demonstrate no significant findings. Lungs/Pleura: Severe centrilobular emphysema. There are multiple new bilateral pulmonary nodules, in the peripheral right apex measuring 1.5 x 1.4 cm (series 8, image 24), in the anterior right middle lobe measuring 1.8 x 1.2  cm (series 8, image 84) and in the superior segment left lower lobe measuring 2.2 x 1.7 cm (series 8, image 82). No pleural effusion or pneumothorax. Musculoskeletal: No chest wall mass or suspicious bone lesions identified. CT ABDOMEN PELVIS FINDINGS Hepatobiliary: No solid liver abnormality is seen. No gallstones, gallbladder wall thickening, or biliary dilatation. Pancreas: Unremarkable. No pancreatic ductal dilatation or surrounding inflammatory changes. Spleen: Normal in size without significant abnormality. Adrenals/Urinary Tract: Adrenal glands are unremarkable. Kidneys are normal, without renal calculi, solid lesion, or hydronephrosis. Bladder is unremarkable. Stomach/Bowel: Percutaneous gastrostomy. Stomach is otherwise within normal limits. Appendix is not clearly visualized. No evidence of bowel wall thickening, distention, or inflammatory changes. Vascular/Lymphatic: No significant vascular findings are present. No enlarged abdominal or pelvic lymph nodes. Reproductive: Status post hysterectomy. Other: No abdominal wall hernia or abnormality. No abdominopelvic ascites. Musculoskeletal: No acute or significant osseous findings. IMPRESSION: 1. There are multiple new bilateral pulmonary nodules, most consistent with new pulmonary metastatic disease although conceivably infectious or inflammatory in nature. Consider tissue sampling. 2. No evidence of metastatic disease within the abdomen or pelvis. 3. Emphysema (ICD10-J43.9). 4. Small, unchanged pericardial effusion. 5. Coronary artery disease.  Aortic Atherosclerosis (ICD10-I70.0). Electronically Signed   By: Eddie Candle M.D.   On: 06/26/2020 15:57   DG Chest Port 1  View  Result Date: 07/19/2020 CLINICAL DATA:  79 year old female with shortness of breath. EXAM: PORTABLE CHEST 1 VIEW COMPARISON:  Chest radiograph dated 11/15/2017. FINDINGS: There is cardiomegaly with vascular congestion and edema. Small bilateral pleural effusions, right greater left, with bibasilar atelectasis. Pneumonia is not excluded clinical correlation is recommended. No pneumothorax. Atherosclerotic calcification of the aorta. No acute osseous pathology. IMPRESSION: Cardiomegaly with findings of CHF and small bilateral pleural effusions. Pneumonia is not excluded. Electronically Signed   By: Anner Crete M.D.   On: 07/19/2020 15:32   ECHOCARDIOGRAM COMPLETE  Result Date: 07/20/2020    ECHOCARDIOGRAM REPORT   Patient Name:   LANNETTE AVELLINO Date of Exam: 07/20/2020 Medical Rec #:  [id number]         Height:       65.0 in Accession #:    [id number]        Weight:       138.0 lb Date of Birth:  1940-11-29         BSA:          1.690 m Patient Age:    79 years          BP:           157/76 mmHg Patient Gender: F                 HR:           96 bpm. Exam Location:  [facility] Procedure: 2D Echo, Cardiac Doppler and Color Doppler Indications:     CHF-acute diastolic 314.97  History:         Patient has no prior history of Echocardiogram examinations.                  Risk Factors:Dyslipidemia.  Sonographer:     Sherrie Sport RDCS (AE) Referring Phys:  [id number] Royce Macadamia AGBATA Diagnosing Phys: Yolonda Kida MD  Sonographer Comments: Technically challenging study due to limited acoustic windows, no apical window and no subcostal window. IMPRESSIONS  1. Left ventricular ejection fraction, by estimation, is 65 to 70%. The left ventricle has normal function. The left ventricle has no regional wall motion abnormalities. There is  mild left ventricular hypertrophy. Left ventricular diastolic parameters are consistent with Grade I diastolic dysfunction (impaired relaxation).  2. Right ventricular systolic function  is mildly reduced. The right ventricular size is moderately enlarged.  3. Moderate pericardial effusion.  4. The mitral valve is grossly normal. Mild mitral valve regurgitation.  5. The aortic valve is normal in structure. Aortic valve regurgitation is trivial. FINDINGS  Left Ventricle: Left ventricular ejection fraction, by estimation, is 65 to 70%. The left ventricle has normal function. The left ventricle has no regional wall motion abnormalities. The left ventricular internal cavity size was normal in size. There is  mild left ventricular hypertrophy. Left ventricular diastolic parameters are consistent with Grade I diastolic dysfunction (impaired relaxation). Right Ventricle: The right ventricular size is moderately enlarged. No increase in right ventricular wall thickness. Right ventricular systolic function is mildly reduced. Left Atrium: Left atrial size was normal in size. Right Atrium: Right atrial size was normal in size. Pericardium: A moderately sized pericardial effusion is present. Mitral Valve: The mitral valve is grossly normal. Mild mitral valve regurgitation. Tricuspid Valve: The tricuspid valve is grossly normal. Tricuspid valve regurgitation is mild. Aortic Valve: The aortic valve is normal in structure. Aortic valve regurgitation is trivial. Pulmonic Valve: The pulmonic valve was grossly normal. Pulmonic valve regurgitation is not visualized. Aorta: The aortic arch was not well visualized. IAS/Shunts: No atrial level shunt detected by color flow Doppler.  LEFT VENTRICLE PLAX 2D LVIDd:         3.82 cm LVIDs:         2.48 cm LV PW:         0.92 cm LV IVS:        1.11 cm LVOT diam:     2.00 cm LVOT Area:     3.14 cm  LEFT ATRIUM         Index LA diam:    2.30 cm 1.36 cm/m                        PULMONIC VALVE AORTA                 PV Vmax:        0.67 m/s Ao Root diam: 2.30 cm PV Peak grad:   1.8 mmHg                       RVOT Peak grad: 2 mmHg  TRICUSPID VALVE TR Peak grad:   23.6 mmHg TR  Vmax:        243.00 cm/s  SHUNTS Systemic Diam: 2.00 cm Dwayne Prince Rome MD Electronically signed by Yolonda Kida MD Signature Date/Time: 07/20/2020/3:01:26 PM    Final     Assessment and plan-  Acute hypoxia respiratory failure- likely due to CHF Elevated BNP and troponin. Continue diuretic therapy for congestive heart failure. Nasal cannula oxygen as needed.  Stage IV head and neck squamous lung cancer with CT evidence of new lung nodules, which likely due to cancer recurrence. She also has a history of colon cancer- malignant colon polyp and not surgical candidate due to poor performance status.  Follow-up outpatient with Dr. Cecille Aver for cancer treatment. Plan was discussed with patient and her husband.  Thank you for allowing me to participate in the care of this patient.   Earlie Server, MD, PhD Hematology Oncology [facility] Pager- [phone] 07/21/2020

## 2020-07-21 NOTE — Progress Notes (Signed)
Progress Note    Dana Perez  TIR:[id number] DOB: 1940-09-26  DOA: 07/19/2020 PCP: [name], MD      Brief Narrative:    Medical records reviewed and are as summarized below:  Dana Perez is a 79 y.o. 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  She had gone to the cancer center for her 40-month routine evaluation and to discuss imaging results.  Patient was noted to be hypoxic during the visit and pulse oximetry was in the 70s.  She was placed on oxygen via nasal cannula.       Assessment/Plan:   Principal Problem:   Acute respiratory failure (HCC) Active Problems:   Essential hypertension   Squamous cell carcinoma of head and neck (HCC)   CKD (chronic kidney disease) stage 3, GFR 30-59 ml/min   Acute CHF (congestive heart failure) (HCC)   COPD (chronic obstructive pulmonary disease) (HCC)   Acute lower UTI   Nutrition Problem: Severe Malnutrition Etiology: cancer and cancer related treatments, other (see comment) (COPD)  Signs/Symptoms: moderate fat depletion, severe fat depletion, severe muscle depletion   Body mass index is 22.98 kg/m.    Acute hypoxemic respiratory failure: She remains on 10 L/min oxygen via high flow nasal cannula.  Hypoxia is probably from combination of metastatic lung disease and CHF.  Acute diastolic CHF, small bilateral pleural effusions: Continue IV Lasix.  Follow-up BMP urine output and daily weight.  2D echo showed EF estimated at 65 to 67%, grade 1 diastolic dysfunction, moderate pericardial effusion with a cardiologist.  Suspected UTI: Continue IV Rocephin.  Urine culture is pending.  Stage IV head and neck cancer with metastasis to the lungs: Follow-up with  oncologist.  Continue enteral nutrition via PEG tube.  Dietitian has been consulted to assist with nutrition.  Continue analgesics for pain.  Follow-up with oncologist.  CKD stage IIIa: Creatinine is stable.    Hypertension: Continue Lasix  Mildly elevated troponin: This is likely from demand ischemia.  Goals of care: Discussed CODE STATUS with the patient and her husband.  Her husband wants her to remain a full code at this time with plan to continue cancer treatment when she is discharged from the hospital.   Diet Order            Diet NPO time specified  Diet effective now                    Consultants:  Cardiologist  Oncologist  Procedures:  None    Medications:    bacitracin  1 application Topical QODAY   cholestyramine  4 g Per Tube TID WC   clotrimazole   Topical QODAY   enoxaparin (LOVENOX) injection  40 mg Subcutaneous Q24H   feeding supplement  237 mL Per Tube 5 X Daily   feeding supplement (PROSource TF)  45 mL Per Tube BID   fentaNYL  1 patch Transdermal Q72H   free water  100 mL Per Tube 5 X Daily   furosemide  40 mg Intravenous Daily   mirtazapine  15 mg Oral QHS   pravastatin  10 mg Per Tube QHS   sodium chloride flush  3 mL Intravenous Q12H   Continuous Infusions:  sodium chloride 250 mL (07/20/20  2037)   cefTRIAXone (ROCEPHIN)  IV Stopped (07/20/20 2110)     Anti-infectives (From admission, onward)   Start     Dose/Rate Route Frequency Ordered Stop   07/19/20 2000  cefTRIAXone (ROCEPHIN) 1 g in sodium chloride 0.9 % 100 mL IVPB        1 g 200 mL/hr over 30 Minutes Intravenous Every 24 hours 07/19/20 1833               Family Communication/Anticipated D/C date and plan/Code Status   DVT prophylaxis: enoxaparin (LOVENOX) injection 40 mg Start: 07/19/20 2200     Code Status: Full Code  Family Communication: Plan discussed with husband at the bedside Disposition Plan:    Status is: Inpatient  Remains  inpatient appropriate because:IV treatments appropriate due to intensity of illness or inability to take PO and Inpatient level of care appropriate due to severity of illness   Dispo: The patient is from: Home              Anticipated d/c is to: Home              Anticipated d/c date is: 3 days              Patient currently is not medically stable to d/c.           Subjective:   Interval events noted.  No shortness of breath or chest pain.  Her husband is at the bedside.  Objective:    Vitals:   07/21/20 0400 07/21/20 0739 07/21/20 1230 07/21/20 1359  BP: (!) 150/76 (!) 173/88 (!) 151/80   Pulse: 85 87 88   Resp:  (!) 22 16   Temp: 97.8 F (36.6 C) 98 F (36.7 C) 98.1 F (36.7 C)   TempSrc: Axillary Axillary Oral   SpO2: 93% 94% 94% 91%  Weight:      Height:       No data found.   Intake/Output Summary (Last 24 hours) at 07/21/2020 1501 Last data filed at 07/21/2020 0322 Gross per 24 hour  Intake 200 ml  Output 260 ml  Net -60 ml   Filed Weights   07/19/20 1445 07/21/20 0320  Weight: 62.6 kg 62.6 kg    Exam:  GEN: NAD SKIN: Warm and dry EYES: No pallor or icterus ENT: MMM CV: RRR PULM: No wheezing or rales heard ABD: soft, ND, NT, +BS, + PEG tube in place CNS: AAO x 3, non focal EXT: No edema or tenderness      Data Reviewed:   I have personally reviewed following labs and imaging studies:  Labs: Labs show the following:   Basic Metabolic Panel: Recent Labs  Lab 07/19/20 0944 07/19/20 0944 07/19/20 1454 07/19/20 1454 07/20/20 0447 07/21/20 0619  NA 142  --  143  --  146* 141  K 4.4   < > 4.0   < > 3.6 3.5  CL 105  --  105  --  105 103  CO2 25  --  25  --  28 26  GLUCOSE 137*  --  115*  --  94 92  BUN 51*  --  48*  --  42* 41*  CREATININE 1.36*  --  1.26*  --  1.24* 1.08*  CALCIUM 8.6*  --  8.6*  --  8.2* 8.4*  MG  --   --   --   --  2.7* 2.6*   < > = values in this interval not displayed.  GFR Estimated Creatinine  Clearance: 39.3 mL/min (A) (by C-G formula based on SCr of 1.08 mg/dL (H)). Liver Function Tests: Recent Labs  Lab 07/19/20 0944  AST 31  ALT 19  ALKPHOS 115  BILITOT 0.5  PROT 7.3  ALBUMIN 3.6   No results for input(s): LIPASE, AMYLASE in the last 168 hours. No results for input(s): AMMONIA in the last 168 hours. Coagulation profile No results for input(s): INR, PROTIME in the last 168 hours.  CBC: Recent Labs  Lab 07/19/20 0944 07/19/20 1454  WBC 9.8 8.9  NEUTROABS 8.3*  --   HGB 15.6* 15.3*  HCT 50.0* 48.9*  MCV 95.6 95.7  PLT 385 371   Cardiac Enzymes: No results for input(s): CKTOTAL, CKMB, CKMBINDEX, TROPONINI in the last 168 hours. BNP (last 3 results) No results for input(s): PROBNP in the last 8760 hours. CBG: Recent Labs  Lab 07/20/20 2257 07/21/20 0541  GLUCAP 105* 97   D-Dimer: No results for input(s): DDIMER in the last 72 hours. Hgb A1c: No results for input(s): HGBA1C in the last 72 hours. Lipid Profile: No results for input(s): CHOL, HDL, LDLCALC, TRIG, CHOLHDL, LDLDIRECT in the last 72 hours. Thyroid function studies: Recent Labs    07/19/20 1223  TSH 1.000  T4TOTAL 9.8   Anemia work up: No results for input(s): VITAMINB12, FOLATE, FERRITIN, TIBC, IRON, RETICCTPCT in the last 72 hours. Sepsis Labs: Recent Labs  Lab 07/19/20 0944 07/19/20 1454  WBC 9.8 8.9    Microbiology Recent Results (from the past 240 hour(s))  Resp Panel by RT-PCR (Flu A&B, Covid) Nasopharyngeal Swab     Status: None   Collection Time: 07/19/20  3:16 PM   Specimen: Nasopharyngeal Swab; Nasopharyngeal(NP) swabs in vial transport medium  Result Value Ref Range Status   SARS Coronavirus 2 by RT PCR NEGATIVE NEGATIVE Final    Comment: (NOTE) SARS-CoV-2 target nucleic acids are NOT DETECTED.  The SARS-CoV-2 RNA is generally detectable in upper respiratory specimens during the acute phase of infection. The lowest concentration of SARS-CoV-2 viral copies this  assay can detect is 138 copies/mL. A negative result does not preclude SARS-Cov-2 infection and should not be used as the sole basis for treatment or other patient management decisions. A negative result may occur with  improper specimen collection/handling, submission of specimen other than nasopharyngeal swab, presence of viral mutation(s) within the areas targeted by this assay, and inadequate number of viral copies(<138 copies/mL). A negative result must be combined with clinical observations, patient history, and epidemiological information. The expected result is Negative.  Fact Sheet for Patients:  EntrepreneurPulse.com.au  Fact Sheet for Healthcare Providers:  IncredibleEmployment.be  This test is no t yet approved or cleared by the [place] FDA and  has been authorized for detection and/or diagnosis of SARS-CoV-2 by FDA under an Emergency Use Authorization (EUA). This EUA will remain  in effect (meaning this test can be used) for the duration of the COVID-19 declaration under Section 564(b)(1) of the Act, 21 U.S.C.section 360bbb-3(b)(1), unless the authorization is terminated  or revoked sooner.       Influenza A by PCR NEGATIVE NEGATIVE Final   Influenza B by PCR NEGATIVE NEGATIVE Final    Comment: (NOTE) The Xpert Xpress SARS-CoV-2/FLU/RSV plus assay is intended as an aid in the diagnosis of influenza from Nasopharyngeal swab specimens and should not be used as a sole basis for treatment. Nasal washings and aspirates are unacceptable for Xpert Xpress SARS-CoV-2/FLU/RSV testing.  Fact Sheet for Patients: EntrepreneurPulse.com.au  Fact Sheet for Healthcare Providers: IncredibleEmployment.be  This test is not yet approved or cleared by the [place] FDA and has been authorized for detection and/or diagnosis of SARS-CoV-2 by FDA under an Emergency Use Authorization (EUA). This EUA will  remain in effect (meaning this test can be used) for the duration of the COVID-19 declaration under Section 564(b)(1) of the Act, 21 U.S.C. section 360bbb-3(b)(1), unless the authorization is terminated or revoked.  Performed at [facility], [address], [place], [place] [address]     Procedures and diagnostic studies:  DG Chest [facility] 1 View  Result Date: 07/19/2020 CLINICAL DATA:  79 year old female with shortness of breath. EXAM: PORTABLE CHEST 1 VIEW COMPARISON:  Chest radiograph dated 11/15/2017. FINDINGS: There is cardiomegaly with vascular congestion and edema. Small bilateral pleural effusions, right greater left, with bibasilar atelectasis. Pneumonia is not excluded clinical correlation is recommended. No pneumothorax. Atherosclerotic calcification of the aorta. No acute osseous pathology. IMPRESSION: Cardiomegaly with findings of CHF and small bilateral pleural effusions. Pneumonia is not excluded. Electronically Signed   By: Anner Crete M.D.   On: 07/19/2020 15:32   ECHOCARDIOGRAM COMPLETE  Result Date: 07/20/2020    ECHOCARDIOGRAM REPORT   Patient Name:   Dana Perez Date of Exam: 07/20/2020 Medical Rec #:  [id number]         Height:       65.0 in Accession #:    [id number]        Weight:       138.0 lb Date of Birth:  06/15/1941         BSA:          1.690 m Patient Age:    58 years          BP:           157/76 mmHg Patient Gender: F                 HR:           96 bpm. Exam Location:  [facility] Procedure: 2D Echo, Cardiac Doppler and Color Doppler Indications:     CHF-acute diastolic 213.08  History:         Patient has no prior history of Echocardiogram examinations.                  Risk Factors:Dyslipidemia.  Sonographer:     Sherrie Sport RDCS (AE) Referring Phys:  [id number] Royce Macadamia AGBATA Diagnosing Phys: Yolonda Kida MD  Sonographer Comments: Technically challenging study due to limited acoustic windows, no apical window and no subcostal window. IMPRESSIONS   1. Left ventricular ejection fraction, by estimation, is 65 to 70%. The left ventricle has normal function. The left ventricle has no regional wall motion abnormalities. There is mild left ventricular hypertrophy. Left ventricular diastolic parameters are consistent with Grade I diastolic dysfunction (impaired relaxation).  2. Right ventricular systolic function is mildly reduced. The right ventricular size is moderately enlarged.  3. Moderate pericardial effusion.  4. The mitral valve is grossly normal. Mild mitral valve regurgitation.  5. The aortic valve is normal in structure. Aortic valve regurgitation is trivial. FINDINGS  Left Ventricle: Left ventricular ejection fraction, by estimation, is 65 to 70%. The left ventricle has normal function. The left ventricle has no regional wall motion abnormalities. The left ventricular internal cavity size was normal in size. There is  mild left ventricular hypertrophy. Left ventricular diastolic parameters are consistent with Grade I diastolic dysfunction (impaired relaxation).  Right Ventricle: The right ventricular size is moderately enlarged. No increase in right ventricular wall thickness. Right ventricular systolic function is mildly reduced. Left Atrium: Left atrial size was normal in size. Right Atrium: Right atrial size was normal in size. Pericardium: A moderately sized pericardial effusion is present. Mitral Valve: The mitral valve is grossly normal. Mild mitral valve regurgitation. Tricuspid Valve: The tricuspid valve is grossly normal. Tricuspid valve regurgitation is mild. Aortic Valve: The aortic valve is normal in structure. Aortic valve regurgitation is trivial. Pulmonic Valve: The pulmonic valve was grossly normal. Pulmonic valve regurgitation is not visualized. Aorta: The aortic arch was not well visualized. IAS/Shunts: No atrial level shunt detected by color flow Doppler.  LEFT VENTRICLE PLAX 2D LVIDd:         3.82 cm LVIDs:         2.48 cm LV PW:          0.92 cm LV IVS:        1.11 cm LVOT diam:     2.00 cm LVOT Area:     3.14 cm  LEFT ATRIUM         Index LA diam:    2.30 cm 1.36 cm/m                        PULMONIC VALVE AORTA                 PV Vmax:        0.67 m/s Ao Root diam: 2.30 cm PV Peak grad:   1.8 mmHg                       RVOT Peak grad: 2 mmHg  TRICUSPID VALVE TR Peak grad:   23.6 mmHg TR Vmax:        243.00 cm/s  SHUNTS Systemic Diam: 2.00 cm Dwayne Prince Rome MD Electronically signed by Yolonda Kida MD Signature Date/Time: 07/20/2020/3:01:26 PM    Final                LOS: 2 days   Korbin Mapps  [facility]   Pager on [email or web address]. If 7PM-7AM, please contact night-coverage at [email or web address]     07/21/2020, 3:01 PM

## 2020-07-22 LAB — BASIC METABOLIC PANEL
Anion gap: 13 (ref 5–15)
BUN: 44 mg/dL — ABNORMAL HIGH (ref 8–23)
CO2: 24 mmol/L (ref 22–32)
Calcium: 8.7 mg/dL — ABNORMAL LOW (ref 8.9–10.3)
Chloride: 102 mmol/L (ref 98–111)
Creatinine, Ser: 0.96 mg/dL (ref 0.44–1.00)
GFR, Estimated: >60 mL/min (ref >60)
Glucose, Bld: 85 mg/dL (ref 70–99)
Potassium: 3.5 mmol/L (ref 3.5–5.1)
Sodium: 139 mmol/L (ref 135–145)

## 2020-07-22 LAB — CBC WITH DIFFERENTIAL/PLATELET
Abs Immature Granulocytes: 0.02 10*3/uL (ref 0.00–0.07)
Basophils Absolute: 0 10*3/uL (ref 0.0–0.1)
Basophils Relative: 0 %
Eosinophils Absolute: 0.1 10*3/uL (ref 0.0–0.5)
Eosinophils Relative: 1 %
HCT: 46.5 % — ABNORMAL HIGH (ref 36.0–46.0)
Hemoglobin: 14.8 g/dL (ref 12.0–15.0)
Immature Granulocytes: 0 %
Lymphocytes Relative: 15 %
Lymphs Abs: 1.2 10*3/uL (ref 0.7–4.0)
MCH: 29.6 pg (ref 26.0–34.0)
MCHC: 31.8 g/dL (ref 30.0–36.0)
MCV: 93 fL (ref 80.0–100.0)
Monocytes Absolute: 0.6 10*3/uL (ref 0.1–1.0)
Monocytes Relative: 7 %
Neutro Abs: 6.1 10*3/uL (ref 1.7–7.7)
Neutrophils Relative %: 77 %
Platelets: 340 10*3/uL (ref 150–400)
RBC: 5 MIL/uL (ref 3.87–5.11)
RDW: 13.2 % (ref 11.5–15.5)
WBC: 8.1 10*3/uL (ref 4.0–10.5)
nRBC: 0 % (ref 0.0–0.2)

## 2020-07-22 LAB — BRAIN NATRIURETIC PEPTIDE: B Natriuretic Peptide: 104 pg/mL — ABNORMAL HIGH (ref 0.0–100.0)

## 2020-07-22 LAB — GLUCOSE, CAPILLARY: Glucose-Capillary: 88 mg/dL (ref 70–99)

## 2020-07-22 LAB — MAGNESIUM: Magnesium: 2.4 mg/dL (ref 1.7–2.4)

## 2020-07-22 NOTE — Progress Notes (Signed)
[facility] Cardiology    SUBJECTIVE: Patient in bed resting comfortably nauseous with vomiting today not eating much generalized weakness shortness of breath still hypoxic lethargic in bed   Vitals:   07/21/20 2005 07/22/20 0311 07/22/20 0755 07/22/20 0802  BP: (!) 141/78 (!) 141/73 125/76   Pulse: 88 86 85   Resp: 18  20   Temp: 98.4 F (36.9 C) 97.7 F (36.5 C) 98.6 F (37 C)   TempSrc: Oral Oral Axillary   SpO2: 94% 91% 96% 93%  Weight:  61.4 kg    Height:         Intake/Output Summary (Last 24 hours) at 07/22/2020 1035 Last data filed at 07/22/2020 3810 Gross per 24 hour  Intake 106.9 ml  Output 1230 ml  Net -1123.1 ml      PHYSICAL EXAM  General: Well developed, well nourished, in no acute distress HEENT:  Normocephalic and atramatic status post surgery healed scars from head and neck surgery Neck:  No JVD.  Status post trach site Lungs: Rhonchi bilaterally to auscultation and percussion. Heart: HRRR . Normal S1 and S2 without gallops or murmurs.  Abdomen: Bowel sounds are positive, abdomen soft and non-tender  Msk:  Back normal, normal gait. Normal strength and tone for age. Extremities: No clubbing, cyanosis or edema.   Neuro: Alert and oriented X 3. Psych:  Good affect, responds appropriately   LABS: Basic Metabolic Panel: Recent Labs    07/21/20 0619 07/22/20 0625  NA 141 139  K 3.5 3.5  CL 103 102  CO2 26 24  GLUCOSE 92 85  BUN 41* 44*  CREATININE 1.08* 0.96  CALCIUM 8.4* 8.7*  MG 2.6* 2.4   Liver Function Tests: No results for input(s): AST, ALT, ALKPHOS, BILITOT, PROT, ALBUMIN in the last 72 hours. No results for input(s): LIPASE, AMYLASE in the last 72 hours. CBC: Recent Labs    07/19/20 1454 07/22/20 0625  WBC 8.9 8.1  NEUTROABS  --  6.1  HGB 15.3* 14.8  HCT 48.9* 46.5*  MCV 95.7 93.0  PLT 371 340   Cardiac Enzymes: No results for input(s): CKTOTAL, CKMB, CKMBINDEX, TROPONINI in the last 72 hours. BNP: Invalid input(s):  POCBNP D-Dimer: No results for input(s): DDIMER in the last 72 hours. Hemoglobin A1C: No results for input(s): HGBA1C in the last 72 hours. Fasting Lipid Panel: No results for input(s): CHOL, HDL, LDLCALC, TRIG, CHOLHDL, LDLDIRECT in the last 72 hours. Thyroid Function Tests: Recent Labs    07/19/20 1223  TSH 1.000  T4TOTAL 9.8   Anemia Panel: No results for input(s): VITAMINB12, FOLATE, FERRITIN, TIBC, IRON, RETICCTPCT in the last 72 hours.  No results found.   Echo NLVF EF 60%  TELEMETRY:NSR 80 nsstw  ASSESSMENT AND PLAN:  Principal Problem:   Acute respiratory failure (HCC) Active Problems:   Essential hypertension   Squamous cell carcinoma of head and neck (HCC)   CKD (chronic kidney disease) stage 3, GFR 30-59 ml/min   Acute CHF (congestive heart failure) (HCC)   COPD (chronic obstructive pulmonary disease) (HCC)   Acute lower UTI    Plan Continue supplemental oxygen for hypoxemia Agree with inhalers for shortness of breath Consider nephrology input for chronic renal insufficiency Consider pulmonary input for COPD hypoxemia Maintain blood pressure control for hypertension Rate control anticoagulation for atrial fibrillation Continue conservative medical therapy Maintain respiratory support Follow-up oncology for squamous cell cancer   Yolonda Kida, MD 07/22/2020 10:35 AM

## 2020-07-22 NOTE — Progress Notes (Signed)
Progress Note    Dana Perez  QIO:[id number] DOB: May 29, 1941  DOA: 07/19/2020 PCP: [name], MD      Brief Narrative:    Medical records reviewed and are as summarized below:  Dana Perez is a 79 y.o. 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  She had gone to the cancer center for her 59-month routine evaluation and to discuss imaging results.  Patient was noted to be hypoxic during the visit and pulse oximetry was in the 70s.  She was placed on oxygen via nasal cannula.       Assessment/Plan:   Principal Problem:   Acute respiratory failure (HCC) Active Problems:   Essential hypertension   Squamous cell carcinoma of head and neck (HCC)   CKD (chronic kidney disease) stage 3, GFR 30-59 ml/min   Acute CHF (congestive heart failure) (HCC)   COPD (chronic obstructive pulmonary disease) (HCC)   Acute lower UTI   Nutrition Problem: Severe Malnutrition Etiology: cancer and cancer related treatments, other (see comment) (COPD)  Signs/Symptoms: moderate fat depletion, severe fat depletion, severe muscle depletion   Body mass index is 22.52 kg/m.    Acute hypoxemic respiratory failure: Oxygen therapy is down to 8 L/min via high flow nasal cannula.  Taper off oxygen as able.   Acute diastolic CHF, small bilateral pleural effusions: Continue IV Lasix.  Monitor BMP, urine output and daily weight. 2D echo showed EF estimated at 65 to 32%, grade 1 diastolic dysfunction, moderate pericardial effusion with a cardiologist.  Suspected UTI: Continue IV Rocephin.  Urine culture is pending.  Stage IV head and neck cancer with metastasis to the lungs: Follow-up with oncologist.  Continue enteral nutrition via PEG  tube.  Dietitian has been consulted to assist with nutrition.  Continue analgesics for pain.  Follow-up with oncologist.  CKD stage IIIa: Creatinine is trending down.  Continue to monitor.  Hypertension: Continue Lasix  Mildly elevated troponin: This is likely from demand ischemia.    Diet Order            Diet NPO time specified  Diet effective now                    Consultants:  Cardiologist  Oncologist  Procedures:  None    Medications:   . bacitracin  1 application Topical QODAY  . cholestyramine  4 g Per Tube TID WC  . clotrimazole   Topical QODAY  . enoxaparin (LOVENOX) injection  40 mg Subcutaneous Q24H  . feeding supplement  237 mL Per Tube 5 X Daily  . feeding supplement (PROSource TF)  45 mL Per Tube BID  . fentaNYL  1 patch Transdermal Q72H  . free water  100 mL Per Tube 5 X Daily  . furosemide  40 mg Intravenous Daily  . mirtazapine  15 mg Oral QHS  . pravastatin  10 mg Per Tube QHS  . sodium chloride flush  3 mL Intravenous Q12H   Continuous Infusions: . sodium chloride Stopped (07/21/20 2210)  . cefTRIAXone (ROCEPHIN)  IV Stopped (07/21/20 2110)     Anti-infectives (From admission, onward)   Start     Dose/Rate Route Frequency Ordered Stop   07/19/20 2000  cefTRIAXone (ROCEPHIN) 1 g in sodium  chloride 0.9 % 100 mL IVPB        1 g 200 mL/hr over 30 Minutes Intravenous Every 24 hours 07/19/20 1833               Family Communication/Anticipated D/C date and plan/Code Status   DVT prophylaxis: enoxaparin (LOVENOX) injection 40 mg Start: 07/19/20 2200     Code Status: Full Code  Family Communication: Plan discussed with husband at the bedside Disposition Plan:    Status is: Inpatient  Remains inpatient appropriate because:IV treatments appropriate due to intensity of illness or inability to take PO and Inpatient level of care appropriate due to severity of illness   Dispo: The patient is from: Home               Anticipated d/c is to: Home              Anticipated d/c date is: 2 days              Patient currently is not medically stable to d/c.           Subjective:   No shortness of breath, cough, wheezing or chest pain.  Her husband is at the bedside   Objective:    Vitals:   07/21/20 2005 07/22/20 0311 07/22/20 0755 07/22/20 0802  BP: (!) 141/78 (!) 141/73 125/76   Pulse: 88 86 85   Resp: 18  20   Temp: 98.4 F (36.9 C) 97.7 F (36.5 C) 98.6 F (37 C)   TempSrc: Oral Oral Axillary   SpO2: 94% 91% 96% 93%  Weight:  61.4 kg    Height:       No data found.   Intake/Output Summary (Last 24 hours) at 07/22/2020 1322 Last data filed at 07/22/2020 0650 Gross per 24 hour  Intake 106.9 ml  Output 730 ml  Net -623.1 ml   Filed Weights   07/19/20 1445 07/21/20 0320 07/22/20 0311  Weight: 62.6 kg 62.6 kg 61.4 kg    Exam:  GEN: NAD SKIN: Warm and dry EYES: EOMI ENT: MMM CV: RRR PULM: CTA B ABD: soft, ND, NT, +BS, +PEG ube CNS: AAO x 3, non focal EXT: No edema or tenderness       Data Reviewed:   I have personally reviewed following labs and imaging studies:  Labs: Labs show the following:   Basic Metabolic Panel: Recent Labs  Lab 07/19/20 0944 07/19/20 0944 07/19/20 1454 07/19/20 1454 07/20/20 0447 07/20/20 0447 07/21/20 0619 07/22/20 0625  NA 142  --  143  --  146*  --  141 139  K 4.4   < > 4.0   < > 3.6   < > 3.5 3.5  CL 105  --  105  --  105  --  103 102  CO2 25  --  25  --  28  --  26 24  GLUCOSE 137*  --  115*  --  94  --  92 85  BUN 51*  --  48*  --  42*  --  41* 44*  CREATININE 1.36*  --  1.26*  --  1.24*  --  1.08* 0.96  CALCIUM 8.6*  --  8.6*  --  8.2*  --  8.4* 8.7*  MG  --   --   --   --  2.7*  --  2.6* 2.4   < > = values in this interval not displayed.   GFR Estimated Creatinine Clearance: 44.2  mL/min (by C-G formula based on SCr of 0.96 mg/dL). Liver Function Tests: Recent Labs  Lab 07/19/20 0944  AST 31  ALT 19   ALKPHOS 115  BILITOT 0.5  PROT 7.3  ALBUMIN 3.6   No results for input(s): LIPASE, AMYLASE in the last 168 hours. No results for input(s): AMMONIA in the last 168 hours. Coagulation profile No results for input(s): INR, PROTIME in the last 168 hours.  CBC: Recent Labs  Lab 07/19/20 0944 07/19/20 1454 07/22/20 0625  WBC 9.8 8.9 8.1  NEUTROABS 8.3*  --  6.1  HGB 15.6* 15.3* 14.8  HCT 50.0* 48.9* 46.5*  MCV 95.6 95.7 93.0  PLT 385 371 340   Cardiac Enzymes: No results for input(s): CKTOTAL, CKMB, CKMBINDEX, TROPONINI in the last 168 hours. BNP (last 3 results) No results for input(s): PROBNP in the last 8760 hours. CBG: Recent Labs  Lab 07/20/20 2257 07/21/20 0541 07/21/20 2318 07/22/20 0642  GLUCAP 105* 97 95 88   D-Dimer: No results for input(s): DDIMER in the last 72 hours. Hgb A1c: No results for input(s): HGBA1C in the last 72 hours. Lipid Profile: No results for input(s): CHOL, HDL, LDLCALC, TRIG, CHOLHDL, LDLDIRECT in the last 72 hours. Thyroid function studies: No results for input(s): TSH, T4TOTAL, T3FREE, THYROIDAB in the last 72 hours.  Invalid input(s): FREET3 Anemia work up: No results for input(s): VITAMINB12, FOLATE, FERRITIN, TIBC, IRON, RETICCTPCT in the last 72 hours. Sepsis Labs: Recent Labs  Lab 07/19/20 0944 07/19/20 1454 07/22/20 0625  WBC 9.8 8.9 8.1    Microbiology Recent Results (from the past 240 hour(s))  Resp Panel by RT-PCR (Flu A&B, Covid) Nasopharyngeal Swab     Status: None   Collection Time: 07/19/20  3:16 PM   Specimen: Nasopharyngeal Swab; Nasopharyngeal(NP) swabs in vial transport medium  Result Value Ref Range Status   SARS Coronavirus 2 by RT PCR NEGATIVE NEGATIVE Final    Comment: (NOTE) SARS-CoV-2 target nucleic acids are NOT DETECTED.  The SARS-CoV-2 RNA is generally detectable in upper respiratory specimens during the acute phase of infection. The lowest concentration of SARS-CoV-2 viral copies this assay  can detect is 138 copies/mL. A negative result does not preclude SARS-Cov-2 infection and should not be used as the sole basis for treatment or other patient management decisions. A negative result may occur with  improper specimen collection/handling, submission of specimen other than nasopharyngeal swab, presence of viral mutation(s) within the areas targeted by this assay, and inadequate number of viral copies(<138 copies/mL). A negative result must be combined with clinical observations, patient history, and epidemiological information. The expected result is Negative.  Fact Sheet for Patients:  EntrepreneurPulse.com.au  Fact Sheet for Healthcare Providers:  IncredibleEmployment.be  This test is no t yet approved or cleared by the [place] FDA and  has been authorized for detection and/or diagnosis of SARS-CoV-2 by FDA under an Emergency Use Authorization (EUA). This EUA will remain  in effect (meaning this test can be used) for the duration of the COVID-19 declaration under Section 564(b)(1) of the Act, 21 U.S.C.section 360bbb-3(b)(1), unless the authorization is terminated  or revoked sooner.       Influenza A by PCR NEGATIVE NEGATIVE Final   Influenza B by PCR NEGATIVE NEGATIVE Final    Comment: (NOTE) The Xpert Xpress SARS-CoV-2/FLU/RSV plus assay is intended as an aid in the diagnosis of influenza from Nasopharyngeal swab specimens and should not be used as a sole basis for treatment. Nasal washings and aspirates are  unacceptable for Xpert Xpress SARS-CoV-2/FLU/RSV testing.  Fact Sheet for Patients: EntrepreneurPulse.com.au  Fact Sheet for Healthcare Providers: IncredibleEmployment.be  This test is not yet approved or cleared by the [place] FDA and has been authorized for detection and/or diagnosis of SARS-CoV-2 by FDA under an Emergency Use Authorization (EUA). This EUA will  remain in effect (meaning this test can be used) for the duration of the COVID-19 declaration under Section 564(b)(1) of the Act, 21 U.S.C. section 360bbb-3(b)(1), unless the authorization is terminated or revoked.  Performed at [facility], [address] [place], [place] [address]     Procedures and diagnostic studies:  No results found.             LOS: 3 days   Angelyse Heslin  [facility]   Pager on [email or web address]. If 7PM-7AM, please contact night-coverage at [email or web address]     07/22/2020, 1:22 PM

## 2020-07-22 NOTE — Plan of Care (Signed)
  Problem: Clinical Measurements: Goal: Respiratory complications will improve Outcome: Progressing   Problem: Safety: Goal: Ability to remain free from injury will improve Outcome: Progressing   

## 2020-07-23 LAB — URINE CULTURE: Culture: NO GROWTH

## 2020-07-23 LAB — BASIC METABOLIC PANEL
Anion gap: 11 (ref 5–15)
BUN: 53 mg/dL — ABNORMAL HIGH (ref 8–23)
CO2: 26 mmol/L (ref 22–32)
Calcium: 9.1 mg/dL (ref 8.9–10.3)
Chloride: 103 mmol/L (ref 98–111)
Creatinine, Ser: 1.2 mg/dL — ABNORMAL HIGH (ref 0.44–1.00)
GFR, Estimated: 47 mL/min — ABNORMAL LOW (ref >60)
Glucose, Bld: 106 mg/dL — ABNORMAL HIGH (ref 70–99)
Potassium: 3.3 mmol/L — ABNORMAL LOW (ref 3.5–5.1)
Sodium: 140 mmol/L (ref 135–145)

## 2020-07-23 LAB — MAGNESIUM: Magnesium: 2.5 mg/dL — ABNORMAL HIGH (ref 1.7–2.4)

## 2020-07-23 MED ORDER — PREDNISONE 20 MG PO TABS
40.0000 mg | ORAL_TABLET | Freq: Every day | ORAL | Status: AC
  Administered 2020-07-23 – 2020-07-27 (×5): 40 mg via ORAL
  Filled 2020-07-23 (×5): qty 2

## 2020-07-23 MED ORDER — POTASSIUM CHLORIDE CRYS ER 20 MEQ PO TBCR
40.0000 meq | EXTENDED_RELEASE_TABLET | Freq: Once | ORAL | Status: AC
  Administered 2020-07-23: 40 meq via ORAL
  Filled 2020-07-23: qty 2

## 2020-07-23 NOTE — Care Management Important Message (Signed)
Important Message  Patient Details  Name: Dana Perez MRN: [id number] Date of Birth: 04-Apr-1941   Medicare Important Message Given:  Yes     [name] 07/23/2020, 1:35 PM

## 2020-07-23 NOTE — Progress Notes (Addendum)
Progress Note    Dana Perez  TWS:[id number] DOB: [birth year]  DOA: 07/19/2020 PCP: [name], MD      Brief Narrative:    Medical records reviewed and are as summarized below:  Dana Perez is a 79 y.o. 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  She had gone to the cancer center for her 11-month routine evaluation and to discuss imaging results.  Patient was noted to be hypoxic during the visit and pulse oximetry was in the 70s.  She was placed on oxygen via nasal cannula.    Assessment/Plan:   Principal Problem:   Acute respiratory failure (HCC) Active Problems:   Essential hypertension   Squamous cell carcinoma of head and neck (HCC)   CKD (chronic kidney disease) stage 3, GFR 30-59 ml/min   Acute CHF (congestive heart failure) (HCC)   COPD (chronic obstructive pulmonary disease) (HCC)   Acute lower UTI   Nutrition Problem: Severe Malnutrition Etiology: cancer and cancer related treatments, other (see comment) (COPD)  Signs/Symptoms: moderate fat depletion, severe fat depletion, severe muscle depletion   Body mass index is 21.95 kg/m.    Acute hypoxemic respiratory failure: Oxygen therapy is down to 8 L/min via high flow nasal cannula.   - Wean oxygen as able.  - Start Prednisone 40 mg daily x 5 days and continue nebulizer treatments to optimize respiratory function.  Acute diastolic CHF, small bilateral pleural effusions: 2D echo showed EF estimated at 65 to 00%, grade 1 diastolic dysfunction, moderate pericardial effusion with a cardiologist. - Continue IV Lasix 40  Mg daily.   - Monitor I/Os and daily weights. - Maintain low sodium diet.   Suspected UTI on  admission: Urine cultures shows no growth. - S/P 5 days of IV Rocephin.  Discontinue.  Stage IV head and neck cancer with metastasis to the lungs:  - Continue enteral nutrition via PEG tube.  Dietitian has been consulted to assist with nutrition.  Continue analgesics for pain.   - Follow-up with oncologist outpatient.   CKD stage IIIa: Creatinine is trending down.   - Continue to monitor.  Hypertension: Continue Lasix  Mildly elevated troponin: This is type 2 demand ischemia.  Oropharyngeal Dysphagia s/p multiple radiation treatments - Unable to eat anything by mouth. - PEG tube was placed on 10/2017 and is exchanged q55months.    Diet Order            Diet NPO time specified  Diet effective now                 Consultants:  Cardiologist  Oncologist  Procedures:  None   Medications:   . bacitracin  1 application Topical QODAY  . cholestyramine  4 g Per Tube TID WC  . clotrimazole   Topical QODAY  . enoxaparin (LOVENOX) injection  40 mg Subcutaneous Q24H  . feeding supplement  237 mL Per Tube 5 X Daily  . feeding supplement (PROSource TF)  45 mL Per Tube BID  . fentaNYL  1 patch Transdermal Q72H  . free water  100 mL Per Tube 5 X Daily  . furosemide  40 mg Intravenous Daily  . mirtazapine  15 mg Oral QHS  . pravastatin  10 mg  Per Tube QHS  . predniSONE  40 mg Oral Q breakfast  . sodium chloride flush  3 mL Intravenous Q12H   Continuous Infusions: . sodium chloride 250 mL (07/22/20 1952)  . cefTRIAXone (ROCEPHIN)  IV 1 g (07/22/20 1955)     Anti-infectives (From admission, onward)   Start     Dose/Rate Route Frequency Ordered Stop   07/19/20 2000  cefTRIAXone (ROCEPHIN) 1 g in sodium chloride 0.9 % 100 mL IVPB        1 g 200 mL/hr over 30 Minutes Intravenous Every 24 hours 07/19/20 1833 07/23/20 2359       Family Communication/Anticipated D/C date and plan/Code Status   DVT prophylaxis: enoxaparin (LOVENOX) injection 40 mg Start: 07/19/20  2200     Code Status: Full Code  Family Communication: Plan discussed with husband at the bedside Disposition Plan:    Status is: Inpatient  Remains inpatient appropriate because:IV treatments appropriate due to intensity of illness or inability to take PO and Inpatient level of care appropriate due to severity of illness   Dispo: The patient is from: Home              Anticipated d/c is to: Home              Anticipated d/c date is: 2 days              Patient currently is not medically stable to d/c.    Subjective:   No shortness of breath, cough, wheezing or chest pain.  Her husband is at the bedside   Objective:    Vitals:   07/23/20 1200 07/23/20 1400 07/23/20 1600 07/23/20 1653  BP: (!) 121/54  121/64 117/69  Pulse: 86 82 (!) 105 92  Resp: (!) 22 (!) 21 (!) 24 (!) 21  Temp:   97.7 F (36.5 C) (!) 97.5 F (36.4 C)  TempSrc:   Axillary Axillary  SpO2: 93% 93% 94% 92%  Weight:      Height:       No data found.   Intake/Output Summary (Last 24 hours) at 07/23/2020 1857 Last data filed at 07/23/2020 1658 Gross per 24 hour  Intake 1137 ml  Output 700 ml  Net 437 ml   Filed Weights   07/21/20 0320 07/22/20 0311 07/23/20 0401  Weight: 62.6 kg 61.4 kg 59.8 kg    Exam:  GEN: chronically ill appearing, weak, fatigued SKIN: Warm and dry EYES: EOMI ENT: MMM CV: RRR, did not appreciate a murmur PULM: CTA B ABD: soft, ND, NT, +BS, +PEG ube CNS: AAO x 3, non focal EXT: No edema or tenderness   Data Reviewed:   I have personally reviewed following labs and imaging studies:  Labs: Labs show the following:   Basic Metabolic Panel: Recent Labs  Lab 07/19/20 1454 07/19/20 1454 07/20/20 0447 07/20/20 0447 07/21/20 0619 07/21/20 0619 07/22/20 0625 07/23/20 0508  NA 143  --  146*  --  141  --  139 140  K 4.0   < > 3.6   < > 3.5   < > 3.5 3.3*  CL 105  --  105  --  103  --  102 103  CO2 25  --  28  --  26  --  24 26  GLUCOSE 115*  --  94  --  92   --  85 106*  BUN 48*  --  42*  --  41*  --  44* 53*  CREATININE  1.26*  --  1.24*  --  1.08*  --  0.96 1.20*  CALCIUM 8.6*  --  8.2*  --  8.4*  --  8.7* 9.1  MG  --   --  2.7*  --  2.6*  --  2.4 2.5*   < > = values in this interval not displayed.   GFR Estimated Creatinine Clearance: 35.3 mL/min (A) (by C-G formula based on SCr of 1.2 mg/dL (H)). Liver Function Tests: Recent Labs  Lab 07/19/20 0944  AST 31  ALT 19  ALKPHOS 115  BILITOT 0.5  PROT 7.3  ALBUMIN 3.6    CBC: Recent Labs  Lab 07/19/20 0944 07/19/20 1454 07/22/20 0625  WBC 9.8 8.9 8.1  NEUTROABS 8.3*  --  6.1  HGB 15.6* 15.3* 14.8  HCT 50.0* 48.9* 46.5*  MCV 95.6 95.7 93.0  PLT 385 371 340   CBG: Recent Labs  Lab 07/20/20 2257 07/21/20 0541 07/21/20 2318 07/22/20 0642  GLUCAP 105* 97 95 88   Sepsis Labs: Recent Labs  Lab 07/19/20 0944 07/19/20 1454 07/22/20 0625  WBC 9.8 8.9 8.1    Microbiology Recent Results (from the past 240 hour(s))  Resp Panel by RT-PCR (Flu A&B, Covid) Nasopharyngeal Swab     Status: None   Collection Time: 07/19/20  3:16 PM   Specimen: Nasopharyngeal Swab; Nasopharyngeal(NP) swabs in vial transport medium  Result Value Ref Range Status   SARS Coronavirus 2 by RT PCR NEGATIVE NEGATIVE Final    Comment: (NOTE) SARS-CoV-2 target nucleic acids are NOT DETECTED.  The SARS-CoV-2 RNA is generally detectable in upper respiratory specimens during the acute phase of infection. The lowest concentration of SARS-CoV-2 viral copies this assay can detect is 138 copies/mL. A negative result does not preclude SARS-Cov-2 infection and should not be used as the sole basis for treatment or other patient management decisions. A negative result may occur with  improper specimen collection/handling, submission of specimen other than nasopharyngeal swab, presence of viral mutation(s) within the areas targeted by this assay, and inadequate number of viral copies(<138 copies/mL). A  negative result must be combined with clinical observations, patient history, and epidemiological information. The expected result is Negative.  Fact Sheet for Patients:  EntrepreneurPulse.com.au  Fact Sheet for Healthcare Providers:  IncredibleEmployment.be  This test is no t yet approved or cleared by the [place] FDA and  has been authorized for detection and/or diagnosis of SARS-CoV-2 by FDA under an Emergency Use Authorization (EUA). This EUA will remain  in effect (meaning this test can be used) for the duration of the COVID-19 declaration under Section 564(b)(1) of the Act, 21 U.S.C.section 360bbb-3(b)(1), unless the authorization is terminated  or revoked sooner.       Influenza A by PCR NEGATIVE NEGATIVE Final   Influenza B by PCR NEGATIVE NEGATIVE Final    Comment: (NOTE) The Xpert Xpress SARS-CoV-2/FLU/RSV plus assay is intended as an aid in the diagnosis of influenza from Nasopharyngeal swab specimens and should not be used as a sole basis for treatment. Nasal washings and aspirates are unacceptable for Xpert Xpress SARS-CoV-2/FLU/RSV testing.  Fact Sheet for Patients: EntrepreneurPulse.com.au  Fact Sheet for Healthcare Providers: IncredibleEmployment.be  This test is not yet approved or cleared by the [place] FDA and has been authorized for detection and/or diagnosis of SARS-CoV-2 by FDA under an Emergency Use Authorization (EUA). This EUA will remain in effect (meaning this test can be used) for the duration of the COVID-19 declaration under Section 564(b)(1) of  the Act, 21 U.S.C. section 360bbb-3(b)(1), unless the authorization is terminated or revoked.  Performed at [facility], [address], [place], [place] [address]   Urine Culture     Status: None   Collection Time: 07/21/20  6:47 AM   Specimen: Urine, Random  Result Value Ref Range Status   Specimen  Description   Final    URINE, RANDOM Performed at [facility], [address], [place], [place] [address]    Special Requests   Final    NONE Performed at [facility], [address], [place], [place] [address]    Culture   Final    NO GROWTH Performed at [facility], [place] [address], [place], [place] [address]    Report Status 07/23/2020 FINAL  Final    Procedures and diagnostic studies:  No results found.    LOS: 4 days   George Hugh  [facility]   Pager on [email or web address]. If 7PM-7AM, please contact night-coverage at [email or web address]   07/23/2020, 6:57 PM

## 2020-07-23 NOTE — Progress Notes (Signed)
[facility] [place] [facility]  Patient Description: Dana Perez is a 79 y/o female with PMH significant for HTN,  CKD Stage III, COPD, progressive stage Iva head and neck squamous cell carcinoma and adenocarcinoma of the colon who was admitted for acute respiratory failure. Cardiology consulted for possible acute CHF.   SUBJECTIVE: The patient reports to be doing okay and she denies having any chest pain or worsening dyspnea at this time. The husband is at the bedside and reports that the patient has had good output and has not complained of any pain or edema.   OBJECTIVE: The patient did not speak much, appears chronically ill and is no apparent distress. She is resting comfortably in bed, on supplemental oxygen with normal effort of breathing. She is in NSR with HR of 97 bpm on the bedside monitor and she appears euvolemic.   Vitals:   07/22/20 2031 07/23/20 0401 07/23/20 0803 07/23/20 1155  BP: (!) 101/58 (!) 98/55 124/85 (!) 113/55  Pulse: (!) 57 80 87 (!) 48  Resp:   (!) 22 20  Temp: 98.3 F (36.8 C) 97.7 F (36.5 C) 98.7 F (37.1 C) (!) 97.5 F (36.4 C)  TempSrc: Oral Oral Axillary Axillary  SpO2: 92% 94% 91% 94%  Weight:  59.8 kg    Height:         Intake/Output Summary (Last 24 hours) at 07/23/2020 1300 Last data filed at 07/23/2020 1033 Gross per 24 hour  Intake 437 ml  Output 0 ml  Net 437 ml      PHYSICAL EXAM  General: appears chronically ill,  in no acute distress HEENT:  Normocephalic and atraumatic.  Neck:  No JVD.  Lungs: Clear bilaterally to upper lobes, diminished in lower lobes upon auscultation. Chest expansion appears symmetrical. Normal effort of breathing.  Heart: HRRR . Normal S1 and S2 without gallops or murmurs.  Abdomen: Bowel sounds are positive, abdomen soft and non-tender  Msk: Decreased strength and tone for age. Extremities: No clubbing, cyanosis or edema.   Neuro: Alert and oriented X 2. Psych:  Good affect, responds appropriately   LABS: Basic  Metabolic Panel: Recent Labs    07/22/20 0625 07/23/20 0508  NA 139 140  K 3.5 3.3*  CL 102 103  CO2 24 26  GLUCOSE 85 106*  BUN 44* 53*  CREATININE 0.96 1.20*  CALCIUM 8.7* 9.1  MG 2.4 2.5*   Liver Function Tests: No results for input(s): AST, ALT, ALKPHOS, BILITOT, PROT, ALBUMIN in the last 72 hours. No results for input(s): LIPASE, AMYLASE in the last 72 hours. CBC: Recent Labs    07/22/20 0625  WBC 8.1  NEUTROABS 6.1  HGB 14.8  HCT 46.5*  MCV 93.0  PLT 340   Cardiac Enzymes: No results for input(s): CKTOTAL, CKMB, CKMBINDEX, TROPONINI in the last 72 hours. BNP: Invalid input(s): POCBNP D-Dimer: No results for input(s): DDIMER in the last 72 hours. Hemoglobin A1C: No results for input(s): HGBA1C in the last 72 hours. Fasting Lipid Panel: No results for input(s): CHOL, HDL, LDLCALC, TRIG, CHOLHDL, LDLDIRECT in the last 72 hours. Thyroid Function Tests: No results for input(s): TSH, T4TOTAL, T3FREE, THYROIDAB in the last 72 hours.  Invalid input(s): FREET3 Anemia Panel: No results for input(s): VITAMINB12, FOLATE, FERRITIN, TIBC, IRON, RETICCTPCT in the last 72 hours.  No results found.   Echo: IMPRESSIONS  1. Left ventricular ejection fraction, by estimation, is 65 to 70%. The  left ventricle has normal function. The left ventricle has no regional  wall  motion abnormalities. There is mild left ventricular hypertrophy.  Left ventricular diastolic parameters  are consistent with Grade I diastolic dysfunction (impaired relaxation).  2. Right ventricular systolic function is mildly reduced. The right  ventricular size is moderately enlarged.  3. Moderate pericardial effusion.  4. The mitral valve is grossly normal. Mild mitral valve regurgitation.  5. The aortic valve is normal in structure. Aortic valve regurgitation is  trivial.   TELEMETRY: NSR with HR of 97 bpm  ASSESSMENT AND PLAN:  Principal Problem:   Acute respiratory failure (HCC) Active  Problems:   Essential hypertension   Squamous cell carcinoma of head and neck (HCC)   CKD (chronic kidney disease) stage 3, GFR 30-59 ml/min   Acute CHF (congestive heart failure) (HCC)   COPD (chronic obstructive pulmonary disease) (HCC)   Acute lower UTI    1. Acute CHF, stable, grade I diastolic dysfunction, patient appears euvolemic  -Recommend low dose diuretic therapy.  - Invasive Cardiac studies are not recommended at this time.  -Monitor strict I&O's, follow low sodium diet and perform daily weights.   -Monitor renal functioning.   2. Acute respiratory failure, fairly stable  -Agree with current management and supplement oxygen.   3. HTN, controlled, patient is normotensive  -Recommend conditioning to hold antihypertensive at this time in the presence of lower blood pressures.   4. CKD stage III, fairly stable  -agree with current therapy.  - recommend low doses diuresis with lasix.   5. Squamous cell carcinoma of head and neck  -Management per Oncology.     Collins, [id number]-AG  07/23/2020 1:00 PM

## 2020-07-24 ENCOUNTER — Inpatient Hospital Stay
Admit: 2020-07-24 | Discharge: 2020-07-24 | Disposition: A | Discharge status: Home / Self Care | Payer: PPO | Attending: Internal Medicine | Admitting: Internal Medicine

## 2020-07-24 LAB — BASIC METABOLIC PANEL
Anion gap: 11 (ref 5–15)
BUN: 56 mg/dL — ABNORMAL HIGH (ref 8–23)
CO2: 26 mmol/L (ref 22–32)
Calcium: 9.2 mg/dL (ref 8.9–10.3)
Chloride: 102 mmol/L (ref 98–111)
Creatinine, Ser: 1.01 mg/dL — ABNORMAL HIGH (ref 0.44–1.00)
GFR, Estimated: 57 mL/min — ABNORMAL LOW (ref >60)
Glucose, Bld: 107 mg/dL — ABNORMAL HIGH (ref 70–99)
Potassium: 3.5 mmol/L (ref 3.5–5.1)
Sodium: 139 mmol/L (ref 135–145)

## 2020-07-24 LAB — CBC
HCT: 46.8 % — ABNORMAL HIGH (ref 36.0–46.0)
Hemoglobin: 15 g/dL (ref 12.0–15.0)
MCH: 29.4 pg (ref 26.0–34.0)
MCHC: 32.1 g/dL (ref 30.0–36.0)
MCV: 91.8 fL (ref 80.0–100.0)
Platelets: 345 10*3/uL (ref 150–400)
RBC: 5.1 MIL/uL (ref 3.87–5.11)
RDW: 13 % (ref 11.5–15.5)
WBC: 8.5 10*3/uL (ref 4.0–10.5)
nRBC: 0 % (ref 0.0–0.2)

## 2020-07-24 LAB — ECHOCARDIOGRAM LIMITED
Height: 65 in
S' Lateral: 3.26 cm
Weight: 2144.63 oz

## 2020-07-24 MED ORDER — AZITHROMYCIN 250 MG PO TABS
500.0000 mg | ORAL_TABLET | Freq: Every day | ORAL | Status: AC
  Administered 2020-07-24 – 2020-07-26 (×3): 500 mg via ORAL
  Filled 2020-07-24 (×3): qty 2

## 2020-07-24 MED ORDER — POTASSIUM CHLORIDE CRYS ER 20 MEQ PO TBCR
20.0000 meq | EXTENDED_RELEASE_TABLET | Freq: Once | ORAL | Status: AC
  Administered 2020-07-24: 20 meq via ORAL
  Filled 2020-07-24: qty 1

## 2020-07-24 MED ORDER — ALBUTEROL SULFATE HFA 108 (90 BASE) MCG/ACT IN AERS
1.0000 | INHALATION_SPRAY | RESPIRATORY_TRACT | Status: DC | PRN
  Filled 2020-07-24: qty 6.7

## 2020-07-24 MED ORDER — BUDESONIDE 0.25 MG/2ML IN SUSP
0.2500 mg | Freq: Two times a day (BID) | RESPIRATORY_TRACT | Status: DC
  Administered 2020-07-24 – 2020-07-27 (×7): 0.25 mg via RESPIRATORY_TRACT
  Filled 2020-07-24 (×7): qty 2

## 2020-07-24 MED ORDER — METHYLPREDNISOLONE SODIUM SUCC 125 MG IJ SOLR
125.0000 mg | Freq: Once | INTRAMUSCULAR | Status: AC
  Administered 2020-07-24: 125 mg via INTRAVENOUS
  Filled 2020-07-24: qty 2

## 2020-07-24 MED ORDER — FUROSEMIDE 10 MG/ML IJ SOLN
20.0000 mg | Freq: Every day | INTRAMUSCULAR | Status: DC
  Administered 2020-07-25: 20 mg via INTRAVENOUS
  Filled 2020-07-24: qty 2

## 2020-07-24 MED ORDER — IPRATROPIUM-ALBUTEROL 0.5-2.5 (3) MG/3ML IN SOLN
3.0000 mL | RESPIRATORY_TRACT | Status: DC
  Administered 2020-07-24 – 2020-07-27 (×14): 3 mL via RESPIRATORY_TRACT
  Filled 2020-07-24 (×13): qty 3

## 2020-07-24 NOTE — Progress Notes (Signed)
*  PRELIMINARY RESULTS* Echocardiogram 2D Echocardiogram has been performed.  Dana Perez 07/24/2020, 2:18 PM

## 2020-07-24 NOTE — Progress Notes (Signed)
Mobility Specialist - Progress Note   07/24/20 1345  Mobility  Activity  (bed exercises)  Level of Assistance Standby assist, set-up cues, supervision of patient - no hands on (cues for exercise)  Mobility Response Tolerated well  Mobility performed by Mobility specialist  $Mobility charge 1 Mobility    Pre-mobility: 97 HR, 92% SpO2 Post-mobility: 89 HR, 95% SpO2   Pt laying in bed upon arrival. Pt agreed to session w/ head nod. Noted pt doesn't verbally respond, mainly responds w/ head nod. Family member in room explaining pt was ambulating w/ walker and not on O2 PTA. Pt performed bed-level exercises: ankle pumps x 10, slr x 10, heel slides x 10, hip ab/ad x 10. Pt SBA, needing cues for exercises. O2 sat 87-93% during exercise. Pt on 8L HFNC. Overall, pt tolerated session well. No c/o pain or SOB. Pt remained in bed at the end of session w/ all needs placed in reach.    Merick Kelleher Mobility Specialist  07/24/20, 1:50 PM

## 2020-07-24 NOTE — Progress Notes (Signed)
[place]  [phone] [phone]  ID: JACARI KIRSTEN OB: 11/27/1940  MR#: [id number]  MLY#:[id number]  Patient Care Team: [name], MD as PCP - General (Physician Assistant) [name], MD as Referring Physician (Surgery) [name], MD as Consulting Physician (Oncology)  CHIEF COMPLAINT: Acute respiratory failure, CHF versus COPD.  Metastatic head and neck cancer.  INTERVAL HISTORY: Patient lying in bed alert, but entire history is given by husband who was at bedside.  Breathing has improved and oxygen has been weaned down to 8 L.  REVIEW OF SYSTEMS:   Review of Systems  Unable to perform ROS: Patient nonverbal   Entire review of systems is given by husband.  PAST MEDICAL HISTORY: Past Medical History:  Diagnosis Date  . Acute hypoxemic respiratory failure ([place]) 11/15/2017  . Acute respiratory failure ([place]) 11/15/2017  . Adenocarcinoma of colon ([place])   . Benign neoplasm of ascending colon   . CAP (community acquired pneumonia) 11/15/2017  . Essential hypertension 06/13/2014  . Goals of care, counseling/discussion 07/14/2017  . Healthcare-associated pneumonia   . HLD (hyperlipidemia) 06/13/2014  . Hypercholesteremia   . Hyperlipidemia   . Melena   . Multiple thyroid nodules 01/22/2012  . Osteoporosis   . Polycythemia, secondary 12/26/2014  . Pure hypercholesterolemia 03/15/2015  . Squamous cell carcinoma of head and neck ([place])   . Thyroid nodule     PAST SURGICAL HISTORY: Past Surgical History:  Procedure Laterality Date  . ABDOMINAL HYSTERECTOMY    . COLONOSCOPY WITH PROPOFOL N/A 07/02/2017   Procedure: COLONOSCOPY WITH PROPOFOL;  Surgeon: Lucilla Lame, MD;  Location: [place];  Service: Endoscopy;  Laterality: N/A;  . ESOPHAGOGASTRODUODENOSCOPY N/A 05/17/2019   Procedure: ESOPHAGOGASTRODUODENOSCOPY (EGD);  Surgeon: Lin Landsman, MD;  Location: [facility] ENDOSCOPY;  Service:  Gastroenterology;  Laterality: N/A;  . ORIF ANKLE FRACTURE Left 08/08/2017   Procedure: OPEN REDUCTION INTERNAL FIXATION (ORIF) ANKLE FRACTURE;  Surgeon: Dereck Leep, MD;  Location: [facility];  Service: Orthopedics;  Laterality: Left;  . POLYPECTOMY N/A 07/02/2017   Procedure: POLYPECTOMY;  Surgeon: Lucilla Lame, MD;  Location: [place];  Service: Endoscopy;  Laterality: N/A;  . WRIST FRACTURE SURGERY  08/2009   badly broken    FAMILY HISTORY: Family History  Problem Relation Age of Onset  . Heart disease Mother   . Diabetes Father     ADVANCED DIRECTIVES (Y/N):  @ADVDIR @  HEALTH MAINTENANCE: Social History   Tobacco Use  . Smoking status: Former Smoker    Packs/day: 0.25    Types: Cigarettes    Quit date: 06/17/2017    Years since quitting: 3.1  . Smokeless tobacco: Never Used  Vaping Use  . Vaping Use: Never used  Substance Use Topics  . Alcohol use: No  . Drug use: No     Colonoscopy:  PAP:  Bone density:  Lipid panel:  Allergies  Allergen Reactions  . Buprenorphine Nausea Only and Nausea And Vomiting  . Other Other (See Comments) and Rash    TDAP tdap TDAP  . Tetanus Antitoxin Rash    Current Facility-Administered Medications  Medication Dose Route Frequency Provider Last Rate Last Admin  . 0.9 %  sodium chloride infusion  250 mL Intravenous PRN Agbata, Tochukwu, MD 10 mL/hr at 07/23/20 2059 250 mL at 07/23/20 2059  . acetaminophen (TYLENOL) tablet 650 mg  650 mg Oral Q6H PRN Agbata, Tochukwu, MD      . albuterol (VENTOLIN HFA) 108 (90 Base)  MCG/ACT inhaler 1-2 puff  1-2 puff Inhalation Q4H PRN George Hugh, MD      . azithromycin Azucena Fallen) tablet 500 mg  500 mg Oral Daily George Hugh, MD   500 mg at 07/24/20 2028  . bacitracin ointment 1 application  1 application Topical Tonye Pearson, MD   1 application at 57/84/69 1032  . budesonide (PULMICORT) nebulizer solution 0.25 mg  0.25 mg Nebulization BID George Hugh, MD   0.25 mg  at 07/24/20 1244  . chlorpheniramine-HYDROcodone (TUSSIONEX) 10-8 MG/5ML suspension 5 mL  5 mL Per Tube Q12H PRN Agbata, Tochukwu, MD      . cholestyramine (QUESTRAN) packet 4 g  4 g Per Tube TID WC Jennye Boroughs, MD   4 g at 07/24/20 1625  . clotrimazole (LOTRIMIN) 1 % cream   Topical Tonye Pearson, MD   Given at 07/24/20 1032  . enoxaparin (LOVENOX) injection 40 mg  40 mg Subcutaneous Q24H Agbata, Tochukwu, MD   40 mg at 07/24/20 2026  . feeding supplement (ENSURE ENLIVE / ENSURE PLUS) liquid 237 mL  237 mL Per Tube 5 X Daily Jennye Boroughs, MD   237 mL at 07/24/20 1833  . feeding supplement (PROSource TF) liquid 45 mL  45 mL Per Tube BID Jennye Boroughs, MD   45 mL at 07/24/20 2024  . fentaNYL (DURAGESIC) 50 MCG/HR 1 patch  1 patch Transdermal Q72H Jennye Boroughs, MD   1 patch at 07/24/20 1042  . free water 100 mL  100 mL Per Tube 5 X Daily Jennye Boroughs, MD   100 mL at 07/24/20 1834  . [START ON 07/25/2020] furosemide (LASIX) injection 20 mg  20 mg Intravenous Daily White, Delicia, NP      . hydrOXYzine (ATARAX/VISTARIL) tablet 10 mg  10 mg Per Tube BID PRN Agbata, Tochukwu, MD      . ipratropium-albuterol (DUONEB) 0.5-2.5 (3) MG/3ML nebulizer solution 3 mL  3 mL Nebulization Q4H while awake George Hugh, MD   3 mL at 07/24/20 1619  . mirtazapine (REMERON) tablet 15 mg  15 mg Oral QHS Agbata, Tochukwu, MD   15 mg at 07/24/20 2024  . morphine CONCENTRATE 10 MG/0.5ML oral solution 5 mg  5 mg Per Tube Q4H PRN Agbata, Tochukwu, MD      . ondansetron (ZOFRAN) injection 4 mg  4 mg Intravenous Q6H PRN Agbata, Tochukwu, MD   4 mg at 07/24/20 1048  . pravastatin (PRAVACHOL) tablet 10 mg  10 mg Per Tube QHS Agbata, Tochukwu, MD   10 mg at 07/24/20 2024  . predniSONE (DELTASONE) tablet 40 mg  40 mg Oral Q breakfast George Hugh, MD   40 mg at 07/24/20 1029  . sodium chloride flush (NS) 0.9 % injection 3 mL  3 mL Intravenous Q12H Agbata, Tochukwu, MD   3 mL at 07/24/20 2026  . sodium chloride  flush (NS) 0.9 % injection 3 mL  3 mL Intravenous PRN Agbata, Tochukwu, MD       Facility-Administered Medications Ordered in Other Encounters  Medication Dose Route Frequency Provider Last Rate Last Admin  . heparin lock flush 100 unit/mL  250 Units Intracatheter PRN Leia Alf, MD      . heparin lock flush 100 unit/mL  500 Units Intracatheter PRN Leia Alf, MD      . sodium chloride 0.9 % injection 10 mL  10 mL Intracatheter PRN Ma Hillock, Sandeep, MD      . sodium chloride 0.9 % injection 10 mL  10 mL  Intracatheter PRN Leia Alf, MD      . sodium chloride 0.9 % injection 10 mL  10 mL Intracatheter PRN Ma Hillock, Sandeep, MD      . sodium chloride 0.9 % injection 10 mL  10 mL Intracatheter PRN Leia Alf, MD        OBJECTIVE: Vitals:   07/24/20 2005 07/24/20 2025  BP:  (!) 149/72  Pulse: (!) 110 (!) 108  Resp: 20 20  Temp:  98.1 F (36.7 C)  SpO2: 92% 94%     Body mass index is 22.31 kg/m.    ECOG FS:4 - Bedbound  General: Ill-appearing, mild respiratory distress. Eyes: Pink conjunctiva, anicteric sclera. HEENT: Normocephalic, moist mucous membranes. Lungs: Diminished breath sounds. Heart: Regular rate and rhythm. Abdomen: Feeding tube in place. Musculoskeletal: No edema, cyanosis, or clubbing. Neuro: Alert. Cranial nerves grossly intact. Skin: No rashes or petechiae noted. Psych: Flat affect.  LAB RESULTS:  Lab Results  Component Value Date   NA 139 07/24/2020   K 3.5 07/24/2020   CL 102 07/24/2020   CO2 26 07/24/2020   GLUCOSE 107 (H) 07/24/2020   BUN 56 (H) 07/24/2020   CREATININE 1.01 (H) 07/24/2020   CALCIUM 9.2 07/24/2020   PROT 7.3 07/19/2020   ALBUMIN 3.6 07/19/2020   AST 31 07/19/2020   ALT 19 07/19/2020   ALKPHOS 115 07/19/2020   BILITOT 0.5 07/19/2020   GFRNONAA 57 (L) 07/24/2020   GFRAA >60 03/22/2020    Lab Results  Component Value Date   WBC 8.5 07/24/2020   NEUTROABS 6.1 07/22/2020   HGB 15.0 07/24/2020   HCT 46.8 (H)  07/24/2020   MCV 91.8 07/24/2020   PLT 345 07/24/2020     STUDIES: CT SOFT TISSUE NECK W CONTRAST  Result Date: 06/26/2020 CLINICAL DATA:  Follow-up head and neck cancer. Squamous cell carcinoma. Surveillance imaging. EXAM: CT NECK WITH CONTRAST TECHNIQUE: Multidetector CT imaging of the neck was performed using the standard protocol following the bolus administration of intravenous contrast. Not utilized. Stenosis is probably in the range of 50% on both sides. Diminutive right jugular vein. CONTRAST:  148mL OMNIPAQUE IOHEXOL 300 MG/ML  SOLN COMPARISON:  12/07/2019.  06/07/2019.  05/08/2017. FINDINGS: Pharynx and larynx: No mucosal or submucosal lesion. Salivary glands: Left submandibular gland is diminutive but normal. Right submandibular gland surgically absent. Parotid glands are normal. Thyroid: Normal Lymph nodes: No residual or recurrent lymphadenopathy. Previous right mandibular resection with regional flap reconstruction. Similar appearance of the soft tissues of the region without evidence of recurrent mass. Vascular: Bilateral carotid atherosclerotic disease at the bifurcations. CT angiographic technique was not utilized. Stenosis estimated at approximately 50% on both sides. Diminutive right jugular vein. Limited intracranial: Negative Visualized orbits: Negative Mastoids and visualized paranasal sinuses: Clear Skeleton: Ordinary cervical spondylosis. Right mandibular resection as noted above. No malignant bone findings. Upper chest: See results of chest CT. 1.5 cm density right upper lobe. Other: None IMPRESSION: 1. Previous right mandibular resection with regional flap reconstruction. Similar appearance of the soft tissues of the region without evidence of recurrent mass. No residual or recurrent lymphadenopathy. 2. Bilateral carotid atherosclerotic disease. Stenosis estimated at approximately 50% on both sides. CT angiographic technique was not utilized. 3. 1.5 cm density right upper lobe.   See results of chest CT. Electronically Signed   By: Nelson Chimes M.D.   On: 06/26/2020 11:20   CT CHEST ABDOMEN PELVIS W CONTRAST  Result Date: 06/26/2020 CLINICAL DATA:  Metastatic head and neck cancer, history of  colon cancer EXAM: CT CHEST, ABDOMEN, AND PELVIS WITH CONTRAST TECHNIQUE: Multidetector CT imaging of the chest, abdomen and pelvis was performed following the standard protocol during bolus administration of intravenous contrast. CONTRAST:  127mL OMNIPAQUE IOHEXOL 300 MG/ML SOLN, additional oral enteric contrast COMPARISON:  12/07/2019 FINDINGS: CT CHEST FINDINGS Cardiovascular: Aortic atherosclerosis. Normal heart size. Left and right coronary artery calcifications. Small pericardial effusion, unchanged. Mediastinum/Nodes: No enlarged mediastinal, hilar, or axillary lymph nodes. Thyroid gland, trachea, and esophagus demonstrate no significant findings. Lungs/Pleura: Severe centrilobular emphysema. There are multiple new bilateral pulmonary nodules, in the peripheral right apex measuring 1.5 x 1.4 cm (series 8, image 24), in the anterior right middle lobe measuring 1.8 x 1.2 cm (series 8, image 84) and in the superior segment left lower lobe measuring 2.2 x 1.7 cm (series 8, image 82). No pleural effusion or pneumothorax. Musculoskeletal: No chest wall mass or suspicious bone lesions identified. CT ABDOMEN PELVIS FINDINGS Hepatobiliary: No solid liver abnormality is seen. No gallstones, gallbladder wall thickening, or biliary dilatation. Pancreas: Unremarkable. No pancreatic ductal dilatation or surrounding inflammatory changes. Spleen: Normal in size without significant abnormality. Adrenals/Urinary Tract: Adrenal glands are unremarkable. Kidneys are normal, without renal calculi, solid lesion, or hydronephrosis. Bladder is unremarkable. Stomach/Bowel: Percutaneous gastrostomy. Stomach is otherwise within normal limits. Appendix is not clearly visualized. No evidence of bowel wall thickening,  distention, or inflammatory changes. Vascular/Lymphatic: No significant vascular findings are present. No enlarged abdominal or pelvic lymph nodes. Reproductive: Status post hysterectomy. Other: No abdominal wall hernia or abnormality. No abdominopelvic ascites. Musculoskeletal: No acute or significant osseous findings. IMPRESSION: 1. There are multiple new bilateral pulmonary nodules, most consistent with new pulmonary metastatic disease although conceivably infectious or inflammatory in nature. Consider tissue sampling. 2. No evidence of metastatic disease within the abdomen or pelvis. 3. Emphysema (ICD10-J43.9). 4. Small, unchanged pericardial effusion. 5. Coronary artery disease.  Aortic Atherosclerosis (ICD10-I70.0). Electronically Signed   By: Eddie Candle M.D.   On: 06/26/2020 15:57   DG Chest Port 1 View  Result Date: 07/19/2020 CLINICAL DATA:  79 year old female with shortness of breath. EXAM: PORTABLE CHEST 1 VIEW COMPARISON:  Chest radiograph dated 11/15/2017. FINDINGS: There is cardiomegaly with vascular congestion and edema. Small bilateral pleural effusions, right greater left, with bibasilar atelectasis. Pneumonia is not excluded clinical correlation is recommended. No pneumothorax. Atherosclerotic calcification of the aorta. No acute osseous pathology. IMPRESSION: Cardiomegaly with findings of CHF and small bilateral pleural effusions. Pneumonia is not excluded. Electronically Signed   By: Anner Crete M.D.   On: 07/19/2020 15:32   ECHOCARDIOGRAM COMPLETE  Result Date: 07/20/2020    ECHOCARDIOGRAM REPORT   Patient Name:   INNOCENCE SCHLOTZHAUER Date of Exam: 07/20/2020 Medical Rec #:  [id number]         Height:       65.0 in Accession #:    [id number]        Weight:       138.0 lb Date of Birth:  06-16-1941         BSA:          1.690 m Patient Age:    51 years          BP:           157/76 mmHg Patient Gender: F                 HR:           96 bpm. Exam Location:  [facility] Procedure: 2D Echo,  Cardiac Doppler and Color Doppler Indications:     CHF-acute diastolic 093.26  History:         Patient has no prior history of Echocardiogram examinations.                  Risk Factors:Dyslipidemia.  Sonographer:     Sherrie Sport RDCS (AE) Referring Phys:  [id number] Royce Macadamia AGBATA Diagnosing Phys: Yolonda Kida MD  Sonographer Comments: Technically challenging study due to limited acoustic windows, no apical window and no subcostal window. IMPRESSIONS  1. Left ventricular ejection fraction, by estimation, is 65 to 70%. The left ventricle has normal function. The left ventricle has no regional wall motion abnormalities. There is mild left ventricular hypertrophy. Left ventricular diastolic parameters are consistent with Grade I diastolic dysfunction (impaired relaxation).  2. Right ventricular systolic function is mildly reduced. The right ventricular size is moderately enlarged.  3. Moderate pericardial effusion.  4. The mitral valve is grossly normal. Mild mitral valve regurgitation.  5. The aortic valve is normal in structure. Aortic valve regurgitation is trivial. FINDINGS  Left Ventricle: Left ventricular ejection fraction, by estimation, is 65 to 70%. The left ventricle has normal function. The left ventricle has no regional wall motion abnormalities. The left ventricular internal cavity size was normal in size. There is  mild left ventricular hypertrophy. Left ventricular diastolic parameters are consistent with Grade I diastolic dysfunction (impaired relaxation). Right Ventricle: The right ventricular size is moderately enlarged. No increase in right ventricular wall thickness. Right ventricular systolic function is mildly reduced. Left Atrium: Left atrial size was normal in size. Right Atrium: Right atrial size was normal in size. Pericardium: A moderately sized pericardial effusion is present. Mitral Valve: The mitral valve is grossly normal. Mild mitral valve regurgitation. Tricuspid Valve: The tricuspid  valve is grossly normal. Tricuspid valve regurgitation is mild. Aortic Valve: The aortic valve is normal in structure. Aortic valve regurgitation is trivial. Pulmonic Valve: The pulmonic valve was grossly normal. Pulmonic valve regurgitation is not visualized. Aorta: The aortic arch was not well visualized. IAS/Shunts: No atrial level shunt detected by color flow Doppler.  LEFT VENTRICLE PLAX 2D LVIDd:         3.82 cm LVIDs:         2.48 cm LV PW:         0.92 cm LV IVS:        1.11 cm LVOT diam:     2.00 cm LVOT Area:     3.14 cm  LEFT ATRIUM         Index LA diam:    2.30 cm 1.36 cm/m                        PULMONIC VALVE AORTA                 PV Vmax:        0.67 m/s Ao Root diam: 2.30 cm PV Peak grad:   1.8 mmHg                       RVOT Peak grad: 2 mmHg  TRICUSPID VALVE TR Peak grad:   23.6 mmHg TR Vmax:        243.00 cm/s  SHUNTS Systemic Diam: 2.00 cm Yolonda Kida MD Electronically signed by Yolonda Kida MD Signature Date/Time: 07/20/2020/3:01:26 PM    Final    ECHOCARDIOGRAM LIMITED  Result Date: 07/24/2020  ECHOCARDIOGRAM LIMITED REPORT   Patient Name:   SHARAN MCENANEY Date of Exam: 07/24/2020 Medical Rec #:  [id number]         Height:       65.0 in Accession #:    [id number]        Weight:       134.0 lb Date of Birth:  1941-05-24         BSA:          1.669 m Patient Age:    27 years          BP:           156/70 mmHg Patient Gender: F                 HR:           108 bpm. Exam Location:  [facility] Procedure: Limited Echo, Limited Color Doppler and Cardiac Doppler Indications:     I31.3 Pericardial Effusion  History:         Patient has prior history of Echocardiogram examinations, most                  recent 07/20/2020. Risk Factors:HCL, Dyslipidemia and                  Hypertension.  Sonographer:     Charmayne Sheer RDCS (AE) Referring Phys:  [id number] [facility] MASOUD Diagnosing Phys: Bartholome Bill MD  Sonographer Comments: Technically difficult study due to poor echo windows. IMPRESSIONS  1.  Left ventricular ejection fraction, by estimation, is 50 to 55%. The left ventricle has low normal function. The left ventricle has no regional wall motion abnormalities. Left ventricular diastolic parameters were normal.  2. Right ventricular systolic function is normal. The right ventricular size is mildly enlarged.  3. Moderate pericardial effusion. There is no evidence of cardiac tamponade.  4. The mitral valve was not well visualized. Trivial mitral valve regurgitation.  5. The aortic valve was not well visualized. Aortic valve regurgitation is not visualized. FINDINGS  Left Ventricle: Left ventricular ejection fraction, by estimation, is 50 to 55%. The left ventricle has low normal function. The left ventricle has no regional wall motion abnormalities. The left ventricular internal cavity size was normal in size. There is no left ventricular hypertrophy. Left ventricular diastolic parameters were normal. Right Ventricle: The right ventricular size is mildly enlarged. No increase in right ventricular wall thickness. Right ventricular systolic function is normal. Pericardium: A moderately sized pericardial effusion is present. There is no evidence of cardiac tamponade. Mitral Valve: The mitral valve was not well visualized. Trivial mitral valve regurgitation. Tricuspid Valve: The tricuspid valve is not well visualized. Tricuspid valve regurgitation is trivial. Aortic Valve: The aortic valve was not well visualized. Aortic valve regurgitation is not visualized. Pulmonic Valve: The pulmonic valve was not well visualized. LEFT VENTRICLE PLAX 2D LVIDd:         3.99 cm LVIDs:         3.26 cm LV PW:         0.96 cm LV IVS:        0.97 cm  Bartholome Bill MD Electronically signed by Bartholome Bill MD Signature Date/Time: 07/24/2020/3:50:25 PM    Final     ASSESSMENT: Acute respiratory failure, CHF versus COPD.  Metastatic head and neck cancer.  PLAN:    1.  Acute respiratory failure: CHF versus COPD.  Patient's  oxygenation status is improving and she has been weaned down to  8 L via nasal cannula.  She is currently on IV Lasix, steroids, and duo nebs.  She has completed antibiotics.  Appreciate pulmonary input.  Recent echo revealed grade 1 diastolic heart failure with an EF of 65 to 70%. 2.  Metastatic head and neck cancer: Patient received 1 dose of Opdivo last week.  CT scan results as above.  Patient has been has been instructed to keep her previously scheduled follow-up appointment on August 02, 2020 in the [facility] for further evaluation and discussion on whether or not to continue treatments. 3.  Malnutrition: Patient receives 100% of her nutrition via PEG tube.  Will follow.  [name], MD   07/24/2020 8:52 PM

## 2020-07-24 NOTE — Progress Notes (Signed)
[facility] Cardiology  Patient Description: [name] is a 79 y/o female with PMH significant for HTN,  CKD Stage III, COPD, progressive stage Iva head and neck squamous cell carcinoma and adenocarcinoma of the colon who was admitted for acute respiratory failure. Cardiology consulted for possible acute CHF.   SUBJECTIVE: Today the patient reports that she is not feeling well. She denies having any chest pain or dyspnea. Her husband is at the bedside and reports that the patient states "she said that she is not in pain, she just does not feel well."   12/7//21: The patient reports to be doing okay and she denies having any chest pain or worsening dyspnea at this time. The husband is at the bedside and reports that the patient has had good output and has not complained of any pain or edema.   OBJECTIVE: The patient is resting in bed and is less communicative on today. Her bedside ECG reveals NSR with a HR of 93 bpm and she appears to be euvolemic.   07/24/20: The patient did not speak much, appears chronically ill and is no apparent distress. She is resting comfortably in bed, on supplemental oxygen with normal effort of breathing. She is in NSR with HR of 97 bpm on the bedside monitor and she appears euvolemic.   Vitals:   07/24/20 0400 07/24/20 0439 07/24/20 0800 07/24/20 1140  BP: 117/63  (!) 131/59 (!) 156/70  Pulse: 70  83 91  Resp: (!) 22  18 (!) 25  Temp: 97.8 F (36.6 C)  97.7 F (36.5 C) (!) 97.5 F (36.4 C)  TempSrc: Axillary  Oral Oral  SpO2: 95%  93% 92%  Weight:  60.8 kg    Height:         Intake/Output Summary (Last 24 hours) at 07/24/2020 1417 Last data filed at 07/24/2020 1246 Gross per 24 hour  Intake 2287 ml  Output 1050 ml  Net 1237 ml      PHYSICAL EXAM  General: appears chronically ill,  in no acute distress HEENT:  Normocephalic and atraumatic.  Neck:  No JVD. Negative for rigidity.  Lungs: Clear bilaterally to upper lobes, diminished in lower lobes upon  auscultation. Chest expansion appears symmetrical. Normal effort of breathing.  Heart: HRRR . Normal S1 and S2 without gallops or murmurs.  Abdomen: Bowel sounds are positive, abdomen soft and non-tender  Msk: Decreased strength and tone for age. Extremities: No clubbing, cyanosis or edema.  +2 peripheral pulses.  Neuro: Alert and oriented X 2. Psych:  flat affect, responds appropriately   LABS: Basic Metabolic Panel: Recent Labs    07/22/20 0625 07/22/20 0625 07/23/20 0508 07/24/20 0553  NA 139   < > 140 139  K 3.5   < > 3.3* 3.5  CL 102   < > 103 102  CO2 24   < > 26 26  GLUCOSE 85   < > 106* 107*  BUN 44*   < > 53* 56*  CREATININE 0.96   < > 1.20* 1.01*  CALCIUM 8.7*   < > 9.1 9.2  MG 2.4  --  2.5*  --    < > = values in this interval not displayed.   Liver Function Tests: No results for input(s): AST, ALT, ALKPHOS, BILITOT, PROT, ALBUMIN in the last 72 hours. No results for input(s): LIPASE, AMYLASE in the last 72 hours. CBC: Recent Labs    07/22/20 0625 07/24/20 0553  WBC 8.1 8.5  NEUTROABS 6.1  --  HGB 14.8 15.0  HCT 46.5* 46.8*  MCV 93.0 91.8  PLT 340 345   Cardiac Enzymes: No results for input(s): CKTOTAL, CKMB, CKMBINDEX, TROPONINI in the last 72 hours. BNP: Invalid input(s): POCBNP D-Dimer: No results for input(s): DDIMER in the last 72 hours. Hemoglobin A1C: No results for input(s): HGBA1C in the last 72 hours. Fasting Lipid Panel: No results for input(s): CHOL, HDL, LDLCALC, TRIG, CHOLHDL, LDLDIRECT in the last 72 hours. Thyroid Function Tests: No results for input(s): TSH, T4TOTAL, T3FREE, THYROIDAB in the last 72 hours.  Invalid input(s): FREET3 Anemia Panel: No results for input(s): VITAMINB12, FOLATE, FERRITIN, TIBC, IRON, RETICCTPCT in the last 72 hours.  No results found.   Echo: IMPRESSIONS  1. Left ventricular ejection fraction, by estimation, is 65 to 70%. The  left ventricle has normal function. The left ventricle has no  regional  wall motion abnormalities. There is mild left ventricular hypertrophy.  Left ventricular diastolic parameters  are consistent with Grade I diastolic dysfunction (impaired relaxation).  2. Right ventricular systolic function is mildly reduced. The right  ventricular size is moderately enlarged.  3. Moderate pericardial effusion.  4. The mitral valve is grossly normal. Mild mitral valve regurgitation.  5. The aortic valve is normal in structure. Aortic valve regurgitation is  trivial.   TELEMETRY: NSR with HR of 97 bpm  ASSESSMENT AND PLAN:  Principal Problem:   Acute respiratory failure (HCC) Active Problems:   Essential hypertension   Squamous cell carcinoma of head and neck (HCC)   CKD (chronic kidney disease) stage 3, GFR 30-59 ml/min   Acute CHF (congestive heart failure) (HCC)   COPD (chronic obstructive pulmonary disease) (HCC)   Acute lower UTI    1. Acute CHF, stable, grade I diastolic dysfunction, patient appears euvolemic  -Recommend decreasing IV lasix to 20mg  once daily.   - Invasive Cardiac studies are not recommended at this time.  -Monitor strict I&O's, follow low sodium diet and perform daily weights.   -Monitor renal functioning.   -Continuous telemetry monitoring.   2. Acute respiratory failure, fairly stable  -Agree with current management and supplemental oxygen.   3. HTN, controlled, patient is normotensive  -Recommend contiuing to hold antihypertensive at this time in the presence of lower blood pressures.   4. CKD stage III, fairly stable  -agree with current therapy.  - recommend decreasing lasix.   5. Squamous cell carcinoma of head and neck  -Management per Oncology.     Jewett, ACNPC-AG  07/24/2020 2:17 PM

## 2020-07-24 NOTE — Progress Notes (Addendum)
Progress Note    Dana Perez  WVP:[id number] DOB: Jun 10, 1941  DOA: 07/19/2020 PCP: [name], MD      Brief Narrative:    Medical records reviewed and are as summarized below:  Dana Perez is a 79 y.o. female with medical history of Chronic Tobacco Use and COPD, Stage 4 squamous cell carcinoma of the head and neck s/p 2019 right segmental mandibulectomy and pectoralis flap as well as CRT and immunotherapy, adenocarcinoma of the colon, Stage 3 CKD, osteoporosis and history of fractures, and severe protein calorie malnutrition s/p 2019 PEG tube placement who presented to the ED with acute hypoxia requiring 15L [place] found to have new lung metastases.   Assessment/Plan:   Principal Problem:   Acute respiratory failure (HCC) Active Problems:   Essential hypertension   Squamous cell carcinoma of head and neck (HCC)   CKD (chronic kidney disease) stage 3, GFR 30-59 ml/min   Acute CHF (congestive heart failure) (HCC)   COPD (chronic obstructive pulmonary disease) (HCC)   Acute lower UTI  Nutrition Problem: Severe Malnutrition Etiology: cancer and cancer related treatments, other (see comment) (COPD) Signs/Symptoms: moderate fat depletion, severe fat depletion, severe muscle depletion Body mass index is 22.31 kg/m.    Acute Hypoxic Respiratory Failure - acute CHF vs acute COPD in the setting of limited respiratory reserve: Oxygen has been weaned down to 8L [place].  CT scan showed new lung metastases and no PE. - Continue IV Lasix daily until clinically improves more. - S/P course of IV Ceftriaxone. - This is Day 2/7 of steroid treatment. - Continue Budesonide and Duo-Nebs. - Add on Azithromycin 500 mg daily x 3 days. - Consult Pulmonary for assistance, appreciate.  Acute CHF: Echo shows grade 1 diastolic heart failure with EF 65-70%. - Continue IV Lasix QD and monitor I/Os.  Moderate Pericardial Effusion - Repeat limited echo to re-evaluate.  Suspected  UTI on admission: Urine cultures shows no growth. - S/P 5 days of IV Rocephin.   Stage IV head and neck cancer  - Follow up with Heme/Onc outpatient.  Severe Protein Calorie Malnutrition / Oropharyngeal Dysphagia s/p 2019 PEG tube placement - Continue enteral nutrition via PEG tube.   - Continue exchanges q33months.  CKD Stage 3 - Monitor creatinine while on Lasix.  Hypertension - BP stable.  Hyperlipidemia - Statin  Insomnia - Mirtazepine nightly.  Hearing Impairment   Diet Order            Diet NPO time specified  Diet effective now                Consultants:  Cardiologist  Oncologist  Procedures:  None   Medications:   . bacitracin  1 application Topical QODAY  . budesonide (PULMICORT) nebulizer solution  0.25 mg Nebulization BID  . cholestyramine  4 g Per Tube TID WC  . clotrimazole   Topical QODAY  . enoxaparin (LOVENOX) injection  40 mg Subcutaneous Q24H  . feeding supplement  237 mL Per Tube 5 X Daily  . feeding supplement (PROSource TF)  45 mL Per Tube BID  . fentaNYL  1 patch Transdermal Q72H  . free water  100 mL Per Tube 5 X Daily  . [START ON 07/25/2020] furosemide  20 mg Intravenous Daily  . ipratropium-albuterol  3 mL Nebulization Q4H while awake  . mirtazapine  15 mg Oral QHS  . pravastatin  10 mg Per Tube QHS  . predniSONE  40 mg Oral Q  breakfast  . sodium chloride flush  3 mL Intravenous Q12H   Continuous Infusions: . sodium chloride 250 mL (07/23/20 2059)     Anti-infectives (From admission, onward)   Start     Dose/Rate Route Frequency Ordered Stop   07/19/20 2000  cefTRIAXone (ROCEPHIN) 1 g in sodium chloride 0.9 % 100 mL IVPB        1 g 200 mL/hr over 30 Minutes Intravenous Every 24 hours 07/19/20 1833 07/23/20 2131       Family Communication/Anticipated D/C date and plan/Code Status   DVT prophylaxis: enoxaparin (LOVENOX) injection 40 mg Start: 07/19/20 2200     Code Status: Full Code  Family Communication: Plan  discussed with husband at the bedside Disposition Plan:    Status is: Inpatient  Remains inpatient appropriate because:IV treatments appropriate due to intensity of illness or inability to take PO and Inpatient level of care appropriate due to severity of illness   Dispo: The patient is from: Home              Anticipated d/c is to: Home  Discharge will likely be on home oxygen.  Will organize when weaned to 4-6L [place] and stable.  If unable, will need to consult hospice services.              Anticipated d/c date is: 2 days              Patient currently is not medically stable to d/c.   Subjective:   Patient is looking better. She states she feels better. Oxygen remains at 8L [employer]. Patient denies chest pain or shortness of breath. All questions answered at bedside.   Objective:    Vitals:   07/24/20 0800 07/24/20 1140 07/24/20 1500 07/24/20 1600  BP: (!) 131/59 (!) 156/70  (!) 151/70  Pulse: 83 91 94 98  Resp: 18 (!) 25 (!) 21 16  Temp: 97.7 F (36.5 C) (!) 97.5 F (36.4 C)  97.9 F (36.6 C)  TempSrc: Oral Oral  Oral  SpO2: 93% 92% 93% 95%  Weight:      Height:       No data found.   Intake/Output Summary (Last 24 hours) at 07/24/2020 1815 Last data filed at 07/24/2020 1628 Gross per 24 hour  Intake 2087 ml  Output 650 ml  Net 1437 ml   Filed Weights   07/22/20 0311 07/23/20 0401 07/24/20 0439  Weight: 61.4 kg 59.8 kg 60.8 kg    Exam:  GEN: chronically ill appearing, weak, fatigued, malnourished SKIN: Warm and dry EYES: EOMI ENT: MMM CV: RRR, did not appreciate a murmur PULM: CTA B ABD: soft, ND, NT, +BS, +PEG ube CNS: AAO x 3, non focal EXT: No edema or tenderness   Data Reviewed:   I have personally reviewed following labs and imaging studies:  Labs: Labs show the following:   Basic Metabolic Panel: Recent Labs  Lab 07/20/20 0447 07/20/20 0447 07/21/20 0619 07/21/20 0619 07/22/20 0625 07/22/20 0625 07/23/20 0508 07/24/20 0553  NA  146*  --  141  --  139  --  140 139  K 3.6   < > 3.5   < > 3.5   < > 3.3* 3.5  CL 105  --  103  --  102  --  103 102  CO2 28  --  26  --  24  --  26 26  GLUCOSE 94  --  92  --  85  --  106*  107*  BUN 42*  --  41*  --  44*  --  53* 56*  CREATININE 1.24*  --  1.08*  --  0.96  --  1.20* 1.01*  CALCIUM 8.2*  --  8.4*  --  8.7*  --  9.1 9.2  MG 2.7*  --  2.6*  --  2.4  --  2.5*  --    < > = values in this interval not displayed.   GFR Estimated Creatinine Clearance: 42 mL/min (A) (by C-G formula based on SCr of 1.01 mg/dL (H)). Liver Function Tests: Recent Labs  Lab 07/19/20 0944  AST 31  ALT 19  ALKPHOS 115  BILITOT 0.5  PROT 7.3  ALBUMIN 3.6    CBC: Recent Labs  Lab 07/19/20 0944 07/19/20 1454 07/22/20 0625 07/24/20 0553  WBC 9.8 8.9 8.1 8.5  NEUTROABS 8.3*  --  6.1  --   HGB 15.6* 15.3* 14.8 15.0  HCT 50.0* 48.9* 46.5* 46.8*  MCV 95.6 95.7 93.0 91.8  PLT 385 371 340 345   CBG: Recent Labs  Lab 07/20/20 2257 07/21/20 0541 07/21/20 2318 07/22/20 0642  GLUCAP 105* 97 95 88   Sepsis Labs: Recent Labs  Lab 07/19/20 0944 07/19/20 1454 07/22/20 0625 07/24/20 0553  WBC 9.8 8.9 8.1 8.5    Microbiology Recent Results (from the past 240 hour(s))  Resp Panel by RT-PCR (Flu A&B, Covid) Nasopharyngeal Swab     Status: None   Collection Time: 07/19/20  3:16 PM   Specimen: Nasopharyngeal Swab; Nasopharyngeal(NP) swabs in vial transport medium  Result Value Ref Range Status   SARS Coronavirus 2 by RT PCR NEGATIVE NEGATIVE Final    Comment: (NOTE) SARS-CoV-2 target nucleic acids are NOT DETECTED.  The SARS-CoV-2 RNA is generally detectable in upper respiratory specimens during the acute phase of infection. The lowest concentration of SARS-CoV-2 viral copies this assay can detect is 138 copies/mL. A negative result does not preclude SARS-Cov-2 infection and should not be used as the sole basis for treatment or other patient management decisions. A negative result  may occur with  improper specimen collection/handling, submission of specimen other than nasopharyngeal swab, presence of viral mutation(s) within the areas targeted by this assay, and inadequate number of viral copies(<138 copies/mL). A negative result must be combined with clinical observations, patient history, and epidemiological information. The expected result is Negative.  Fact Sheet for Patients:  EntrepreneurPulse.com.au  Fact Sheet for Healthcare Providers:  IncredibleEmployment.be  This test is no t yet approved or cleared by the [place] FDA and  has been authorized for detection and/or diagnosis of SARS-CoV-2 by FDA under an Emergency Use Authorization (EUA). This EUA will remain  in effect (meaning this test can be used) for the duration of the COVID-19 declaration under Section 564(b)(1) of the Act, 21 U.S.C.section 360bbb-3(b)(1), unless the authorization is terminated  or revoked sooner.       Influenza A by PCR NEGATIVE NEGATIVE Final   Influenza B by PCR NEGATIVE NEGATIVE Final    Comment: (NOTE) The Xpert Xpress SARS-CoV-2/FLU/RSV plus assay is intended as an aid in the diagnosis of influenza from Nasopharyngeal swab specimens and should not be used as a sole basis for treatment. Nasal washings and aspirates are unacceptable for Xpert Xpress SARS-CoV-2/FLU/RSV testing.  Fact Sheet for Patients: EntrepreneurPulse.com.au  Fact Sheet for Healthcare Providers: IncredibleEmployment.be  This test is not yet approved or cleared by the [place] FDA and has been authorized for detection and/or diagnosis of  SARS-CoV-2 by FDA under an Emergency Use Authorization (EUA). This EUA will remain in effect (meaning this test can be used) for the duration of the COVID-19 declaration under Section 564(b)(1) of the Act, 21 U.S.C. section 360bbb-3(b)(1), unless the authorization is terminated  or revoked.  Performed at [facility], [address] [place], [place] [address]   Urine Culture     Status: None   Collection Time: 07/21/20  6:47 AM   Specimen: Urine, Random  Result Value Ref Range Status   Specimen Description   Final    URINE, RANDOM Performed at [facility], [address] [place], [place] [address]    Special Requests   Final    NONE Performed at [facility], [address] [place], [place] [address]    Culture   Final    NO GROWTH Performed at [facility], [place] [address], [place], [place] [address]    Report Status 07/23/2020 FINAL  Final    Procedures and diagnostic studies:  ECHOCARDIOGRAM LIMITED  Result Date: 07/24/2020    ECHOCARDIOGRAM LIMITED REPORT   Patient Name:   Dana Perez Date of Exam: 07/24/2020 Medical Rec #:  [id number]         Height:       65.0 in Accession #:    [id number]        Weight:       134.0 lb Date of Birth:  09/27/1940         BSA:          1.669 m Patient Age:    52 years          BP:           156/70 mmHg Patient Gender: F                 HR:           108 bpm. Exam Location:  [facility] Procedure: Limited Echo, Limited Color Doppler and Cardiac Doppler Indications:     I31.3 Pericardial Effusion  History:         Patient has prior history of Echocardiogram examinations, most                  recent 07/20/2020. Risk Factors:HCL, Dyslipidemia and                  Hypertension.  Sonographer:     Charmayne Sheer RDCS (AE) Referring Phys:  [id number] [facility] Aldrick Derrig Diagnosing Phys: Bartholome Bill MD  Sonographer Comments: Technically difficult study due to poor echo windows. IMPRESSIONS  1. Left ventricular ejection fraction, by estimation, is 50 to 55%. The left ventricle has low normal function. The left ventricle has no regional wall motion abnormalities. Left ventricular diastolic parameters were normal.  2. Right ventricular systolic function is normal. The right ventricular size is mildly enlarged.   3. Moderate pericardial effusion. There is no evidence of cardiac tamponade.  4. The mitral valve was not well visualized. Trivial mitral valve regurgitation.  5. The aortic valve was not well visualized. Aortic valve regurgitation is not visualized. FINDINGS  Left Ventricle: Left ventricular ejection fraction, by estimation, is 50 to 55%. The left ventricle has low normal function. The left ventricle has no regional wall motion abnormalities. The left ventricular internal cavity size was normal in size. There is no left ventricular hypertrophy. Left ventricular diastolic parameters were normal. Right Ventricle: The right ventricular size is mildly enlarged. No increase in  right ventricular wall thickness. Right ventricular systolic function is normal. Pericardium: A moderately sized pericardial effusion is present. There is no evidence of cardiac tamponade. Mitral Valve: The mitral valve was not well visualized. Trivial mitral valve regurgitation. Tricuspid Valve: The tricuspid valve is not well visualized. Tricuspid valve regurgitation is trivial. Aortic Valve: The aortic valve was not well visualized. Aortic valve regurgitation is not visualized. Pulmonic Valve: The pulmonic valve was not well visualized. LEFT VENTRICLE PLAX 2D LVIDd:         3.99 cm LVIDs:         3.26 cm LV PW:         0.96 cm LV IVS:        0.97 cm  Bartholome Bill MD Electronically signed by Bartholome Bill MD Signature Date/Time: 07/24/2020/3:50:25 PM    Final     LOS: 5 days   George Hugh  [facility]   Pager on [email or web address]. If 7PM-7AM, please contact night-coverage at [email or web address]   07/24/2020, 6:15 PM

## 2020-07-25 LAB — BASIC METABOLIC PANEL
Anion gap: 10 (ref 5–15)
BUN: 54 mg/dL — ABNORMAL HIGH (ref 8–23)
CO2: 26 mmol/L (ref 22–32)
Calcium: 9 mg/dL (ref 8.9–10.3)
Chloride: 107 mmol/L (ref 98–111)
Creatinine, Ser: 0.96 mg/dL (ref 0.44–1.00)
GFR, Estimated: >60 mL/min (ref >60)
Glucose, Bld: 111 mg/dL — ABNORMAL HIGH (ref 70–99)
Potassium: 4.2 mmol/L (ref 3.5–5.1)
Sodium: 143 mmol/L (ref 135–145)

## 2020-07-25 LAB — CBC
HCT: 47.2 % — ABNORMAL HIGH (ref 36.0–46.0)
Hemoglobin: 15.1 g/dL — ABNORMAL HIGH (ref 12.0–15.0)
MCH: 29.7 pg (ref 26.0–34.0)
MCHC: 32 g/dL (ref 30.0–36.0)
MCV: 92.7 fL (ref 80.0–100.0)
Platelets: 357 10*3/uL (ref 150–400)
RBC: 5.09 MIL/uL (ref 3.87–5.11)
RDW: 13.2 % (ref 11.5–15.5)
WBC: 9.6 10*3/uL (ref 4.0–10.5)
nRBC: 0 % (ref 0.0–0.2)

## 2020-07-25 LAB — PROCALCITONIN: Procalcitonin: <0.1 ng/mL

## 2020-07-25 LAB — LACTATE DEHYDROGENASE: LDH: 155 U/L (ref 98–192)

## 2020-07-25 LAB — FIBRIN DERIVATIVES D-DIMER (ARMC ONLY): Fibrin derivatives D-dimer (ARMC): 396.94 ng/mL (FEU) (ref 0.00–499.00)

## 2020-07-25 MED ORDER — BENZOCAINE 10 % MT GEL
Freq: Two times a day (BID) | OROMUCOSAL | Status: DC | PRN
  Filled 2020-07-25: qty 9

## 2020-07-25 MED ORDER — MAGIC MOUTHWASH W/LIDOCAINE: 5.0000 mL | Freq: Three times a day (TID) | ORAL | Status: DC | PRN

## 2020-07-25 NOTE — Progress Notes (Signed)
Mobility Specialist - Progress Note   07/25/20 1207  Mobility  Activity Dangled on edge of bed  Level of Assistance Minimal assist, patient does 75% or more  Mobility Response Tolerated fair  Mobility performed by Mobility specialist  $Mobility charge 1 Mobility    Pre-mobility: 102 HR, 90% SpO2 Post-mobility: 107 HR, 92% SpO2   Pt laying in bed w/ husband present in room upon arrival. Pt agreed to session. Pt able to get to EOB w/ min assist for LE support. While dangling EOB, pt c/o feeling "nauseated, lightheaded and dizzy". Pt requested to lay back in bed after dangling EOB for ~5 minutes. Deferred further mobility. Pt transitioned sit to supine SBA. Overall, pt tolerated session fair. Pt left laying in bed w/ husband present in room. All needs placed in reach.    Aniel Hubble Mobility Specialist  07/25/20, 12:17 PM

## 2020-07-25 NOTE — Progress Notes (Signed)
Nutrition Follow Up Note   DOCUMENTATION CODES:   Severe malnutrition in context of chronic illness  INTERVENTION:   Ensure Plus vanilla- Give 1 bottle, 5 times daily via tube, each supplement provides 350 kcal and 13 grams of protein- Flush with 32m of water before and after each feed  Pro-Source 4101mBID via tube, provides 40kcal and 11g of protein per serving   Regimen provides 1830kcal/day, 87g/day protein and 151012may free water   NUTRITION DIAGNOSIS:   Severe Malnutrition related to cancer and cancer related treatments, other (see comment) (COPD) as evidenced by moderate fat depletion, severe fat depletion, severe muscle depletion.  GOAL:   Patient will meet greater than or equal to 90% of their needs  -met with tube feeds   MONITOR:   Weight trends, Labs, TF tolerance, Skin, I & O's  ASSESSMENT:   79 17o. 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   Met with pt and pt's husband in room today. Pt is sitting up in bed, is more alert and is able to answer some questions today. Pt reports headache today along with some nausea. Pt is requesting for RN to give her some tylenol; pt has had Zofran. Pt reports that her tube feeds are going well. RD explained to pt and husband that because she had intolerance to the Osmolite in the past, that we would just stick with the Ensure. Pt is tolerating the protein modulars well so husband and pt agree to continue doing these once pt is discharged. Per chart, pt is down 4lbs since admit; this is likely r/t fluid changes.   Medications reviewed and include: azithromycin, questran, lovenox, lasix, remeron, prednisone   Labs reviewed: BUN 54(H)  Diet Order:   Diet Order            Diet NPO  time specified  Diet effective now                EDUCATION NEEDS:   Education needs have been addressed  Skin:  Skin Assessment: Reviewed RN Assessment  Last BM:  12/8- type 4  Height:   Ht Readings from Last 1 Encounters:  07/19/20 _0  (1.651 m)    Weight:   Wt Readings from Last 1 Encounters:  07/24/20 60.8 kg    Ideal Body Weight:  56.8 kg  BMI:  Body mass index is 22.31 kg/m.  Estimated Nutritional Needs:   Kcal:  1600-1800kcal/day  Protein:  80-90g/day  Fluid:  1.5L/day  CasKoleen Distance, RD, LDN Please refer to [facility]IAri[facility]r RD and/or RD on-call/weekend/after hours pager

## 2020-07-25 NOTE — Consult Note (Signed)
Pulmonary Medicine          Date: 07/25/2020,   MRN# [id number] Latitia Housewright [facility] [birth year]     AdmissionWeight: 62.6 kg                 CurrentWeight: 60.8 kg  Referring physician: Dr Kurtis Bushman    CHIEF COMPLAINT:   Increased O2 requirement with failure to wean.    HISTORY OF PRESENT ILLNESS   As per admitting H/P ADMIRE BUNNELL is a 79 y.o. 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  She had gone to the cancer center for her 63-month routine evaluation and to discuss imaging results.  Patient was noted to be hypoxic during the visit with pulse oximetry in the 70s and was placed on oxygen via nasal cannula.  She complains of shortness of breath and has a nonproductive cough but denies having any fever or chills.  She denies having any chest pain, no nausea, no vomiting, no changes in her bowel habits, no dizziness or lightheadedness. Labs show sodium 143, potassium 4.0, chloride 105, bicarb 25, glucose 115, BUN 48, creatinine 1.26, calcium 8.6, alkaline phosphatase 115, albumin 3.6, AST 31, ALT 19, total protein 7.3, BNP 521, troponin XX, white count 8.9, hemoglobin 15.3, hematocrit 48.9, RDW 13.9, platelet count 371.  Chest x-ray reviewed by me shows cardiomegaly with findings of CHF and small bilateral pleural effusions.  CT scan of chest and abdomen 07/06/20 showed multiple new bilateral pulmonary nodules, most consistent with new pulmonary metastatic disease.  No evidence of metastatic disease within the abdomen or pelvis.  Emphysema.  Pulmonary consult was placed due to failure to wean with increased O2 requirement.   Patient was mildly confused during my evaluation and seemed aggitated.  After reassuring her that she is ok and  calming her for few moments she seemed better and denied chest pain, dyspnea/SOB, LE pain, abd pain. She did say she was hungry and thirsty but uses PEG for nourishmen which again pointed towards some confusiont. Her oral mucosa is very dry with crusted lingual surface. She is on 6L/min [place] with spO2 >90%. Speech is chronically hindered due to right mandibulectomy with ongoing h/n CA.     PAST MEDICAL HISTORY   Past Medical History:  Diagnosis Date  . Acute hypoxemic respiratory failure ([place]) 11/15/2017  . Acute respiratory failure ([place]) 11/15/2017  . Adenocarcinoma of colon ([place])   . Benign neoplasm of ascending colon   . CAP (community acquired pneumonia) 11/15/2017  . Essential hypertension 06/13/2014  . Goals of care, counseling/discussion 07/14/2017  . Healthcare-associated pneumonia   . HLD (hyperlipidemia) 06/13/2014  . Hypercholesteremia   . Hyperlipidemia   . Melena   . Multiple thyroid nodules 01/22/2012  . Osteoporosis   . Polycythemia, secondary 12/26/2014  . Pure hypercholesterolemia 03/15/2015  . Squamous cell carcinoma of head and neck ([place])   . Thyroid nodule      SURGICAL HISTORY   Past Surgical History:  Procedure Laterality Date  . ABDOMINAL HYSTERECTOMY    . COLONOSCOPY WITH PROPOFOL N/A 07/02/2017   Procedure: COLONOSCOPY WITH PROPOFOL;  Surgeon: Lucilla Lame, MD;  Location: [place];  Service: Endoscopy;  Laterality: N/A;  . ESOPHAGOGASTRODUODENOSCOPY N/A 05/17/2019   Procedure: ESOPHAGOGASTRODUODENOSCOPY (EGD);  Surgeon: Sherri Sear  Reece Levy, MD;  Location: [place];  Service: Gastroenterology;  Laterality: N/A;  . ORIF ANKLE FRACTURE Left 08/08/2017   Procedure: OPEN REDUCTION INTERNAL FIXATION (ORIF) ANKLE FRACTURE;  Surgeon: Dereck Leep, MD;  Location: [facility];  Service: Orthopedics;  Laterality: Left;  . POLYPECTOMY N/A 07/02/2017   Procedure: POLYPECTOMY;  Surgeon: Lucilla Lame, MD;  Location: [place];  Service: Endoscopy;   Laterality: N/A;  . WRIST FRACTURE SURGERY  08/2009   badly broken     FAMILY HISTORY   Family History  Problem Relation Age of Onset  . Heart disease Mother   . Diabetes Father      SOCIAL HISTORY   Social History   Tobacco Use  . Smoking status: Former Smoker    Packs/day: 0.25    Types: Cigarettes    Quit date: 06/17/2017    Years since quitting: 3.1  . Smokeless tobacco: Never Used  Vaping Use  . Vaping Use: Never used  Substance Use Topics  . Alcohol use: No  . Drug use: No     MEDICATIONS    Home Medication:    Current Medication:  Current Facility-Administered Medications:  .  0.9 %  sodium chloride infusion, 250 mL, Intravenous, PRN, Agbata, Tochukwu, MD, Last Rate: 10 mL/hr at 07/23/20 2059, 250 mL at 07/23/20 2059 .  acetaminophen (TYLENOL) tablet 650 mg, 650 mg, Oral, Q6H PRN, Agbata, Tochukwu, MD, 650 mg at 07/25/20 1215 .  albuterol (VENTOLIN HFA) 108 (90 Base) MCG/ACT inhaler 1-2 puff, 1-2 puff, Inhalation, Q4H PRN, George Hugh, MD .  azithromycin (ZITHROMAX) tablet 500 mg, 500 mg, Oral, Daily, Masoud, Jarrett Soho, MD, 500 mg at 07/25/20 0823 .  bacitracin ointment 1 application, 1 application, Topical, Tonye Pearson, MD, 1 application at 51/88/41 1032 .  benzocaine (ORAJEL) 10 % mucosal gel, , Mouth/Throat, BID PRN, Nolberto Hanlon, MD .  budesonide (PULMICORT) nebulizer solution 0.25 mg, 0.25 mg, Nebulization, BID, George Hugh, MD, 0.25 mg at 07/25/20 0742 .  chlorpheniramine-HYDROcodone (TUSSIONEX) 10-8 MG/5ML suspension 5 mL, 5 mL, Per Tube, Q12H PRN, Agbata, Tochukwu, MD .  cholestyramine (QUESTRAN) packet 4 g, 4 g, Per Tube, TID WC, Jennye Boroughs, MD, 4 g at 07/25/20 1623 .  clotrimazole (LOTRIMIN) 1 % cream, , Topical, Tonye Pearson, MD, Given at 07/24/20 1032 .  enoxaparin (LOVENOX) injection 40 mg, 40 mg, Subcutaneous, Q24H, Agbata, Tochukwu, MD, 40 mg at 07/24/20 2026 .  feeding supplement (ENSURE ENLIVE / ENSURE PLUS) liquid  237 mL, 237 mL, Per Tube, 5 X Daily, Jennye Boroughs, MD, 237 mL at 07/25/20 1622 .  feeding supplement (PROSource TF) liquid 45 mL, 45 mL, Per Tube, BID, Jennye Boroughs, MD, 45 mL at 07/25/20 0820 .  fentaNYL (DURAGESIC) 50 MCG/HR 1 patch, 1 patch, Transdermal, Q72H, Jennye Boroughs, MD, 1 patch at 07/24/20 1042 .  free water 100 mL, 100 mL, Per Tube, 5 X Daily, Jennye Boroughs, MD, 100 mL at 07/25/20 1623 .  furosemide (LASIX) injection 20 mg, 20 mg, Intravenous, Daily, White, Delicia, NP, 20 mg at 66/06/30 1601 .  hydrOXYzine (ATARAX/VISTARIL) tablet 10 mg, 10 mg, Per Tube, BID PRN, Agbata, Tochukwu, MD .  ipratropium-albuterol (DUONEB) 0.5-2.5 (3) MG/3ML nebulizer solution 3 mL, 3 mL, Nebulization, Q4H while awake, George Hugh, MD, 3 mL at 07/25/20 1602 .  mirtazapine (REMERON) tablet 15 mg, 15 mg, Oral, QHS, Agbata, Tochukwu, MD, 15 mg at 07/24/20 2024 .  morphine CONCENTRATE 10 MG/0.5ML oral solution 5 mg, 5 mg, Per Tube, Q4H  PRN, Collier Bullock, MD, 5 mg at 07/25/20 1622 .  ondansetron (ZOFRAN) injection 4 mg, 4 mg, Intravenous, Q6H PRN, Agbata, Tochukwu, MD, 4 mg at 07/25/20 1622 .  pravastatin (PRAVACHOL) tablet 10 mg, 10 mg, Per Tube, QHS, Agbata, Tochukwu, MD, 10 mg at 07/24/20 2024 .  predniSONE (DELTASONE) tablet 40 mg, 40 mg, Oral, Q breakfast, Masoud, Jarrett Soho, MD, 40 mg at 07/25/20 0240 .  sodium chloride flush (NS) 0.9 % injection 3 mL, 3 mL, Intravenous, Q12H, Agbata, Tochukwu, MD, 3 mL at 07/25/20 0831 .  sodium chloride flush (NS) 0.9 % injection 3 mL, 3 mL, Intravenous, PRN, Agbata, Tochukwu, MD  Facility-Administered Medications Ordered in Other Encounters:  .  heparin lock flush 100 unit/mL, 250 Units, Intracatheter, PRN, Ma Hillock, Sandeep, MD .  heparin lock flush 100 unit/mL, 500 Units, Intracatheter, PRN, Ma Hillock, Sandeep, MD .  sodium chloride 0.9 % injection 10 mL, 10 mL, Intracatheter, PRN, Ma Hillock, Sandeep, MD .  sodium chloride 0.9 % injection 10 mL, 10 mL,  Intracatheter, PRN, Ma Hillock, Sandeep, MD .  sodium chloride 0.9 % injection 10 mL, 10 mL, Intracatheter, PRN, Ma Hillock, Sandeep, MD .  sodium chloride 0.9 % injection 10 mL, 10 mL, Intracatheter, PRN, Leia Alf, MD    ALLERGIES   Buprenorphine, Other, and Tetanus antitoxin     REVIEW OF SYSTEMS    Review of Systems:  Gen:  Denies  fever, sweats, chills weigh loss  HEENT: Denies blurred vision, double vision, ear pain, eye pain, hearing loss, nose bleeds, sore throat Cardiac:  No dizziness, chest pain or heaviness, chest tightness,edema Resp:   Denies cough or sputum porduction, shortness of breath,wheezing, hemoptysis,  Gi: Denies swallowing difficulty, stomach pain, nausea or vomiting, diarrhea, constipation, bowel incontinence Gu:  Denies bladder incontinence, burning urine Ext:   Denies Joint pain, stiffness or swelling Skin: Denies  skin rash, easy bruising or bleeding or hives Endoc:  Denies polyuria, polydipsia , polyphagia or weight change Psych:   Denies depression, insomnia or hallucinations   Other:  All other systems negative   VS: BP 136/78 (BP Location: Right Arm)   Pulse 97   Temp 97.7 F (36.5 C) (Oral)   Resp 19   Ht 5\' 5"  (1.651 m)   Wt 60.8 kg   SpO2 92%   BMI 22.31 kg/m      PHYSICAL EXAM    GENERAL:NAD, no fevers, chills, no weakness no fatigue HEAD: Normocephalic, atraumatic.  EYES: Pupils equal, round, reactive to light. Extraocular muscles intact. No scleral icterus.  MOUTH: Dry mucosal membrane. Crusted lingual surface. Dentition intact. No abscess noted.  EAR, NOSE, THROAT: Clear without exudates. No external lesions. S/p r mandibular surgery NECK: Supple. No thyromegaly. No nodules. No JVD.  PULMONARY:decreased breath sounds with mild rhonchi CARDIOVASCULAR: S1 and S2. Regular rate and rhythm. No murmurs, rubs, or gallops. No edema. Pedal pulses 2+ bilaterally.  GASTROINTESTINAL: Soft, nontender, nondistended. No masses. Positive  bowel sounds. No hepatosplenomegaly. +peg status MUSCULOSKELETAL: No swelling, clubbing, or edema. Range of motion full in all extremities.  NEUROLOGIC: Cranial nerves II through XII are intact. No gross focal neurological deficits. Sensation intact.  SKIN: No ulceration, lesions, rashes, or cyanosis. Skin warm and dry. Turgor intact.  PSYCHIATRIC: patient seems with mild cognitive impairment      IMAGING    CT SOFT TISSUE NECK W CONTRAST  Result Date: 06/26/2020 CLINICAL DATA:  Follow-up head and neck cancer. Squamous cell carcinoma. Surveillance imaging. EXAM: CT NECK WITH CONTRAST TECHNIQUE: Multidetector CT imaging  of the neck was performed using the standard protocol following the bolus administration of intravenous contrast. Not utilized. Stenosis is probably in the range of 50% on both sides. Diminutive right jugular vein. CONTRAST:  151mL OMNIPAQUE IOHEXOL 300 MG/ML  SOLN COMPARISON:  12/07/2019.  06/07/2019.  05/08/2017. FINDINGS: Pharynx and larynx: No mucosal or submucosal lesion. Salivary glands: Left submandibular gland is diminutive but normal. Right submandibular gland surgically absent. Parotid glands are normal. Thyroid: Normal Lymph nodes: No residual or recurrent lymphadenopathy. Previous right mandibular resection with regional flap reconstruction. Similar appearance of the soft tissues of the region without evidence of recurrent mass. Vascular: Bilateral carotid atherosclerotic disease at the bifurcations. CT angiographic technique was not utilized. Stenosis estimated at approximately 50% on both sides. Diminutive right jugular vein. Limited intracranial: Negative Visualized orbits: Negative Mastoids and visualized paranasal sinuses: Clear Skeleton: Ordinary cervical spondylosis. Right mandibular resection as noted above. No malignant bone findings. Upper chest: See results of chest CT. 1.5 cm density right upper lobe. Other: None IMPRESSION: 1. Previous right mandibular resection  with regional flap reconstruction. Similar appearance of the soft tissues of the region without evidence of recurrent mass. No residual or recurrent lymphadenopathy. 2. Bilateral carotid atherosclerotic disease. Stenosis estimated at approximately 50% on both sides. CT angiographic technique was not utilized. 3. 1.5 cm density right upper lobe.  See results of chest CT. Electronically Signed   By: Nelson Chimes M.D.   On: 06/26/2020 11:20   CT CHEST ABDOMEN PELVIS W CONTRAST  Result Date: 06/26/2020 CLINICAL DATA:  Metastatic head and neck cancer, history of colon cancer EXAM: CT CHEST, ABDOMEN, AND PELVIS WITH CONTRAST TECHNIQUE: Multidetector CT imaging of the chest, abdomen and pelvis was performed following the standard protocol during bolus administration of intravenous contrast. CONTRAST:  113mL OMNIPAQUE IOHEXOL 300 MG/ML SOLN, additional oral enteric contrast COMPARISON:  12/07/2019 FINDINGS: CT CHEST FINDINGS Cardiovascular: Aortic atherosclerosis. Normal heart size. Left and right coronary artery calcifications. Small pericardial effusion, unchanged. Mediastinum/Nodes: No enlarged mediastinal, hilar, or axillary lymph nodes. Thyroid gland, trachea, and esophagus demonstrate no significant findings. Lungs/Pleura: Severe centrilobular emphysema. There are multiple new bilateral pulmonary nodules, in the peripheral right apex measuring 1.5 x 1.4 cm (series 8, image 24), in the anterior right middle lobe measuring 1.8 x 1.2 cm (series 8, image 84) and in the superior segment left lower lobe measuring 2.2 x 1.7 cm (series 8, image 82). No pleural effusion or pneumothorax. Musculoskeletal: No chest wall mass or suspicious bone lesions identified. CT ABDOMEN PELVIS FINDINGS Hepatobiliary: No solid liver abnormality is seen. No gallstones, gallbladder wall thickening, or biliary dilatation. Pancreas: Unremarkable. No pancreatic ductal dilatation or surrounding inflammatory changes. Spleen: Normal in size  without significant abnormality. Adrenals/Urinary Tract: Adrenal glands are unremarkable. Kidneys are normal, without renal calculi, solid lesion, or hydronephrosis. Bladder is unremarkable. Stomach/Bowel: Percutaneous gastrostomy. Stomach is otherwise within normal limits. Appendix is not clearly visualized. No evidence of bowel wall thickening, distention, or inflammatory changes. Vascular/Lymphatic: No significant vascular findings are present. No enlarged abdominal or pelvic lymph nodes. Reproductive: Status post hysterectomy. Other: No abdominal wall hernia or abnormality. No abdominopelvic ascites. Musculoskeletal: No acute or significant osseous findings. IMPRESSION: 1. There are multiple new bilateral pulmonary nodules, most consistent with new pulmonary metastatic disease although conceivably infectious or inflammatory in nature. Consider tissue sampling. 2. No evidence of metastatic disease within the abdomen or pelvis. 3. Emphysema (ICD10-J43.9). 4. Small, unchanged pericardial effusion. 5. Coronary artery disease.  Aortic Atherosclerosis (ICD10-I70.0). Electronically Signed  By: Eddie Candle M.D.   On: 06/26/2020 15:57   DG Chest Port 1 View  Result Date: 07/19/2020 CLINICAL DATA:  79 year old female with shortness of breath. EXAM: PORTABLE CHEST 1 VIEW COMPARISON:  Chest radiograph dated 11/15/2017. FINDINGS: There is cardiomegaly with vascular congestion and edema. Small bilateral pleural effusions, right greater left, with bibasilar atelectasis. Pneumonia is not excluded clinical correlation is recommended. No pneumothorax. Atherosclerotic calcification of the aorta. No acute osseous pathology. IMPRESSION: Cardiomegaly with findings of CHF and small bilateral pleural effusions. Pneumonia is not excluded. Electronically Signed   By: Anner Crete M.D.   On: 07/19/2020 15:32   ECHOCARDIOGRAM COMPLETE  Result Date: 07/20/2020    ECHOCARDIOGRAM REPORT   Patient Name:   Dana Perez Date  of Exam: 07/20/2020 Medical Rec #:  [id number]         Height:       65.0 in Accession #:    [id number]        Weight:       138.0 lb Date of Birth:  Sep 28, 1940         BSA:          1.690 m Patient Age:    61 years          BP:           157/76 mmHg Patient Gender: F                 HR:           96 bpm. Exam Location:  [facility] Procedure: 2D Echo, Cardiac Doppler and Color Doppler Indications:     CHF-acute diastolic 637.85  History:         Patient has no prior history of Echocardiogram examinations.                  Risk Factors:Dyslipidemia.  Sonographer:     Sherrie Sport RDCS (AE) Referring Phys:  [id number] Royce Macadamia AGBATA Diagnosing Phys: Yolonda Kida MD  Sonographer Comments: Technically challenging study due to limited acoustic windows, no apical window and no subcostal window. IMPRESSIONS  1. Left ventricular ejection fraction, by estimation, is 65 to 70%. The left ventricle has normal function. The left ventricle has no regional wall motion abnormalities. There is mild left ventricular hypertrophy. Left ventricular diastolic parameters are consistent with Grade I diastolic dysfunction (impaired relaxation).  2. Right ventricular systolic function is mildly reduced. The right ventricular size is moderately enlarged.  3. Moderate pericardial effusion.  4. The mitral valve is grossly normal. Mild mitral valve regurgitation.  5. The aortic valve is normal in structure. Aortic valve regurgitation is trivial. FINDINGS  Left Ventricle: Left ventricular ejection fraction, by estimation, is 65 to 70%. The left ventricle has normal function. The left ventricle has no regional wall motion abnormalities. The left ventricular internal cavity size was normal in size. There is  mild left ventricular hypertrophy. Left ventricular diastolic parameters are consistent with Grade I diastolic dysfunction (impaired relaxation). Right Ventricle: The right ventricular size is moderately enlarged. No increase in right ventricular wall  thickness. Right ventricular systolic function is mildly reduced. Left Atrium: Left atrial size was normal in size. Right Atrium: Right atrial size was normal in size. Pericardium: A moderately sized pericardial effusion is present. Mitral Valve: The mitral valve is grossly normal. Mild mitral valve regurgitation. Tricuspid Valve: The tricuspid valve is grossly normal. Tricuspid valve regurgitation is mild. Aortic Valve: The aortic valve is normal in structure. Aortic  valve regurgitation is trivial. Pulmonic Valve: The pulmonic valve was grossly normal. Pulmonic valve regurgitation is not visualized. Aorta: The aortic arch was not well visualized. IAS/Shunts: No atrial level shunt detected by color flow Doppler.  LEFT VENTRICLE PLAX 2D LVIDd:         3.82 cm LVIDs:         2.48 cm LV PW:         0.92 cm LV IVS:        1.11 cm LVOT diam:     2.00 cm LVOT Area:     3.14 cm  LEFT ATRIUM         Index LA diam:    2.30 cm 1.36 cm/m                        PULMONIC VALVE AORTA                 PV Vmax:        0.67 m/s Ao Root diam: 2.30 cm PV Peak grad:   1.8 mmHg                       RVOT Peak grad: 2 mmHg  TRICUSPID VALVE TR Peak grad:   23.6 mmHg TR Vmax:        243.00 cm/s  SHUNTS Systemic Diam: 2.00 cm Yolonda Kida MD Electronically signed by Yolonda Kida MD Signature Date/Time: 07/20/2020/3:01:26 PM    Final    ECHOCARDIOGRAM LIMITED  Result Date: 07/24/2020    ECHOCARDIOGRAM LIMITED REPORT   Patient Name:   Dana Perez Date of Exam: 07/24/2020 Medical Rec #:  [id number]         Height:       65.0 in Accession #:    [id number]        Weight:       134.0 lb Date of Birth:  01/23/1941         BSA:          1.669 m Patient Age:    79 years          BP:           156/70 mmHg Patient Gender: F                 HR:           108 bpm. Exam Location:  [facility] Procedure: Limited Echo, Limited Color Doppler and Cardiac Doppler Indications:     I31.3 Pericardial Effusion  History:         Patient has prior  history of Echocardiogram examinations, most                  recent 07/20/2020. Risk Factors:HCL, Dyslipidemia and                  Hypertension.  Sonographer:     Charmayne Sheer RDCS (AE) Referring Phys:  [id number] [facility] MASOUD Diagnosing Phys: Bartholome Bill MD  Sonographer Comments: Technically difficult study due to poor echo windows. IMPRESSIONS  1. Left ventricular ejection fraction, by estimation, is 50 to 55%. The left ventricle has low normal function. The left ventricle has no regional wall motion abnormalities. Left ventricular diastolic parameters were normal.  2. Right ventricular systolic function is normal. The right ventricular size is mildly enlarged.  3. Moderate pericardial effusion. There is no evidence of cardiac tamponade.  4. The mitral valve was  not well visualized. Trivial mitral valve regurgitation.  5. The aortic valve was not well visualized. Aortic valve regurgitation is not visualized. FINDINGS  Left Ventricle: Left ventricular ejection fraction, by estimation, is 50 to 55%. The left ventricle has low normal function. The left ventricle has no regional wall motion abnormalities. The left ventricular internal cavity size was normal in size. There is no left ventricular hypertrophy. Left ventricular diastolic parameters were normal. Right Ventricle: The right ventricular size is mildly enlarged. No increase in right ventricular wall thickness. Right ventricular systolic function is normal. Pericardium: A moderately sized pericardial effusion is present. There is no evidence of cardiac tamponade. Mitral Valve: The mitral valve was not well visualized. Trivial mitral valve regurgitation. Tricuspid Valve: The tricuspid valve is not well visualized. Tricuspid valve regurgitation is trivial. Aortic Valve: The aortic valve was not well visualized. Aortic valve regurgitation is not visualized. Pulmonic Valve: The pulmonic valve was not well visualized. LEFT VENTRICLE PLAX 2D LVIDd:         3.99 cm  LVIDs:         3.26 cm LV PW:         0.96 cm LV IVS:        0.97 cm  Bartholome Bill MD Electronically signed by Bartholome Bill MD Signature Date/Time: 07/24/2020/3:50:25 PM    Final       ASSESSMENT/PLAN   Acute hypoxemic respiratory failure  - due to likely metastatic lung cancer with concomitant chronic bullous centrilobular emphysema and cylindrical bronchiectasis.  -There are no signs of pulmonary edema patient appears dry with crusted oral mucosa and with pre-rena azotemia. I will d/c lasix today.  -patient with moderate pericardial effusion - unclear if malignant, TTE with normal systolic and diastolic function - May need tissue diagnosis to diagnose metastatic lesions per radiology report -PEG status with high risk for aspiration  -Relative immunosuppresion with potential for oppportunistic infections -Fungitell serum  - Histoplasma urine -Aspergillus serum  -MRSA PCR -sputum respiratory culture -sputum cytology -patient is full code recommend goals of care discussion  -d-dimer to r/o PE since right heart is enlarged on repeat echo although this may be due to pulmonary hypertension as evidenced by generous pulmonary vasculature on ct with contrast -patient with some confusion may be due to brain metastatic implants , will defer to oncology for MRI brain.  - patient may benefit from palliative care evaluation -chest PT with respiratory therapist.    Altered mental status with confusion    - most recent MRI brain 2019 - no mets    - patient on high dose narcotics, vistaril, prednisone which may be hindering sensorium    - patient may have mild cognitive impairment with age related dementia and sundowning.   -will defer neuroimaging to oncology    Thank you for allowing me to participate in the care of this patient.   Patient/Family are satisfied with care plan and all questions have been answered.  This document was prepared using Dragon voice recognition software and may  include unintentional dictation errors.     Ottie Glazier, M.D.  Division of [place]

## 2020-07-25 NOTE — Progress Notes (Signed)
[facility] Cardiology  Patient Description: Dana Perez is a 79 y/o female with PMH significant for HTN,  CKD Stage III, COPD, progressive stage Iva head and neck squamous cell carcinoma and adenocarcinoma of the colon who was admitted for acute respiratory failure. Cardiology consulted for possible acute CHF.   SUBJECTIVE: The patient reports to be doing "alright" on today but she c/o abdominal pain. The patient denies having any chest pain and states that her breathing has much improved.  07/24/20: Today the patient reports that she is not feeling well. She denies having any chest pain or dyspnea. Her husband is at the bedside and reports that the patient states "she said that she is not in pain, she just does not feel well."   12/6//21: The patient reports to be doing okay and she denies having any chest pain or worsening dyspnea at this time. The husband is at the bedside and reports that the patient has had good output and has not complained of any pain or edema.   OBJECTIVE: The patient was more communicative on today and appears slightly better compared to yesterday. Her bedside ECG reveals ST likely due to her recent nebulizer treatment and she remains asymptomatic. She appears euvolemic and her recent echocardiogram continues to reveal moderate pericardial effusion without evidence of tamponade.  Her oxygen has been weaned to 6L via [place] and she her oxygen saturation is at 89%.   07/25/20: The patient is resting in bed and is less communicative on today. Her bedside ECG reveals NSR with a HR of 93 bpm and she appears to be euvolemic.   07/23/20: The patient did not speak much, appears chronically ill and is no apparent distress. She is resting comfortably in bed, on supplemental oxygen with normal effort of breathing. She is in NSR with HR of 97 bpm on the bedside monitor and she appears euvolemic.   Vitals:   07/25/20 0315 07/25/20 0320 07/25/20 0345 07/25/20 0820  BP:   131/69 123/68  Pulse: 95 90  98 (!) 105  Resp: 20 13 (!) 23 17  Temp:   98 F (36.7 C) 97.7 F (36.5 C)  TempSrc:   Oral Oral  SpO2: 93% 94% 94% 91%  Weight:      Height:         Intake/Output Summary (Last 24 hours) at 07/25/2020 1006 Last data filed at 07/24/2020 1628 Gross per 24 hour  Intake 1850 ml  Output 450 ml  Net 1400 ml      PHYSICAL EXAM  General: appears chronically ill,  in no acute distress HEENT:  Normocephalic and atraumatic.  Neck:  No JVD. Negative for rigidity.  Lungs: Clear bilaterally to upper lobes, diminished in lower lobes upon auscultation. Chest expansion appears symmetrical. Normal effort of breathing.  Heart: HRRR . Normal S1 and S2 without gallops or murmurs.  Abdomen: Bowel sounds are positive, abdomen soft and non-tender  Msk: Decreased strength and tone for age. Extremities: No clubbing, cyanosis or edema.  +2 peripheral pulses.  Neuro: Alert and oriented X 2. Psych:  flat affect, responds appropriately   LABS: Basic Metabolic Panel: Recent Labs    07/23/20 0508 07/23/20 0508 07/24/20 0553 07/25/20 0454  NA 140   < > 139 143  K 3.3*   < > 3.5 4.2  CL 103   < > 102 107  CO2 26   < > 26 26  GLUCOSE 106*   < > 107* 111*  BUN 53*   < >  56* 54*  CREATININE 1.20*   < > 1.01* 0.96  CALCIUM 9.1   < > 9.2 9.0  MG 2.5*  --   --   --    < > = values in this interval not displayed.   Liver Function Tests: No results for input(s): AST, ALT, ALKPHOS, BILITOT, PROT, ALBUMIN in the last 72 hours. No results for input(s): LIPASE, AMYLASE in the last 72 hours. CBC: Recent Labs    07/24/20 0553 07/25/20 0454  WBC 8.5 9.6  HGB 15.0 15.1*  HCT 46.8* 47.2*  MCV 91.8 92.7  PLT 345 357   Cardiac Enzymes: No results for input(s): CKTOTAL, CKMB, CKMBINDEX, TROPONINI in the last 72 hours. BNP: Invalid input(s): POCBNP D-Dimer: No results for input(s): DDIMER in the last 72 hours. Hemoglobin A1C: No results for input(s): HGBA1C in the last 72 hours. Fasting Lipid  Panel: No results for input(s): CHOL, HDL, LDLCALC, TRIG, CHOLHDL, LDLDIRECT in the last 72 hours. Thyroid Function Tests: No results for input(s): TSH, T4TOTAL, T3FREE, THYROIDAB in the last 72 hours.  Invalid input(s): FREET3 Anemia Panel: No results for input(s): VITAMINB12, FOLATE, FERRITIN, TIBC, IRON, RETICCTPCT in the last 72 hours.  ECHOCARDIOGRAM LIMITED  Result Date: 07/24/2020    ECHOCARDIOGRAM LIMITED REPORT   Patient Name:   Dana Perez Date of Exam: 07/24/2020 Medical Rec #:  [id number]         Height:       65.0 in Accession #:    [id number]        Weight:       134.0 lb Date of Birth:  [birth year]         BSA:          1.669 m Patient Age:    45 years          BP:           156/70 mmHg Patient Gender: F                 HR:           108 bpm. Exam Location:  [facility] Procedure: Limited Echo, Limited Color Doppler and Cardiac Doppler Indications:     I31.3 Pericardial Effusion  History:         Patient has prior history of Echocardiogram examinations, most                  recent 07/20/2020. Risk Factors:HCL, Dyslipidemia and                  Hypertension.  Sonographer:     Charmayne Sheer RDCS (AE) Referring Phys:  [id number] [facility] Diagnosing Phys: Bartholome Bill MD  Sonographer Comments: Technically difficult study due to poor echo windows. IMPRESSIONS  1. Left ventricular ejection fraction, by estimation, is 50 to 55%. The left ventricle has low normal function. The left ventricle has no regional wall motion abnormalities. Left ventricular diastolic parameters were normal.  2. Right ventricular systolic function is normal. The right ventricular size is mildly enlarged.  3. Moderate pericardial effusion. There is no evidence of cardiac tamponade.  4. The mitral valve was not well visualized. Trivial mitral valve regurgitation.  5. The aortic valve was not well visualized. Aortic valve regurgitation is not visualized. FINDINGS  Left Ventricle: Left ventricular ejection fraction, by estimation,  is 50 to 55%. The left ventricle has low normal function. The left ventricle has no regional wall motion abnormalities. The left ventricular internal cavity size was  normal in size. There is no left ventricular hypertrophy. Left ventricular diastolic parameters were normal. Right Ventricle: The right ventricular size is mildly enlarged. No increase in right ventricular wall thickness. Right ventricular systolic function is normal. Pericardium: A moderately sized pericardial effusion is present. There is no evidence of cardiac tamponade. Mitral Valve: The mitral valve was not well visualized. Trivial mitral valve regurgitation. Tricuspid Valve: The tricuspid valve is not well visualized. Tricuspid valve regurgitation is trivial. Aortic Valve: The aortic valve was not well visualized. Aortic valve regurgitation is not visualized. Pulmonic Valve: The pulmonic valve was not well visualized. LEFT VENTRICLE PLAX 2D LVIDd:         3.99 cm LVIDs:         3.26 cm LV PW:         0.96 cm LV IVS:        0.97 cm  Bartholome Bill MD Electronically signed by Bartholome Bill MD Signature Date/Time: 07/24/2020/3:50:25 PM    Final      Echo: IMPRESSIONS  1. Left ventricular ejection fraction, by estimation, is 50 to 55%. The  left ventricle has low normal function. The left ventricle has no regional  wall motion abnormalities. Left ventricular diastolic parameters were  normal.  2. Right ventricular systolic function is normal. The right ventricular  size is mildly enlarged.  3. Moderate pericardial effusion. There is no evidence of cardiac  tamponade.  4. The mitral valve was not well visualized. Trivial mitral valve  regurgitation.  5. The aortic valve was not well visualized. Aortic valve regurgitation  is not visualized.   TELEMETRY: ST with HR of 107 bpm  ASSESSMENT AND PLAN:  Principal Problem:   Acute respiratory failure (HCC) Active Problems:   Essential hypertension   Squamous cell carcinoma of head  and neck (HCC)   CKD (chronic kidney disease) stage 3, GFR 30-59 ml/min   Acute CHF (congestive heart failure) (HCC)   COPD (chronic obstructive pulmonary disease) (HCC)   Acute lower UTI    1. Acute CHF, stable, grade I diastolic dysfunction, patient appears euvolemic  -Recommend decreasing IV lasix to 20mg  once daily.   - Invasive Cardiac studies are not recommended at this time.  -Monitor strict I&O's, follow low sodium diet and perform daily weights.   -Monitor renal functioning.   -Continuous telemetry monitoring.   2. Moderate pericardial effusion, stable  -No evidence of tamponade.   -We will continue conservative management.   3. Acute respiratory failure, fairly stable  -Agree with current management and supplemental oxygen.   -RT consult appreciated.   4. HTN, controlled, patient is normotensive  -Recommend contiuing to hold antihypertensive medications at this time in the presence of lower blood pressures.   5. CKD stage III, fairly stable  -agree with current therapy.  - recommend decreasing lasix.   6. Squamous cell carcinoma of head and neck  -Management per Oncology appreciated.     Ahren Pettinger, ACNPC-AG  07/25/2020 10:06 AM

## 2020-07-25 NOTE — Progress Notes (Signed)
PROGRESS NOTE    Dana Perez  ZMO:[id number] DOB: 1940/12/16 DOA: 07/19/2020 PCP: [name], MD    Brief Narrative:  Dana Perez is a 79 y.o. female with medical history of Chronic Tobacco Use and COPD, Stage 4 squamous cell carcinoma of the head and neck s/p 2019 right segmental mandibulectomy and pectoralis flap as well as CRT and immunotherapy, adenocarcinoma of the colon, Stage 3 CKD, osteoporosis and history of fractures, and severe protein calorie malnutrition s/p 2019 PEG tube placement who presented to the ED with acute hypoxia requiring 15L [place] found to have new lung metastases.   12/8-per nsg, pt's mouth has ulcers. Have asked RPH to help with giving medication to help with this  Consultants:   Pccm, cardiology, oncology  Procedures:   Antimicrobials:       Subjective: Husband at bedside.  Patient only moans yes no to my questions.Per husband pt was c/o neck pain due to the positioning of her lying in bed. Denies sob? But husband states she always says she is fine, but then complains her symptoms to him afterwards.Not too communicative with me.  Objective: Vitals:   07/25/20 0345 07/25/20 0820 07/25/20 1100 07/25/20 1602  BP: 131/69 123/68 129/70   Pulse: 98 (!) 105 (!) 102   Resp: (!) 23 17 (!) 22   Temp: 98 F (36.7 C) 97.7 F (36.5 C) 97.9 F (36.6 C)   TempSrc: Oral Oral Axillary   SpO2: 94% 91% 91% 94%  Weight:      Height:        Intake/Output Summary (Last 24 hours) at 07/25/2020 1710 Last data filed at 07/25/2020 1121 Gross per 24 hour  Intake --  Output 600 ml  Net -600 ml   Filed Weights   07/22/20 0311 07/23/20 0401 07/24/20 0439  Weight: 61.4 kg 59.8 kg 60.8 kg    Examination:  General exam: Appears calm , not too communicative, chronic ill looking Respiratory system: poor respiratory effort, decrease bs b/l Cardiovascular system: S1 & S2 heard, RRR. No JVD, murmurs, rubs, gallops or clicks.  Gastrointestinal system:  Abdomen is nondistended, soft and nontender. . Normal bowel sounds heard. Central nervous system: unable to assess. Eyes open, mouth droop (per husband not new) Extremities: no edema Psychiatry: flat affect    Data Reviewed: I have personally reviewed following labs and imaging studies  CBC: Recent Labs  Lab 07/19/20 0944 07/19/20 1454 07/22/20 0625 07/24/20 0553 07/25/20 0454  WBC 9.8 8.9 8.1 8.5 9.6  NEUTROABS 8.3*  --  6.1  --   --   HGB 15.6* 15.3* 14.8 15.0 15.1*  HCT 50.0* 48.9* 46.5* 46.8* 47.2*  MCV 95.6 95.7 93.0 91.8 92.7  PLT 385 371 340 345 035   Basic Metabolic Panel: Recent Labs  Lab 07/20/20 0447 07/20/20 0447 07/21/20 0619 07/22/20 0625 07/23/20 0508 07/24/20 0553 07/25/20 0454  NA 146*   < > 141 139 140 139 143  K 3.6   < > 3.5 3.5 3.3* 3.5 4.2  CL 105   < > 103 102 103 102 107  CO2 28   < > 26 24 26 26 26   GLUCOSE 94   < > 92 85 106* 107* 111*  BUN 42*   < > 41* 44* 53* 56* 54*  CREATININE 1.24*   < > 1.08* 0.96 1.20* 1.01* 0.96  CALCIUM 8.2*   < > 8.4* 8.7* 9.1 9.2 9.0  MG 2.7*  --  2.6* 2.4 2.5*  --   --    < > =  values in this interval not displayed.   GFR: Estimated Creatinine Clearance: 44.2 mL/min (by C-G formula based on SCr of 0.96 mg/dL). Liver Function Tests: Recent Labs  Lab 07/19/20 0944  AST 31  ALT 19  ALKPHOS 115  BILITOT 0.5  PROT 7.3  ALBUMIN 3.6   No results for input(s): LIPASE, AMYLASE in the last 168 hours. No results for input(s): AMMONIA in the last 168 hours. Coagulation Profile: No results for input(s): INR, PROTIME in the last 168 hours. Cardiac Enzymes: No results for input(s): CKTOTAL, CKMB, CKMBINDEX, TROPONINI in the last 168 hours. BNP (last 3 results) No results for input(s): PROBNP in the last 8760 hours. HbA1C: No results for input(s): HGBA1C in the last 72 hours. CBG: Recent Labs  Lab 07/20/20 2257 07/21/20 0541 07/21/20 2318 07/22/20 0642  GLUCAP 105* 97 95 88   Lipid Profile: No  results for input(s): CHOL, HDL, LDLCALC, TRIG, CHOLHDL, LDLDIRECT in the last 72 hours. Thyroid Function Tests: No results for input(s): TSH, T4TOTAL, FREET4, T3FREE, THYROIDAB in the last 72 hours. Anemia Panel: No results for input(s): VITAMINB12, FOLATE, FERRITIN, TIBC, IRON, RETICCTPCT in the last 72 hours. Sepsis Labs: No results for input(s): PROCALCITON, LATICACIDVEN in the last 168 hours.  Recent Results (from the past 240 hour(s))  Resp Panel by RT-PCR (Flu A&B, Covid) Nasopharyngeal Swab     Status: None   Collection Time: 07/19/20  3:16 PM   Specimen: Nasopharyngeal Swab; Nasopharyngeal(NP) swabs in vial transport medium  Result Value Ref Range Status   SARS Coronavirus 2 by RT PCR NEGATIVE NEGATIVE Final    Comment: (NOTE) SARS-CoV-2 target nucleic acids are NOT DETECTED.  The SARS-CoV-2 RNA is generally detectable in upper respiratory specimens during the acute phase of infection. The lowest concentration of SARS-CoV-2 viral copies this assay can detect is 138 copies/mL. A negative result does not preclude SARS-Cov-2 infection and should not be used as the sole basis for treatment or other patient management decisions. A negative result may occur with  improper specimen collection/handling, submission of specimen other than nasopharyngeal swab, presence of viral mutation(s) within the areas targeted by this assay, and inadequate number of viral copies(<138 copies/mL). A negative result must be combined with clinical observations, patient history, and epidemiological information. The expected result is Negative.  Fact Sheet for Patients:  EntrepreneurPulse.com.au  Fact Sheet for Healthcare Providers:  IncredibleEmployment.be  This test is no t yet approved or cleared by the [place] FDA and  has been authorized for detection and/or diagnosis of SARS-CoV-2 by FDA under an Emergency Use Authorization (EUA). This EUA will remain   in effect (meaning this test can be used) for the duration of the COVID-19 declaration under Section 564(b)(1) of the Act, 21 U.S.C.section 360bbb-3(b)(1), unless the authorization is terminated  or revoked sooner.       Influenza A by PCR NEGATIVE NEGATIVE Final   Influenza B by PCR NEGATIVE NEGATIVE Final    Comment: (NOTE) The Xpert Xpress SARS-CoV-2/FLU/RSV plus assay is intended as an aid in the diagnosis of influenza from Nasopharyngeal swab specimens and should not be used as a sole basis for treatment. Nasal washings and aspirates are unacceptable for Xpert Xpress SARS-CoV-2/FLU/RSV testing.  Fact Sheet for Patients: EntrepreneurPulse.com.au  Fact Sheet for Healthcare Providers: IncredibleEmployment.be  This test is not yet approved or cleared by the [place] FDA and has been authorized for detection and/or diagnosis of SARS-CoV-2 by FDA under an Emergency Use Authorization (EUA). This EUA will remain in  effect (meaning this test can be used) for the duration of the COVID-19 declaration under Section 564(b)(1) of the Act, 21 U.S.C. section 360bbb-3(b)(1), unless the authorization is terminated or revoked.  Performed at [facility], [address], [place], [place] [address]   Urine Culture     Status: None   Collection Time: 07/21/20  6:47 AM   Specimen: Urine, Random  Result Value Ref Range Status   Specimen Description   Final    URINE, RANDOM Performed at [facility], [address], [place], [place] [address]    Special Requests   Final    NONE Performed at [facility], [address] [place], [place] [address]    Culture   Final    NO GROWTH Performed at [facility], [address] [place], [place] [address]    Report Status 07/23/2020 FINAL  Final         Radiology Studies: ECHOCARDIOGRAM LIMITED  Result Date: 07/24/2020    ECHOCARDIOGRAM LIMITED REPORT    Patient Name:   LAURALEI CLOUSE Date of Exam: 07/24/2020 Medical Rec #:  [id number]         Height:       65.0 in Accession #:    [id number]        Weight:       134.0 lb Date of Birth:  02/13/1941         BSA:          1.669 m Patient Age:    51 years          BP:           156/70 mmHg Patient Gender: F                 HR:           108 bpm. Exam Location:  [facility] Procedure: Limited Echo, Limited Color Doppler and Cardiac Doppler Indications:     I31.3 Pericardial Effusion  History:         Patient has prior history of Echocardiogram examinations, most                  recent 07/20/2020. Risk Factors:HCL, Dyslipidemia and                  Hypertension.  Sonographer:     Charmayne Sheer RDCS (AE) Referring Phys:  [id number] [facility] Diagnosing Phys: Bartholome Bill MD  Sonographer Comments: Technically difficult study due to poor echo windows. IMPRESSIONS  1. Left ventricular ejection fraction, by estimation, is 50 to 55%. The left ventricle has low normal function. The left ventricle has no regional wall motion abnormalities. Left ventricular diastolic parameters were normal.  2. Right ventricular systolic function is normal. The right ventricular size is mildly enlarged.  3. Moderate pericardial effusion. There is no evidence of cardiac tamponade.  4. The mitral valve was not well visualized. Trivial mitral valve regurgitation.  5. The aortic valve was not well visualized. Aortic valve regurgitation is not visualized. FINDINGS  Left Ventricle: Left ventricular ejection fraction, by estimation, is 50 to 55%. The left ventricle has low normal function. The left ventricle has no regional wall motion abnormalities. The left ventricular internal cavity size was normal in size. There is no left ventricular hypertrophy. Left ventricular diastolic parameters were normal. Right Ventricle: The right ventricular size is mildly enlarged. No increase in right ventricular wall thickness. Right ventricular systolic function is normal.  Pericardium: A  moderately sized pericardial effusion is present. There is no evidence of cardiac tamponade. Mitral Valve: The mitral valve was not well visualized. Trivial mitral valve regurgitation. Tricuspid Valve: The tricuspid valve is not well visualized. Tricuspid valve regurgitation is trivial. Aortic Valve: The aortic valve was not well visualized. Aortic valve regurgitation is not visualized. Pulmonic Valve: The pulmonic valve was not well visualized. LEFT VENTRICLE PLAX 2D LVIDd:         3.99 cm LVIDs:         3.26 cm LV PW:         0.96 cm LV IVS:        0.97 cm  Bartholome Bill MD Electronically signed by Bartholome Bill MD Signature Date/Time: 07/24/2020/3:50:25 PM    Final         Scheduled Meds: . azithromycin  500 mg Oral Daily  . bacitracin  1 application Topical QODAY  . budesonide (PULMICORT) nebulizer solution  0.25 mg Nebulization BID  . cholestyramine  4 g Per Tube TID WC  . clotrimazole   Topical QODAY  . enoxaparin (LOVENOX) injection  40 mg Subcutaneous Q24H  . feeding supplement  237 mL Per Tube 5 X Daily  . feeding supplement (PROSource TF)  45 mL Per Tube BID  . fentaNYL  1 patch Transdermal Q72H  . free water  100 mL Per Tube 5 X Daily  . furosemide  20 mg Intravenous Daily  . ipratropium-albuterol  3 mL Nebulization Q4H while awake  . mirtazapine  15 mg Oral QHS  . pravastatin  10 mg Per Tube QHS  . predniSONE  40 mg Oral Q breakfast  . sodium chloride flush  3 mL Intravenous Q12H   Continuous Infusions: . sodium chloride 250 mL (07/23/20 2059)    Assessment & Plan:   Principal Problem:   Acute respiratory failure (HCC) Active Problems:   Essential hypertension   Squamous cell carcinoma of head and neck (HCC)   CKD (chronic kidney disease) stage 3, GFR 30-59 ml/min   Acute CHF (congestive heart failure) (HCC)   COPD (chronic obstructive pulmonary disease) (HCC)   Acute lower UTI   Acute Hypoxic Respiratory Failure - acute CHF vs acute COPD  or combo in  the setting of limited respiratory reserv Weaned to On HFNC 6L .  CT scan showed new lung metastases and no PE. PCCm consulted Cards following Continue with nebs, steroid, lasix and azithrom     Acute diastolic CHF/moderate pericardial effusion:  Cards following.  Grade I diastolic dysfunction and ef 50-55% More euvolemic No evidence of tamponade Decrease lasix to 20 mg iv daily Monitor I/o   Suspected UTI on admission: Urine cultures shows no growth. - S/P 5 days of IV Rocephin.   Stage IV head and neck cancer  - Follow up with Heme/Onc outpatient. 12/8-discussed with oncology about hospice, [facility] will address this as inpt.   Severe Protein Calorie Malnutrition / Oropharyngeal Dysphagia s/p 2019 PEG tube placement -Continue nutrition via PEG tube  Exchanges every 3 months    CKD Stage 3a Creatinine remained stable. Continue to monitor while on Lasix  Hypertension - BP stable.  Hyperlipidemia - Statin  Insomnia - Mirtazepine nightly.  Hearing Impairment  Nutrition Problem: Severe Malnutrition Etiology: cancer and cancer related treatments, other (see comment) (COPD) Signs/Symptoms: moderate fat depletion, severe fat depletion, severe muscle depletion Body mass index is 22.31 kg/m.    DVT prophylaxis: Lovenox Code Status: Full Family Communication: Husband at bedside, updated  Status is: Inpatient  Remains inpatient appropriate because:Inpatient level of care appropriate due to severity of illness   Dispo: The patient is from: Home              Anticipated d/c is to: Home              Anticipated d/c date is: 2 days              Patient currently is not medically stable to d/c.getting iv lasix. On HFNC. pccm consulted            LOS: 6 days   Time spent: 35 minutes with more than 50% on [place], MD [facility] Pager [phone]  If 7PM-7AM, please contact night-coverage 07/25/2020, 5:10 PM

## 2020-07-26 DIAGNOSIS — Z515 Encounter for palliative care: Secondary | ICD-10-CM

## 2020-07-26 LAB — BASIC METABOLIC PANEL
Anion gap: 8 (ref 5–15)
BUN: 53 mg/dL — ABNORMAL HIGH (ref 8–23)
CO2: 24 mmol/L (ref 22–32)
Calcium: 8.8 mg/dL — ABNORMAL LOW (ref 8.9–10.3)
Chloride: 104 mmol/L (ref 98–111)
Creatinine, Ser: 0.88 mg/dL (ref 0.44–1.00)
GFR, Estimated: >60 mL/min (ref >60)
Glucose, Bld: 88 mg/dL (ref 70–99)
Potassium: 4.1 mmol/L (ref 3.5–5.1)
Sodium: 136 mmol/L (ref 135–145)

## 2020-07-26 LAB — PROCALCITONIN: Procalcitonin: <0.1 ng/mL

## 2020-07-26 MED ORDER — FUROSEMIDE 10 MG/ML IJ SOLN
20.0000 mg | Freq: Every day | INTRAMUSCULAR | Status: DC
  Administered 2020-07-26 – 2020-07-27 (×2): 20 mg via INTRAVENOUS
  Filled 2020-07-26 (×2): qty 2

## 2020-07-26 NOTE — Consult Note (Signed)
[facility]  [phone] [phone]   Name: Dana Perez Date: 07/26/2020 MRN: [id number]  DOB: 22-Jan-1941  Patient Care Team: [name], MD as PCP - General (Physician Assistant) [name], MD as Referring Physician (Surgery) [name], MD as Consulting Physician (Oncology)    REASON FOR CONSULTATION: Dana Perez is a 79 y.o. 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  Recent CTs chest, abdomen, and pelvis 06/26/2020 revealed multiple new bilateral pulmonary masses consistent with disease recurrence. Patient was restarted on nivolumab.  She was admitted to the hospital on 07/19/2020 with acute hypoxic respiratory failure. Palliative care is now asked to follow and provide her with support..    SOCIAL HISTORY:     reports that she quit smoking about 3 years ago. Her smoking use included cigarettes. She smoked 0.25 packs per day. She has never used smokeless tobacco. She reports that she does not drink alcohol and does not use drugs.  Patient is married. She lives at home with her husband. She has no children. 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  ADVANCE DIRECTIVES:  Patient does not currently have advance directives. She would want her husband to be her decision-maker. Patient and husband have talked about completing a living will but have not currently done so.  CODE STATUS: DNR  PAST MEDICAL HISTORY: Past Medical History:  Diagnosis Date  . Acute hypoxemic respiratory failure ([place]) 11/15/2017  . Acute respiratory failure ([place]) 11/15/2017  . Adenocarcinoma of colon  ([place])   . Benign neoplasm of ascending colon   . CAP (community acquired pneumonia) 11/15/2017  . Essential hypertension 06/13/2014  . Goals of care, counseling/discussion 07/14/2017  . Healthcare-associated pneumonia   . HLD (hyperlipidemia) 06/13/2014  . Hypercholesteremia   . Hyperlipidemia   . Melena   . Multiple thyroid nodules 01/22/2012  . Osteoporosis   . Polycythemia, secondary 12/26/2014  . Pure hypercholesterolemia 03/15/2015  . Squamous cell carcinoma of head and neck ([place])   . Thyroid nodule     PAST SURGICAL HISTORY:  Past Surgical History:  Procedure Laterality Date  . ABDOMINAL HYSTERECTOMY    . COLONOSCOPY WITH PROPOFOL N/A 07/02/2017   Procedure: COLONOSCOPY WITH PROPOFOL;  Surgeon: Lucilla Lame, MD;  Location: [place];  Service: Endoscopy;  Laterality: N/A;  . ESOPHAGOGASTRODUODENOSCOPY N/A 05/17/2019   Procedure: ESOPHAGOGASTRODUODENOSCOPY (EGD);  Surgeon: Lin Landsman, MD;  Location[facility] ENDOSCOPY;  Service: Gastroenterology;  Laterality: N/A;  . ORIF ANKLE FRACTURE Left 08/08/2017   Procedure: OPEN REDUCTION INTERNAL FIXATION (ORIF) ANKLE FRACTURE;  Surgeon: Dereck Leep, MD;  Location: [facility];  Service: Orthopedics;  Laterality: Left;  . POLYPECTOMY N/A 07/02/2017   Procedure: POLYPECTOMY;  Surgeon: Lucilla Lame, MD;  Location: [place];  Service: Endoscopy;  Laterality: N/A;  . WRIST FRACTURE SURGERY  08/2009   badly broken    HEMATOLOGY/ONCOLOGY HISTORY:  Oncology History  Squamous cell carcinoma of head and neck ([place])  07/01/2017 Initial Diagnosis   Squamous cell carcinoma of head and neck ([place])   04/08/2018 -  Chemotherapy   The patient had nivolumab (OPDIVO) 240 mg in sodium chloride 0.9 % 100 mL chemo infusion, 240 mg, Intravenous,  Once, 17 of 28 cycles Administration: 240 mg (04/08/2018), 240 mg (04/22/2018), 240 mg (05/06/2018), 240 mg (05/20/2018), 240 mg (06/03/2018), 240 mg (06/24/2018), 240 mg (07/08/2018), 240 mg  (07/22/2018), 240 mg (08/05/2018), 240 mg (08/19/2018), 240 mg (09/02/2018), 240 mg (09/16/2018), 240 mg (09/30/2018), 240 mg (10/14/2018), 240 mg (10/28/2018), 240 mg (11/11/2018), 240 mg (07/19/2020)  for chemotherapy treatment.      ALLERGIES:  is allergic to buprenorphine, other, and tetanus antitoxin.  MEDICATIONS:  Current Facility-Administered Medications  Medication Dose Route Frequency Provider Last Rate Last Admin  . 0.9 %  sodium chloride infusion  250 mL Intravenous PRN Agbata, Tochukwu, MD 10 mL/hr at 07/23/20 2059 250 mL at 07/23/20 2059  . acetaminophen (TYLENOL) tablet 650 mg  650 mg Oral Q6H PRN Agbata, Tochukwu, MD   650 mg at 07/25/20 1215  . albuterol (VENTOLIN HFA) 108 (90 Base) MCG/ACT inhaler 1-2 puff  1-2 puff Inhalation Q4H PRN George Hugh, MD      . azithromycin (ZITHROMAX) tablet 500 mg  500 mg Oral Daily George Hugh, MD   500 mg at 07/25/20 7782  . bacitracin ointment 1 application  1 application Topical Tonye Pearson, MD   1 application at 42/35/36 1032  . benzocaine (ORAJEL) 10 % mucosal gel   Mouth/Throat BID PRN Nolberto Hanlon, MD      . budesonide (PULMICORT) nebulizer solution 0.25 mg  0.25 mg Nebulization BID George Hugh, MD   0.25 mg at 07/26/20 0819  . chlorpheniramine-HYDROcodone (TUSSIONEX) 10-8 MG/5ML suspension 5 mL  5 mL Per Tube Q12H PRN Agbata, Tochukwu, MD      . cholestyramine (QUESTRAN) packet 4 g  4 g Per Tube TID WC Jennye Boroughs, MD   4 g at 07/25/20 1623  . clotrimazole (LOTRIMIN) 1 % cream   Topical Tonye Pearson, MD   Given at 07/24/20 1032  . enoxaparin (LOVENOX) injection 40 mg  40 mg Subcutaneous Q24H Agbata, Tochukwu, MD   40 mg at 07/25/20 1957  . feeding supplement (ENSURE ENLIVE / ENSURE PLUS) liquid 237 mL  237 mL Per Tube 5 X Daily Jennye Boroughs, MD   237 mL at 07/26/20 0600  . feeding supplement (PROSource TF) liquid 45 mL  45 mL Per Tube BID Jennye Boroughs, MD   45 mL at 07/25/20 1957  . fentaNYL (DURAGESIC) 50 MCG/HR  1 patch  1 patch Transdermal Q72H Jennye Boroughs, MD   1 patch at 07/24/20 1042  . free water 100 mL  100 mL Per Tube 5 X Daily Jennye Boroughs, MD   100 mL at 07/26/20 0600  . hydrOXYzine (ATARAX/VISTARIL) tablet 10 mg  10 mg Per Tube BID PRN Agbata, Tochukwu, MD      . ipratropium-albuterol (DUONEB) 0.5-2.5 (3) MG/3ML nebulizer solution 3 mL  3 mL Nebulization Q4H while awake George Hugh, MD   3 mL at 07/26/20 0819  . mirtazapine (REMERON) tablet 15 mg  15 mg Oral QHS Agbata, Tochukwu, MD   15 mg at 07/25/20 1958  . morphine CONCENTRATE 10 MG/0.5ML oral solution 5 mg  5 mg Per Tube Q4H PRN Agbata, Tochukwu, MD   5 mg at 07/26/20 0042  . ondansetron (ZOFRAN) injection 4 mg  4 mg Intravenous Q6H PRN Agbata, Tochukwu, MD   4 mg at 07/26/20 0042  . pravastatin (PRAVACHOL) tablet 10 mg  10 mg Per Tube QHS Agbata, Tochukwu, MD   10 mg at 07/25/20 1958  . predniSONE (DELTASONE) tablet 40 mg  40 mg Oral  Q breakfast George Hugh, MD   40 mg at 07/25/20 2423  . sodium chloride flush (NS) 0.9 % injection 3 mL  3 mL Intravenous Q12H Agbata, Tochukwu, MD   3 mL at 07/25/20 2001  . sodium chloride flush (NS) 0.9 % injection 3 mL  3 mL Intravenous PRN Agbata, Tochukwu, MD       Facility-Administered Medications Ordered in Other Encounters  Medication Dose Route Frequency Provider Last Rate Last Admin  . heparin lock flush 100 unit/mL  250 Units Intracatheter PRN Leia Alf, MD      . heparin lock flush 100 unit/mL  500 Units Intracatheter PRN Leia Alf, MD      . sodium chloride 0.9 % injection 10 mL  10 mL Intracatheter PRN Ma Hillock, Sandeep, MD      . sodium chloride 0.9 % injection 10 mL  10 mL Intracatheter PRN Ma Hillock, Sandeep, MD      . sodium chloride 0.9 % injection 10 mL  10 mL Intracatheter PRN Ma Hillock, Sandeep, MD      . sodium chloride 0.9 % injection 10 mL  10 mL Intracatheter PRN Leia Alf, MD        VITAL SIGNS: BP (!) 142/78 (BP Location: Right Arm)   Pulse 98   Temp 97.7  F (36.5 C) (Axillary)   Resp 20   Ht 5' 5" (1.651 m)   Wt 136 lb 3.9 oz (61.8 kg)   SpO2 91%   BMI 22.67 kg/m  Filed Weights   07/23/20 0401 07/24/20 0439 07/26/20 0537  Weight: 131 lb 14.4 oz (59.8 kg) 134 lb 0.6 oz (60.8 kg) 136 lb 3.9 oz (61.8 kg)    Estimated body mass index is 22.67 kg/m as calculated from the following:   Height as of this encounter: 5' 5" (1.651 m).   Weight as of this encounter: 136 lb 3.9 oz (61.8 kg).  LABS: CBC:    Component Value Date/Time   WBC 9.6 07/25/2020 0454   HGB 15.1 (H) 07/25/2020 0454   HCT 47.2 (H) 07/25/2020 0454   PLT 357 07/25/2020 0454   MCV 92.7 07/25/2020 0454   NEUTROABS 6.1 07/22/2020 0625   LYMPHSABS 1.2 07/22/2020 0625   MONOABS 0.6 07/22/2020 0625   EOSABS 0.1 07/22/2020 0625   BASOSABS 0.0 07/22/2020 0625   Comprehensive Metabolic Panel:    Component Value Date/Time   NA 136 07/26/2020 0419   K 4.1 07/26/2020 0419   CL 104 07/26/2020 0419   CO2 24 07/26/2020 0419   BUN 53 (H) 07/26/2020 0419   CREATININE 0.88 07/26/2020 0419   GLUCOSE 88 07/26/2020 0419   CALCIUM 8.8 (L) 07/26/2020 0419   AST 31 07/19/2020 0944   ALT 19 07/19/2020 0944   ALKPHOS 115 07/19/2020 0944   BILITOT 0.5 07/19/2020 0944   PROT 7.3 07/19/2020 0944   ALBUMIN 3.6 07/19/2020 0944    RADIOGRAPHIC STUDIES: CT SOFT TISSUE NECK W CONTRAST  Result Date: 06/26/2020 CLINICAL DATA:  Follow-up head and neck cancer. Squamous cell carcinoma. Surveillance imaging. EXAM: CT NECK WITH CONTRAST TECHNIQUE: Multidetector CT imaging of the neck was performed using the standard protocol following the bolus administration of intravenous contrast. Not utilized. Stenosis is probably in the range of 50% on both sides. Diminutive right jugular vein. CONTRAST:  138m OMNIPAQUE IOHEXOL 300 MG/ML  SOLN COMPARISON:  12/07/2019.  06/07/2019.  05/08/2017. FINDINGS: Pharynx and larynx: No mucosal or submucosal lesion. Salivary glands: Left submandibular gland is  diminutive but normal. Right submandibular  gland surgically absent. Parotid glands are normal. Thyroid: Normal Lymph nodes: No residual or recurrent lymphadenopathy. Previous right mandibular resection with regional flap reconstruction. Similar appearance of the soft tissues of the region without evidence of recurrent mass. Vascular: Bilateral carotid atherosclerotic disease at the bifurcations. CT angiographic technique was not utilized. Stenosis estimated at approximately 50% on both sides. Diminutive right jugular vein. Limited intracranial: Negative Visualized orbits: Negative Mastoids and visualized paranasal sinuses: Clear Skeleton: Ordinary cervical spondylosis. Right mandibular resection as noted above. No malignant bone findings. Upper chest: See results of chest CT. 1.5 cm density right upper lobe. Other: None IMPRESSION: 1. Previous right mandibular resection with regional flap reconstruction. Similar appearance of the soft tissues of the region without evidence of recurrent mass. No residual or recurrent lymphadenopathy. 2. Bilateral carotid atherosclerotic disease. Stenosis estimated at approximately 50% on both sides. CT angiographic technique was not utilized. 3. 1.5 cm density right upper lobe.  See results of chest CT. Electronically Signed   By: Nelson Chimes M.D.   On: 06/26/2020 11:20   CT CHEST ABDOMEN PELVIS W CONTRAST  Result Date: 06/26/2020 CLINICAL DATA:  Metastatic head and neck cancer, history of colon cancer EXAM: CT CHEST, ABDOMEN, AND PELVIS WITH CONTRAST TECHNIQUE: Multidetector CT imaging of the chest, abdomen and pelvis was performed following the standard protocol during bolus administration of intravenous contrast. CONTRAST:  183m OMNIPAQUE IOHEXOL 300 MG/ML SOLN, additional oral enteric contrast COMPARISON:  12/07/2019 FINDINGS: CT CHEST FINDINGS Cardiovascular: Aortic atherosclerosis. Normal heart size. Left and right coronary artery calcifications. Small pericardial  effusion, unchanged. Mediastinum/Nodes: No enlarged mediastinal, hilar, or axillary lymph nodes. Thyroid gland, trachea, and esophagus demonstrate no significant findings. Lungs/Pleura: Severe centrilobular emphysema. There are multiple new bilateral pulmonary nodules, in the peripheral right apex measuring 1.5 x 1.4 cm (series 8, image 24), in the anterior right middle lobe measuring 1.8 x 1.2 cm (series 8, image 84) and in the superior segment left lower lobe measuring 2.2 x 1.7 cm (series 8, image 82). No pleural effusion or pneumothorax. Musculoskeletal: No chest wall mass or suspicious bone lesions identified. CT ABDOMEN PELVIS FINDINGS Hepatobiliary: No solid liver abnormality is seen. No gallstones, gallbladder wall thickening, or biliary dilatation. Pancreas: Unremarkable. No pancreatic ductal dilatation or surrounding inflammatory changes. Spleen: Normal in size without significant abnormality. Adrenals/Urinary Tract: Adrenal glands are unremarkable. Kidneys are normal, without renal calculi, solid lesion, or hydronephrosis. Bladder is unremarkable. Stomach/Bowel: Percutaneous gastrostomy. Stomach is otherwise within normal limits. Appendix is not clearly visualized. No evidence of bowel wall thickening, distention, or inflammatory changes. Vascular/Lymphatic: No significant vascular findings are present. No enlarged abdominal or pelvic lymph nodes. Reproductive: Status post hysterectomy. Other: No abdominal wall hernia or abnormality. No abdominopelvic ascites. Musculoskeletal: No acute or significant osseous findings. IMPRESSION: 1. There are multiple new bilateral pulmonary nodules, most consistent with new pulmonary metastatic disease although conceivably infectious or inflammatory in nature. Consider tissue sampling. 2. No evidence of metastatic disease within the abdomen or pelvis. 3. Emphysema (ICD10-J43.9). 4. Small, unchanged pericardial effusion. 5. Coronary artery disease.  Aortic Atherosclerosis  (ICD10-I70.0). Electronically Signed   By: AEddie CandleM.D.   On: 06/26/2020 15:57   DG Chest Port 1 View  Result Date: 07/19/2020 CLINICAL DATA:  79year old female with shortness of breath. EXAM: PORTABLE CHEST 1 VIEW COMPARISON:  Chest radiograph dated 11/15/2017. FINDINGS: There is cardiomegaly with vascular congestion and edema. Small bilateral pleural effusions, right greater left, with bibasilar atelectasis. Pneumonia is not excluded clinical correlation is recommended.  No pneumothorax. Atherosclerotic calcification of the aorta. No acute osseous pathology. IMPRESSION: Cardiomegaly with findings of CHF and small bilateral pleural effusions. Pneumonia is not excluded. Electronically Signed   By: Anner Crete M.D.   On: 07/19/2020 15:32   ECHOCARDIOGRAM COMPLETE  Result Date: 07/20/2020    ECHOCARDIOGRAM REPORT   Patient Name:   Dana Perez Date of Exam: 07/20/2020 Medical Rec #:  [id number]         Height:       65.0 in Accession #:    [id number]        Weight:       138.0 lb Date of Birth:  11-09-1940         BSA:          1.690 m Patient Age:    11 years          BP:           157/76 mmHg Patient Gender: F                 HR:           96 bpm. Exam Location:  [facility] Procedure: 2D Echo, Cardiac Doppler and Color Doppler Indications:     CHF-acute diastolic 937.34  History:         Patient has no prior history of Echocardiogram examinations.                  Risk Factors:Dyslipidemia.  Sonographer:     Sherrie Sport RDCS (AE) Referring Phys:  [id number] Royce Macadamia AGBATA Diagnosing Phys: Yolonda Kida MD  Sonographer Comments: Technically challenging study due to limited acoustic windows, no apical window and no subcostal window. IMPRESSIONS  1. Left ventricular ejection fraction, by estimation, is 65 to 70%. The left ventricle has normal function. The left ventricle has no regional wall motion abnormalities. There is mild left ventricular hypertrophy. Left ventricular diastolic parameters are  consistent with Grade I diastolic dysfunction (impaired relaxation).  2. Right ventricular systolic function is mildly reduced. The right ventricular size is moderately enlarged.  3. Moderate pericardial effusion.  4. The mitral valve is grossly normal. Mild mitral valve regurgitation.  5. The aortic valve is normal in structure. Aortic valve regurgitation is trivial. FINDINGS  Left Ventricle: Left ventricular ejection fraction, by estimation, is 65 to 70%. The left ventricle has normal function. The left ventricle has no regional wall motion abnormalities. The left ventricular internal cavity size was normal in size. There is  mild left ventricular hypertrophy. Left ventricular diastolic parameters are consistent with Grade I diastolic dysfunction (impaired relaxation). Right Ventricle: The right ventricular size is moderately enlarged. No increase in right ventricular wall thickness. Right ventricular systolic function is mildly reduced. Left Atrium: Left atrial size was normal in size. Right Atrium: Right atrial size was normal in size. Pericardium: A moderately sized pericardial effusion is present. Mitral Valve: The mitral valve is grossly normal. Mild mitral valve regurgitation. Tricuspid Valve: The tricuspid valve is grossly normal. Tricuspid valve regurgitation is mild. Aortic Valve: The aortic valve is normal in structure. Aortic valve regurgitation is trivial. Pulmonic Valve: The pulmonic valve was grossly normal. Pulmonic valve regurgitation is not visualized. Aorta: The aortic arch was not well visualized. IAS/Shunts: No atrial level shunt detected by color flow Doppler.  LEFT VENTRICLE PLAX 2D LVIDd:         3.82 cm LVIDs:         2.48 cm LV PW:  0.92 cm LV IVS:        1.11 cm LVOT diam:     2.00 cm LVOT Area:     3.14 cm  LEFT ATRIUM         Index LA diam:    2.30 cm 1.36 cm/m                        PULMONIC VALVE AORTA                 PV Vmax:        0.67 m/s Ao Root diam: 2.30 cm PV Peak  grad:   1.8 mmHg                       RVOT Peak grad: 2 mmHg  TRICUSPID VALVE TR Peak grad:   23.6 mmHg TR Vmax:        243.00 cm/s  SHUNTS Systemic Diam: 2.00 cm Yolonda Kida MD Electronically signed by Yolonda Kida MD Signature Date/Time: 07/20/2020/3:01:26 PM    Final    ECHOCARDIOGRAM LIMITED  Result Date: 07/24/2020    ECHOCARDIOGRAM LIMITED REPORT   Patient Name:   Dana Perez Date of Exam: 07/24/2020 Medical Rec #:  [id number]         Height:       65.0 in Accession #:    [id number]        Weight:       134.0 lb Date of Birth:  [birth year]         BSA:          1.669 m Patient Age:    4 years          BP:           156/70 mmHg Patient Gender: F                 HR:           108 bpm. Exam Location:  [facility] Procedure: Limited Echo, Limited Color Doppler and Cardiac Doppler Indications:     I31.3 Pericardial Effusion  History:         Patient has prior history of Echocardiogram examinations, most                  recent 07/20/2020. Risk Factors:HCL, Dyslipidemia and                  Hypertension.  Sonographer:     Charmayne Sheer RDCS (AE) Referring Phys:  [id number] [facility] MASOUD Diagnosing Phys: Bartholome Bill MD  Sonographer Comments: Technically difficult study due to poor echo windows. IMPRESSIONS  1. Left ventricular ejection fraction, by estimation, is 50 to 55%. The left ventricle has low normal function. The left ventricle has no regional wall motion abnormalities. Left ventricular diastolic parameters were normal.  2. Right ventricular systolic function is normal. The right ventricular size is mildly enlarged.  3. Moderate pericardial effusion. There is no evidence of cardiac tamponade.  4. The mitral valve was not well visualized. Trivial mitral valve regurgitation.  5. The aortic valve was not well visualized. Aortic valve regurgitation is not visualized. FINDINGS  Left Ventricle: Left ventricular ejection fraction, by estimation, is 50 to 55%. The left ventricle has low normal function. The  left ventricle has no regional wall motion abnormalities. The left ventricular internal cavity size was normal in size. There is no left ventricular hypertrophy. Left ventricular diastolic parameters  were normal. Right Ventricle: The right ventricular size is mildly enlarged. No increase in right ventricular wall thickness. Right ventricular systolic function is normal. Pericardium: A moderately sized pericardial effusion is present. There is no evidence of cardiac tamponade. Mitral Valve: The mitral valve was not well visualized. Trivial mitral valve regurgitation. Tricuspid Valve: The tricuspid valve is not well visualized. Tricuspid valve regurgitation is trivial. Aortic Valve: The aortic valve was not well visualized. Aortic valve regurgitation is not visualized. Pulmonic Valve: The pulmonic valve was not well visualized. LEFT VENTRICLE PLAX 2D LVIDd:         3.99 cm LVIDs:         3.26 cm LV PW:         0.96 cm LV IVS:        0.97 cm  Bartholome Bill MD Electronically signed by Bartholome Bill MD Signature Date/Time: 07/24/2020/3:50:25 PM    Final     PERFORMANCE STATUS (ECOG) : 4 - Bedbound  Review of Systems Unable to complete  Physical Exam General: Thin, frail-appearing, ill-appearing Cardiovascular: regular rate and rhythm Pulmonary: Unlabored, on O2 Abdomen: PEG GU: no suprapubic tenderness Extremities: no edema, no joint deformities Skin: no rashes Neurological: Weakness, some confusion  IMPRESSION: Patient is well-known to me from the clinic.  Patient has persistently required high amounts of oxygen due to persistent hypoxia.  She has been weaned down to a 6 L by nasal cannula the current SaO2 is in the low 90s.  I met with patient's husband to discuss goals.  He tells me that patient has markedly declined over the past several months with progressive weakness.  Her mobility is further limited by dyspnea.  He was hopeful that patient would have improvement with immunotherapy but he  recognizes that could take weeks to months.  He also recognizes that patient may not have a viable path to tolerate more cancer treatment given her progressive frailty.    Patient's husband is not interested in in rehab or home health at time of discharge.  We discussed the option of hospice today in detail.  Husband says that he would prefer to have hospice involvement and keep patient comfortable at home.  He is familiar with the option of hospice IPU should she need that for symptom management for end-of-life care.  We will have the hospice liaison also meet with patient's husband to discuss services.  Husband confirms that patient would not want to be resuscitated or have her life prolonged artificially on life support.  He is in agreement with DNR/DNI.  PLAN: -Recommend best supportive care -Hospice at home when medically ready for discharge -Continue weaning O2 as tolerated -DNR/DNI  Case and plan discussed with Drs. Grayland Ormond and Amery   Time Total: 60 minutes  Visit consisted of counseling and education dealing with the complex and emotionally intense issues of symptom management and palliative care in the setting of serious and potentially life-threatening illness.Greater than 50%  of this time was spent counseling and coordinating care related to the above assessment and plan.  Signed by: Altha Harm, PhD, NP-C

## 2020-07-26 NOTE — Progress Notes (Signed)
[facility]  Patient Description: Mrs. Palma is a 79 y/o female with PMH significant for HTN,  CKD Stage III, COPD, progressive stage Iva head and neck squamous cell carcinoma and adenocarcinoma of the colon who was admitted for acute respiratory failure. Cardiology consulted for possible acute CHF.   SUBJECTIVE: The patient reports to be doing better on today. She denies having any chest pain or dyspnea at this time. She reports that her abdominal pain has resolved also.   07/25/20: The patient reports to be doing "alright" on today but she c/o abdominal pain. The patient denies having any chest pain and states that her breathing has much improved.  07/24/20: Today the patient reports that she is not feeling well. She denies having any chest pain or dyspnea. Her husband is at the bedside and reports that the patient states "she said that she is not in pain, she just does not feel well."   12/6//21: The patient reports to be doing okay and she denies having any chest pain or worsening dyspnea at this time. The husband is at the bedside and reports that the patient has had good output and has not complained of any pain or edema.   OBJECTIVE: The patient appears slightly better on today and she is currently receiving a nebulizer treatment. Her bedside ECG continues to reveal NSR with a HR of 81 bpm and she continues to appear euvolemic.   07/25/20: The patient was more communicative on today and appears slightly better compared to yesterday. Her bedside ECG reveals ST likely due to her recent nebulizer treatment and she remains asymptomatic. She appears euvolemic and her recent echocardiogram continues to reveal moderate pericardial effusion without evidence of tamponade.  Her oxygen has been weaned to 6L via [place] and she her oxygen saturation is at 89%.   07/25/20: The patient is resting in bed and is less communicative on today. Her bedside ECG reveals NSR with a HR of 93 bpm and she appears to be  euvolemic.   07/23/20: The patient did not speak much, appears chronically ill and is no apparent distress. She is resting comfortably in bed, on supplemental oxygen with normal effort of breathing. She is in NSR with HR of 97 bpm on the bedside monitor and she appears euvolemic.   Vitals:   07/26/20 0758 07/26/20 0821 07/26/20 1150 07/26/20 1158  BP: (!) 142/78  112/63   Pulse: 98  92   Resp: 20  16   Temp: 97.7 F (36.5 C)  97.9 F (36.6 C)   TempSrc: Axillary  Axillary   SpO2: 91% 91% 93% 93%  Weight:      Height:         Intake/Output Summary (Last 24 hours) at 07/26/2020 1226 Last data filed at 07/26/2020 1150 Gross per 24 hour  Intake --  Output 1100 ml  Net -1100 ml      PHYSICAL EXAM  General: appears chronically ill,  in no acute distress HEENT:  Normocephalic and atraumatic.  Neck:  No JVD. Negative for rigidity.  Lungs: Clear bilaterally to upper lobes, diminished in lower lobes upon auscultation. Chest expansion appears symmetrical. Normal effort of breathing.  Heart: HRRR . Normal S1 and S2 without gallops or murmurs.  Abdomen: Bowel sounds are positive, abdomen soft and non-tender  Msk: Decreased strength and tone for age. Extremities: No clubbing, cyanosis or edema.  +2 peripheral pulses.  Neuro: Alert and oriented X 2. Psych:  flat affect, responds appropriately   LABS:  Basic Metabolic Panel: Recent Labs    07/25/20 0454 07/26/20 0419  NA 143 136  K 4.2 4.1  CL 107 104  CO2 26 24  GLUCOSE 111* 88  BUN 54* 53*  CREATININE 0.96 0.88  CALCIUM 9.0 8.8*   Liver Function Tests: No results for input(s): AST, ALT, ALKPHOS, BILITOT, PROT, ALBUMIN in the last 72 hours. No results for input(s): LIPASE, AMYLASE in the last 72 hours. CBC: Recent Labs    07/24/20 0553 07/25/20 0454  WBC 8.5 9.6  HGB 15.0 15.1*  HCT 46.8* 47.2*  MCV 91.8 92.7  PLT 345 357   Cardiac Enzymes: No results for input(s): CKTOTAL, CKMB, CKMBINDEX, TROPONINI in the last  72 hours. BNP: Invalid input(s): POCBNP D-Dimer: No results for input(s): DDIMER in the last 72 hours. Hemoglobin A1C: No results for input(s): HGBA1C in the last 72 hours. Fasting Lipid Panel: No results for input(s): CHOL, HDL, LDLCALC, TRIG, CHOLHDL, LDLDIRECT in the last 72 hours. Thyroid Function Tests: No results for input(s): TSH, T4TOTAL, T3FREE, THYROIDAB in the last 72 hours.  Invalid input(s): FREET3 Anemia Panel: No results for input(s): VITAMINB12, FOLATE, FERRITIN, TIBC, IRON, RETICCTPCT in the last 72 hours.  ECHOCARDIOGRAM LIMITED  Result Date: 07/24/2020    ECHOCARDIOGRAM LIMITED REPORT   Patient Name:   ROSALENA MCCORRY Date of Exam: 07/24/2020 Medical Rec #:  [id number]         Height:       65.0 in Accession #:    [id number]        Weight:       134.0 lb Date of Birth:  1941-08-10         BSA:          1.669 m Patient Age:    32 years          BP:           156/70 mmHg Patient Gender: F                 HR:           108 bpm. Exam Location:  [facility] Procedure: Limited Echo, Limited Color Doppler and Cardiac Doppler Indications:     I31.3 Pericardial Effusion  History:         Patient has prior history of Echocardiogram examinations, most                  recent 07/20/2020. Risk Factors:HCL, Dyslipidemia and                  Hypertension.  Sonographer:     Charmayne Sheer RDCS (AE) Referring Phys:  [id number] [facility] Diagnosing Phys: Bartholome Bill MD  Sonographer Comments: Technically difficult study due to poor echo windows. IMPRESSIONS  1. Left ventricular ejection fraction, by estimation, is 50 to 55%. The left ventricle has low normal function. The left ventricle has no regional wall motion abnormalities. Left ventricular diastolic parameters were normal.  2. Right ventricular systolic function is normal. The right ventricular size is mildly enlarged.  3. Moderate pericardial effusion. There is no evidence of cardiac tamponade.  4. The mitral valve was not well visualized. Trivial  mitral valve regurgitation.  5. The aortic valve was not well visualized. Aortic valve regurgitation is not visualized. FINDINGS  Left Ventricle: Left ventricular ejection fraction, by estimation, is 50 to 55%. The left ventricle has low normal function. The left ventricle has no regional wall motion abnormalities. The left ventricular internal cavity size  was normal in size. There is no left ventricular hypertrophy. Left ventricular diastolic parameters were normal. Right Ventricle: The right ventricular size is mildly enlarged. No increase in right ventricular wall thickness. Right ventricular systolic function is normal. Pericardium: A moderately sized pericardial effusion is present. There is no evidence of cardiac tamponade. Mitral Valve: The mitral valve was not well visualized. Trivial mitral valve regurgitation. Tricuspid Valve: The tricuspid valve is not well visualized. Tricuspid valve regurgitation is trivial. Aortic Valve: The aortic valve was not well visualized. Aortic valve regurgitation is not visualized. Pulmonic Valve: The pulmonic valve was not well visualized. LEFT VENTRICLE PLAX 2D LVIDd:         3.99 cm LVIDs:         3.26 cm LV PW:         0.96 cm LV IVS:        0.97 cm  Bartholome Bill MD Electronically signed by Bartholome Bill MD Signature Date/Time: 07/24/2020/3:50:25 PM    Final      Echo: IMPRESSIONS  1. Left ventricular ejection fraction, by estimation, is 50 to 55%. The  left ventricle has low normal function. The left ventricle has no regional  wall motion abnormalities. Left ventricular diastolic parameters were  normal.  2. Right ventricular systolic function is normal. The right ventricular  size is mildly enlarged.  3. Moderate pericardial effusion. There is no evidence of cardiac  tamponade.  4. The mitral valve was not well visualized. Trivial mitral valve  regurgitation.  5. The aortic valve was not well visualized. Aortic valve regurgitation  is not visualized.    TELEMETRY: NSR with 81 bpm  ASSESSMENT AND PLAN:  Principal Problem:   Acute respiratory failure (HCC) Active Problems:   Essential hypertension   Squamous cell carcinoma of head and neck (HCC)   CKD (chronic kidney disease) stage 3, GFR 30-59 ml/min   Acute CHF (congestive heart failure) (HCC)   COPD (chronic obstructive pulmonary disease) (HCC)   Acute lower UTI   Palliative care encounter    1. Acute CHF, stable, grade I diastolic dysfunction, patient appears euvolemic  -Recommend decreasing IV lasix to 20mg  once daily.   - Invasive Cardiac studies are not recommended at this time.  -Monitor strict I&O's, follow low sodium diet and perform daily weights.   -Monitor renal functioning.   -Continuous telemetry monitoring.   2. Moderate pericardial effusion, stable  -No evidence of tamponade.   -We will continue conservative management.   3. Acute respiratory failure, fairly stable  -Agree with current management and supplemental oxygen.   -RT consult appreciated.   4. HTN, controlled, patient is normotensive  -Recommend contiuing to hold antihypertensive medications at this time in the presence of lower blood pressures.   5. CKD stage III, fairly stable  -agree with current therapy.  - recommend decreasing lasix.   6. Squamous cell carcinoma of head and neck  -Management per Oncology appreciated.   -Palliative care consult appreciated.     [place], ACNPC-AG  07/26/2020 12:26 PM

## 2020-07-26 NOTE — Progress Notes (Signed)
Mobility Specialist - Progress Note   07/26/20 1637  Mobility  Activity Dangled on edge of bed  Range of Motion/Exercises Right leg;Left leg (ankle pumps, straight leg raises)  Level of Assistance Moderate assist, patient does 50-74%  Assistive Device None  Distance Ambulated (ft) 0 ft  Mobility Response Tolerated well  Mobility performed by Mobility specialist  $Mobility charge 1 Mobility    Pre-mobility: 86 HR, 92% SpO2 During mobility: 91 HR, 93% SpO2 Post-mobility: 86 HR, 94% SpO2   Pt was lying in bed upon arrival utilizing 5L. Pt agreed to session. Pt denied any pain, nausea, or fatigue, or lightheadedness. Pt was able to get EOB with minA but demonstrates poor sitting-balance, requiring bed rails and modA to maintain upright position. Pt expressed fear of falling upon sitting, mobility acknowledged pt's concern and placed hand on pt's back for extra UE support. Pt dangled EOB for a few minutes, performing straight leg raises and ankle pumps before c/o feeling lightheaded. Pt requested to return to supine and further mobility was limited. Overall, pt tolerated session well. Pt was left in bed with all needs in reach and alarm set.    Kathee Delton Mobility Specialist 07/26/20, 4:45 PM

## 2020-07-26 NOTE — Care Management Important Message (Signed)
Important Message  Patient Details  Name: Dana Perez MRN: [id number] Date of Birth: 04/18/1941   Medicare Important Message Given:  Yes     [name] 07/26/2020, 1:21 PM

## 2020-07-26 NOTE — Progress Notes (Signed)
PROGRESS NOTE    Dana Perez  CHE:[id number] DOB: 04/05/1941 DOA: 07/19/2020 PCP: [name], MD    Brief Narrative:  Dana Perez is a 79 y.o. female with medical history of Chronic Tobacco Use and COPD, Stage 4 squamous cell carcinoma of the head and neck s/p 2019 right segmental mandibulectomy and pectoralis flap as well as CRT and immunotherapy, adenocarcinoma of the colon, Stage 3 CKD, osteoporosis and history of fractures, and severe protein calorie malnutrition s/p 2019 PEG tube placement who presented to the ED with acute hypoxia requiring 15L [place] found to have new lung metastases.   12/8-per nsg, pt's mouth has ulcers. Have asked [facility] to help with giving medication to help with this  12/9- pt now DNR. Husband agreeable to home hospice when medically stable. Palliative saw pt this am.  Consultants:   Pccm, cardiology, oncology  Procedures:   Antimicrobials:       Subjective: Pt c/o some sob. Sitting quitely in bed. Husband at bedside  Objective: Vitals:   07/26/20 0056 07/26/20 0537 07/26/20 0758 07/26/20 0821  BP:  (!) 150/72 (!) 142/78   Pulse: 80 78 98   Resp: 18 16 20    Temp:  (!) 97.4 F (36.3 C) 97.7 F (36.5 C)   TempSrc:  Oral Axillary   SpO2: 95% 91% 91% 91%  Weight:  61.8 kg    Height:        Intake/Output Summary (Last 24 hours) at 07/26/2020 0837 Last data filed at 07/25/2020 1831 Gross per 24 hour  Intake --  Output 1200 ml  Net -1200 ml   Filed Weights   07/23/20 0401 07/24/20 0439 07/26/20 0537  Weight: 59.8 kg 60.8 kg 61.8 kg    Examination:  Sitting upright in bed, quiet, NAD husband at bedside Mild wheezing bilaterally, coarse breath sounds Regular S1-S2 no murmurs Soft benign positive bowel sounds Trace pedal edema bilaterally Awake, unable to do do full exam Flat affect    Data Reviewed: I have personally reviewed following labs and imaging studies  CBC: Recent Labs  Lab 07/19/20 0944 07/19/20 1454  07/22/20 0625 07/24/20 0553 07/25/20 0454  WBC 9.8 8.9 8.1 8.5 9.6  NEUTROABS 8.3*  --  6.1  --   --   HGB 15.6* 15.3* 14.8 15.0 15.1*  HCT 50.0* 48.9* 46.5* 46.8* 47.2*  MCV 95.6 95.7 93.0 91.8 92.7  PLT 385 371 340 345 536   Basic Metabolic Panel: Recent Labs  Lab 07/20/20 0447 07/21/20 0619 07/22/20 0625 07/23/20 0508 07/24/20 0553 07/25/20 0454 07/26/20 0419  NA 146* 141 139 140 139 143 136  K 3.6 3.5 3.5 3.3* 3.5 4.2 4.1  CL 105 103 102 103 102 107 104  CO2 28 26 24 26 26 26 24   GLUCOSE 94 92 85 106* 107* 111* 88  BUN 42* 41* 44* 53* 56* 54* 53*  CREATININE 1.24* 1.08* 0.96 1.20* 1.01* 0.96 0.88  CALCIUM 8.2* 8.4* 8.7* 9.1 9.2 9.0 8.8*  MG 2.7* 2.6* 2.4 2.5*  --   --   --    GFR: Estimated Creatinine Clearance: 48.2 mL/min (by C-G formula based on SCr of 0.88 mg/dL). Liver Function Tests: Recent Labs  Lab 07/19/20 0944  AST 31  ALT 19  ALKPHOS 115  BILITOT 0.5  PROT 7.3  ALBUMIN 3.6   No results for input(s): LIPASE, AMYLASE in the last 168 hours. No results for input(s): AMMONIA in the last 168 hours. Coagulation Profile: No results for input(s):  INR, PROTIME in the last 168 hours. Cardiac Enzymes: No results for input(s): CKTOTAL, CKMB, CKMBINDEX, TROPONINI in the last 168 hours. BNP (last 3 results) No results for input(s): PROBNP in the last 8760 hours. HbA1C: No results for input(s): HGBA1C in the last 72 hours. CBG: Recent Labs  Lab 07/20/20 2257 07/21/20 0541 07/21/20 2318 07/22/20 0642  GLUCAP 105* 97 95 88   Lipid Profile: No results for input(s): CHOL, HDL, LDLCALC, TRIG, CHOLHDL, LDLDIRECT in the last 72 hours. Thyroid Function Tests: No results for input(s): TSH, T4TOTAL, FREET4, T3FREE, THYROIDAB in the last 72 hours. Anemia Panel: No results for input(s): VITAMINB12, FOLATE, FERRITIN, TIBC, IRON, RETICCTPCT in the last 72 hours. Sepsis Labs: Recent Labs  Lab 07/25/20 2053 07/26/20 0419  PROCALCITON <0.10 <0.10    Recent  Results (from the past 240 hour(s))  Resp Panel by RT-PCR (Flu A&B, Covid) Nasopharyngeal Swab     Status: None   Collection Time: 07/19/20  3:16 PM   Specimen: Nasopharyngeal Swab; Nasopharyngeal(NP) swabs in vial transport medium  Result Value Ref Range Status   SARS Coronavirus 2 by RT PCR NEGATIVE NEGATIVE Final    Comment: (NOTE) SARS-CoV-2 target nucleic acids are NOT DETECTED.  The SARS-CoV-2 RNA is generally detectable in upper respiratory specimens during the acute phase of infection. The lowest concentration of SARS-CoV-2 viral copies this assay can detect is 138 copies/mL. A negative result does not preclude SARS-Cov-2 infection and should not be used as the sole basis for treatment or other patient management decisions. A negative result may occur with  improper specimen collection/handling, submission of specimen other than nasopharyngeal swab, presence of viral mutation(s) within the areas targeted by this assay, and inadequate number of viral copies(<138 copies/mL). A negative result must be combined with clinical observations, patient history, and epidemiological information. The expected result is Negative.  Fact Sheet for Patients:  EntrepreneurPulse.com.au  Fact Sheet for Healthcare Providers:  IncredibleEmployment.be  This test is no t yet approved or cleared by the [place] FDA and  has been authorized for detection and/or diagnosis of SARS-CoV-2 by FDA under an Emergency Use Authorization (EUA). This EUA will remain  in effect (meaning this test can be used) for the duration of the COVID-19 declaration under Section 564(b)(1) of the Act, 21 U.S.C.section 360bbb-3(b)(1), unless the authorization is terminated  or revoked sooner.       Influenza A by PCR NEGATIVE NEGATIVE Final   Influenza B by PCR NEGATIVE NEGATIVE Final    Comment: (NOTE) The Xpert Xpress SARS-CoV-2/FLU/RSV plus assay is intended as an aid in the  diagnosis of influenza from Nasopharyngeal swab specimens and should not be used as a sole basis for treatment. Nasal washings and aspirates are unacceptable for Xpert Xpress SARS-CoV-2/FLU/RSV testing.  Fact Sheet for Patients: EntrepreneurPulse.com.au  Fact Sheet for Healthcare Providers: IncredibleEmployment.be  This test is not yet approved or cleared by the [place] FDA and has been authorized for detection and/or diagnosis of SARS-CoV-2 by FDA under an Emergency Use Authorization (EUA). This EUA will remain in effect (meaning this test can be used) for the duration of the COVID-19 declaration under Section 564(b)(1) of the Act, 21 U.S.C. section 360bbb-3(b)(1), unless the authorization is terminated or revoked.  Performed at [facility], [address] [place], [place] [address]   Urine Culture     Status: None   Collection Time: 07/21/20  6:47 AM   Specimen: Urine, Random  Result Value Ref Range Status   Specimen  Description   Final    URINE, RANDOM Performed at [facility], [address], [place], [place] [address]    Special Requests   Final    NONE Performed at [facility], [address] [place], [place] [address]    Culture   Final    NO GROWTH Performed at [facility], [place] [address], [place], [place] [address]    Report Status 07/23/2020 FINAL  Final         Radiology Studies: ECHOCARDIOGRAM LIMITED  Result Date: 07/24/2020    ECHOCARDIOGRAM LIMITED REPORT   Patient Name:   Dana Perez Date of Exam: 07/24/2020 Medical Rec #:  [id number]         Height:       65.0 in Accession #:    [id number]        Weight:       134.0 lb Date of Birth:  [birth year]         BSA:          1.669 m Patient Age:    47 years          BP:           156/70 mmHg Patient Gender: F                 HR:           108 bpm. Exam Location:  [facility] Procedure: Limited Echo, Limited Color Doppler and  Cardiac Doppler Indications:     I31.3 Pericardial Effusion  History:         Patient has prior history of Echocardiogram examinations, most                  recent 07/20/2020. Risk Factors:HCL, Dyslipidemia and                  Hypertension.  Sonographer:     Charmayne Sheer RDCS (AE) Referring Phys:  [id number] [facility] Diagnosing Phys: Bartholome Bill MD  Sonographer Comments: Technically difficult study due to poor echo windows. IMPRESSIONS  1. Left ventricular ejection fraction, by estimation, is 50 to 55%. The left ventricle has low normal function. The left ventricle has no regional wall motion abnormalities. Left ventricular diastolic parameters were normal.  2. Right ventricular systolic function is normal. The right ventricular size is mildly enlarged.  3. Moderate pericardial effusion. There is no evidence of cardiac tamponade.  4. The mitral valve was not well visualized. Trivial mitral valve regurgitation.  5. The aortic valve was not well visualized. Aortic valve regurgitation is not visualized. FINDINGS  Left Ventricle: Left ventricular ejection fraction, by estimation, is 50 to 55%. The left ventricle has low normal function. The left ventricle has no regional wall motion abnormalities. The left ventricular internal cavity size was normal in size. There is no left ventricular hypertrophy. Left ventricular diastolic parameters were normal. Right Ventricle: The right ventricular size is mildly enlarged. No increase in right ventricular wall thickness. Right ventricular systolic function is normal. Pericardium: A moderately sized pericardial effusion is present. There is no evidence of cardiac tamponade. Mitral Valve: The mitral valve was not well visualized. Trivial mitral valve regurgitation. Tricuspid Valve: The tricuspid valve is not well visualized. Tricuspid valve regurgitation is trivial. Aortic Valve: The aortic valve was not well visualized. Aortic valve regurgitation is not visualized. Pulmonic  Valve: The pulmonic valve was not well visualized. LEFT VENTRICLE PLAX 2D LVIDd:  3.99 cm LVIDs:         3.26 cm LV PW:         0.96 cm LV IVS:        0.97 cm  Bartholome Bill MD Electronically signed by Bartholome Bill MD Signature Date/Time: 07/24/2020/3:50:25 PM    Final         Scheduled Meds: . azithromycin  500 mg Oral Daily  . bacitracin  1 application Topical QODAY  . budesonide (PULMICORT) nebulizer solution  0.25 mg Nebulization BID  . cholestyramine  4 g Per Tube TID WC  . clotrimazole   Topical QODAY  . enoxaparin (LOVENOX) injection  40 mg Subcutaneous Q24H  . feeding supplement  237 mL Per Tube 5 X Daily  . feeding supplement (PROSource TF)  45 mL Per Tube BID  . fentaNYL  1 patch Transdermal Q72H  . free water  100 mL Per Tube 5 X Daily  . ipratropium-albuterol  3 mL Nebulization Q4H while awake  . mirtazapine  15 mg Oral QHS  . pravastatin  10 mg Per Tube QHS  . predniSONE  40 mg Oral Q breakfast  . sodium chloride flush  3 mL Intravenous Q12H   Continuous Infusions: . sodium chloride 250 mL (07/23/20 2059)    Assessment & Plan:   Principal Problem:   Acute respiratory failure (HCC) Active Problems:   Essential hypertension   Squamous cell carcinoma of head and neck (HCC)   CKD (chronic kidney disease) stage 3, GFR 30-59 ml/min   Acute CHF (congestive heart failure) (HCC)   COPD (chronic obstructive pulmonary disease) (HCC)   Acute lower UTI   Acute Hypoxic Respiratory Failure - acute CHF vs acute COPD  or combo in the setting of limited respiratory reserv 12/9-on high flow nasal cannula between 5 to 6 L . CT scan showed new lung metastasis and no PE  Husband would like her to be more medically stable prior to discharge  Pulmonary was consulted input was appreciated -chest PT with respiratory therapist Sputum cx Continue nebs, azithromycin Cardiology decreasing IV Lasix to 20 mg once daily Continue steroid      Acute diastolic CHF/moderate  pericardial effusion:  Cards following.  Grade I diastolic dysfunction and ef 50-55% Improving slowly, more euvolemic See above    Suspected UTI on admission: Urine cultures shows no growth. -Status post 5 days of IV Rocephin    Stage IV head and neck cancer  - Follow up with Heme/Onc outpatient. 12/9-palliative care discussed with patient, plan for patient to go home with hospice once medically more stable Try to wean O2 down to 2 to 4 L [facility] has been contacted    Severe Protein Calorie Malnutrition / Oropharyngeal Dysphagia s/p 2019 PEG tube placement -Continue nutrition via PEG tube  Exchanges every 3 months    CKD Stage 3a Creatinine remained stable. Continue to monitor while on Lasix  Hypertension - BP stable.  Hyperlipidemia - Statin  Insomnia - Mirtazepine nightly.  Hearing Impairment  Nutrition Problem: Severe Malnutrition Etiology: cancer and cancer related treatments, other (see comment) (COPD) Signs/Symptoms: moderate fat depletion, severe fat depletion, severe muscle depletion Body mass index is 22.31 kg/m.    DVT prophylaxis: Lovenox Code Status: Full Family Communication: Husband at bedside, updated  Status is: Inpatient  Remains inpatient appropriate because:Inpatient level of care appropriate due to severity of illness   Dispo: The patient is from: Home  Anticipated d/c is to: Home              Anticipated d/c date is: 2 days              Patient currently is not medically stable to d/c.getting iv lasix. On HFNC.need to wean down 02 to 2-4L            LOS: 7 days   Time spent: 35 minutes with more than 50% on [place], MD [facility] Pager [phone]  If 7PM-7AM, please contact night-coverage 07/26/2020, 8:37 AM

## 2020-07-26 NOTE — Progress Notes (Signed)
[facility] Liaison note: New referral for [employer] [facility] at home received from Palliative NP Christoval. TOC Awanya Caesar is aware. Patient information given to referral. Hospice eligibility pending review.  Writer spoke in the room with patient and her husband to initiate education regarding hospice services, philosophy and team approach to care with understanding voiced. DME needs discussed. Oxygen and hospital bed have been ordered for delivery tomorrow 12/10.  Patient remains on 6 liters of oxygen with plan per Palliative NP and Mr. Hulgan to wean down to 2-4 liters. Hospice information and contact numbers given to Mr. Gossett. Patient will require non emergent transport at discharge with signed out of facility DNR in place. Liaison will continue to follow an [place] updated regarding DME and discharge transportation. d assist with discharge needs. Thank you for the opportunity to be involved in the care of this patient and her family.  Flo Shanks BSN, RN, [place] [phone]

## 2020-07-27 LAB — BASIC METABOLIC PANEL
Anion gap: 11 (ref 5–15)
BUN: 55 mg/dL — ABNORMAL HIGH (ref 8–23)
CO2: 23 mmol/L (ref 22–32)
Calcium: 9 mg/dL (ref 8.9–10.3)
Chloride: 104 mmol/L (ref 98–111)
Creatinine, Ser: 0.86 mg/dL (ref 0.44–1.00)
GFR, Estimated: >60 mL/min (ref >60)
Glucose, Bld: 93 mg/dL (ref 70–99)
Potassium: 4.3 mmol/L (ref 3.5–5.1)
Sodium: 138 mmol/L (ref 135–145)

## 2020-07-27 LAB — ASPERGILLUS ANTIGEN, BAL/SERUM: Aspergillus Ag, BAL/Serum: 0.03 Index (ref 0.00–0.49)

## 2020-07-27 LAB — PROCALCITONIN: Procalcitonin: <0.1 ng/mL

## 2020-07-27 MED ORDER — ALBUTEROL SULFATE HFA 108 (90 BASE) MCG/ACT IN AERS
1.0000 | INHALATION_SPRAY | RESPIRATORY_TRACT | 1 refills | Status: AC | PRN
Start: 2020-07-27 — End: ?

## 2020-07-27 MED ORDER — BENZOCAINE 10 % MT GEL: Freq: Two times a day (BID) | OROMUCOSAL | 0 refills | Status: AC | PRN

## 2020-07-27 NOTE — Progress Notes (Signed)
[facility]  Patient Description: Dana Perez is a 79 y/o female with PMH significant for HTN,  CKD Stage III, COPD, progressive stage Iva head and neck squamous cell carcinoma and adenocarcinoma of the colon who was admitted for acute respiratory failure. Cardiology consulted for possible acute CHF.   SUBJECTIVE: The patient reports to be doing well on today and states that she is ready for discharge. The patient denies having any chest pain or dyspnea at this time.   07/26/20: The patient reports to be doing better on today. She denies having any chest pain or dyspnea at this time. She reports that her abdominal pain has resolved also.   07/25/20: The patient reports to be doing "alright" on today but she c/o abdominal pain. The patient denies having any chest pain and states that her breathing has much improved.  07/24/20: Today the patient reports that she is not feeling well. She denies having any chest pain or dyspnea. Her husband is at the bedside and reports that the patient states "she said that she is not in pain, she just does not feel well."   12/6//21: The patient reports to be doing okay and she denies having any chest pain or worsening dyspnea at this time. The husband is at the bedside and reports that the patient has had good output and has not complained of any pain or edema.   OBJECTIVE: The patient appears to be feeling much better on today. She continues to be in NSR, appears euvolemic and is in no apparent distress. Patient is awaiting discharge.   07/26/20:The patient appears slightly better on today and she is currently receiving a nebulizer treatment. Her bedside ECG continues to reveal NSR with a HR of 81 bpm and she continues to appear euvolemic.   07/25/20: The patient was more communicative on today and appears slightly better compared to yesterday. Her bedside ECG reveals ST likely due to her recent nebulizer treatment and she remains asymptomatic. She appears euvolemic  and her recent echocardiogram continues to reveal moderate pericardial effusion without evidence of tamponade.  Her oxygen has been weaned to 6L via [place] and she her oxygen saturation is at 89%.   07/25/20: The patient is resting in bed and is less communicative on today. Her bedside ECG reveals NSR with a HR of 93 bpm and she appears to be euvolemic.   07/23/20: The patient did not speak much, appears chronically ill and is no apparent distress. She is resting comfortably in bed, on supplemental oxygen with normal effort of breathing. She is in NSR with HR of 97 bpm on the bedside monitor and she appears euvolemic.   Vitals:   07/27/20 0300 07/27/20 0724 07/27/20 0849 07/27/20 1100  BP: 125/68 134/79  (!) 126/58  Pulse: 90 84  87  Resp:  16  16  Temp: 97.9 F (36.6 C) 97.9 F (36.6 C)  98.3 F (36.8 C)  TempSrc:  Oral  Oral  SpO2: 95% 94% 91% (!) 89%  Weight: 61.4 kg     Height:         Intake/Output Summary (Last 24 hours) at 07/27/2020 1303 Last data filed at 07/27/2020 1122 Gross per 24 hour  Intake --  Output 1900 ml  Net -1900 ml      PHYSICAL EXAM  General: appears chronically ill,  in no acute distress HEENT:  Normocephalic and atraumatic.  Neck:  No JVD. Negative for rigidity.  Lungs: Clear bilaterally to upper lobes, diminished in lower  lobes upon auscultation. Chest expansion appears symmetrical. Normal effort of breathing.  Heart: HRRR . Normal S1 and S2 without gallops or murmurs.  Abdomen: Bowel sounds are positive, abdomen soft and non-tender  Msk: Decreased strength and tone for age. Extremities: No clubbing, cyanosis or edema.  +2 peripheral pulses.  Neuro: Alert and oriented X 2. Psych:  Good affect, responds appropriately   LABS: Basic Metabolic Panel: Recent Labs    07/26/20 0419 07/27/20 0650  NA 136 138  K 4.1 4.3  CL 104 104  CO2 24 23  GLUCOSE 88 93  BUN 53* 55*  CREATININE 0.88 0.86  CALCIUM 8.8* 9.0   Liver Function Tests: No results  for input(s): AST, ALT, ALKPHOS, BILITOT, PROT, ALBUMIN in the last 72 hours. No results for input(s): LIPASE, AMYLASE in the last 72 hours. CBC: Recent Labs    07/25/20 0454  WBC 9.6  HGB 15.1*  HCT 47.2*  MCV 92.7  PLT 357   Cardiac Enzymes: No results for input(s): CKTOTAL, CKMB, CKMBINDEX, TROPONINI in the last 72 hours. BNP: Invalid input(s): POCBNP D-Dimer: No results for input(s): DDIMER in the last 72 hours. Hemoglobin A1C: No results for input(s): HGBA1C in the last 72 hours. Fasting Lipid Panel: No results for input(s): CHOL, HDL, LDLCALC, TRIG, CHOLHDL, LDLDIRECT in the last 72 hours. Thyroid Function Tests: No results for input(s): TSH, T4TOTAL, T3FREE, THYROIDAB in the last 72 hours.  Invalid input(s): FREET3 Anemia Panel: No results for input(s): VITAMINB12, FOLATE, FERRITIN, TIBC, IRON, RETICCTPCT in the last 72 hours.  No results found.   Echo: IMPRESSIONS  1. Left ventricular ejection fraction, by estimation, is 50 to 55%. The  left ventricle has low normal function. The left ventricle has no regional  wall motion abnormalities. Left ventricular diastolic parameters were  normal.  2. Right ventricular systolic function is normal. The right ventricular  size is mildly enlarged.  3. Moderate pericardial effusion. There is no evidence of cardiac  tamponade.  4. The mitral valve was not well visualized. Trivial mitral valve  regurgitation.  5. The aortic valve was not well visualized. Aortic valve regurgitation  is not visualized.   TELEMETRY: NSR with 81 bpm  ASSESSMENT AND PLAN:  Principal Problem:   Acute respiratory failure (HCC) Active Problems:   Essential hypertension   Squamous cell carcinoma of head and neck (HCC)   CKD (chronic kidney disease) stage 3, GFR 30-59 ml/min   Acute CHF (congestive heart failure) (HCC)   COPD (chronic obstructive pulmonary disease) (HCC)   Acute lower UTI   Palliative care encounter    1. Acute CHF,  stable, grade I diastolic dysfunction, patient appears euvolemic   -Recommend continuing oral lasix 20mg  once daily.   - Invasive Cardiac studies are not recommended at this time.  -Monitor strict I&O's, follow low sodium diet and perform daily weights until discharge.   -Continuous telemetry monitoring until discharge.    -Recommend follow up with cardiology within 1 week of discharge.   -Cardiology is signing off at this time.   2. Moderate pericardial effusion, stable  -No evidence of tamponade.   -We will continue conservative management.   3. Acute respiratory failure, fairly stable  -Agree with current management.  -RT consult appreciated.   4. HTN, controlled, patient is normotensive  -Recommend contiuing to hold antihypertensive medications at this time in the presence of lower blood pressures.   5. CKD stage III, fairly stable  -agree with current therapy.  - recommend decreasing lasix.  6. Squamous cell carcinoma of head and neck  -Management per Oncology appreciated.   -Palliative care consult appreciated.   Cardiology is signing off on this patient at this time.   The patient's history, physical exam findings and plan of care were all discussed with Dr. Karma Greaser D. [place], whom also evaluated the patient, and all decision making was made in collaboration with him.     [place], ACNPC-AG  07/27/2020 1:03 PM

## 2020-07-27 NOTE — TOC Transition Note (Signed)
Transition of Care [facility]) - CM/SW Discharge Note   Patient Details  Name: Dana Perez MRN: [id number] Date of Birth: 08/09/1941  [facility] CM/SW Contact:  Kerin Salen, RN Phone Number: 07/27/2020, 1:22 PM    Final next level of care: Home w Hospice Care Barriers to Discharge: Barriers Resolved   Patient Goals and CMS Choice Patient states their goals for this hospitalization and ongoing recovery are:: Return home.      Discharge Placement                Patient to be transferred to facility by: EMS-Non-Emergency Name of family member notified: Husband Patient and family notified of of transfer: 07/27/20  Discharge Plan and Services                DME Arranged: Hospice Equipment Package D DME Agency: Other - Comment ([place]) Date DME Agency Contacted: 07/27/20   Representative spoke with at DME Agency: Kieth Brightly [phone] [facility] Arranged: NA [place] Agency: NA        Social Determinants of Health ([place]) Interventions     Readmission Risk Interventions No flowsheet data found.

## 2020-07-27 NOTE — Plan of Care (Signed)

## 2020-07-27 NOTE — Discharge Summary (Signed)
Dana Perez XKG:[id number] DOB: [birth year] DOA: 07/19/2020  PCP: [name], MD  Admit date: 07/19/2020 Discharge date: 07/27/2020  Admitted From: Home Disposition: Home with hospice  Recommendations for Outpatient Follow-up:  1. Follow up with PCP in 1 week 2. 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  AcuteHypoxic Respiratory Failure - acute CHF vs acute COPD/new lung mets or combo in the setting of limited respiratory reserve CT scan showed new lung metastasis and no PE  Pulmonary was consulted input was appreciated -chest PT with respiratory therapist On neb /MDI and azithromycin Cardiology consulted, started pt on iv lasix.  Patient 02 requirement down to 4-5L.  Palliative care was consulted, patient was DNR now, and husband agreed to hospice . Husband agreeable with pt going home with hospice today and understands it is not necessary to return to hospital if she becomes worse from respiratory stand point and that hospice will keep her comfortable. He is agreeable with this plan       Acute diastolic CHF/moderate pericardial effusion:  Cards following.  Grade I diastolic dysfunction and ef 50-55% More euvolemic with diuresis.    Suspected UTI on admission:  Urine cultures shows no growth. -Status post 5 days of IV Rocephin    Stage IV head and neck cancer Can f/u with heme as outpt Going home with hospice      Severe Protein Calorie Malnutrition / Oropharyngeal Dysphagia s/p 2019 PEG tube placement -Continue nutrition via PEG tube  Exchanges every 3 months    CKDStage 3a Creatinine remained stable.   Hypertension - BP stable.  Hyperlipidemia - Statin  Insomnia - Mirtazepine nightly   Discharge Diagnoses:  Principal Problem:   Acute respiratory failure (HCC) Active Problems:   Essential hypertension   Squamous cell carcinoma of head and neck (HCC)   CKD (chronic kidney disease) stage 3, GFR 30-59 ml/min   Acute CHF (congestive heart failure) (HCC)   COPD (chronic obstructive pulmonary disease) (HCC)   Acute lower UTI   Palliative care encounter    Discharge Instructions  Discharge Instructions    Diet - low sodium heart healthy   Complete by: As directed    Increase activity slowly   Complete by: As directed      Allergies as of 07/27/2020      Reactions   Buprenorphine Nausea Only, Nausea And Vomiting   Other Other (See Comments), Rash   TDAP tdap TDAP   Tetanus Antitoxin Rash      Medication List    TAKE these medications   acetaminophen 500 MG tablet Commonly known as: TYLENOL Take 1,000 mg by mouth 2 (two) times daily as needed. 1000, 1600   albuterol 108 (90 Base) MCG/ACT inhaler Commonly known as: VENTOLIN HFA Inhale 1-2 puffs into the lungs every 4 (four) hours as needed for up to 8 doses for wheezing  or shortness of breath.   bacitracin ointment Apply 1 application topically 2 (two) times daily as needed.   benzocaine 10 % mucosal gel Commonly known as: ORAJEL Use as directed in the mouth or throat 2 (two) times daily as needed for mouth pain.   chlorpheniramine-HYDROcodone 10-8 MG/5ML Suer Commonly known as: TUSSIONEX Place 5 mLs into feeding tube every 12 (twelve) hours  as needed for cough. What changed: additional instructions   cholestyramine 4 GM/DOSE powder Commonly known as: QUESTRAN Take 1 packet (4 g total) by mouth 3 (three) times daily with meals.   clotrimazole-betamethasone cream Commonly known as: LOTRISONE Apply to when changing dressing   Ensure Plus Liqd Give 1 carton of ensure plus 5 times daily via feeding tube.  Flush with 77m of water before and after feeding.   fentaNYL 50 MCG/HR Commonly known as: [place]1 patch onto the skin every 3 (three) days.   hydrOXYzine 10 MG tablet Commonly known as: ATARAX/VISTARIL Place 10 mg into feeding tube 2 (two) times daily as needed.   mirtazapine 15 MG tablet Commonly known as: Remeron Take 1 tablet (15 mg total) by mouth at bedtime. Crush and administer via feeding tube.   morphine CONCENTRATE 10 mg / 0.5 ml concentrated solution Place 0.25 mLs (5 mg total) into feeding tube every 4 (four) hours as needed for moderate pain or severe pain. What changed: additional instructions   Narcan 4 MG/0.1ML Liqd nasal spray kit Generic drug: naloxone USE 1 SPRAY PER INTRANASAL ROUTE AS DIRECTED   ondansetron 8 MG tablet Commonly known as: ZOFRAN Place 1 tablet (8 mg total) into feeding tube every 8 (eight) hours as needed for nausea or vomiting.   pravastatin 10 MG tablet Commonly known as: PRAVACHOL Place 10 mg into feeding tube daily.   prochlorperazine 10 MG tablet Commonly known as: COMPAZINE PLACE 1 TABLET (10 MG TOTAL) INTO FEEDING TUBE EVERY 6 (SIX) HOURS AS NEEDED FOR NAUSEA OR VOMITING.       Allergies  Allergen Reactions  . Buprenorphine Nausea Only and Nausea And Vomiting  . Other Other (See Comments) and Rash    TDAP tdap TDAP  . Tetanus Antitoxin Rash    Consultations:  Oncology, cardiology, pccm, palliative care   Procedures/Studies: DG Chest Port 1 View  Result Date: 07/19/2020 CLINICAL DATA:  79year old female with shortness of breath. EXAM:  PORTABLE CHEST 1 VIEW COMPARISON:  Chest radiograph dated 11/15/2017. FINDINGS: There is cardiomegaly with vascular congestion and edema. Small bilateral pleural effusions, right greater left, with bibasilar atelectasis. Pneumonia is not excluded clinical correlation is recommended. No pneumothorax. Atherosclerotic calcification of the aorta. No acute osseous pathology. IMPRESSION: Cardiomegaly with findings of CHF and small bilateral pleural effusions. Pneumonia is not excluded. Electronically Signed   By: AAnner CreteM.D.   On: 07/19/2020 15:32   ECHOCARDIOGRAM COMPLETE  Result Date: 07/20/2020    ECHOCARDIOGRAM REPORT   Patient Name:   Dana BAGENTDate of Exam: 07/20/2020 Medical Rec #:  [id number]        Height:       65.0 in Accession #:    [id number]       Weight:       138.0 lb Date of Birth:  4Dec 14, 1942        BSA:          1.690 m Patient Age:    [age]years          BP:  157/76 mmHg Patient Gender: F                 HR:           96 bpm. Exam Location:  [facility] Procedure: 2D Echo, Cardiac Doppler and Color Doppler Indications:     CHF-acute diastolic 037.09  History:         Patient has no prior history of Echocardiogram examinations.                  Risk Factors:Dyslipidemia.  Sonographer:     Sherrie Sport RDCS (AE) Referring Phys:  [id number] Royce Macadamia AGBATA Diagnosing Phys: Yolonda Kida MD  Sonographer Comments: Technically challenging study due to limited acoustic windows, no apical window and no subcostal window. IMPRESSIONS  1. Left ventricular ejection fraction, by estimation, is 65 to 70%. The left ventricle has normal function. The left ventricle has no regional wall motion abnormalities. There is mild left ventricular hypertrophy. Left ventricular diastolic parameters are consistent with Grade I diastolic dysfunction (impaired relaxation).  2. Right ventricular systolic function is mildly reduced. The right ventricular size is moderately enlarged.  3. Moderate pericardial  effusion.  4. The mitral valve is grossly normal. Mild mitral valve regurgitation.  5. The aortic valve is normal in structure. Aortic valve regurgitation is trivial. FINDINGS  Left Ventricle: Left ventricular ejection fraction, by estimation, is 65 to 70%. The left ventricle has normal function. The left ventricle has no regional wall motion abnormalities. The left ventricular internal cavity size was normal in size. There is  mild left ventricular hypertrophy. Left ventricular diastolic parameters are consistent with Grade I diastolic dysfunction (impaired relaxation). Right Ventricle: The right ventricular size is moderately enlarged. No increase in right ventricular wall thickness. Right ventricular systolic function is mildly reduced. Left Atrium: Left atrial size was normal in size. Right Atrium: Right atrial size was normal in size. Pericardium: A moderately sized pericardial effusion is present. Mitral Valve: The mitral valve is grossly normal. Mild mitral valve regurgitation. Tricuspid Valve: The tricuspid valve is grossly normal. Tricuspid valve regurgitation is mild. Aortic Valve: The aortic valve is normal in structure. Aortic valve regurgitation is trivial. Pulmonic Valve: The pulmonic valve was grossly normal. Pulmonic valve regurgitation is not visualized. Aorta: The aortic arch was not well visualized. IAS/Shunts: No atrial level shunt detected by color flow Doppler.  LEFT VENTRICLE PLAX 2D LVIDd:         3.82 cm LVIDs:         2.48 cm LV PW:         0.92 cm LV IVS:        1.11 cm LVOT diam:     2.00 cm LVOT Area:     3.14 cm  LEFT ATRIUM         Index LA diam:    2.30 cm 1.36 cm/m                        PULMONIC VALVE AORTA                 PV Vmax:        0.67 m/s Ao Root diam: 2.30 cm PV Peak grad:   1.8 mmHg                       RVOT Peak grad: 2 mmHg  TRICUSPID VALVE TR Peak grad:   23.6 mmHg TR Vmax:  243.00 cm/s  SHUNTS Systemic Diam: 2.00 cm Yolonda Kida MD Electronically signed  by Yolonda Kida MD Signature Date/Time: 07/20/2020/3:01:26 PM    Final    ECHOCARDIOGRAM LIMITED  Result Date: 07/24/2020    ECHOCARDIOGRAM LIMITED REPORT   Patient Name:   Dana Perez Date of Exam: 07/24/2020 Medical Rec #:  [id number]         Height:       65.0 in Accession #:    [id number]        Weight:       134.0 lb Date of Birth:  [birth year]         BSA:          1.669 m Patient Age:    25 years          BP:           156/70 mmHg Patient Gender: F                 HR:           108 bpm. Exam Location:  [facility] Procedure: Limited Echo, Limited Color Doppler and Cardiac Doppler Indications:     I31.3 Pericardial Effusion  History:         Patient has prior history of Echocardiogram examinations, most                  recent 07/20/2020. Risk Factors:HCL, Dyslipidemia and                  Hypertension.  Sonographer:     Charmayne Sheer RDCS (AE) Referring Phys:  [id number] [facility] MASOUD Diagnosing Phys: Bartholome Bill MD  Sonographer Comments: Technically difficult study due to poor echo windows. IMPRESSIONS  1. Left ventricular ejection fraction, by estimation, is 50 to 55%. The left ventricle has low normal function. The left ventricle has no regional wall motion abnormalities. Left ventricular diastolic parameters were normal.  2. Right ventricular systolic function is normal. The right ventricular size is mildly enlarged.  3. Moderate pericardial effusion. There is no evidence of cardiac tamponade.  4. The mitral valve was not well visualized. Trivial mitral valve regurgitation.  5. The aortic valve was not well visualized. Aortic valve regurgitation is not visualized. FINDINGS  Left Ventricle: Left ventricular ejection fraction, by estimation, is 50 to 55%. The left ventricle has low normal function. The left ventricle has no regional wall motion abnormalities. The left ventricular internal cavity size was normal in size. There is no left ventricular hypertrophy. Left ventricular diastolic parameters were  normal. Right Ventricle: The right ventricular size is mildly enlarged. No increase in right ventricular wall thickness. Right ventricular systolic function is normal. Pericardium: A moderately sized pericardial effusion is present. There is no evidence of cardiac tamponade. Mitral Valve: The mitral valve was not well visualized. Trivial mitral valve regurgitation. Tricuspid Valve: The tricuspid valve is not well visualized. Tricuspid valve regurgitation is trivial. Aortic Valve: The aortic valve was not well visualized. Aortic valve regurgitation is not visualized. Pulmonic Valve: The pulmonic valve was not well visualized. LEFT VENTRICLE PLAX 2D LVIDd:         3.99 cm LVIDs:         3.26 cm LV PW:         0.96 cm LV IVS:        0.97 cm  Bartholome Bill MD Electronically signed by Bartholome Bill MD Signature Date/Time: 07/24/2020/3:50:25 PM    Final  Subjective: Sitting in bed on 02. Does not interact too much. Does not complain of pain to me today  Discharge Exam: Vitals:   07/27/20 0849 07/27/20 1100  BP:  (!) 126/58  Pulse:  87  Resp:  16  Temp:  98.3 F (36.8 C)  SpO2: 91% (!) 89%   Vitals:   07/27/20 0300 07/27/20 0724 07/27/20 0849 07/27/20 1100  BP: 125/68 134/79  (!) 126/58  Pulse: 90 84  87  Resp:  16  16  Temp: 97.9 F (36.6 C) 97.9 F (36.6 C)  98.3 F (36.8 C)  TempSrc:  Oral  Oral  SpO2: 95% 94% 91% (!) 89%  Weight: 61.4 kg     Height:        General: Pt is alert, awake, not in acute distress Cardiovascular: RRR, S1/S2 +, no rubs, no gallops Respiratory: decrease bs at bases, no wheezing Abdominal: Soft, NT, ND, bowel sounds + Extremities: no edema, no cyanosis    The results of significant diagnostics from this hospitalization (including imaging, microbiology, ancillary and laboratory) are listed below for reference.     Microbiology: Recent Results (from the past 240 hour(s))  Resp Panel by RT-PCR (Flu A&B, Covid) Nasopharyngeal Swab     Status: None    Collection Time: 07/19/20  3:16 PM   Specimen: Nasopharyngeal Swab; Nasopharyngeal(NP) swabs in vial transport medium  Result Value Ref Range Status   SARS Coronavirus 2 by RT PCR NEGATIVE NEGATIVE Final    Comment: (NOTE) SARS-CoV-2 target nucleic acids are NOT DETECTED.  The SARS-CoV-2 RNA is generally detectable in upper respiratory specimens during the acute phase of infection. The lowest concentration of SARS-CoV-2 viral copies this assay can detect is 138 copies/mL. A negative result does not preclude SARS-Cov-2 infection and should not be used as the sole basis for treatment or other patient management decisions. A negative result may occur with  improper specimen collection/handling, submission of specimen other than nasopharyngeal swab, presence of viral mutation(s) within the areas targeted by this assay, and inadequate number of viral copies(<138 copies/mL). A negative result must be combined with clinical observations, patient history, and epidemiological information. The expected result is Negative.  Fact Sheet for Patients:  EntrepreneurPulse.com.au  Fact Sheet for Healthcare Providers:  IncredibleEmployment.be  This test is no t yet approved or cleared by the [place] FDA and  has been authorized for detection and/or diagnosis of SARS-CoV-2 by FDA under an Emergency Use Authorization (EUA). This EUA will remain  in effect (meaning this test can be used) for the duration of the COVID-19 declaration under Section 564(b)(1) of the Act, 21 U.S.C.section 360bbb-3(b)(1), unless the authorization is terminated  or revoked sooner.       Influenza A by PCR NEGATIVE NEGATIVE Final   Influenza B by PCR NEGATIVE NEGATIVE Final    Comment: (NOTE) The Xpert Xpress SARS-CoV-2/FLU/RSV plus assay is intended as an aid in the diagnosis of influenza from Nasopharyngeal swab specimens and should not be used as a sole basis for treatment.  Nasal washings and aspirates are unacceptable for Xpert Xpress SARS-CoV-2/FLU/RSV testing.  Fact Sheet for Patients: EntrepreneurPulse.com.au  Fact Sheet for Healthcare Providers: IncredibleEmployment.be  This test is not yet approved or cleared by the [place] FDA and has been authorized for detection and/or diagnosis of SARS-CoV-2 by FDA under an Emergency Use Authorization (EUA). This EUA will remain in effect (meaning this test can be used) for the duration of the COVID-19 declaration under Section 564(b)(1) of the  Act, 21 U.S.C. section 360bbb-3(b)(1), unless the authorization is terminated or revoked.  Performed at [facility], [address] [place], [place] [address]   Urine Culture     Status: None   Collection Time: 07/21/20  6:47 AM   Specimen: Urine, Random  Result Value Ref Range Status   Specimen Description   Final    URINE, RANDOM Performed at [facility], [address], [place], [place] [address]    Special Requests   Final    NONE Performed at [facility], [address], [place], [place] [address]    Culture   Final    NO GROWTH Performed at [facility], [place] [address] [place], [place] [address]    Report Status 07/23/2020 FINAL  Final     Labs: BNP (last 3 results) Recent Labs    07/19/20 1517 07/22/20 0625  BNP 521.0* 101.7*   Basic Metabolic Panel: Recent Labs  Lab 07/21/20 0619 07/22/20 0625 07/23/20 0508 07/24/20 0553 07/25/20 0454 07/26/20 0419 07/27/20 0650  NA 141 139 140 139 143 136 138  K 3.5 3.5 3.3* 3.5 4.2 4.1 4.3  CL 103 102 103 102 107 104 104  CO2 _0 GLUCOSE 92 85 106* 107* 111* 88 93  BUN 41* 44* 53* 56* 54* 53* 55*  CREATININE 1.08* 0.96 1.20* 1.01* 0.96 0.88 0.86  CALCIUM 8.4* 8.7* 9.1 9.2 9.0 8.8* 9.0  MG 2.6* 2.4 2.5*  --   --   --   --    Liver Function Tests: No results for input(s): AST, ALT, ALKPHOS,  BILITOT, PROT, ALBUMIN in the last 168 hours. No results for input(s): LIPASE, AMYLASE in the last 168 hours. No results for input(s): AMMONIA in the last 168 hours. CBC: Recent Labs  Lab 07/22/20 0625 07/24/20 0553 07/25/20 0454  WBC 8.1 8.5 9.6  NEUTROABS 6.1  --   --   HGB 14.8 15.0 15.1*  HCT 46.5* 46.8* 47.2*  MCV 93.0 91.8 92.7  PLT 340 345 357   Cardiac Enzymes: No results for input(s): CKTOTAL, CKMB, CKMBINDEX, TROPONINI in the last 168 hours. BNP: Invalid input(s): POCBNP CBG: Recent Labs  Lab 07/20/20 2257 07/21/20 0541 07/21/20 2318 07/22/20 0642  GLUCAP 105* 97 95 88   D-Dimer No results for input(s): DDIMER in the last 72 hours. Hgb A1c No results for input(s): HGBA1C in the last 72 hours. Lipid Profile No results for input(s): CHOL, HDL, LDLCALC, TRIG, CHOLHDL, LDLDIRECT in the last 72 hours. Thyroid function studies No results for input(s): TSH, T4TOTAL, T3FREE, THYROIDAB in the last 72 hours.  Invalid input(s): FREET3 Anemia work up No results for input(s): VITAMINB12, FOLATE, FERRITIN, TIBC, IRON, RETICCTPCT in the last 72 hours. Urinalysis    Component Value Date/Time   COLORURINE YELLOW (A) 07/19/2020 1448   APPEARANCEUR CLOUDY (A) 07/19/2020 1448   LABSPEC 1.027 07/19/2020 1448   PHURINE 7.0 07/19/2020 1448   GLUCOSEU NEGATIVE 07/19/2020 1448   HGBUR NEGATIVE 07/19/2020 1448   BILIRUBINUR NEGATIVE 07/19/2020 1448   KETONESUR NEGATIVE 07/19/2020 1448   PROTEINUR 30 (A) 07/19/2020 1448   NITRITE NEGATIVE 07/19/2020 1448   LEUKOCYTESUR LARGE (A) 07/19/2020 1448   Sepsis Labs Invalid input(s): PROCALCITONIN,  WBC,  LACTICIDVEN Microbiology Recent Results (from the past 240 hour(s))  Resp Panel by RT-PCR (Flu A&B, Covid) Nasopharyngeal Swab     Status: None   Collection Time: 07/19/20  3:16 PM   Specimen:  Nasopharyngeal Swab; Nasopharyngeal(NP) swabs in vial transport medium  Result Value Ref Range Status   SARS Coronavirus 2 by RT PCR  NEGATIVE NEGATIVE Final    Comment: (NOTE) SARS-CoV-2 target nucleic acids are NOT DETECTED.  The SARS-CoV-2 RNA is generally detectable in upper respiratory specimens during the acute phase of infection. The lowest concentration of SARS-CoV-2 viral copies this assay can detect is 138 copies/mL. A negative result does not preclude SARS-Cov-2 infection and should not be used as the sole basis for treatment or other patient management decisions. A negative result may occur with  improper specimen collection/handling, submission of specimen other than nasopharyngeal swab, presence of viral mutation(s) within the areas targeted by this assay, and inadequate number of viral copies(<138 copies/mL). A negative result must be combined with clinical observations, patient history, and epidemiological information. The expected result is Negative.  Fact Sheet for Patients:  EntrepreneurPulse.com.au  Fact Sheet for Healthcare Providers:  IncredibleEmployment.be  This test is no t yet approved or cleared by the [place] FDA and  has been authorized for detection and/or diagnosis of SARS-CoV-2 by FDA under an Emergency Use Authorization (EUA). This EUA will remain  in effect (meaning this test can be used) for the duration of the COVID-19 declaration under Section 564(b)(1) of the Act, 21 U.S.C.section 360bbb-3(b)(1), unless the authorization is terminated  or revoked sooner.       Influenza A by PCR NEGATIVE NEGATIVE Final   Influenza B by PCR NEGATIVE NEGATIVE Final    Comment: (NOTE) The Xpert Xpress SARS-CoV-2/FLU/RSV plus assay is intended as an aid in the diagnosis of influenza from Nasopharyngeal swab specimens and should not be used as a sole basis for treatment. Nasal washings and aspirates are unacceptable for Xpert Xpress SARS-CoV-2/FLU/RSV testing.  Fact Sheet for Patients: EntrepreneurPulse.com.au  Fact Sheet for  Healthcare Providers: IncredibleEmployment.be  This test is not yet approved or cleared by the [place] FDA and has been authorized for detection and/or diagnosis of SARS-CoV-2 by FDA under an Emergency Use Authorization (EUA). This EUA will remain in effect (meaning this test can be used) for the duration of the COVID-19 declaration under Section 564(b)(1) of the Act, 21 U.S.C. section 360bbb-3(b)(1), unless the authorization is terminated or revoked.  Performed at [facility], [address] [place], [place] [address]   Urine Culture     Status: None   Collection Time: 07/21/20  6:47 AM   Specimen: Urine, Random  Result Value Ref Range Status   Specimen Description   Final    URINE, RANDOM Performed at [facility], [address] [place], [place] [address]    Special Requests   Final    NONE Performed at [facility], [address], [place], [place] [address]    Culture   Final    NO GROWTH Performed at [facility], [place] [address] [place],  [address]    Report Status 07/23/2020 FINAL  Final     Time coordinating discharge: Over 30 minutes  SIGNED:   Nolberto Hanlon, MD  [facility] 07/27/2020, 11:59 AM Pager   If 7PM-7AM, please contact night-coverage [email or web address] Password [email or web address]

## 2020-07-27 NOTE — Progress Notes (Signed)
Manufacturing engineer hospital Liaison note:  Follow up visit made to new referral for [facility] hospice services at home. Patient noted to be resting. Writer spoke via telephone to patient's husband Edker to confirm DME has been delivered. He is in agreement with planned discharge home today via nonemergent transport via [facility], 3 pm time has been requested and TOC Awanya Caesar has been made aware. Staff RN Ashly updated. Patient to remain on 5 liters of oxygen. Referral updated to planned discharge. Thank you. Flo Shanks BSN, RN, [place] [phone]

## 2020-07-28 LAB — FUNGITELL, SERUM: Fungitell Result: <31 pg/mL (ref <80)

## 2020-08-03 ENCOUNTER — Inpatient Hospital Stay: Payer: PPO

## 2020-08-03 ENCOUNTER — Inpatient Hospital Stay: Payer: PPO | Admitting: Oncology

## 2020-08-06 ENCOUNTER — Other Ambulatory Visit: Payer: Self-pay | Admitting: *Deleted

## 2020-08-06 MED ORDER — ONDANSETRON HCL 8 MG PO TABS: 8.0000 mg | ORAL_TABLET | Freq: Three times a day (TID) | ORAL | 0 refills | Status: AC | PRN

## 2020-08-13 ENCOUNTER — Other Ambulatory Visit: Payer: Self-pay | Admitting: *Deleted

## 2020-08-13 MED ORDER — MORPHINE SULFATE (CONCENTRATE) 10 MG /0.5 ML PO SOLN: 5.0000 mg | ORAL | 0 refills | Status: DC | PRN

## 2020-08-16 ENCOUNTER — Other Ambulatory Visit: Payer: Self-pay | Admitting: Hospice and Palliative Medicine

## 2020-08-16 MED ORDER — FENTANYL 25 MCG/HR TD PT72
1.0000 | MEDICATED_PATCH | TRANSDERMAL | 0 refills | Status: DC
Start: 2020-08-16 — End: 2020-09-05

## 2020-08-16 MED ORDER — SENNA 8.8 MG/5ML PO LIQD: 5.0000 mL | Freq: Two times a day (BID) | ORAL | 3 refills | Status: AC | PRN

## 2020-08-16 NOTE — Progress Notes (Signed)
I received a call from patient's hospice nurse.  I then spoke with patient's husband by phone.  Husband asked for patient to be weaned from the fentanyl.  Apparently, patient is having constipation, likely opioid induced.  I discussed with him her regimen in detail.  Note that patient's symptoms have been poorly controlled when we have attempted to wean or discontinue fentanyl in the past.  However, husband would like [place] to wean fentanyl again.  We will send over new Rx for 25 mcg dose for 2 weeks and then can discontinue if desired.  We will start patient on senna elixir twice daily as needed in addition to her MiraLAX for constipation.

## 2020-08-31 ENCOUNTER — Other Ambulatory Visit: Payer: Self-pay | Admitting: Hospice and Palliative Medicine

## 2020-08-31 MED ORDER — MORPHINE SULFATE (CONCENTRATE) 10 MG /0.5 ML PO SOLN: 5.0000 mg | ORAL | 0 refills | Status: AC | PRN

## 2020-08-31 NOTE — Progress Notes (Signed)
I received a call from patient's hospice nurse with request to refill the morphine elixir.  PDMP reviewed.  Will send Rx to pharmacy.  Request was also made to liberalize her frequency to every 1-2 hours.  However, I asked that nurse again discuss with husband and his desire to wean the transdermal fentanyl.  If patient is needing more morphine, I would recommend against weaning transdermal fentanyl.  Nurse tells me that husband is giving lorazepam and Haldol at bedtime and this has been more effective for sleep and then use of trazodone.

## 2020-09-05 ENCOUNTER — Other Ambulatory Visit: Payer: Self-pay | Admitting: Hospice and Palliative Medicine

## 2020-09-05 MED ORDER — FENTANYL 25 MCG/HR TD PT72: 1.0000 | MEDICATED_PATCH | TRANSDERMAL | 0 refills | Status: AC

## 2020-09-05 NOTE — Progress Notes (Signed)
Received a call from patient's hospice nurse.  She saw patient at home today.  Patient is having agitation and restlessness at night.  This appears to have worsened since the fentanyl was dose reduced.  It is possible that patient is having more pain, particularly at bedtime.  Husband is not interested in going back up on the dose of the fentanyl at the present time.  However, he did agree to hold on further weaning of the fentanyl.  Instructions were given that he can utilize the morphine more frequently.  He is currently only using it every 4 hours.  Nurse will continue to monitor in the home.  Refill of fentanyl was requested.

## 2020-09-07 ENCOUNTER — Telehealth: Payer: Self-pay | Admitting: *Deleted

## 2020-09-07 MED ORDER — TRAZODONE HCL 50 MG PO TABS: 50.0000 mg | ORAL_TABLET | Freq: Every evening | ORAL | 3 refills | Status: AC | PRN

## 2020-09-07 NOTE — Telephone Encounter (Signed)
Ok to refill trazodone 50mg  [facility] PRN for sleep

## 2020-09-07 NOTE — Telephone Encounter (Signed)
Received request from [facility] for a 90 day refill of Trazodone which I do not see on her current medicine list. Please advise

## 2020-09-10 ENCOUNTER — Telehealth: Payer: Self-pay | Admitting: Hospice and Palliative Medicine

## 2020-09-10 NOTE — Telephone Encounter (Signed)
Received a call from patient's hospice nurse, April.  She says the patient has been having worse pain.  Patient was noted to be moaning in the background.  Nurse had given 2 doses of 5 mg more morphine elixir over the past 30 minutes.  I agreed that she could have an extra 5 mg in 15 minutes if needed.  Also agreed to liberalize the dose of oral morphine to 10-20 mg every hour as needed.  We also discussed utilization of lorazepam and Haldol as needed.  Nurses speaking with husband about possible transition to hospice [facility] for symptom management and end-of-life care.

## 2020-09-11 DIAGNOSIS — I10 Essential (primary) hypertension: Secondary | ICD-10-CM | POA: Diagnosis not present

## 2020-09-11 DIAGNOSIS — R404 Transient alteration of awareness: Secondary | ICD-10-CM | POA: Diagnosis not present

## 2020-09-11 DIAGNOSIS — M2550 Pain in unspecified joint: Secondary | ICD-10-CM | POA: Diagnosis not present

## 2020-09-11 DIAGNOSIS — Z7401 Bed confinement status: Secondary | ICD-10-CM | POA: Diagnosis not present

## 2020-10-16 DEATH — deceased
